# Patient Record
Sex: Male | Born: 1940 | Race: White | Hispanic: No | Marital: Married | Smoking: Former smoker
Health system: Southern US, Community
[De-identification: ages and names within clinical notes are randomized; demographics above are authoritative.]

## PROBLEM LIST (undated history)

## (undated) DIAGNOSIS — Z973 Presence of spectacles and contact lenses: Secondary | ICD-10-CM

## (undated) DIAGNOSIS — R911 Solitary pulmonary nodule: Secondary | ICD-10-CM

## (undated) DIAGNOSIS — C801 Malignant (primary) neoplasm, unspecified: Secondary | ICD-10-CM

## (undated) DIAGNOSIS — I255 Ischemic cardiomyopathy: Secondary | ICD-10-CM

## (undated) DIAGNOSIS — M719 Bursopathy, unspecified: Secondary | ICD-10-CM

## (undated) DIAGNOSIS — Z720 Tobacco use: Secondary | ICD-10-CM

## (undated) DIAGNOSIS — J449 Chronic obstructive pulmonary disease, unspecified: Secondary | ICD-10-CM

## (undated) DIAGNOSIS — R001 Bradycardia, unspecified: Secondary | ICD-10-CM

## (undated) DIAGNOSIS — I779 Disorder of arteries and arterioles, unspecified: Secondary | ICD-10-CM

## (undated) DIAGNOSIS — E7800 Pure hypercholesterolemia, unspecified: Secondary | ICD-10-CM

## (undated) DIAGNOSIS — C3490 Malignant neoplasm of unspecified part of unspecified bronchus or lung: Secondary | ICD-10-CM

## (undated) DIAGNOSIS — L409 Psoriasis, unspecified: Secondary | ICD-10-CM

## (undated) DIAGNOSIS — I2510 Atherosclerotic heart disease of native coronary artery without angina pectoris: Secondary | ICD-10-CM

## (undated) HISTORY — DX: Tobacco use: Z72.0

## (undated) HISTORY — DX: Bursopathy, unspecified: M71.9

## (undated) HISTORY — DX: Psoriasis, unspecified: L40.9

## (undated) HISTORY — PX: COLONOSCOPY: SHX174

## (undated) HISTORY — PX: HERNIA REPAIR: SHX51

## (undated) MED FILL — Dexamethasone Sodium Phosphate Inj 100 MG/10ML: INTRAMUSCULAR | Qty: 1 | Status: AC

---

## 2005-09-26 ENCOUNTER — Ambulatory Visit (HOSPITAL_BASED_OUTPATIENT_CLINIC_OR_DEPARTMENT_OTHER): Admission: RE | Admit: 2005-09-26 | Discharge: 2005-09-26 | Payer: Self-pay | Admitting: Orthopedic Surgery

## 2014-07-19 ENCOUNTER — Other Ambulatory Visit: Payer: Self-pay | Admitting: Dermatology

## 2016-07-21 DIAGNOSIS — R0689 Other abnormalities of breathing: Secondary | ICD-10-CM | POA: Insufficient documentation

## 2019-08-31 ENCOUNTER — Institutional Professional Consult (permissible substitution): Payer: Self-pay | Admitting: Critical Care Medicine

## 2019-08-31 ENCOUNTER — Telehealth: Payer: Self-pay | Admitting: Pulmonary Disease

## 2019-08-31 NOTE — Telephone Encounter (Signed)
Spoke with the pt's spouse  She is calling as FYI to let us know that her daughter, Gerald [name], already gave Dr Valeta Harms pt's disc with his cxr on it  Pt's appt is 09/07/19

## 2019-09-05 NOTE — Telephone Encounter (Signed)
PCCM:  Patients daughter is my neighbor. She left her dad's CD in my mailbox at home. I have the disk.   Thanks  Garner Nash, DO [facility] 09/05/2019 8:12 AM

## 2019-09-07 ENCOUNTER — Ambulatory Visit (INDEPENDENT_AMBULATORY_CARE_PROVIDER_SITE_OTHER): Payer: Medicare Other | Admitting: Pulmonary Disease

## 2019-09-07 ENCOUNTER — Encounter: Payer: Self-pay | Admitting: Pulmonary Disease

## 2019-09-07 ENCOUNTER — Ambulatory Visit
Admission: RE | Admit: 2019-09-07 | Discharge: 2019-09-07 | Disposition: A | Discharge status: Home / Self Care | Payer: Self-pay | Source: Ambulatory Visit | Attending: Pulmonary Disease | Admitting: Pulmonary Disease

## 2019-09-07 ENCOUNTER — Other Ambulatory Visit: Payer: Self-pay

## 2019-09-07 VITALS — BP 122/82 | HR 74 | Temp 97.3°F | Ht 72.0 in | Wt 186.4 lb

## 2019-09-07 DIAGNOSIS — R911 Solitary pulmonary nodule: Secondary | ICD-10-CM

## 2019-09-07 DIAGNOSIS — J432 Centrilobular emphysema: Secondary | ICD-10-CM | POA: Diagnosis not present

## 2019-09-07 DIAGNOSIS — F17200 Nicotine dependence, unspecified, uncomplicated: Secondary | ICD-10-CM

## 2019-09-07 DIAGNOSIS — T17500A Unspecified foreign body in bronchus causing asphyxiation, initial encounter: Secondary | ICD-10-CM

## 2019-09-07 DIAGNOSIS — J9809 Other diseases of bronchus, not elsewhere classified: Secondary | ICD-10-CM | POA: Diagnosis not present

## 2019-09-07 DIAGNOSIS — J449 Chronic obstructive pulmonary disease, unspecified: Secondary | ICD-10-CM

## 2019-09-07 DIAGNOSIS — J90 Pleural effusion, not elsewhere classified: Secondary | ICD-10-CM

## 2019-09-07 NOTE — Patient Instructions (Addendum)
Thank you for visiting Dr. Valeta Harms at [facility]. Today we recommend the following: Orders Placed This Encounter  Procedures  . NM PET Image Initial (PI) Skull Base To Thigh   Samples of Stiolto today. Once we have your PET scan complete we will make a decision on pursuing bronchoscopy. Likely to be scheduled later on this month for video bronchoscopy with navigation and fiducial placement.  Return in about 6 weeks (around 10/19/2019) for with APP or Dr. Valeta Harms.  You must quit smoking or vaping. This is the single most important thing that you can do to improve your lung health.   S = Set a quit date. T = Tell family, friends, and the people around you that you plan to quit. A = Anticipate or plan ahead for the tough times you'll face while quitting. R = Remove cigarettes and other tobacco products from your home, car, and work T = Talk to [place] about getting help to quit  If you need help feel free to reach out to our office, [facility]: [phone], call 1-800-QUIT-NOW, or visit [email or web address]    Please do your part to reduce the spread of COVID-19.

## 2019-09-07 NOTE — Addendum Note (Signed)
Addended byCoralie Keens on: 09/07/2019 05:30 PM   Modules accepted: Orders

## 2019-09-07 NOTE — Progress Notes (Signed)
Synopsis: Referred in June 2021 for upper lobe pulmonary nodule by Mosetta Anis, MD  Subjective:   PATIENT ID: Gerald [name] GENDER: male DOB: [birth year], MRN: [id number]  Chief Complaint  Patient presents with  . Consult    lung nodule    This is an 79 year old gentleman with a past medical history of hypertension, longstanding tobacco abuse history.  Patient underwent CT chest completed at [facility] in [place].  There was a 13 mm right upper lobe spiculated pulmonary nodule concerning for malignancy.  Also found to have a moderate right-sided pleural effusion with lower lobe atelectasis and possible right lower lobe endobronchial mucous plugging versus endobronchial lesion.  Decision was made for referral to pulmonary for further evaluation.  This CAT scan was originally completed on 07/28/2019.  OV 09/07/2019: Patient only complains today of cough.  He has this regularly.  Unfortunately he is still smoking.  2 packs/day for greater than the past 40+ years.  Brother recently diagnosed with lung cancer.  Patient denies hemoptysis and has at least a 20 pound weight loss over the past few months.  Past Medical History:  Diagnosis Date  . Bursitis   . Psoriasis   . Tobacco abuse      Family History  Problem Relation Age of Onset  . Lung cancer Brother      History reviewed. No pertinent surgical history.  Social History   Socioeconomic History  . Marital status: Married    Spouse name: Not on file  . Number of children: Not on file  . Years of education: Not on file  . Highest education level: Not on file  Occupational History  . Not on file  Tobacco Use  . Smoking status: Current Every Day Smoker    Packs/day: 2.00    Types: Cigarettes  . Smokeless tobacco: Current User  Substance and Sexual Activity  . Alcohol use: Not on file  . Drug use: Not on file  . Sexual activity: Not on file  Other Topics Concern  . Not on file  Social History Narrative  .  Not on file   Social Determinants of Health   Financial Resource Strain:   . Difficulty of Paying Living Expenses:   Food Insecurity:   . Worried About Charity fundraiser in the Last Year:   . Arboriculturist in the Last Year:   Transportation Needs:   . Film/video editor (Medical):   Marland Kitchen Lack of Transportation (Non-Medical):   Physical Activity:   . Days of Exercise per Week:   . Minutes of Exercise per Session:   Stress:   . Feeling of Stress :   Social Connections:   . Frequency of Communication with Friends and Family:   . Frequency of Social Gatherings with Friends and Family:   . Attends Religious Services:   . Active Member of Clubs or Organizations:   . Attends Archivist Meetings:   Marland Kitchen Marital Status:   Intimate Partner Violence:   . Fear of Current or Ex-Partner:   . Emotionally Abused:   Marland Kitchen Physically Abused:   . Sexually Abused:      Not on File   Outpatient Medications Prior to Visit  Medication Sig Dispense Refill  . omega-3 acid ethyl esters (LOVAZA) 1 g capsule     . Omega-3 Fatty Acids (FISH OIL) 1000 MG CAPS Take by mouth.    . pravastatin (PRAVACHOL) 20 MG tablet Take by mouth.    Marland Kitchen  albuterol (VENTOLIN HFA) 108 (90 Base) MCG/ACT inhaler Inhale into the lungs.    Marland Kitchen amoxicillin-clavulanate (AUGMENTIN) 875-125 MG tablet     . aspirin 81 MG chewable tablet Chew by mouth.    . nystatin-triamcinolone (MYCOLOG II) cream      No facility-administered medications prior to visit.    Review of Systems  Constitutional: Positive for weight loss. Negative for chills, fever and malaise/fatigue.  HENT: Negative for hearing loss, sore throat and tinnitus.   Eyes: Negative for blurred vision and double vision.  Respiratory: Positive for cough. Negative for hemoptysis, sputum production, shortness of breath, wheezing and stridor.   Cardiovascular: Negative for chest pain, palpitations, orthopnea, leg swelling and PND.  Gastrointestinal: Negative for  abdominal pain, constipation, diarrhea, heartburn, nausea and vomiting.  Genitourinary: Negative for dysuria, hematuria and urgency.  Musculoskeletal: Negative for joint pain and myalgias.  Skin: Negative for itching and rash.  Neurological: Negative for dizziness, tingling, weakness and headaches.  Endo/Heme/Allergies: Negative for environmental allergies. Does not bruise/bleed easily.  Psychiatric/Behavioral: Negative for depression. The patient is not nervous/anxious and does not have insomnia.   All other systems reviewed and are negative.    Objective:  Physical Exam Vitals reviewed.  Constitutional:      General: He is not in acute distress.    Appearance: He is well-developed.  HENT:     Head: Normocephalic and atraumatic.     Mouth/Throat:     Pharynx: No oropharyngeal exudate.  Eyes:     Conjunctiva/sclera: Conjunctivae normal.     Pupils: Pupils are equal, round, and reactive to light.  Neck:     Vascular: No JVD.     Trachea: No tracheal deviation.     Comments: Loss of supraclavicular fat Cardiovascular:     Rate and Rhythm: Normal rate and regular rhythm.     Heart sounds: S1 normal and S2 normal.     Comments: Distant heart tones Pulmonary:     Effort: No tachypnea or accessory muscle usage.     Breath sounds: No stridor. Decreased breath sounds (throughout all lung fields) present. No wheezing, rhonchi or rales.     Comments: diminshed right base  Abdominal:     General: Bowel sounds are normal. There is no distension.     Palpations: Abdomen is soft.     Tenderness: There is no abdominal tenderness.  Musculoskeletal:        General: No deformity (muscle wasting ).  Skin:    General: Skin is warm and dry.     Capillary Refill: Capillary refill takes less than 2 seconds.     Findings: No rash.  Neurological:     Mental Status: He is alert and oriented to person, place, and time.  Psychiatric:        Behavior: Behavior normal.      Vitals:    09/07/19 1437  BP: 122/82  Pulse: 74  Temp: (!) 97.3 F (36.3 C)  TempSrc: Oral  SpO2: 97%  Weight: 186 lb 6.4 oz (84.6 kg)  Height: 6' (1.829 m)   97% on RA BMI Readings from Last 3 Encounters:  09/07/19 25.28 kg/m   Wt Readings from Last 3 Encounters:  09/07/19 186 lb 6.4 oz (84.6 kg)     CBC No results found for: WBC, RBC, HGB, HCT, PLT, MCV, MCH, MCHC, RDW, LYMPHSABS, MONOABS, EOSABS, BASOSABS   Chest Imaging: CT chest 07/25/2019: Completed at [facility] [place] [facility]. Images reviewed on disc brought to the office today.  Patient has 30 mm right upper lobe pulmonary spiculated nodule concerning for primary bronchogenic carcinoma. There is also a moderate right-sided pleural effusion with right lower lobe atelectasis and possible right lower lobe endobronchial lesion versus mucous plugging. The patient's images have been independently reviewed by me.    Media Information   Document Information  Photos  Ct from [facility] Right upper lobe pulmonary nodule   09/07/2019 14:13  Attached To:  Office Visit on 09/07/19 with Stevie Ertle, Octavio Graves, DO  Source Information  Otha Monical, Octavio Graves, DO  [facility]    The patient's images have been independently reviewed by me.     Pulmonary Functions Testing Results: No flowsheet data found.  FeNO: None   Pathology: none  Echocardiogram: none   Heart Catheterization: none     Assessment & Plan:     ICD-10-CM   1. Nodule of upper lobe of right lung  R91.1 NM PET Image Initial (PI) Skull Base To Thigh  2. Centrilobular emphysema ([place])  J43.2   3. Current smoker  F17.200   4. Mucus plugging of bronchi  J98.09   5. Chronic obstructive pulmonary disease, unspecified COPD type ([place])  J44.9   6. Pleural effusion on right  J90     Discussion:  This is an 79 year old gentleman longstanding tobacco abuse history greater than 80-pack-year history.  Patient found to have an incidental right upper lobe spiculated 13  mm pulmonary nodule with associated centrilobular emphysema concerning for primary bronchogenic carcinoma.  Also incidentally found there was a right moderate-sized pleural effusion on CT scan with right lower lobe mucous plugging versus endobronchial lesion.  Office-based bedside procedure: Bedside chest ultrasound completed today in the office with a small amount of right-sided pleural fluid present.  This appeared free-flowing and not enough to consider thoracentesis at this time. Images were stored in office based butterfly imaging software cloud system.  Plan: Nuclear medicine pet imaging to be completed as soon as possible. Pending the pet image if there is a lesion within the right lower lobe will need to consider bronchoscopy. As for the right upper lobe lesion this is concerning for primary bronchogenic carcinoma.  I suspect this will be PET avid. Pending the PET scan if PET avid would proceed with video bronchoscopy with navigation and fiducial placement for consideration of SBRT. Patient is a current smoker and has no desire to quit smoking.  Therefore is not a surgical candidate. Today in the office we discussed risk benefits and alternatives of proceeding with procedure. They accepted this risk at this time. Therefore after PET scan images are complete we will make recommendations regarding video bronchoscopy. Tentative bronchoscopy dates could be for June 22 or June 29 at [facility] endoscopy.     Current Outpatient Medications:  .  omega-3 acid ethyl esters (LOVAZA) 1 g capsule, , Disp: , Rfl:  .  Omega-3 Fatty Acids (FISH OIL) 1000 MG CAPS, Take by mouth., Disp: , Rfl:  .  pravastatin (PRAVACHOL) 20 MG tablet, Take by mouth., Disp: , Rfl:  .  albuterol (VENTOLIN HFA) 108 (90 Base) MCG/ACT inhaler, Inhale into the lungs., Disp: , Rfl:    Garner Nash, DO [facility] 09/07/2019 3:49 PM

## 2019-09-07 NOTE — H&P (View-Only) (Signed)
Synopsis: Referred in June 2021 for upper lobe pulmonary nodule by Mosetta Anis, MD  Subjective:   PATIENT ID: Gerald [name] GENDER: male DOB: Jul 23, 1940, MRN: [id number]  Chief Complaint  Patient presents with  . Consult    lung nodule    This is an 79 year old gentleman with a past medical history of hypertension, longstanding tobacco abuse history.  Patient underwent CT chest completed at [facility] in [place].  There was a 13 mm right upper lobe spiculated pulmonary nodule concerning for malignancy.  Also found to have a moderate right-sided pleural effusion with lower lobe atelectasis and possible right lower lobe endobronchial mucous plugging versus endobronchial lesion.  Decision was made for referral to pulmonary for further evaluation.  This CAT scan was originally completed on 07/28/2019.  OV 09/07/2019: Patient only complains today of cough.  He has this regularly.  Unfortunately he is still smoking.  2 packs/day for greater than the past 40+ years.  Brother recently diagnosed with lung cancer.  Patient denies hemoptysis and has at least a 20 pound weight loss over the past few months.  Past Medical History:  Diagnosis Date  . Bursitis   . Psoriasis   . Tobacco abuse      Family History  Problem Relation Age of Onset  . Lung cancer Brother      History reviewed. No pertinent surgical history.  Social History   Socioeconomic History  . Marital status: Married    Spouse name: Not on file  . Number of children: Not on file  . Years of education: Not on file  . Highest education level: Not on file  Occupational History  . Not on file  Tobacco Use  . Smoking status: Current Every Day Smoker    Packs/day: 2.00    Types: Cigarettes  . Smokeless tobacco: Current User  Substance and Sexual Activity  . Alcohol use: Not on file  . Drug use: Not on file  . Sexual activity: Not on file  Other Topics Concern  . Not on file  Social History Narrative  .  Not on file   Social Determinants of Health   Financial Resource Strain:   . Difficulty of Paying Living Expenses:   Food Insecurity:   . Worried About Charity fundraiser in the Last Year:   . Arboriculturist in the Last Year:   Transportation Needs:   . Film/video editor (Medical):   Marland Kitchen Lack of Transportation (Non-Medical):   Physical Activity:   . Days of Exercise per Week:   . Minutes of Exercise per Session:   Stress:   . Feeling of Stress :   Social Connections:   . Frequency of Communication with Friends and Family:   . Frequency of Social Gatherings with Friends and Family:   . Attends Religious Services:   . Active Member of Clubs or Organizations:   . Attends Archivist Meetings:   Marland Kitchen Marital Status:   Intimate Partner Violence:   . Fear of Current or Ex-Partner:   . Emotionally Abused:   Marland Kitchen Physically Abused:   . Sexually Abused:      Not on File   Outpatient Medications Prior to Visit  Medication Sig Dispense Refill  . omega-3 acid ethyl esters (LOVAZA) 1 g capsule     . Omega-3 Fatty Acids (FISH OIL) 1000 MG CAPS Take by mouth.    . pravastatin (PRAVACHOL) 20 MG tablet Take by mouth.    Marland Kitchen  albuterol (VENTOLIN HFA) 108 (90 Base) MCG/ACT inhaler Inhale into the lungs.    Marland Kitchen amoxicillin-clavulanate (AUGMENTIN) 875-125 MG tablet     . aspirin 81 MG chewable tablet Chew by mouth.    . nystatin-triamcinolone (MYCOLOG II) cream      No facility-administered medications prior to visit.    Review of Systems  Constitutional: Positive for weight loss. Negative for chills, fever and malaise/fatigue.  HENT: Negative for hearing loss, sore throat and tinnitus.   Eyes: Negative for blurred vision and double vision.  Respiratory: Positive for cough. Negative for hemoptysis, sputum production, shortness of breath, wheezing and stridor.   Cardiovascular: Negative for chest pain, palpitations, orthopnea, leg swelling and PND.  Gastrointestinal: Negative for  abdominal pain, constipation, diarrhea, heartburn, nausea and vomiting.  Genitourinary: Negative for dysuria, hematuria and urgency.  Musculoskeletal: Negative for joint pain and myalgias.  Skin: Negative for itching and rash.  Neurological: Negative for dizziness, tingling, weakness and headaches.  Endo/Heme/Allergies: Negative for environmental allergies. Does not bruise/bleed easily.  Psychiatric/Behavioral: Negative for depression. The patient is not nervous/anxious and does not have insomnia.   All other systems reviewed and are negative.    Objective:  Physical Exam Vitals reviewed.  Constitutional:      General: He is not in acute distress.    Appearance: He is well-developed.  HENT:     Head: Normocephalic and atraumatic.     Mouth/Throat:     Pharynx: No oropharyngeal exudate.  Eyes:     Conjunctiva/sclera: Conjunctivae normal.     Pupils: Pupils are equal, round, and reactive to light.  Neck:     Vascular: No JVD.     Trachea: No tracheal deviation.     Comments: Loss of supraclavicular fat Cardiovascular:     Rate and Rhythm: Normal rate and regular rhythm.     Heart sounds: S1 normal and S2 normal.     Comments: Distant heart tones Pulmonary:     Effort: No tachypnea or accessory muscle usage.     Breath sounds: No stridor. Decreased breath sounds (throughout all lung fields) present. No wheezing, rhonchi or rales.     Comments: diminshed right base  Abdominal:     General: Bowel sounds are normal. There is no distension.     Palpations: Abdomen is soft.     Tenderness: There is no abdominal tenderness.  Musculoskeletal:        General: No deformity (muscle wasting ).  Skin:    General: Skin is warm and dry.     Capillary Refill: Capillary refill takes less than 2 seconds.     Findings: No rash.  Neurological:     Mental Status: He is alert and oriented to person, place, and time.  Psychiatric:        Behavior: Behavior normal.      Vitals:    09/07/19 1437  BP: 122/82  Pulse: 74  Temp: (!) 97.3 F (36.3 C)  TempSrc: Oral  SpO2: 97%  Weight: 186 lb 6.4 oz (84.6 kg)  Height: 6' (1.829 m)   97% on RA BMI Readings from Last 3 Encounters:  09/07/19 25.28 kg/m   Wt Readings from Last 3 Encounters:  09/07/19 186 lb 6.4 oz (84.6 kg)     CBC No results found for: WBC, RBC, HGB, HCT, PLT, MCV, MCH, MCHC, RDW, LYMPHSABS, MONOABS, EOSABS, BASOSABS   Chest Imaging: CT chest 07/25/2019: Completed at [facility] [place] [facility]. Images reviewed on disc brought to the office today.  Patient has 30 mm right upper lobe pulmonary spiculated nodule concerning for primary bronchogenic carcinoma. There is also a moderate right-sided pleural effusion with right lower lobe atelectasis and possible right lower lobe endobronchial lesion versus mucous plugging. The patient's images have been independently reviewed by me.    Media Information   Document Information  Photos  Ct from [facility] Right upper lobe pulmonary nodule   09/07/2019 14:13  Attached To:  Office Visit on 09/07/19 with Gaylon Melchor, Octavio Graves, DO  Source Information  Tyquavious Gamel, Octavio Graves, DO  [facility]    The patient's images have been independently reviewed by me.     Pulmonary Functions Testing Results: No flowsheet data found.  FeNO: None   Pathology: none  Echocardiogram: none   Heart Catheterization: none     Assessment & Plan:     ICD-10-CM   1. Nodule of upper lobe of right lung  R91.1 NM PET Image Initial (PI) Skull Base To Thigh  2. Centrilobular emphysema ([place])  J43.2   3. Current smoker  F17.200   4. Mucus plugging of bronchi  J98.09   5. Chronic obstructive pulmonary disease, unspecified COPD type ([place])  J44.9   6. Pleural effusion on right  J90     Discussion:  This is an 79 year old gentleman longstanding tobacco abuse history greater than 80-pack-year history.  Patient found to have an incidental right upper lobe spiculated 13  mm pulmonary nodule with associated centrilobular emphysema concerning for primary bronchogenic carcinoma.  Also incidentally found there was a right moderate-sized pleural effusion on CT scan with right lower lobe mucous plugging versus endobronchial lesion.  Office-based bedside procedure: Bedside chest ultrasound completed today in the office with a small amount of right-sided pleural fluid present.  This appeared free-flowing and not enough to consider thoracentesis at this time. Images were stored in office based butterfly imaging software cloud system.  Plan: Nuclear medicine pet imaging to be completed as soon as possible. Pending the pet image if there is a lesion within the right lower lobe will need to consider bronchoscopy. As for the right upper lobe lesion this is concerning for primary bronchogenic carcinoma.  I suspect this will be PET avid. Pending the PET scan if PET avid would proceed with video bronchoscopy with navigation and fiducial placement for consideration of SBRT. Patient is a current smoker and has no desire to quit smoking.  Therefore is not a surgical candidate. Today in the office we discussed risk benefits and alternatives of proceeding with procedure. They accepted this risk at this time. Therefore after PET scan images are complete we will make recommendations regarding video bronchoscopy. Tentative bronchoscopy dates could be for June 22 or June 29 at [facility] endoscopy.     Current Outpatient Medications:  .  omega-3 acid ethyl esters (LOVAZA) 1 g capsule, , Disp: , Rfl:  .  Omega-3 Fatty Acids (FISH OIL) 1000 MG CAPS, Take by mouth., Disp: , Rfl:  .  pravastatin (PRAVACHOL) 20 MG tablet, Take by mouth., Disp: , Rfl:  .  albuterol (VENTOLIN HFA) 108 (90 Base) MCG/ACT inhaler, Inhale into the lungs., Disp: , Rfl:    Garner Nash, DO [facility] 09/07/2019 3:49 PM

## 2019-09-12 ENCOUNTER — Other Ambulatory Visit: Payer: Self-pay

## 2019-09-12 ENCOUNTER — Telehealth: Payer: Self-pay | Admitting: Pulmonary Disease

## 2019-09-12 ENCOUNTER — Ambulatory Visit (HOSPITAL_COMMUNITY)
Admission: RE | Admit: 2019-09-12 | Discharge: 2019-09-12 | Disposition: A | Discharge status: Home / Self Care | Payer: Medicare Other | Source: Ambulatory Visit | Attending: Pulmonary Disease | Admitting: Pulmonary Disease

## 2019-09-12 DIAGNOSIS — J439 Emphysema, unspecified: Secondary | ICD-10-CM | POA: Diagnosis not present

## 2019-09-12 DIAGNOSIS — E041 Nontoxic single thyroid nodule: Secondary | ICD-10-CM | POA: Diagnosis not present

## 2019-09-12 DIAGNOSIS — R911 Solitary pulmonary nodule: Secondary | ICD-10-CM

## 2019-09-12 DIAGNOSIS — I700 Atherosclerosis of aorta: Secondary | ICD-10-CM | POA: Insufficient documentation

## 2019-09-12 DIAGNOSIS — I2510 Atherosclerotic heart disease of native coronary artery without angina pectoris: Secondary | ICD-10-CM | POA: Diagnosis not present

## 2019-09-12 DIAGNOSIS — I77810 Thoracic aortic ectasia: Secondary | ICD-10-CM | POA: Diagnosis not present

## 2019-09-12 LAB — GLUCOSE, CAPILLARY: Glucose-Capillary: 83 mg/dL (ref 70–99)

## 2019-09-12 MED ORDER — FLUDEOXYGLUCOSE F - 18 (FDG) INJECTION
9.4000 | Freq: Once | INTRAVENOUS | Status: AC | PRN
  Administered 2019-09-12: 9.4 via INTRAVENOUS

## 2019-09-12 NOTE — Telephone Encounter (Signed)
PCCM:  Called and discussed nuclear medicine pet imaging results with patient's daughter.  Attempted to get a hold of the patient however they are currently driving back to their home in [place].  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  We will follow through with the plan set forth in the office visit the other day.  Super D CT imaging will need to complete   Also plan for navigational bronchoscopy and fiducial placement.  Plan for SBRT.  Bronchoscopy possibly 09/27/2019  Garner Nash, DO [facility] 09/12/2019 6:08 PM

## 2019-09-13 ENCOUNTER — Telehealth: Payer: Self-pay | Admitting: Pulmonary Disease

## 2019-09-13 NOTE — Telephone Encounter (Signed)
Pt has been notified of the following:  Super D CT at [facility] on 6/24 @ 4:00 COVID Test 6/26 @ 11:45 - [place] Test Site BNB-6/29 @ 7:30 AM  Pt would like to know if it is possible to get COVID test done on date of procedure.  I explained that this needs to done 3 days prior.  BI - is there anything that you can do to help the pt get sched on the date of the procedure d/t the distance?

## 2019-09-13 NOTE — Telephone Encounter (Signed)
PCCM:  I called and spoke with the patient and wife.  Pre-op orders placed and labs   All questions answered.   Garner Nash, DO [facility] 09/13/2019 12:50 PM

## 2019-09-14 NOTE — Telephone Encounter (Signed)
Super D orders placed by Dr Valeta Harms.  Bronchoscopy scheduled for 09/27/19.  Nothing further at this time.

## 2019-09-22 ENCOUNTER — Other Ambulatory Visit: Payer: Self-pay

## 2019-09-22 ENCOUNTER — Ambulatory Visit (HOSPITAL_COMMUNITY)
Admission: RE | Admit: 2019-09-22 | Discharge: 2019-09-22 | Disposition: A | Discharge status: Home / Self Care | Payer: Medicare Other | Source: Ambulatory Visit | Attending: Pulmonary Disease | Admitting: Pulmonary Disease

## 2019-09-22 DIAGNOSIS — R911 Solitary pulmonary nodule: Secondary | ICD-10-CM | POA: Diagnosis not present

## 2019-09-23 ENCOUNTER — Encounter (HOSPITAL_COMMUNITY): Payer: Self-pay | Admitting: Pulmonary Disease

## 2019-09-24 ENCOUNTER — Other Ambulatory Visit (HOSPITAL_COMMUNITY)
Admission: RE | Admit: 2019-09-24 | Discharge: 2019-09-24 | Disposition: A | Discharge status: Home / Self Care | Payer: Medicare Other | Source: Ambulatory Visit | Attending: Pulmonary Disease | Admitting: Pulmonary Disease

## 2019-09-24 DIAGNOSIS — Z01812 Encounter for preprocedural laboratory examination: Secondary | ICD-10-CM | POA: Diagnosis present

## 2019-09-24 DIAGNOSIS — Z20822 Contact with and (suspected) exposure to covid-19: Secondary | ICD-10-CM | POA: Insufficient documentation

## 2019-09-24 LAB — SARS CORONAVIRUS 2 (TAT 6-24 HRS): SARS Coronavirus 2: NEGATIVE

## 2019-09-26 ENCOUNTER — Encounter (HOSPITAL_COMMUNITY): Payer: Self-pay | Admitting: Pulmonary Disease

## 2019-09-26 ENCOUNTER — Other Ambulatory Visit: Payer: Self-pay

## 2019-09-26 NOTE — Anesthesia Preprocedure Evaluation (Addendum)
Anesthesia Evaluation  Patient identified by MRN, date of birth, ID band Patient awake    Reviewed: Allergy & Precautions, NPO status , Patient's Chart, lab work & pertinent test results  Airway Mallampati: II  TM Distance: >3 FB Neck ROM: Full    Dental  (+) Teeth Intact, Dental Advisory Given   Pulmonary COPD, Current Smoker,  LUNG NODULE RIGHT UPPER LOBE   Pulmonary exam normal breath sounds clear to auscultation       Cardiovascular + Peripheral Vascular Disease   Rhythm:Regular Rate:Bradycardia     Neuro/Psych negative neurological ROS  negative psych ROS   GI/Hepatic negative GI ROS, Neg liver ROS,   Endo/Other  negative endocrine ROS  Renal/GU negative Renal ROS     Musculoskeletal negative musculoskeletal ROS (+)   Abdominal   Peds  Hematology negative hematology ROS (+)   Anesthesia Other Findings Day of surgery medications reviewed with the patient.  Reproductive/Obstetrics                             Anesthesia Physical Anesthesia Plan  ASA: III  Anesthesia Plan: General   Post-op Pain Management:    Induction: Intravenous  PONV Risk Score and Plan: 1 and Ondansetron and Treatment may vary due to age or medical condition  Airway Management Planned: Oral ETT and LMA  Additional Equipment:   Intra-op Plan:   Post-operative Plan: Extubation in OR  Informed Consent: I have reviewed the patients History and Physical, chart, labs and discussed the procedure including the risks, benefits and alternatives for the proposed anesthesia with the patient or authorized representative who has indicated his/her understanding and acceptance.     Dental advisory given  Plan Discussed with: CRNA  Anesthesia Plan Comments: (PAT note written 09/26/2019 by Myra Gianotti, PA-C. SAME DAY WORK-UP   )       Anesthesia Quick Evaluation

## 2019-09-26 NOTE — Progress Notes (Signed)
Anesthesia Chart Review: SAME DAY WORK-UP   Case: [id number] Date/Time: 09/27/19 0730   Procedure: VIDEO BRONCHOSCOPY WITH ENDOBRONCHIAL NAVIGATION (N/A )   Anesthesia type: General   Pre-op diagnosis: LUNG NODULE RIGHT UPPER LOBE   Location: [place] [facility]   Surgeons: Garner Nash, DO      DISCUSSION: Patient is an 79 year old male scheduled for the above procedure. He has a hypermetabolic RUL pulmonary nodule. 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 Stable loculated right pleural effusion noted on 09/22/19 chest CT. (At 09/07/19 visit with Dr. Valeta Harms, he performed bedside chest [place] and noted "free-flowing" pleural fluid and "not enough to consider thoracentesis at this time."  History includes smoking, lung nodule, COPD, hypercholesterolemia, psoriasis, carotid artery stenosis (stable 62-69% LICA stenosis, < 48% RICA 10/25/18).  09/24/19 presurgical COVID-19 test negative. He is a same day work-up, so he is for labs and anesthesia team evaluation on the day of surgery. If no EKG within past year then would also plan for EKG on arrival.    VS: On 09/07/19, BP 122/82, HR 74, WT 84.6 KG    PROVIDERS: Olam Idler, MD is PCP ([place]) Dorrene German, MD is vascular surgeon. ([place]). It appears that next follow-up visit is scheduled for 11/01/19.    LABS: For day of surgery. As of 07/21/19, H/H 16.7/51.1, PLT 207K, glucose 78, AST 18, ALT 9, Cr 0.96.    IMAGES: CT Super D Chest 09/22/19 (ordered by June Leap, DO): IMPRESSION: 1. Stable appearance of spiculated, FDG avid nodule within the right upper lobe suspicious for primary bronchogenic carcinoma. 2. Stable loculated right pleural effusion with overlying areas of rounded atelectasis. 3. Emphysema and aortic atherosclerosis. 4. Left main and 3 vessel coronary artery calcifications noted. 5. Gallstones. 6. Right  lobe of thyroid gland nodule measures 4 cm. Recommend thyroid US (ref: J Am Coll Radiol. 2015 Feb;12(2): 143-50).  PET Scan 09/12/19 (ordered by June Leap, DO): IMPRESSION: 1. Hypermetabolic slightly spiculated solid 1.5 cm right upper lobe pulmonary nodule, compatible with primary bronchogenic carcinoma. 2. No hypermetabolic thoracic adenopathy or distant metastatic disease. 3. Right thyroid 4.1 cm partially calcified non hypermetabolic nodule. Recommend thyroid US (ref: J Am Coll Radiol. 2015 Feb;12(2): 143-50). 4. 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 Recommend annual imaging followup by CTA or MRA. This recommendation follows 2010 ACCF/AHA/AATS/ACR/ASA/SCA/SCAI/SIR/STS/SVM Guidelines for the Diagnosis and Management of Patients with Thoracic Aortic Disease. Circulation. 2010; 121: N462-V035. Aortic aneurysm NOS (ICD10-I71.9). 5. Aortic Atherosclerosis (ICD10-I70.0) and Emphysema (ICD10-J43.9).   EKG: No EKG seen in [facility] or Care Everywhere.   CV: Carotid US 10/25/18 ([facility]): Previous: Previous exam performed on 10/15/2017 demonstrated a 60-79% left ICA stenosis with maximum velocities of193.80 cm/s PSV/58.93 cm/s EDV.  Right: No significant focal increase in ICA velocities.  Left: Moderate focal increase in ICA velocities with post stenotic turbulence.  Conclusions: RIGHT: No hemodynamically significant ICA stenosis, consistent with <60%.  LEFT: Moderate ICA stenosis, consistent with 60-79%.  Compared to the previous exam, these results remain essentially unchanged.   Stress Echo 03/23/12 ([place]): Conclusion: Normal wall motion response to exercise.            Negative for ischemia by ST criteria.  Disposition:  Limited by dyspnea likely c/w history of continued smoking.     Past Medical History:  Diagnosis Date  . Bursitis   . Carotid artery disease ([place])   .  COPD (chronic obstructive pulmonary disease) ([place])   . Hypercholesterolemia    . Lung nodule   . Psoriasis   . Tobacco abuse   . Wears glasses    reading    Past Surgical History:  Procedure Laterality Date  . COLONOSCOPY    . HERNIA REPAIR      MEDICATIONS: No current facility-administered medications for this encounter.   Marland Kitchen acitretin (SORIATANE) 25 MG capsule  . albuterol (VENTOLIN HFA) 108 (90 Base) MCG/ACT inhaler  . clobetasol cream (TEMOVATE) 0.05 %  . Multiple Vitamins-Minerals (ICAPS) TABS  . Omega-3 Fatty Acids (FISH OIL) 1000 MG CAPS  . pravastatin (PRAVACHOL) 20 MG tablet    Myra Gianotti, PA-C Surgical Short Stay/Anesthesiology [facility] Phone [phone] [facility] Phone [phone] 09/26/2019 1:22 PM

## 2019-09-26 NOTE — Progress Notes (Signed)
Pt denies SOB, chest pain, and being under the care of a cardiologist. Pt stated that PCP is Dr. Mosetta Anis.  Pt denies having a cardiac cath. Pt denies having an EKG. Pt denies recent labs. Pt requested that spouse, Darlene (DPR), complete the pre-op assessment. Spouse made aware to have pt stop taking Aspirin (unless otherwise advised by surgeon), vitamins, fish oil and herbal medications. Do not take any NSAIDs ie: Ibuprofen, Advil, Naproxen (Aleve), Motrin, BC and Goody Powder. Spouse reminded to have pt quarantine. Spouse verbalized understanding of all pre-op instructions. PA, Anesthesiology, asked to review pt history; see note.

## 2019-09-27 ENCOUNTER — Ambulatory Visit (HOSPITAL_COMMUNITY): Payer: Medicare Other

## 2019-09-27 ENCOUNTER — Ambulatory Visit (HOSPITAL_COMMUNITY): Payer: Medicare Other | Admitting: Vascular Surgery

## 2019-09-27 ENCOUNTER — Ambulatory Visit (HOSPITAL_COMMUNITY)
Admission: RE | Admit: 2019-09-27 | Discharge: 2019-09-27 | Disposition: A | Discharge status: Home / Self Care | Payer: Medicare Other | Attending: Pulmonary Disease | Admitting: Pulmonary Disease

## 2019-09-27 ENCOUNTER — Encounter (HOSPITAL_COMMUNITY)
Admission: RE | Disposition: A | Discharge status: Home / Self Care | Payer: Self-pay | Source: Home / Self Care | Attending: Pulmonary Disease

## 2019-09-27 ENCOUNTER — Encounter (HOSPITAL_COMMUNITY): Payer: Self-pay | Admitting: Pulmonary Disease

## 2019-09-27 DIAGNOSIS — F17210 Nicotine dependence, cigarettes, uncomplicated: Secondary | ICD-10-CM | POA: Insufficient documentation

## 2019-09-27 DIAGNOSIS — Z79899 Other long term (current) drug therapy: Secondary | ICD-10-CM | POA: Insufficient documentation

## 2019-09-27 DIAGNOSIS — I2584 Coronary atherosclerosis due to calcified coronary lesion: Secondary | ICD-10-CM | POA: Diagnosis not present

## 2019-09-27 DIAGNOSIS — I739 Peripheral vascular disease, unspecified: Secondary | ICD-10-CM | POA: Diagnosis not present

## 2019-09-27 DIAGNOSIS — C3431 Malignant neoplasm of lower lobe, right bronchus or lung: Secondary | ICD-10-CM | POA: Insufficient documentation

## 2019-09-27 DIAGNOSIS — Z792 Long term (current) use of antibiotics: Secondary | ICD-10-CM | POA: Insufficient documentation

## 2019-09-27 DIAGNOSIS — J9809 Other diseases of bronchus, not elsewhere classified: Secondary | ICD-10-CM | POA: Insufficient documentation

## 2019-09-27 DIAGNOSIS — J432 Centrilobular emphysema: Secondary | ICD-10-CM | POA: Diagnosis not present

## 2019-09-27 DIAGNOSIS — L409 Psoriasis, unspecified: Secondary | ICD-10-CM | POA: Insufficient documentation

## 2019-09-27 DIAGNOSIS — R911 Solitary pulmonary nodule: Secondary | ICD-10-CM

## 2019-09-27 DIAGNOSIS — Z9889 Other specified postprocedural states: Secondary | ICD-10-CM

## 2019-09-27 DIAGNOSIS — J90 Pleural effusion, not elsewhere classified: Secondary | ICD-10-CM | POA: Diagnosis not present

## 2019-09-27 DIAGNOSIS — I2510 Atherosclerotic heart disease of native coronary artery without angina pectoris: Secondary | ICD-10-CM | POA: Insufficient documentation

## 2019-09-27 DIAGNOSIS — Z801 Family history of malignant neoplasm of trachea, bronchus and lung: Secondary | ICD-10-CM | POA: Diagnosis not present

## 2019-09-27 DIAGNOSIS — C3411 Malignant neoplasm of upper lobe, right bronchus or lung: Secondary | ICD-10-CM | POA: Diagnosis not present

## 2019-09-27 DIAGNOSIS — E7800 Pure hypercholesterolemia, unspecified: Secondary | ICD-10-CM | POA: Diagnosis not present

## 2019-09-27 DIAGNOSIS — I10 Essential (primary) hypertension: Secondary | ICD-10-CM | POA: Insufficient documentation

## 2019-09-27 DIAGNOSIS — Z7982 Long term (current) use of aspirin: Secondary | ICD-10-CM | POA: Insufficient documentation

## 2019-09-27 HISTORY — PX: BRONCHIAL BRUSHINGS: SHX5108

## 2019-09-27 HISTORY — DX: Pure hypercholesterolemia, unspecified: E78.00

## 2019-09-27 HISTORY — DX: Chronic obstructive pulmonary disease, unspecified: J44.9

## 2019-09-27 HISTORY — DX: Presence of spectacles and contact lenses: Z97.3

## 2019-09-27 HISTORY — PX: HEMOSTASIS CONTROL: SHX6838

## 2019-09-27 HISTORY — PX: BRONCHIAL NEEDLE ASPIRATION BIOPSY: SHX5106

## 2019-09-27 HISTORY — PX: CRYOTHERAPY: SHX6894

## 2019-09-27 HISTORY — DX: Disorder of arteries and arterioles, unspecified: I77.9

## 2019-09-27 HISTORY — PX: FIDUCIAL MARKER PLACEMENT: SHX6858

## 2019-09-27 HISTORY — PX: VIDEO BRONCHOSCOPY WITH ENDOBRONCHIAL NAVIGATION: SHX6175

## 2019-09-27 HISTORY — DX: Solitary pulmonary nodule: R91.1

## 2019-09-27 HISTORY — PX: BRONCHIAL BIOPSY: SHX5109

## 2019-09-27 HISTORY — PX: BRONCHIAL WASHINGS: SHX5105

## 2019-09-27 LAB — COMPREHENSIVE METABOLIC PANEL
ALT: 11 U/L (ref 0–44)
AST: 14 U/L — ABNORMAL LOW (ref 15–41)
Albumin: 3.5 g/dL (ref 3.5–5.0)
Alkaline Phosphatase: 60 U/L (ref 38–126)
Anion gap: 9 (ref 5–15)
BUN: 14 mg/dL (ref 8–23)
CO2: 29 mmol/L (ref 22–32)
Calcium: 9.1 mg/dL (ref 8.9–10.3)
Chloride: 100 mmol/L (ref 98–111)
Creatinine, Ser: 1.11 mg/dL (ref 0.61–1.24)
GFR calc Af Amer: >60 mL/min (ref >60)
GFR calc non Af Amer: >60 mL/min (ref >60)
Glucose, Bld: 104 mg/dL — ABNORMAL HIGH (ref 70–99)
Potassium: 4.1 mmol/L (ref 3.5–5.1)
Sodium: 138 mmol/L (ref 135–145)
Total Bilirubin: 0.7 mg/dL (ref 0.3–1.2)
Total Protein: 6.5 g/dL (ref 6.5–8.1)

## 2019-09-27 LAB — CBC
HCT: 53.7 % — ABNORMAL HIGH (ref 39.0–52.0)
Hemoglobin: 17.3 g/dL — ABNORMAL HIGH (ref 13.0–17.0)
MCH: 31 pg (ref 26.0–34.0)
MCHC: 32.2 g/dL (ref 30.0–36.0)
MCV: 96.2 fL (ref 80.0–100.0)
Platelets: 180 10*3/uL (ref 150–400)
RBC: 5.58 MIL/uL (ref 4.22–5.81)
RDW: 13.6 % (ref 11.5–15.5)
WBC: 8 10*3/uL (ref 4.0–10.5)
nRBC: 0 % (ref 0.0–0.2)

## 2019-09-27 LAB — APTT: aPTT: 32 seconds (ref 24–36)

## 2019-09-27 LAB — PROTIME-INR
INR: 1 (ref 0.8–1.2)
Prothrombin Time: 12.7 seconds (ref 11.4–15.2)

## 2019-09-27 SURGERY — VIDEO BRONCHOSCOPY WITH ENDOBRONCHIAL NAVIGATION
Anesthesia: General

## 2019-09-27 MED ORDER — FENTANYL CITRATE (PF) 100 MCG/2ML IJ SOLN: 25.0000 ug | INTRAMUSCULAR | Status: DC | PRN

## 2019-09-27 MED ORDER — ONDANSETRON HCL 4 MG/2ML IJ SOLN
INTRAMUSCULAR | Status: DC | PRN
  Administered 2019-09-27: 4 mg via INTRAVENOUS

## 2019-09-27 MED ORDER — GLYCOPYRROLATE PF 0.2 MG/ML IJ SOSY
PREFILLED_SYRINGE | INTRAMUSCULAR | Status: DC | PRN
  Administered 2019-09-27: .2 mg via INTRAVENOUS

## 2019-09-27 MED ORDER — LIDOCAINE 2% (20 MG/ML) 5 ML SYRINGE
INTRAMUSCULAR | Status: DC | PRN
  Administered 2019-09-27: 60 mg via INTRAVENOUS
  Administered 2019-09-27: 80 mg via INTRAVENOUS

## 2019-09-27 MED ORDER — EPINEPHRINE PF 1 MG/ML IJ SOLN: INTRAMUSCULAR | Status: DC | PRN

## 2019-09-27 MED ORDER — PHENYLEPHRINE HCL (PRESSORS) 10 MG/ML IV SOLN
INTRAVENOUS | Status: DC | PRN
Start: 2019-09-27 — End: 2019-09-27
  Administered 2019-09-27: 120 ug via INTRAVENOUS

## 2019-09-27 MED ORDER — ONDANSETRON HCL 4 MG/2ML IJ SOLN: 4.0000 mg | Freq: Once | INTRAMUSCULAR | Status: DC | PRN

## 2019-09-27 MED ORDER — SUGAMMADEX SODIUM 200 MG/2ML IV SOLN
INTRAVENOUS | Status: DC | PRN
Start: 2019-09-27 — End: 2019-09-27
  Administered 2019-09-27: 200 mg via INTRAVENOUS

## 2019-09-27 MED ORDER — SODIUM CHLORIDE (PF) 0.9 % IJ SOLN
PREFILLED_SYRINGE | INTRAMUSCULAR | Status: DC | PRN
  Administered 2019-09-27: 2 mL

## 2019-09-27 MED ORDER — PHENYLEPHRINE HCL (PRESSORS) 10 MG/ML IV SOLN
INTRAVENOUS | Status: DC | PRN
Start: 2019-09-27 — End: 2019-09-27
  Administered 2019-09-27: 40 ug via INTRAVENOUS
  Administered 2019-09-27: 80 ug via INTRAVENOUS

## 2019-09-27 MED ORDER — DEXAMETHASONE SODIUM PHOSPHATE 10 MG/ML IJ SOLN
INTRAMUSCULAR | Status: DC | PRN
Start: 2019-09-27 — End: 2019-09-27
  Administered 2019-09-27: 5 mg via INTRAVENOUS

## 2019-09-27 MED ORDER — PHENYLEPHRINE HCL-NACL 10-0.9 MG/250ML-% IV SOLN
INTRAVENOUS | Status: DC | PRN
Start: 2019-09-27 — End: 2019-09-27
  Administered 2019-09-27: 25 ug/min via INTRAVENOUS

## 2019-09-27 MED ORDER — PROPOFOL 10 MG/ML IV BOLUS
INTRAVENOUS | Status: DC | PRN
  Administered 2019-09-27: 100 mg via INTRAVENOUS

## 2019-09-27 MED ORDER — FENTANYL CITRATE (PF) 100 MCG/2ML IJ SOLN
INTRAMUSCULAR | Status: DC | PRN
  Administered 2019-09-27 (×3): 50 ug via INTRAVENOUS

## 2019-09-27 MED ORDER — LACTATED RINGERS IV SOLN: INTRAVENOUS | Status: DC | PRN

## 2019-09-27 MED ORDER — ROCURONIUM BROMIDE 10 MG/ML (PF) SYRINGE
PREFILLED_SYRINGE | INTRAVENOUS | Status: DC | PRN
  Administered 2019-09-27: 10 mg via INTRAVENOUS
  Administered 2019-09-27: 60 mg via INTRAVENOUS

## 2019-09-27 SURGICAL SUPPLY — 48 items
ADAPTER BRONCH F/PENTAX (ADAPTER) ×4 IMPLANT
ADAPTER VALVE BIOPSY EBUS (MISCELLANEOUS) IMPLANT
ADPR BSCP EDG PNTX (ADAPTER) ×2
ADPTR VALVE BIOPSY EBUS (MISCELLANEOUS)
BRUSH CYTOL CELLEBRITY 1.5X140 (MISCELLANEOUS) ×4 IMPLANT
BRUSH SUPERTRAX BIOPSY (INSTRUMENTS) IMPLANT
BRUSH SUPERTRAX NDL-TIP CYTO (INSTRUMENTS) ×4 IMPLANT
CANISTER SUCT 3000ML PPV (MISCELLANEOUS) ×4 IMPLANT
CHANNEL WORK EXTEND EDGE 180 (KITS) IMPLANT
CHANNEL WORK EXTEND EDGE 45 (KITS) IMPLANT
CHANNEL WORK EXTEND EDGE 90 (KITS) IMPLANT
CONT SPEC 4OZ CLIKSEAL STRL BL (MISCELLANEOUS) ×4 IMPLANT
COVER BACK TABLE 60X90IN (DRAPES) ×4 IMPLANT
FILTER STRAW FLUID ASPIR (MISCELLANEOUS) IMPLANT
FORCEPS BIOP SUPERTRX PREMAR (INSTRUMENTS) ×4 IMPLANT
GAUZE SPONGE 4X4 12PLY STRL (GAUZE/BANDAGES/DRESSINGS) ×4 IMPLANT
GLOVE SURG SS PI 7.5 STRL IVOR (GLOVE) ×8 IMPLANT
GOWN STRL REUS W/ TWL LRG LVL3 (GOWN DISPOSABLE) ×4 IMPLANT
GOWN STRL REUS W/TWL LRG LVL3 (GOWN DISPOSABLE) ×8
KIT CLEAN ENDO COMPLIANCE (KITS) ×4 IMPLANT
KIT LOCATABLE GUIDE (CANNULA) IMPLANT
KIT MARKER FIDUCIAL DELIVERY (KITS) IMPLANT
KIT PROCEDURE EDGE 180 (KITS) IMPLANT
KIT PROCEDURE EDGE 45 (KITS) IMPLANT
KIT PROCEDURE EDGE 90 (KITS) IMPLANT
KIT TURNOVER KIT B (KITS) ×4 IMPLANT
MARKER SKIN DUAL TIP RULER LAB (MISCELLANEOUS) ×4 IMPLANT
NDL SUPERTRX PREMARK BIOPSY (NEEDLE) ×2 IMPLANT
NEEDLE SUPERTRX PREMARK BIOPSY (NEEDLE) ×4 IMPLANT
NS IRRIG 1000ML POUR BTL (IV SOLUTION) ×4 IMPLANT
OIL SILICONE PENTAX (PARTS (SERVICE/REPAIRS)) ×4 IMPLANT
PAD ARMBOARD 7.5X6 YLW CONV (MISCELLANEOUS) ×8 IMPLANT
PATCHES PATIENT (LABEL) ×12 IMPLANT
SOL ANTI FOG 6CC (MISCELLANEOUS) ×2 IMPLANT
SOLUTION ANTI FOG 6CC (MISCELLANEOUS) ×2
SYR 20CC LL (SYRINGE) ×4 IMPLANT
SYR 20ML ECCENTRIC (SYRINGE) ×4 IMPLANT
SYR 50ML SLIP (SYRINGE) ×4 IMPLANT
TOWEL OR 17X24 6PK STRL BLUE (TOWEL DISPOSABLE) ×4 IMPLANT
TRAP SPECIMEN MUCOUS 40CC (MISCELLANEOUS) IMPLANT
TUBE CONNECTING 20'X1/4 (TUBING) ×1
TUBE CONNECTING 20X1/4 (TUBING) ×3 IMPLANT
UNDERPAD 30X30 (UNDERPADS AND DIAPERS) ×4 IMPLANT
VALVE BIOPSY  SINGLE USE (MISCELLANEOUS) ×4
VALVE BIOPSY SINGLE USE (MISCELLANEOUS) ×2 IMPLANT
VALVE SUCTION BRONCHIO DISP (MISCELLANEOUS) ×4 IMPLANT
WATER STERILE IRR 1000ML POUR (IV SOLUTION) ×4 IMPLANT
superlock fiducial marker ×6 IMPLANT

## 2019-09-27 NOTE — Anesthesia Postprocedure Evaluation (Signed)
Anesthesia Post Note  Patient: Ezel Vallone  Procedure(s) Performed: VIDEO BRONCHOSCOPY WITH ENDOBRONCHIAL NAVIGATION (N/A ) BRONCHIAL BRUSHINGS BRONCHIAL NEEDLE ASPIRATION BIOPSIES BRONCHIAL BIOPSIES FIDUCIAL MARKER PLACEMENT BRONCHIAL WASHINGS CRYOTHERAPY     Patient location during evaluation: Endoscopy Anesthesia Type: General Level of consciousness: awake and alert Pain management: pain level controlled Vital Signs Assessment: post-procedure vital signs reviewed and stable Respiratory status: spontaneous breathing, nonlabored ventilation, respiratory function stable and patient connected to nasal cannula oxygen Cardiovascular status: blood pressure returned to baseline and stable Postop Assessment: no apparent nausea or vomiting Anesthetic complications: no   No complications documented.  Last Vitals:  Vitals:   09/27/19 1015 09/27/19 1030  BP: (!) 106/55 116/60  Pulse: 61 73  Resp: 16 20  Temp:  (!) 36.1 C  SpO2: 100% 100%    Last Pain:  Vitals:   09/27/19 1030  PainSc: 0-No pain                 Catalina Gravel

## 2019-09-27 NOTE — Transfer of Care (Signed)
Immediate Anesthesia Transfer of Care Note  Patient: Gerald [name]  Procedure(s) Performed: VIDEO BRONCHOSCOPY WITH ENDOBRONCHIAL NAVIGATION (N/A ) BRONCHIAL BRUSHINGS BRONCHIAL NEEDLE ASPIRATION BIOPSIES BRONCHIAL BIOPSIES FIDUCIAL MARKER PLACEMENT BRONCHIAL WASHINGS CRYOTHERAPY  Patient Location: PACU  Anesthesia Type:General  Level of Consciousness: awake, alert  and oriented  Airway & Oxygen Therapy: Patient Spontanous Breathing and Patient connected to face mask oxygen  Post-op Assessment: Report given to RN, Post -op Vital signs reviewed and stable and Patient moving all extremities X 4  Post vital signs: Reviewed and stable  Last Vitals:  Vitals Value Taken Time  BP 115/75 09/27/19 0935  Temp    Pulse 76 09/27/19 0935  Resp 48 09/27/19 0935  SpO2 97 % 09/27/19 0935  Vitals shown include unvalidated device data.  Last Pain:  Vitals:   09/27/19 0655  PainSc: 0-No pain      Patients Stated Pain Goal: 3 (16/10/96 0454)  Complications: No complications documented.

## 2019-09-27 NOTE — Progress Notes (Signed)
Dr Gifford Shave informed patient's pulse 36 per monitor, counted pulse 39.  EKG obtained.  Dr Gifford Shave reviewed.  No order given.  [place] for surgery.

## 2019-09-27 NOTE — Interval H&P Note (Signed)
History and Physical Interval Note:  09/27/2019 7:04 AM  Gerald [name]  has presented today for surgery, with the diagnosis of LUNG NODULE RIGHT UPPER LOBE.  The various methods of treatment have been discussed with the patient and family. After consideration of risks, benefits and other options for treatment, the patient has consented to  Procedure(s): [place] (N/A) as a surgical intervention.  The patient's history has been reviewed, patient examined, no change in status, stable for surgery.  I have reviewed the patient's chart and labs.  Questions were answered to the patient's satisfaction.    Patient seen and examined in pre-op. Discussed risks, benefits and alternatives to procedure. Discussed risks of bleeding, pneumothorax and death. No barriers to proceed.   [name]

## 2019-09-27 NOTE — Anesthesia Procedure Notes (Signed)
Procedure Name: Intubation Date/Time: 09/27/2019 7:39 AM Performed by: Neldon Newport, CRNA Pre-anesthesia Checklist: Patient identified, Emergency Drugs available, Suction available, Patient being monitored and Timeout performed Patient Re-evaluated:Patient Re-evaluated prior to induction Oxygen Delivery Method: Circle system utilized Preoxygenation: Pre-oxygenation with 100% oxygen Induction Type: IV induction Ventilation: Mask ventilation without difficulty Laryngoscope Size: Mac and 4 Grade View: Grade II Tube type: Oral Tube size: 8.5 mm Number of attempts: 1 Placement Confirmation: ETT inserted through vocal cords under direct vision,  positive ETCO2 and breath sounds checked- equal and bilateral Secured at: 23 cm Tube secured with: Tape Dental Injury: Teeth and Oropharynx as per pre-operative assessment

## 2019-09-27 NOTE — Op Note (Addendum)
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  Estimated Blood Loss: Minimal, <2BJ   Complications: None   Indications and History: Gerald [name] is a 79 y.o. 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  The risks, benefits, complications, treatment options and expected outcomes were discussed with the patient.  The possibilities of pneumothorax, pneumonia, reaction to medication, pulmonary aspiration, perforation of a viscus, bleeding, failure to diagnose a condition and creating a complication requiring transfusion or operation were discussed with the patient who freely signed the consent.    Description of Procedure: The patient was seen in the Preoperative Area, was examined and was deemed appropriate to proceed.  The patient was taken to [facility] endoscopy room 2, identified as Gerald [name] and the procedure verified as Flexible Video Fiberoptic Bronchoscopy.  A Time Out was held and the above information confirmed.   Prior to the date of the procedure a high-resolution CT scan of the chest was performed. Utilizing [place] a virtual tracheobronchial tree was generated to allow the creation of distinct navigation pathways to the patient's parenchymal abnormalities. After being taken to the operating room general anesthesia was initiated and the patient  was orally intubated. 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  The bronchoscope was used for therapeutic suctioning and clearance of the right mainstem mucous plugging. The extendable working channel and locator guide were introduced into the bronchoscope. The distinct navigation pathways prepared prior to this procedure were then utilized to navigate to within 0.8 cm of patient's lesion(s) identified on CT scan.  A full fluoroscopic sweep was obtained using fluoroscopy, imaging from 25 degrees RAO to 25 degrees LAO during a inspiratory breath-hold APL at 25 cm of water. The extendable working channel was secured into place and the locator guide was withdrawn. Under fluoroscopic guidance transbronchial needle brushings, transbronchial Wang needle biopsies, and transbronchial forceps biopsies were performed to be sent for cytology and pathology. A bronchioalveolar lavage was performed in the right upper lobe and sent for cytology.  We then used the fiducial placement guide to place 3 gold fiducials and 3 separate axial planes at approximately 3 cm from the lesion. At the end of the procedure a general airway inspection was performed and there was no evidence of active bleeding.   At the termination of the navigational procedure the therapeutic bronchoscope was reintroduced into the airway and 2 cc of 1-20,000 dilution of epinephrine was sprayed on the right lower lobe endobronchial tumor.  Using a 1.7 mm erbe cryoprobe endobronchial cryo biopsies were obtained from the opening of the superior segment right lower lobe.  We then used a 1.7 mm cryoprobe to complete 32nd freeze thaw cycles along the medial wall of the superior segment of the right lower lobe as well as the bifurcation into the lower  lobe subsegment.  During this process a few pieces  of tumor were loosened and extracted to clear the opening of the superior segment with [facility] [employer] 2.4 mm forceps.  A BAL was completed along with bronchial washings of the right lower lobe to be sent for cytology.  Bronchoscope was brought to just above the main carina and there was no evidence of active bleeding.   The bronchoscope was removed.  The patient tolerated the procedure well. There was no significant blood loss and there were no obvious complications. A post-procedural chest x-ray is pending.  Samples: 1. Transbronchial needle brushings from RUL 2. Transbronchial Wang needle biopsies from RUL 3. Transbronchial forceps biopsies from RUL 4. Bronchoalveolar lavage from RUL 5. Endobronchial biopsies from RLL 6. BAL RLL  Plans:  The patient will be discharged from the PACU to home when recovered from anesthesia and after chest x-ray is reviewed. We will review the cytology, pathology with the patient when they become available. Outpatient followup will be with Garner Nash, DO.   Garner Nash, DO [facility] 09/27/2019 9:43 AM

## 2019-09-27 NOTE — Discharge Instructions (Signed)
Flexible Bronchoscopy, Care After This sheet gives you information about how to care for yourself after your test. Your doctor may also give you more specific instructions. If you have problems or questions, contact your doctor. Follow these instructions at home: Eating and drinking  The day after the test, go back to your normal diet. Driving  Do not drive for 24 hours if you were given a medicine to help you relax (sedative).  Do not drive or use heavy machinery while taking prescription pain medicine. General instructions   Take over-the-counter and prescription medicines only as told by your doctor.  Return to your normal activities as told. Ask what activities are safe for you.  Do not use any products that have nicotine or tobacco in them. This includes cigarettes and e-cigarettes. If you need help quitting, ask your doctor.  Keep all follow-up visits as told by your doctor. This is important. It is very important if you had a tissue sample (biopsy) taken. Get help right away if:  You have shortness of breath that gets worse.  You get light-headed.  You feel like you are going to pass out (faint).  You have chest pain.  You cough up: ? More than a little blood. ? More blood than before. Summary  Do not eat or drink anything (not even water) for 2 hours after your test, or until your numbing medicine wears off.  Do not use cigarettes. Do not use e-cigarettes.  Get help right away if you have chest pain. This information is not intended to replace advice given to you by your health care provider. Make sure you discuss any questions you have with your health care provider. Document Revised: 02/27/2017 Document Reviewed: 04/04/2016 Elsevier Patient Education  2020 [employer].

## 2019-09-28 ENCOUNTER — Encounter (HOSPITAL_COMMUNITY): Payer: Self-pay | Admitting: Pulmonary Disease

## 2019-09-28 LAB — CYTOLOGY - NON PAP

## 2019-09-28 LAB — SURGICAL PATHOLOGY

## 2019-09-29 ENCOUNTER — Telehealth: Payer: Self-pay | Admitting: Pulmonary Disease

## 2019-09-29 DIAGNOSIS — C3491 Malignant neoplasm of unspecified part of right bronchus or lung: Secondary | ICD-10-CM

## 2019-09-29 NOTE — Telephone Encounter (Signed)
PCCM:  I called patient an informed them of the pathology results.   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 Vs second primary.   Thanks  Garner Nash, DO [place] 09/29/2019 5:48 PM

## 2019-09-30 ENCOUNTER — Telehealth: Payer: Self-pay | Admitting: *Deleted

## 2019-09-30 ENCOUNTER — Encounter: Payer: Self-pay | Admitting: *Deleted

## 2019-09-30 NOTE — Telephone Encounter (Signed)
I called patient to update him that we have received the referral and working on appt.

## 2019-09-30 NOTE — Progress Notes (Signed)
I received referral today on Gerald [name].  Referral to Rad Onc completed.  I notified Rad Onc scheduling of referral.

## 2019-10-04 ENCOUNTER — Encounter: Payer: Self-pay | Admitting: *Deleted

## 2019-10-04 DIAGNOSIS — R911 Solitary pulmonary nodule: Secondary | ICD-10-CM

## 2019-10-04 NOTE — Progress Notes (Signed)
I received referral on Gerald [name].  I updated Dr. Julien Nordmann and he would like to see patient on 10/14/19.  I updated new patient coordinator to call and schedule on this day.

## 2019-10-05 ENCOUNTER — Telehealth: Payer: Self-pay | Admitting: Internal Medicine

## 2019-10-05 NOTE — Telephone Encounter (Signed)
Received a new pt referral from Dr. Valeta Harms for new dx of lung cancer. Gerald [name] has been cld and scheduled to see Dr. Julien Nordmann on 7/16 at 9am w/labs at 52am. Appt date and time has been given to the pt's wife.

## 2019-10-06 ENCOUNTER — Other Ambulatory Visit: Payer: Self-pay | Admitting: *Deleted

## 2019-10-06 NOTE — Progress Notes (Signed)
The proposed treatment discussed in cancer conference 10/06/19 is for discussion purpose only and is not a binding recommendation.  The patient was not physically examined nor present for their treatment options.  Therefore, final treatment plans cannot be decided.

## 2019-10-10 NOTE — Progress Notes (Signed)
Radiation Oncology         [phone] ________________________________  Initial Outpatient Consultation  Name: Gerald [name] MRN: [id number]  Date: 10/12/2019  DOB: 1941/03/16  MW:UXLKG, Gerald Docker, MD  Gerald Nash, DO   REFERRING PHYSICIAN: Garner Nash, DO  DIAGNOSIS: Diagnoses of Primary cancer of right upper lobe of lung ([place]) and Primary cancer of right lower lobe of lung ([place]) were pertinent to this visit.  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 male who is seen as a courtesy of Dr. Valeta [name] for an opinion concerning radiation therapy as part of management for his recently diagnosed lung cancer. Today, he is accompanied by his daughter. The patient presented to Dr. Mosetta [name], PCP, on 07/21/2019 with complaint of cough. Chest x-ray on that day showed a new abnormal findings of the right lower lung with pleural effusion and likely atelectasis changes versus consolidation. CT scan of chest performed at [facility] in [place] on 07/28/2019 showed a 13 mm right upper lobe spiculated pulmonary nodule that was concerning for malignancy. There was also noted to be a moderate right-sided pleural effusion with lower lobe atelectasis and possible right lower lobe endobronchial mucous plugging versus endobronchial lesion.  The patient was referred to Dr. Valeta [name] and was seen in consultation on 09/07/2019. At that time, it was recommended that the patient proceed with PET scan followed by bronchoscopy. Given that the patient is a current smoker and has no desire to quite, he is therefore not a surgical candidate.  PET scan on 09/12/2019 showed a hypermetabolic slightly spiculated solid 1.5 cm right upper lobe pulmonary nodule that was compatible with primary bronchogenic carcinoma. There was also noted to be a 4.1 cm right thyroid partially calcified  non-hypermetabolic nodule. There was no hypermetabolic thoracic adenopathy or distant metabolic disease.  Super chest CT scan on 09/22/2019 showed a stable appearance of the spiculated, FDG avid nodule within the right upper lobe that was suspicious for primary bronchogenic carcinoma. The lobulated right pleural effusion with overlying areas of rounded atelectasis was also stable. It also showed emphysema, aortic atherosclerosis, left main and 3 vessel coronary artery calcifications, gallstones, and a right thyroid gland nodule that measured 4 cm.  The patient underwent a a flexible video fiberoptic bronchoscopy with electromagnetic navigation and biopsies on 06/029/2021 that was performed by Dr. Valeta [name]. Pathology from the procedure revealed squamous cell carcinoma of the right upper lobe and right lower lobe. Cytology from the procedure revealed malignant cells consistent with squamous cell carcinoma of the right upper lobe brushing and atypical cells of the right upper lobe fine needle aspiration.  The Patient was noted on this procedure to have an endobronchial tumor in the superior segment of the right lower lobe(right lower lobe superior segment endobronchial tumor arising from the bifurcation and extending into the medial wall)  This right lower Lesion was not visible on PET scan or  recent CT scan  PREVIOUS RADIATION THERAPY: No  PAST MEDICAL HISTORY:  Past Medical History:  Diagnosis Date   Bursitis    Carotid artery disease (HCC)    COPD (chronic obstructive pulmonary disease) ([place])    Hypercholesterolemia    Lung nodule    Psoriasis    Tobacco abuse    Wears glasses    reading    PAST SURGICAL HISTORY: Past Surgical History:  Procedure Laterality Date  BRONCHIAL BIOPSY  09/27/2019   Procedure: BRONCHIAL BIOPSIES;  Surgeon: Gerald Nash, DO;  Location: [facility];  Service: Pulmonary;;   BRONCHIAL BRUSHINGS  09/27/2019   Procedure: BRONCHIAL BRUSHINGS;  Surgeon:  Gerald Nash, DO;  Location: [facility];  Service: Pulmonary;;   BRONCHIAL NEEDLE ASPIRATION BIOPSY  09/27/2019   Procedure: BRONCHIAL NEEDLE ASPIRATION BIOPSIES;  Surgeon: Gerald Nash, DO;  Location: [facility];  Service: Pulmonary;;   BRONCHIAL WASHINGS  09/27/2019   Procedure: BRONCHIAL WASHINGS;  Surgeon: Gerald Nash, DO;  Location: [place] [facility];  Service: Pulmonary;;   COLONOSCOPY     CRYOTHERAPY  09/27/2019   Procedure: Gerald [name];  Surgeon: Gerald Nash, DO;  Location: [facility];  Service: Pulmonary;;   FIDUCIAL MARKER PLACEMENT  09/27/2019   Procedure: FIDUCIAL MARKER PLACEMENT;  Surgeon: Gerald Nash, DO;  Location: [facility];  Service: Pulmonary;;   HEMOSTASIS CONTROL  09/27/2019   Procedure: HEMOSTASIS CONTROL;  Surgeon: Gerald Nash, DO;  Location: [facility];  Service: Pulmonary;;   HERNIA REPAIR     VIDEO BRONCHOSCOPY WITH ENDOBRONCHIAL NAVIGATION N/A 09/27/2019   Procedure: VIDEO BRONCHOSCOPY WITH ENDOBRONCHIAL NAVIGATION;  Surgeon: Gerald Nash, DO;  Location: [facility];  Service: Pulmonary;  Laterality: N/A;    FAMILY HISTORY:  Family History  Problem Relation Age of Onset   Lung cancer Brother     SOCIAL HISTORY:  Social History   Tobacco Use   Smoking status: Current Every Day Smoker    Packs/day: 2.00    Types: Cigarettes   Smokeless tobacco: Never Used  Vaping Use   Vaping Use: Never used  Substance Use Topics   Alcohol use: Yes    Comment: social   Drug use: Never    ALLERGIES: No Known Allergies  MEDICATIONS:  Current Outpatient Medications  Medication Sig Dispense Refill   acitretin (SORIATANE) 25 MG capsule Take 25 mg by mouth daily.     clobetasol cream (TEMOVATE) 2.37 % Apply 1 application topically daily as needed (Psoriasis).     Multiple Vitamins-Minerals (ICAPS) TABS Take 1 tablet by mouth in the morning and at bedtime. IREDS      Omega-3 Fatty Acids (FISH OIL) 1000 MG CAPS Take  1,000 mg by mouth daily.      pravastatin (PRAVACHOL) 20 MG tablet Take 20 mg by mouth every evening.      albuterol (VENTOLIN HFA) 108 (90 Base) MCG/ACT inhaler Inhale 2 puffs into the lungs every 4 (four) hours as needed for wheezing.  (Patient not taking: Reported on 10/12/2019)     No current facility-administered medications for this encounter.    REVIEW OF SYSTEMS:  A 10+ POINT REVIEW OF SYSTEMS WAS OBTAINED including neurology, dermatology, psychiatry, cardiac, respiratory, lymph, extremities, GI, GU, musculoskeletal, constitutional, reproductive, HEENT.  He denies any pain within the chest area significant cough or hemoptysis.  Patient denies any new bony pain headaches or blurred vision   PHYSICAL EXAM:  height is 6' (1.829 m) and weight is 180 lb 9.6 oz (81.9 kg). His oral temperature is 98.1 F (36.7 C). His blood pressure is 133/74 and his pulse is 76. His respiration is 18 and oxygen saturation is 97%.   General: Alert and oriented, in no acute distress HEENT: Head is normocephalic. Extraocular movements are intact. Oropharynx is clear. Neck: Neck is supple, no palpable cervical or supraclavicular lymphadenopathy. Heart: Regular in rate and rhythm with no murmurs, rubs, or gallops. Chest: Clear to auscultation bilaterally, with no rhonchi, wheezes,  or rales.  Some mild wheezing noted in both lung fields on the left side Abdomen: Soft, nontender, nondistended, with no rigidity or guarding. Extremities: No cyanosis or edema. Lymphatics: see Neck Exam Skin: No concerning lesions. Musculoskeletal: symmetric strength and muscle tone throughout. Neurologic: Cranial nerves II through XII are grossly intact. No obvious focalities. Speech is fluent. Coordination is intact. Psychiatric: Judgment and insight are intact. Affect is appropriate.   ECOG = 1  0 - Asymptomatic (Fully active, able to carry on all predisease activities without restriction)  1 - Symptomatic but completely  ambulatory (Restricted in physically strenuous activity but ambulatory and able to carry out work of a light or sedentary nature. For example, light housework, office work)  2 - Symptomatic, <50% in bed during the day (Ambulatory and capable of all self care but unable to carry out any work activities. Up and about more than 50% of waking hours)  3 - Symptomatic, >50% in bed, but not bedbound (Capable of only limited self-care, confined to bed or chair 50% or more of waking hours)  4 - Bedbound (Completely disabled. Cannot carry on any self-care. Totally confined to bed or chair)  5 - Death   Eustace Pen MM, Creech RH, Tormey DC, et al. [phone]). "Toxicity and response criteria of the [facility]". [place] Oncol. 5 (6): 649-55  LABORATORY DATA:  Lab Results  Component Value Date   WBC 8.0 09/27/2019   HGB 17.3 (H) 09/27/2019   HCT 53.7 (H) 09/27/2019   MCV 96.2 09/27/2019   PLT 180 09/27/2019   Lab Results  Component Value Date   NA 138 09/27/2019   K 4.1 09/27/2019   CL 100 09/27/2019   CO2 29 09/27/2019   GLUCOSE 104 (H) 09/27/2019   CREATININE 1.11 09/27/2019   CALCIUM 9.1 09/27/2019      RADIOGRAPHY: DG CHEST PORT 1 VIEW  Result Date: 09/27/2019 CLINICAL DATA:  Post bronchoscopy. EXAM: PORTABLE CHEST 1 VIEW COMPARISON:  CT 09/22/2019. FINDINGS: Surgical clips noted over the right chest. Mediastinum hilar structures are stable. Right upper lung nodule best identified by prior CT. Right base atelectasis/infiltrate, progressed from prior exam. Right pleural effusion. No pneumothorax. IMPRESSION: 1.  Right upper lung nodular density best identified by prior CT. 2. Right base atelectasis/infiltrate. Atelectatic changes have progressed from prior CT. Right pleural effusion. Electronically Signed   By: Marcello Moores  Register   On: 09/27/2019 10:03   CT Super D Chest Wo Contrast  Result Date: 09/22/2019 CLINICAL DATA:  Right upper lobe pulmonary nodule. EXAM: CT CHEST  WITHOUT CONTRAST TECHNIQUE: Multidetector CT imaging of the chest was performed using thin slice collimation for electromagnetic bronchoscopy planning purposes, without intravenous contrast. COMPARISON:  PET-CT 09/12/2019 FINDINGS: Cardiovascular: Mild cardiac enlargement. Aortic atherosclerosis. Left main, lad, left circumflex and RCA coronary artery calcifications. No pericardial effusion. Mediastinum/Nodes: Right lobe of thyroid gland nodule measures 4 cm, image 31/3. Recommend thyroid US (ref: J Am Coll Radiol. 2015 Feb;12(2): 143-50). The trachea appears patent and is midline. Normal appearance of the esophagus. No supraclavicular, axillary, or mediastinal adenopathy. Hilar structures are suboptimally evaluated due to lack of IV contrast. Lungs/Pleura: Centrilobular emphysema. Loculated right pleural effusion with overlying areas of rounded atelectasis is again identified and appears unchanged. Spiculated, FDG avid nodule within the right upper lobe measures 1.5 x 1.2 cm, image 74/4. Unchanged from the previous exam. Or no additional pulmonary nodules identified. Upper Abdomen: Small calcifications are identified layering in the dependent portion of the gallbladder.  Or no acute abnormality within the imaged portions of the upper abdomen. Musculoskeletal: Mild spondylosis within the thoracic spine. No acute or suspicious osseous findings. IMPRESSION: 1. Stable appearance of spiculated, FDG avid nodule within the right upper lobe suspicious for primary bronchogenic carcinoma. 2. Stable loculated right pleural effusion with overlying areas of rounded atelectasis. 3. Emphysema and aortic atherosclerosis. 4. Left main and 3 vessel coronary artery calcifications noted. 5. Gallstones. 6. Right lobe of thyroid gland nodule measures 4 cm. Recommend thyroid US (ref: J Am Coll Radiol. 2015 Feb;12(2): 143-50). Aortic Atherosclerosis (ICD10-I70.0) and Emphysema (ICD10-J43.9). Electronically Signed   By: Kerby Moors M.D.    On: 09/22/2019 16:51   DG C-ARM BRONCHOSCOPY  Result Date: 09/27/2019 C-ARM BRONCHOSCOPY: Fluoroscopy was utilized by the requesting physician.  No radiographic interpretation.      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  The right upper lobe lesion  would be a candidate for stereotactic body radiation therapy given the location and size of this lesion.  Dr. Valeta [name] kindly placed fiducial markers to aid in treatment for this lesion.  Given the location of the endobronchial lesion, he would not be a candidate for stereotactic body radiation therapy for this lesion but would be a candidate for hypofractionated accelerated radiation therapy over approximately 10 treatments.  The difficulty in treating this lesion will be localizing the lesion given its small size.  During the patient's planning session we will do very fine CT cuts of 1 mm to see if this endobronchial lesion can be identified otherwise we will set up treatment for this area based on his lung anatomy on his treatment planning CT scan.  Today, I talked to the patient and family about the findings and work-up thus far.  We discussed the natural history of lung cancer and general treatment, highlighting the role of radiotherapy (SBRT and hypofractionated accelerated radiation therapy) in the management.  We discussed the available radiation techniques, and focused on the details of logistics and delivery.  We reviewed the anticipated acute and late sequelae associated with radiation in this setting.  The patient was encouraged to ask questions that I answered to the best of my ability.  A patient consent form was discussed and signed.  We retained a copy for our records.  The patient would like to proceed with radiation and will be scheduled for CT simulation.  PLAN: Patient will return for CT simulation on July 27  treatments to begin a few days later.  Anticipate 3 treatments to the right upper lobe lesion and 10 treatments to the endobronchial lesion in the right lower lobe.  Patient and daughter would like to delay initiation of this treatment given an upcoming vacation. He Will likely stay in [place] with his daughter for his radiation treatments as he lives approximately 2 hours from our radiation facility.   Total time spent in this encounter was 60 minutes which included reviewing the patient's most recent CT scans, consultations, PET scan, bronchoscopy, pathology/cytology, physical examination, and documentation.    ------------------------------------------------  Blair Promise, PhD, MD  This document serves as a record of services personally performed by Gery Pray, MD. It was created on his behalf by Clerance Lav, a trained medical scribe. The creation of this record is based on the scribe's personal observations and the provider's statements to them. This document has been checked and approved  by the attending provider.

## 2019-10-11 NOTE — Progress Notes (Addendum)
Thoracic Location of Tumor / Histology:  Squamous cell carcinoma of the RIGHT lower lobe and RIGHT upper lobe  Patient presented ~3 months ago with symptoms of: cough for which patient presented to his PCP Dr. Mosetta Anis on 07/21/2019. Chest x-ray on that day showed a new abnormal findings of the right lower lung with pleural effusion and likely atelectasis changes versus consolidation. CT scan of chest performed at [facility] in [place] on 07/28/2019 showed a 13 mm right upper lobe spiculated pulmonary nodule that was concerning for malignancy. There was also noted to be a moderate right-sided pleural effusion with lower lobe atelectasis and possible right lower lobe endobronchial mucous plugging versus endobronchial lesion.  Biopsies revealed: 09/27/2019 FINAL MICROSCOPIC DIAGNOSIS:  A. LUNG, RIGHT UPPER LOBE, BIOPSY:  - Squamous cell carcinoma.  B. LUNG, RIGHT LOWER LOBE, ENDOBRONCHIAL BIOPSY:  - Squamous cell carcinoma.  COMMENT:  A. and B. In part B, the fragments are superficial and thus definitive invasion is not seen. There is likely sufficient tissue for additional studies if requested  Tobacco/Marijuana/Snuff/ETOH use: Current smoker. He has smoked 2 packs a day for over 40 years.   Past/Anticipated interventions by cardiothoracic surgery, if any:  09/27/2019 Dr. Leory Plowman Icard Video Bronchoscopy with Electromagnetic Navigation, endobronchial cryo biopsy, endobronchial cryotherapy, fiducial placement procedure  Past/Anticipated interventions by medical oncology, if any:  Scheduled for consult with Dr. Curt Bears on 10/14/2019  Signs/Symptoms  Weight changes, if any: 20 pound weight loss over the past 6 months.  Respiratory complaints, if any: some shortness of breath and cough  Hemoptysis, if any: none  Pain issues, if any: none  SAFETY ISSUES:  Prior radiation? none  Pacemaker/ICD? none  Possible current pregnancy? N/A  Is the patient on  methotrexate? No  Current Complaints / other details:  Patient's wife has been ill and he is concerned about her. His brother was d'xd with lung Cancer ? March 2021.  BP 133/74 (BP Location: Left Arm)   Pulse 76   Temp 98.1 F (36.7 C) (Oral)   Resp 18   Ht 6' (1.829 m)   Wt 180 lb 9.6 oz (81.9 kg)   SpO2 97%   BMI 24.49 kg/m   Wt Readings from Last 3 Encounters:  10/12/19 180 lb 9.6 oz (81.9 kg)  09/27/19 186 lb 4.6 oz (84.5 kg)  09/07/19 186 lb 6.4 oz (84.6 kg)

## 2019-10-12 ENCOUNTER — Ambulatory Visit
Admission: RE | Admit: 2019-10-12 | Discharge: 2019-10-12 | Disposition: A | Discharge status: Home / Self Care | Payer: Medicare Other | Source: Ambulatory Visit | Attending: Radiation Oncology | Admitting: Radiation Oncology

## 2019-10-12 ENCOUNTER — Other Ambulatory Visit: Payer: Self-pay

## 2019-10-12 ENCOUNTER — Encounter: Payer: Self-pay | Admitting: Radiation Oncology

## 2019-10-12 DIAGNOSIS — J449 Chronic obstructive pulmonary disease, unspecified: Secondary | ICD-10-CM | POA: Diagnosis not present

## 2019-10-12 DIAGNOSIS — J90 Pleural effusion, not elsewhere classified: Secondary | ICD-10-CM | POA: Diagnosis not present

## 2019-10-12 DIAGNOSIS — R911 Solitary pulmonary nodule: Secondary | ICD-10-CM

## 2019-10-12 DIAGNOSIS — Z79899 Other long term (current) drug therapy: Secondary | ICD-10-CM | POA: Diagnosis not present

## 2019-10-12 DIAGNOSIS — E7800 Pure hypercholesterolemia, unspecified: Secondary | ICD-10-CM | POA: Diagnosis not present

## 2019-10-12 DIAGNOSIS — C3431 Malignant neoplasm of lower lobe, right bronchus or lung: Secondary | ICD-10-CM | POA: Diagnosis not present

## 2019-10-12 DIAGNOSIS — F17210 Nicotine dependence, cigarettes, uncomplicated: Secondary | ICD-10-CM | POA: Diagnosis not present

## 2019-10-12 DIAGNOSIS — E041 Nontoxic single thyroid nodule: Secondary | ICD-10-CM | POA: Insufficient documentation

## 2019-10-12 DIAGNOSIS — I700 Atherosclerosis of aorta: Secondary | ICD-10-CM | POA: Insufficient documentation

## 2019-10-12 DIAGNOSIS — C3411 Malignant neoplasm of upper lobe, right bronchus or lung: Secondary | ICD-10-CM | POA: Diagnosis not present

## 2019-10-14 ENCOUNTER — Other Ambulatory Visit: Payer: Self-pay

## 2019-10-14 ENCOUNTER — Inpatient Hospital Stay (HOSPITAL_BASED_OUTPATIENT_CLINIC_OR_DEPARTMENT_OTHER): Payer: Medicare Other | Admitting: Internal Medicine

## 2019-10-14 ENCOUNTER — Encounter: Payer: Self-pay | Admitting: Internal Medicine

## 2019-10-14 ENCOUNTER — Inpatient Hospital Stay: Payer: Medicare Other | Attending: Internal Medicine

## 2019-10-14 VITALS — BP 112/49 | HR 60 | Temp 97.3°F | Resp 18 | Ht 72.0 in | Wt 182.7 lb

## 2019-10-14 DIAGNOSIS — I119 Hypertensive heart disease without heart failure: Secondary | ICD-10-CM | POA: Diagnosis not present

## 2019-10-14 DIAGNOSIS — Z801 Family history of malignant neoplasm of trachea, bronchus and lung: Secondary | ICD-10-CM

## 2019-10-14 DIAGNOSIS — E041 Nontoxic single thyroid nodule: Secondary | ICD-10-CM | POA: Insufficient documentation

## 2019-10-14 DIAGNOSIS — C3490 Malignant neoplasm of unspecified part of unspecified bronchus or lung: Secondary | ICD-10-CM

## 2019-10-14 DIAGNOSIS — I779 Disorder of arteries and arterioles, unspecified: Secondary | ICD-10-CM

## 2019-10-14 DIAGNOSIS — F17210 Nicotine dependence, cigarettes, uncomplicated: Secondary | ICD-10-CM | POA: Insufficient documentation

## 2019-10-14 DIAGNOSIS — C3431 Malignant neoplasm of lower lobe, right bronchus or lung: Secondary | ICD-10-CM | POA: Diagnosis not present

## 2019-10-14 DIAGNOSIS — R911 Solitary pulmonary nodule: Secondary | ICD-10-CM

## 2019-10-14 DIAGNOSIS — C3411 Malignant neoplasm of upper lobe, right bronchus or lung: Secondary | ICD-10-CM | POA: Diagnosis present

## 2019-10-14 DIAGNOSIS — J449 Chronic obstructive pulmonary disease, unspecified: Secondary | ICD-10-CM | POA: Insufficient documentation

## 2019-10-14 DIAGNOSIS — J90 Pleural effusion, not elsewhere classified: Secondary | ICD-10-CM

## 2019-10-14 DIAGNOSIS — Z79899 Other long term (current) drug therapy: Secondary | ICD-10-CM | POA: Diagnosis not present

## 2019-10-14 DIAGNOSIS — L409 Psoriasis, unspecified: Secondary | ICD-10-CM

## 2019-10-14 DIAGNOSIS — I10 Essential (primary) hypertension: Secondary | ICD-10-CM

## 2019-10-14 DIAGNOSIS — E7800 Pure hypercholesterolemia, unspecified: Secondary | ICD-10-CM | POA: Diagnosis not present

## 2019-10-14 DIAGNOSIS — Z7189 Other specified counseling: Secondary | ICD-10-CM

## 2019-10-14 LAB — CMP (CANCER CENTER ONLY)
ALT: 11 U/L (ref 0–44)
AST: 13 U/L — ABNORMAL LOW (ref 15–41)
Albumin: 3.4 g/dL — ABNORMAL LOW (ref 3.5–5.0)
Alkaline Phosphatase: 71 U/L (ref 38–126)
Anion gap: 10 (ref 5–15)
BUN: 19 mg/dL (ref 8–23)
CO2: 29 mmol/L (ref 22–32)
Calcium: 9.3 mg/dL (ref 8.9–10.3)
Chloride: 102 mmol/L (ref 98–111)
Creatinine: 1.03 mg/dL (ref 0.61–1.24)
GFR, Est AFR Am: >60 mL/min (ref >60)
GFR, Estimated: >60 mL/min (ref >60)
Glucose, Bld: 76 mg/dL (ref 70–99)
Potassium: 4.3 mmol/L (ref 3.5–5.1)
Sodium: 141 mmol/L (ref 135–145)
Total Bilirubin: 0.6 mg/dL (ref 0.3–1.2)
Total Protein: 6.6 g/dL (ref 6.5–8.1)

## 2019-10-14 LAB — CBC WITH DIFFERENTIAL (CANCER CENTER ONLY)
Abs Immature Granulocytes: 0.02 10*3/uL (ref 0.00–0.07)
Basophils Absolute: 0.1 10*3/uL (ref 0.0–0.1)
Basophils Relative: 1 %
Eosinophils Absolute: 0.1 10*3/uL (ref 0.0–0.5)
Eosinophils Relative: 1 %
HCT: 49.8 % (ref 39.0–52.0)
Hemoglobin: 16.3 g/dL (ref 13.0–17.0)
Immature Granulocytes: 0 %
Lymphocytes Relative: 16 %
Lymphs Abs: 1.4 10*3/uL (ref 0.7–4.0)
MCH: 31 pg (ref 26.0–34.0)
MCHC: 32.7 g/dL (ref 30.0–36.0)
MCV: 94.9 fL (ref 80.0–100.0)
Monocytes Absolute: 0.7 10*3/uL (ref 0.1–1.0)
Monocytes Relative: 9 %
Neutro Abs: 6.2 10*3/uL (ref 1.7–7.7)
Neutrophils Relative %: 73 %
Platelet Count: 166 10*3/uL (ref 150–400)
RBC: 5.25 MIL/uL (ref 4.22–5.81)
RDW: 13.6 % (ref 11.5–15.5)
WBC Count: 8.5 10*3/uL (ref 4.0–10.5)
nRBC: 0 % (ref 0.0–0.2)

## 2019-10-14 NOTE — Progress Notes (Signed)
[place] [phone]) [phone]   [phone]  CONSULT NOTE  REFERRING PHYSICIAN: Dr. Leory Plowman Icard  REASON FOR CONSULTATION:  79 years old white male recently diagnosed with lung cancer.  HPI Gerald [name] is a 79 y.o. male with past medical history significant for COPD, carotid artery disease, psoriasis, hypertension and long history of smoking.  The patient was complaining of shortness of breath and cough.  He presented to his local physician in [place] where CT scan of the chest performed at that time and it showed 1.3 cm right upper lobe spiculated pulmonary nodule concerning for malignancy.  He was also found to have moderate right-sided pleural effusion with right lower lobe atelectasis and suspicious right lower lobe endobronchial mucous plugging versus endobronchial lesion.  The patient was referred to Dr. Valeta Harms and a PET scan was performed on 09/12/2019 and it showed hypermetabolic slightly spiculated solid 1.5 x 1.3 cm right upper lobe pulmonary nodule with maximum SUV of 5.2.  No additional hypermetabolic pulmonary nodules and no enlarged or hypermetabolic axillary, mediastinal or hilar lymph nodes.  He also had nonhypermetabolic 4.1 cm right thyroid nodule with scattered internal calcification and heterogeneous hypodensity.  On 09/27/2019 the patient underwent video bronchoscopy with electromagnetic navigation bronchoscopy as well as endobronchial with endobronchial cryotherapy, fiducial placement under the care of Dr. Valeta Harms.  The patient was referred to me today for evaluation and recommendation regarding treatment of his condition.  He was also seen by Dr. Sondra Come for discussion of curative radiotherapy. When seen today the patient is feeling fine with no concerning complaints except for occasional dizzy spells.  He denied having any chest pain, shortness of breath, cough or hemoptysis.  He denied having any nausea, vomiting, diarrhea or constipation.  He  has no headache or visual changes.  He lost around 20 pounds in the last 12 months. Family history significant for father died from heart disease, mother died from old age.  The patient has a brother and sister with lung cancer. He is married and has 2 children.  He was accompanied today by his daughter Almyra Free.  He used to work in [employer].  He has a history of smoking 2 pack/day for around 40 years and unfortunately he continues to smoke.  He has no history of alcohol or drug abuse.  HPI  Past Medical History:  Diagnosis Date  . Bursitis   . Carotid artery disease ([place])   . COPD (chronic obstructive pulmonary disease) ([place])   . Hypercholesterolemia   . Lung nodule   . Psoriasis   . Tobacco abuse   . Wears glasses    reading    Past Surgical History:  Procedure Laterality Date  . BRONCHIAL BIOPSY  09/27/2019   Procedure: BRONCHIAL BIOPSIES;  Surgeon: Garner Nash, DO;  Location: [facility];  Service: Pulmonary;;  . BRONCHIAL BRUSHINGS  09/27/2019   Procedure: BRONCHIAL BRUSHINGS;  Surgeon: Garner Nash, DO;  Location: [facility];  Service: Pulmonary;;  . BRONCHIAL NEEDLE ASPIRATION BIOPSY  09/27/2019   Procedure: BRONCHIAL NEEDLE ASPIRATION BIOPSIES;  Surgeon: Garner Nash, DO;  Location: [facility];  Service: Pulmonary;;  . BRONCHIAL WASHINGS  09/27/2019   Procedure: BRONCHIAL WASHINGS;  Surgeon: Garner Nash, DO;  Location: [facility];  Service: Pulmonary;;  . COLONOSCOPY    . CRYOTHERAPY  09/27/2019   Procedure: CRYOTHERAPY;  Surgeon: Garner Nash, DO;  Location: [facility];  Service: Pulmonary;;  . FIDUCIAL MARKER PLACEMENT  09/27/2019  Procedure: FIDUCIAL MARKER PLACEMENT;  Surgeon: Garner Nash, DO;  Location: [facility];  Service: Pulmonary;;  . HEMOSTASIS CONTROL  09/27/2019   Procedure: HEMOSTASIS CONTROL;  Surgeon: Garner Nash, DO;  Location: [facility];  Service: Pulmonary;;  . HERNIA REPAIR    . VIDEO BRONCHOSCOPY WITH ENDOBRONCHIAL  NAVIGATION N/A 09/27/2019   Procedure: VIDEO BRONCHOSCOPY WITH ENDOBRONCHIAL NAVIGATION;  Surgeon: Garner Nash, DO;  Location: [facility];  Service: Pulmonary;  Laterality: N/A;    Family History  Problem Relation Age of Onset  . Lung cancer Brother     Social History Social History   Tobacco Use  . Smoking status: Current Every Day Smoker    Packs/day: 2.00    Types: Cigarettes  . Smokeless tobacco: Never Used  Vaping Use  . Vaping Use: Never used  Substance Use Topics  . Alcohol use: Yes    Comment: social  . Drug use: Never    No Known Allergies  Current Outpatient Medications  Medication Sig Dispense Refill  . acitretin (SORIATANE) 25 MG capsule Take 25 mg by mouth daily.    Marland Kitchen albuterol (VENTOLIN HFA) 108 (90 Base) MCG/ACT inhaler Inhale 2 puffs into the lungs every 4 (four) hours as needed for wheezing.  (Patient not taking: Reported on 10/12/2019)    . clobetasol cream (TEMOVATE) 9.56 % Apply 1 application topically daily as needed (Psoriasis).    . Multiple Vitamins-Minerals (ICAPS) TABS Take 1 tablet by mouth in the morning and at bedtime. IREDS     . Omega-3 Fatty Acids (FISH OIL) 1000 MG CAPS Take 1,000 mg by mouth daily.     . pravastatin (PRAVACHOL) 20 MG tablet Take 20 mg by mouth every evening.      No current facility-administered medications for this visit.    Review of Systems  Constitutional: positive for fatigue and weight loss Eyes: negative Ears, nose, mouth, throat, and face: negative Respiratory: negative Cardiovascular: negative Gastrointestinal: negative Genitourinary:negative Integument/breast: negative Hematologic/lymphatic: negative Musculoskeletal:negative Neurological: positive for dizziness Behavioral/Psych: negative Endocrine: negative Allergic/Immunologic: negative  Physical Exam  LOV:FIEPP, healthy, no distress, well nourished and well developed SKIN: skin color, texture, turgor are normal, no rashes or significant  lesions HEAD: Normocephalic, No masses, lesions, tenderness or abnormalities EYES: normal, PERRLA, Conjunctiva are pink and non-injected EARS: External ears normal, Canals clear OROPHARYNX:no exudate, no erythema and lips, buccal mucosa, and tongue normal  NECK: supple, no adenopathy, no JVD LYMPH:  no palpable lymphadenopathy, no hepatosplenomegaly LUNGS: clear to auscultation , and palpation HEART: regular rate & rhythm, no murmurs and no gallops ABDOMEN:abdomen soft, non-tender, normal bowel sounds and no masses or organomegaly BACK: Back symmetric, no curvature., No CVA tenderness EXTREMITIES:no joint deformities, effusion, or inflammation, no edema  NEURO: alert & oriented x 3 with fluent speech, no focal motor/sensory deficits  PERFORMANCE STATUS: ECOG 1  LABORATORY DATA: Lab Results  Component Value Date   WBC 8.0 09/27/2019   HGB 17.3 (H) 09/27/2019   HCT 53.7 (H) 09/27/2019   MCV 96.2 09/27/2019   PLT 180 09/27/2019      Chemistry      Component Value Date/Time   NA 138 09/27/2019 0559   K 4.1 09/27/2019 0559   CL 100 09/27/2019 0559   CO2 29 09/27/2019 0559   BUN 14 09/27/2019 0559   CREATININE 1.11 09/27/2019 0559      Component Value Date/Time   CALCIUM 9.1 09/27/2019 0559   ALKPHOS 60 09/27/2019 0559   AST 14 (L) 09/27/2019  0559   ALT 11 09/27/2019 0559   BILITOT 0.7 09/27/2019 0559       RADIOGRAPHIC STUDIES: DG CHEST PORT 1 VIEW  Result Date: 09/27/2019 CLINICAL DATA:  Post bronchoscopy. EXAM: PORTABLE CHEST 1 VIEW COMPARISON:  CT 09/22/2019. FINDINGS: Surgical clips noted over the right chest. Mediastinum hilar structures are stable. Right upper lung nodule best identified by prior CT. Right base atelectasis/infiltrate, progressed from prior exam. Right pleural effusion. No pneumothorax. IMPRESSION: 1.  Right upper lung nodular density best identified by prior CT. 2. Right base atelectasis/infiltrate. Atelectatic changes have progressed from prior CT.  Right pleural effusion. Electronically Signed   By: Marcello Moores  Register   On: 09/27/2019 10:03   CT Super D Chest Wo Contrast  Result Date: 09/22/2019 CLINICAL DATA:  Right upper lobe pulmonary nodule. EXAM: CT CHEST WITHOUT CONTRAST TECHNIQUE: Multidetector CT imaging of the chest was performed using thin slice collimation for electromagnetic bronchoscopy planning purposes, without intravenous contrast. COMPARISON:  PET-CT 09/12/2019 FINDINGS: Cardiovascular: Mild cardiac enlargement. Aortic atherosclerosis. Left main, lad, left circumflex and RCA coronary artery calcifications. No pericardial effusion. Mediastinum/Nodes: Right lobe of thyroid gland nodule measures 4 cm, image 31/3. Recommend thyroid US (ref: J Am Coll Radiol. 2015 Feb;12(2): 143-50). The trachea appears patent and is midline. Normal appearance of the esophagus. No supraclavicular, axillary, or mediastinal adenopathy. Hilar structures are suboptimally evaluated due to lack of IV contrast. Lungs/Pleura: Centrilobular emphysema. Loculated right pleural effusion with overlying areas of rounded atelectasis is again identified and appears unchanged. Spiculated, FDG avid nodule within the right upper lobe measures 1.5 x 1.2 cm, image 74/4. Unchanged from the previous exam. Or no additional pulmonary nodules identified. Upper Abdomen: Small calcifications are identified layering in the dependent portion of the gallbladder. Or no acute abnormality within the imaged portions of the upper abdomen. Musculoskeletal: Mild spondylosis within the thoracic spine. No acute or suspicious osseous findings. IMPRESSION: 1. Stable appearance of spiculated, FDG avid nodule within the right upper lobe suspicious for primary bronchogenic carcinoma. 2. Stable loculated right pleural effusion with overlying areas of rounded atelectasis. 3. Emphysema and aortic atherosclerosis. 4. Left main and 3 vessel coronary artery calcifications noted. 5. Gallstones. 6. Right lobe of  thyroid gland nodule measures 4 cm. Recommend thyroid US (ref: J Am Coll Radiol. 2015 Feb;12(2): 143-50). Aortic Atherosclerosis (ICD10-I70.0) and Emphysema (ICD10-J43.9). Electronically Signed   By: Kerby Moors M.D.   On: 09/22/2019 16:51   DG C-ARM BRONCHOSCOPY  Result Date: 09/27/2019 C-ARM BRONCHOSCOPY: Fluoroscopy was utilized by the requesting physician.  No radiographic interpretation.    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   PLAN: I had a lengthy discussion with the patient and his daughter today about his current disease stage, prognosis and treatment options. I personally and independently reviewed the scan images and discussed the result and showed the images to the patient and his daughter. I recommended for the patient to proceed with the curative radiotherapy as planned by Dr. Sondra Come.  The patient is not a good candidate for surgical resection. With the early stage of his disease, there is no benefit for adjuvant systemic chemotherapy. I recommended for the patient to continue on observation with repeat imaging studies at regular basis. I will see him back for follow-up visit in around 4 months with  repeat CT scan of the chest.  If no evidence for disease recurrence on the upcoming scan, I will see him on an every 44-month basis at least for the first 2 years followed by annual monitoring. I strongly encouraged the patient to quit smoking. He was advised to call immediately if he has any other concerning symptoms in the interval. The patient voices understanding of current disease status and treatment options and is in agreement with the current care plan.  All questions were answered. The patient knows to call the clinic with any problems,  questions or concerns. We can certainly see the patient much sooner if necessary.  Thank you so much for allowing me to participate in the care of Gerald [name]. I will continue to follow up the patient with you and assist in his care.  The total time spent in the appointment was 70 minutes.  Disclaimer: This note was dictated with voice recognition software. Similar sounding words can inadvertently be transcribed and may not be corrected upon review.   Eilleen Kempf October 14, 2019, 9:03 AM

## 2019-10-15 DIAGNOSIS — Z7189 Other specified counseling: Secondary | ICD-10-CM | POA: Insufficient documentation

## 2019-10-17 ENCOUNTER — Telehealth: Payer: Self-pay | Admitting: Internal Medicine

## 2019-10-17 NOTE — Telephone Encounter (Signed)
Scheduled per los. Called and left msg. Mailed printout  °

## 2019-10-24 ENCOUNTER — Ambulatory Visit: Payer: Medicare Other | Admitting: Radiation Oncology

## 2019-10-25 ENCOUNTER — Ambulatory Visit
Admission: RE | Admit: 2019-10-25 | Discharge: 2019-10-25 | Disposition: A | Discharge status: Home / Self Care | Payer: Medicare Other | Source: Ambulatory Visit | Attending: Radiation Oncology | Admitting: Radiation Oncology

## 2019-10-25 ENCOUNTER — Other Ambulatory Visit: Payer: Self-pay

## 2019-10-25 DIAGNOSIS — C3411 Malignant neoplasm of upper lobe, right bronchus or lung: Secondary | ICD-10-CM | POA: Diagnosis present

## 2019-10-25 DIAGNOSIS — C3431 Malignant neoplasm of lower lobe, right bronchus or lung: Secondary | ICD-10-CM | POA: Diagnosis not present

## 2019-10-26 ENCOUNTER — Ambulatory Visit (INDEPENDENT_AMBULATORY_CARE_PROVIDER_SITE_OTHER): Payer: Medicare Other | Admitting: Primary Care

## 2019-10-26 ENCOUNTER — Encounter: Payer: Self-pay | Admitting: Primary Care

## 2019-10-26 DIAGNOSIS — J432 Centrilobular emphysema: Secondary | ICD-10-CM

## 2019-10-26 DIAGNOSIS — Z720 Tobacco use: Secondary | ICD-10-CM

## 2019-10-26 DIAGNOSIS — C3411 Malignant neoplasm of upper lobe, right bronchus or lung: Secondary | ICD-10-CM | POA: Diagnosis not present

## 2019-10-26 MED ORDER — STIOLTO RESPIMAT 2.5-2.5 MCG/ACT IN AERS
2.0000 | INHALATION_SPRAY | Freq: Every day | RESPIRATORY_TRACT | 0 refills | Status: DC
Start: 2019-10-26 — End: 2020-09-18

## 2019-10-26 NOTE — Assessment & Plan Note (Addendum)
-   Patient will proceed with curative radiotherapy as planned by Dr. Sondra Come

## 2019-10-26 NOTE — Addendum Note (Signed)
Addended byCoralie Keens on: 10/26/2019 03:11 PM   Modules accepted: Orders

## 2019-10-26 NOTE — Assessment & Plan Note (Signed)
-   Patient continues to smoke 2 ppd, not ready to quit smoking. Discussed taper amount and picking target quit date - Given patient education for smoking cessation and provided quit now smoking card

## 2019-10-26 NOTE — Progress Notes (Signed)
@Patient  ID: Gerald [name], male    DOB: [birth year], 79 y.o.   MRN: [id number]  Chief Complaint  Patient presents with   Follow-up    lung cancer, doing well    Referring provider: Olam Idler, MD  HPI: 79 year old male, current everyday smoker.  Past medical history significant for right upper lobe lung nodule, emphysema, COPD, pleural effusion.  Patient of Dr. Valeta Harms , seen for initial consult on 09/07/2019.  Plan super D CT imaging, navigational bronchoscopy and fiducial placement for SBRT.   Previous [facility] pulmonary encounter 09/07/2019-Dr. Icard, consult This is an 79 year old gentleman with a past medical history of hypertension, longstanding tobacco abuse history.  Patient underwent CT chest [facility] at [facility] in [place].  There was a 13 mm right upper lobe spiculated pulmonary nodule concerning for malignancy.  Also found to have a moderate right-sided pleural effusion with lower lobe atelectasis and possible right lower lobe endobronchial mucous plugging versus endobronchial lesion.  Decision was made for referral to pulmonary for further evaluation.  This CAT scan was originally completed on 07/28/2019.  OV 09/07/2019: Patient only complains today of cough.  He has this regularly.  Unfortunately he is still smoking.  2 packs/day for greater than the past 40+ years.  Brother recently diagnosed with lung cancer.  Patient denies hemoptysis and has at least a 20 pound weight loss over the past few months.  10/26/2019-interim history Patient presents today for follow-up office visit.  Since last visit patient underwent navigational bronchoscopy with Dr. Valeta Harms, right upper lobe nodule was positive for SCC as well as small right lower lobe endobronchial lesion.  Awaiting oncology/radiology opinion whether or not endobronchial lesion is primary source or metastatic.    Patient's case was discussed in cancer conference on 10/06/2019.  He saw Dr. 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  It was recommended that patient proceed with curative radiotherapy as planned by Dr. Sondra Come.  Patient is not a good candidate for surgical resection.  With early stage of disease there is no benefit for adjuvant systemic chemotherapy.  Recommended patient continue on observation with repeat imaging series at regular basis.  Plan to follow-up with Dr. Earlie Server in 4 months with repeat CT scan chest.  If no evidence of disease recurrence then he will follow up with oncology every 6 months for the first 2 years.  Smoking cessation strongly encouraged.  He is doing well. No respiratory complaints. He has not started radiation therapy yet, plans to begin next week. Weight loss has stabilized. Decreased appetite. Eating ok though. He did not try Stiolto respimat that was given to him during last office visit. He is still smoking 2 packs a day, he is not ready to quit. He did not tolerate chantix in the past. Emphysema seen on imaging.    No Known Allergies  Immunization History  Administered Date(s) Administered   Influenza Whole 03/01/2009, 01/09/2010, 12/10/2010, 01/14/2012, 01/14/2013, 02/01/2014   Influenza, High Dose Seasonal PF 01/11/2019   Influenza,inj,Quad PF,6+ Mos 01/24/2016, 01/21/2017   Influenza,inj,quad, With Preservative 01/17/2015   Moderna SARS-COVID-2 Vaccination 04/27/2019, 05/25/2019   Pneumococcal Conjugate-13 07/06/2012, 03/07/2015    Past Medical History:  Diagnosis Date   Bursitis    Carotid artery disease ([place])    COPD (chronic obstructive pulmonary disease) ([place])    Hypercholesterolemia  Lung nodule    Psoriasis    Tobacco abuse    Wears glasses    reading    Tobacco History: Social History   Tobacco Use  Smoking Status Current Every Day  Smoker   Packs/day: 2.00   Types: Cigarettes  Smokeless Tobacco Never Used   Ready to quit: No Counseling given: Yes   Outpatient Medications Prior to Visit  Medication Sig Dispense Refill   acitretin (SORIATANE) 25 MG capsule Take 25 mg by mouth daily.     albuterol (VENTOLIN HFA) 108 (90 Base) MCG/ACT inhaler Inhale 2 puffs into the lungs every 4 (four) hours as needed for wheezing.      clobetasol cream (TEMOVATE) 2.68 % Apply 1 application topically daily as needed (Psoriasis).     Multiple Vitamins-Minerals (ICAPS) TABS Take 1 tablet by mouth in the morning and at bedtime. IREDS      Omega-3 Fatty Acids (FISH OIL) 1000 MG CAPS Take 1,000 mg by mouth daily.      pravastatin (PRAVACHOL) 20 MG tablet Take 20 mg by mouth every evening.      No facility-administered medications prior to visit.   Review of Systems  Review of Systems  Constitutional: Positive for appetite change. Negative for unexpected weight change.  HENT: Negative.   Respiratory: Negative for cough, chest tightness, shortness of breath and wheezing.   Cardiovascular: Negative.    Physical Exam  BP (!) 114/60 (BP Location: Left Arm, Cuff Size: Normal)    Pulse (!) 44    Temp (!) 97.2 F (36.2 C) (Oral)    Ht 6' (1.829 m)    Wt 180 lb 3.2 oz (81.7 kg)    SpO2 95%    BMI 24.44 kg/m  Physical Exam Constitutional:      Appearance: Normal appearance.  Cardiovascular:     Rate and Rhythm: Normal rate and regular rhythm.  Pulmonary:     Effort: Pulmonary effort is normal.     Breath sounds: Normal breath sounds.  Skin:    General: Skin is warm and dry.  Neurological:     General: No focal deficit present.     Mental Status: He is alert and oriented to person, place, and time. Mental status is at baseline.  Psychiatric:        Mood and Affect: Mood normal.        Behavior: Behavior normal.        Thought Content: Thought content normal.        Judgment: Judgment normal.     Lab Results:  CBC     Component Value Date/Time   WBC 8.5 10/14/2019 0855   WBC 8.0 09/27/2019 0559   RBC 5.25 10/14/2019 0855   HGB 16.3 10/14/2019 0855   HCT 49.8 10/14/2019 0855   PLT 166 10/14/2019 0855   MCV 94.9 10/14/2019 0855   MCH 31.0 10/14/2019 0855   MCHC 32.7 10/14/2019 0855   RDW 13.6 10/14/2019 0855   LYMPHSABS 1.4 10/14/2019 0855   MONOABS 0.7 10/14/2019 0855   EOSABS 0.1 10/14/2019 0855   BASOSABS 0.1 10/14/2019 0855    BMET    Component Value Date/Time   NA 141 10/14/2019 0855   K 4.3 10/14/2019 0855   CL 102 10/14/2019 0855   CO2 29 10/14/2019 0855   GLUCOSE 76 10/14/2019 0855   BUN 19 10/14/2019 0855   CREATININE 1.03 10/14/2019 0855   CALCIUM 9.3 10/14/2019 0855   GFRNONAA >60 10/14/2019 0855   GFRAA >60 10/14/2019  0855    BNP No results found for: BNP  ProBNP No results found for: PROBNP  Imaging: DG CHEST PORT 1 VIEW  Result Date: 09/27/2019 CLINICAL DATA:  Post bronchoscopy. EXAM: PORTABLE CHEST 1 VIEW COMPARISON:  CT 09/22/2019. FINDINGS: Surgical clips noted over the right chest. Mediastinum hilar structures are stable. Right upper lung nodule best identified by prior CT. Right base atelectasis/infiltrate, progressed from prior exam. Right pleural effusion. No pneumothorax. IMPRESSION: 1.  Right upper lung nodular density best identified by prior CT. 2. Right base atelectasis/infiltrate. Atelectatic changes have progressed from prior CT. Right pleural effusion. Electronically Signed   By: Marcello Moores  Register   On: 09/27/2019 10:03   DG C-ARM BRONCHOSCOPY  Result Date: 09/27/2019 C-ARM BRONCHOSCOPY: Fluoroscopy was utilized by the requesting physician.  No radiographic interpretation.     Assessment & Plan:   Centrilobular emphysema ([place]) - Asymptomatic. No specific respiratory symptoms - No PFTs on file - Recommend patient try Stiolto Respimat prescribed by Dr. Valeta Harms at last visit, if no benefit he can stop   Primary cancer of right upper lobe of lung  [facility]) - Patient will proceed with curative radiotherapy as planned by Dr. Randa Ngo  Tobacco abuse - Patient continues to smoke 2 ppd, not ready to quit smoking. Discussed taper amount and picking target quit date - Given patient education for smoking cessation and provided quit now smoking card    Martyn Ehrich, NP 10/26/2019

## 2019-10-26 NOTE — Assessment & Plan Note (Addendum)
-   Asymptomatic. No specific respiratory symptoms - No PFTs on file - Recommend patient try Stiolto Respimat prescribed by Dr. Valeta Harms at last visit, if no benefit he can stop

## 2019-10-26 NOTE — Patient Instructions (Addendum)
Emphysema: Trial Stiolto Respimat- take two puffs once daily in the morning (if beneficial let us know and we will send in prescription)  Tobacco use: Encourage you taper down amount you are smoking and pick a target quit date  Try using  nicotine replacement  If you would like to make an apt with our pulmonary pharmacist for further smoking cessation education let us know   Follow-up: 6 months with Dr. Valeta Harms or sooner if breathing worsens    Chronic Obstructive Pulmonary Disease Chronic obstructive pulmonary disease (COPD) is a long-term (chronic) lung problem. When you have COPD, it is hard for air to get in and out of your lungs. Usually the condition gets worse over time, and your lungs will never return to normal. There are things you can do to keep yourself as healthy as possible.  Your doctor may treat your condition with: ? Medicines. ? Oxygen. ? Lung surgery.  Your doctor may also recommend: ? Rehabilitation. This includes steps to make your body work better. It may involve a team of specialists. ? Quitting smoking, if you smoke. ? Exercise and changes to your diet. ? Comfort measures (palliative care). Follow these instructions at home: Medicines  Take over-the-counter and prescription medicines only as told by your doctor.  Talk to your doctor before taking any cough or allergy medicines. You may need to avoid medicines that cause your lungs to be dry. Lifestyle  If you smoke, stop. Smoking makes the problem worse. If you need help quitting, ask your doctor.  Avoid being around things that make your breathing worse. This may include smoke, chemicals, and fumes.  Stay active, but remember to rest as well.  Learn and use tips on how to relax.  Make sure you get enough sleep. Most adults need at least 7 hours of sleep every night.  Eat healthy foods. Eat smaller meals more often. Rest before meals. Controlled breathing Learn and use tips on how to control your  breathing as told by your doctor. Try:  Breathing in (inhaling) through your nose for 1 second. Then, pucker your lips and breath out (exhale) through your lips for 2 seconds.  Putting one hand on your belly (abdomen). Breathe in slowly through your nose for 1 second. Your hand on your belly should move out. Pucker your lips and breathe out slowly through your lips. Your hand on your belly should move in as you breathe out.  Controlled coughing Learn and use controlled coughing to clear mucus from your lungs. Follow these steps: 1. Lean your head a little forward. 2. Breathe in deeply. 3. Try to hold your breath for 3 seconds. 4. Keep your mouth slightly open while coughing 2 times. 5. Spit any mucus out into a tissue. 6. Rest and do the steps again 1 or 2 times as needed. General instructions  Make sure you get all the shots (vaccines) that your doctor recommends. Ask your doctor about a flu shot and a pneumonia shot.  Use oxygen therapy and pulmonary rehabilitation if told by your doctor. If you need home oxygen therapy, ask your doctor if you should buy a tool to measure your oxygen level (oximeter).  Make a COPD action plan with your doctor. This helps you to know what to do if you feel worse than usual.  Manage any other conditions you have as told by your doctor.  Avoid going outside when it is very hot, cold, or humid.  Avoid people who have a sickness you can  catch (contagious).  Keep all follow-up visits as told by your doctor. This is important. Contact a doctor if:  You cough up more mucus than usual.  There is a change in the color or thickness of the mucus.  It is harder to breathe than usual.  Your breathing is faster than usual.  You have trouble sleeping.  You need to use your medicines more often than usual.  You have trouble doing your normal activities such as getting dressed or walking around the house. Get help right away if:  You have shortness of  breath while resting.  You have shortness of breath that stops you from: ? Being able to talk. ? Doing normal activities.  Your chest hurts for longer than 5 minutes.  Your skin color is more blue than usual.  Your pulse oximeter shows that you have low oxygen for longer than 5 minutes.  You have a fever.  You feel too tired to breathe normally. Summary  Chronic obstructive pulmonary disease (COPD) is a long-term lung problem.  The way your lungs work will never return to normal. Usually the condition gets worse over time. There are things you can do to keep yourself as healthy as possible.  Take over-the-counter and prescription medicines only as told by your doctor.  If you smoke, stop. Smoking makes the problem worse. This information is not intended to replace advice given to you by your health care provider. Make sure you discuss any questions you have with your health care provider. Document Revised: 02/27/2017 Document Reviewed: 04/21/2016 Elsevier Patient Education  2020 [employer].    Steps to Quit Smoking Smoking tobacco is the leading cause of preventable death. It can affect almost every organ in the body. Smoking puts you and people around you at risk for many serious, long-lasting (chronic) diseases. Quitting smoking can be hard, but it is one of the best things that you can do for your health. It is never too late to quit. How do I get ready to quit? When you decide to quit smoking, make a plan to help you succeed. Before you quit:  Pick a date to quit. Set a date within the next 2 weeks to give you time to prepare.  Write down the reasons why you are quitting. Keep this list in places where you will see it often.  Tell your family, friends, and co-workers that you are quitting. Their support is important.  Talk with your doctor about the choices that may help you quit.  Find out if your health insurance will pay for these treatments.  Know the people,  places, things, and activities that make you want to smoke (triggers). Avoid them. What first steps can I take to quit smoking?  Throw away all cigarettes at home, at work, and in your car.  Throw away the things that you use when you smoke, such as ashtrays and lighters.  Clean your car. Make sure to empty the ashtray.  Clean your home, including curtains and carpets. What can I do to help me quit smoking? Talk with your doctor about taking medicines and seeing a counselor at the same time. You are more likely to succeed when you do both.  If you are pregnant or breastfeeding, talk with your doctor about counseling or other ways to quit smoking. Do not take medicine to help you quit smoking unless your doctor tells you to do so. To quit smoking: Quit right away  Quit smoking totally, instead of  slowly cutting back on how much you smoke over a period of time.  Go to counseling. You are more likely to quit if you go to counseling sessions regularly. Take medicine You may take medicines to help you quit. Some medicines need a prescription, and some you can buy over-the-counter. Some medicines may contain a drug called nicotine to replace the nicotine in cigarettes. Medicines may:  Help you to stop having the desire to smoke (cravings).  Help to stop the problems that come when you stop smoking (withdrawal symptoms). Your doctor may ask you to use:  Nicotine patches, gum, or lozenges.  Nicotine inhalers or sprays.  Non-nicotine medicine that is taken by mouth. Find resources Find resources and other ways to help you quit smoking and remain smoke-free after you quit. These resources are most helpful when you use them often. They include:  Online chats with a Social worker.  Phone quitlines.  Printed Furniture conservator/restorer.  Support groups or group counseling.  Text messaging programs.  Mobile phone apps. Use apps on your mobile phone or tablet that can help you stick to your quit  plan. There are many free apps for mobile phones and tablets as well as websites. Examples include Quit Guide from the [employer] and smokefree.gov  What things can I do to make it easier to quit?   Talk to your family and friends. Ask them to support and encourage you.  Call a phone quitline (1-800-QUIT-NOW), reach out to support groups, or work with a Social worker.  Ask people who smoke to not smoke around you.  Avoid places that make you want to smoke, such as: ? Bars. ? Parties. ? Smoke-break areas at work.  Spend time with people who do not smoke.  Lower the stress in your life. Stress can make you want to smoke. Try these things to help your stress: ? Getting regular exercise. ? Doing deep-breathing exercises. ? Doing yoga. ? Meditating. ? Doing a body scan. To do this, close your eyes, focus on one area of your body at a time from head to toe. Notice which parts of your body are tense. Try to relax the muscles in those areas. How will I feel when I quit smoking? Day 1 to 3 weeks Within the first 24 hours, you may start to have some problems that come from quitting tobacco. These problems are very bad 2-3 days after you quit, but they do not often last for more than 2-3 weeks. You may get these symptoms:  Mood swings.  Feeling restless, nervous, angry, or annoyed.  Trouble concentrating.  Dizziness.  Strong desire for high-sugar foods and nicotine.  Weight gain.  Trouble pooping (constipation).  Feeling like you may vomit (nausea).  Coughing or a sore throat.  Changes in how the medicines that you take for other issues work in your body.  Depression.  Trouble sleeping (insomnia). Week 3 and afterward After the first 2-3 weeks of quitting, you may start to notice more positive results, such as:  Better sense of smell and taste.  Less coughing and sore throat.  Slower heart rate.  Lower blood pressure.  Clearer skin.  Better breathing.  Fewer sick  days. Quitting smoking can be hard. Do not give up if you fail the first time. Some people need to try a few times before they succeed. Do your best to stick to your quit plan, and talk with your doctor if you have any questions or concerns. Summary  Smoking tobacco is the  leading cause of preventable death. Quitting smoking can be hard, but it is one of the best things that you can do for your health.  When you decide to quit smoking, make a plan to help you succeed.  Quit smoking right away, not slowly over a period of time.  When you start quitting, seek help from your doctor, family, or friends. This information is not intended to replace advice given to you by your health care provider. Make sure you discuss any questions you have with your health care provider. Document Revised: 12/10/2018 Document Reviewed: 06/05/2018 Elsevier Patient Education  [place].

## 2019-10-27 NOTE — Progress Notes (Signed)
Thanks for seeing him  Garner Nash, DO [facility] 10/27/2019 8:23 AM

## 2019-10-31 ENCOUNTER — Ambulatory Visit: Payer: Medicare Other | Admitting: Radiation Oncology

## 2019-10-31 DIAGNOSIS — C3411 Malignant neoplasm of upper lobe, right bronchus or lung: Secondary | ICD-10-CM | POA: Diagnosis present

## 2019-10-31 DIAGNOSIS — C3431 Malignant neoplasm of lower lobe, right bronchus or lung: Secondary | ICD-10-CM | POA: Diagnosis not present

## 2019-10-31 NOTE — Progress Notes (Signed)
  Radiation Oncology         [phone] ________________________________  Name: Gerald [name] MRN: [id number]  Date: 11/07/2019  DOB: 1941/01/25  Stereotactic Body Radiotherapy Treatment Procedure Note  NARRATIVE:  [name] was brought to the stereotactic radiation treatment machine and placed supine on the CT couch. The patient was set up for stereotactic body radiotherapy on the body fix pillow.  3D TREATMENT PLANNING AND DOSIMETRY:  The patient's radiation plan was reviewed and approved prior to starting treatment.  It showed 3-dimensional radiation distributions overlaid onto the planning CT.  [facility] for the target structures as well as the organs at risk were reviewed. The documentation of this is filed in the radiation oncology EMR.  SIMULATION VERIFICATION:  The patient underwent CT imaging on the treatment unit.  These were carefully aligned to document that the ablative radiation dose would cover the target volume and maximally spare the nearby organs at risk according to the planned distribution.  SPECIAL TREATMENT PROCEDURE: [name] received high dose ablative stereotactic body radiotherapy to the planned target volume without unforeseen complications. Treatment was delivered uneventfully. The high doses associated with stereotactic body radiotherapy and the significant potential risks require careful treatment set up and patient monitoring constituting a special treatment procedure   STEREOTACTIC TREATMENT MANAGEMENT:  Following delivery, the patient was evaluated clinically. The patient tolerated treatment without significant acute effects, and was discharged to home in stable condition.    PLAN: Continue treatment as planned.  ________________________________  Blair Promise, PhD, MD  This document serves as a record of services personally performed by Gery Pray, MD. It was created on his behalf by Clerance Lav, a trained medical scribe. The creation of this  record is based on the scribe's personal observations and the provider's statements to them. This document has been checked and approved by the attending provider.

## 2019-11-01 ENCOUNTER — Ambulatory Visit
Admission: RE | Admit: 2019-11-01 | Discharge: 2019-11-01 | Disposition: A | Discharge status: Home / Self Care | Payer: Medicare Other | Source: Ambulatory Visit | Attending: Radiation Oncology | Admitting: Radiation Oncology

## 2019-11-01 ENCOUNTER — Other Ambulatory Visit: Payer: Self-pay

## 2019-11-01 DIAGNOSIS — C3411 Malignant neoplasm of upper lobe, right bronchus or lung: Secondary | ICD-10-CM | POA: Diagnosis not present

## 2019-11-02 ENCOUNTER — Ambulatory Visit
Admission: RE | Admit: 2019-11-02 | Discharge: 2019-11-02 | Disposition: A | Discharge status: Home / Self Care | Payer: Medicare Other | Source: Ambulatory Visit | Attending: Radiation Oncology | Admitting: Radiation Oncology

## 2019-11-02 ENCOUNTER — Other Ambulatory Visit: Payer: Self-pay

## 2019-11-02 DIAGNOSIS — C3411 Malignant neoplasm of upper lobe, right bronchus or lung: Secondary | ICD-10-CM | POA: Diagnosis not present

## 2019-11-03 ENCOUNTER — Other Ambulatory Visit: Payer: Self-pay

## 2019-11-03 ENCOUNTER — Ambulatory Visit
Admission: RE | Admit: 2019-11-03 | Discharge: 2019-11-03 | Disposition: A | Discharge status: Home / Self Care | Payer: Medicare Other | Source: Ambulatory Visit | Attending: Radiation Oncology | Admitting: Radiation Oncology

## 2019-11-03 DIAGNOSIS — C3411 Malignant neoplasm of upper lobe, right bronchus or lung: Secondary | ICD-10-CM | POA: Diagnosis not present

## 2019-11-04 ENCOUNTER — Ambulatory Visit
Admission: RE | Admit: 2019-11-04 | Discharge: 2019-11-04 | Disposition: A | Discharge status: Home / Self Care | Payer: Medicare Other | Source: Ambulatory Visit | Attending: Radiation Oncology | Admitting: Radiation Oncology

## 2019-11-04 DIAGNOSIS — C3411 Malignant neoplasm of upper lobe, right bronchus or lung: Secondary | ICD-10-CM | POA: Diagnosis not present

## 2019-11-07 ENCOUNTER — Other Ambulatory Visit: Payer: Self-pay

## 2019-11-07 ENCOUNTER — Ambulatory Visit
Admission: RE | Admit: 2019-11-07 | Discharge: 2019-11-07 | Disposition: A | Discharge status: Home / Self Care | Payer: Medicare Other | Source: Ambulatory Visit | Attending: Radiation Oncology | Admitting: Radiation Oncology

## 2019-11-07 DIAGNOSIS — C3411 Malignant neoplasm of upper lobe, right bronchus or lung: Secondary | ICD-10-CM | POA: Diagnosis not present

## 2019-11-07 NOTE — Progress Notes (Signed)
  Radiation Oncology         [phone] ________________________________  Name: Cisco Kindt MRN: [id number]  Date: 11/08/2019  DOB: Dec 13, 1940  Stereotactic Body Radiotherapy Treatment Procedure Note  NARRATIVE:  Thelbert Gartin was brought to the stereotactic radiation treatment machine and placed supine on the CT couch. The patient was set up for stereotactic body radiotherapy on the body fix pillow.  3D TREATMENT PLANNING AND DOSIMETRY:  The patient's radiation plan was reviewed and approved prior to starting treatment.  It showed 3-dimensional radiation distributions overlaid onto the planning CT.  [facility] for the target structures as well as the organs at risk were reviewed. The documentation of this is filed in the radiation oncology EMR.  SIMULATION VERIFICATION:  The patient underwent CT imaging on the treatment unit.  These were carefully aligned to document that the ablative radiation dose would cover the target volume and maximally spare the nearby organs at risk according to the planned distribution.  SPECIAL TREATMENT PROCEDURE: [name] received high dose ablative stereotactic body radiotherapy to the planned target volume without unforeseen complications. Treatment was delivered uneventfully. The high doses associated with stereotactic body radiotherapy and the significant potential risks require careful treatment set up and patient monitoring constituting a special treatment procedure   STEREOTACTIC TREATMENT MANAGEMENT:  Following delivery, the patient was evaluated clinically. The patient tolerated treatment without significant acute effects, and was discharged to home in stable condition.    PLAN: Continue treatment as planned.  ________________________________  Blair Promise, PhD, MD  This document serves as a record of services personally performed by Gery Pray, MD. It was created on his behalf by Clerance Lav, a trained medical scribe. The creation of this  record is based on the scribe's personal observations and the provider's statements to them. This document has been checked and approved by the attending provider.

## 2019-11-08 ENCOUNTER — Ambulatory Visit
Admission: RE | Admit: 2019-11-08 | Discharge: 2019-11-08 | Disposition: A | Discharge status: Home / Self Care | Payer: Medicare Other | Source: Ambulatory Visit | Attending: Radiation Oncology | Admitting: Radiation Oncology

## 2019-11-08 ENCOUNTER — Other Ambulatory Visit: Payer: Self-pay

## 2019-11-08 DIAGNOSIS — C3411 Malignant neoplasm of upper lobe, right bronchus or lung: Secondary | ICD-10-CM

## 2019-11-09 ENCOUNTER — Other Ambulatory Visit: Payer: Self-pay

## 2019-11-09 ENCOUNTER — Ambulatory Visit
Admission: RE | Admit: 2019-11-09 | Discharge: 2019-11-09 | Disposition: A | Discharge status: Home / Self Care | Payer: Medicare Other | Source: Ambulatory Visit | Attending: Radiation Oncology | Admitting: Radiation Oncology

## 2019-11-09 DIAGNOSIS — C3431 Malignant neoplasm of lower lobe, right bronchus or lung: Secondary | ICD-10-CM

## 2019-11-09 DIAGNOSIS — C3411 Malignant neoplasm of upper lobe, right bronchus or lung: Secondary | ICD-10-CM | POA: Diagnosis not present

## 2019-11-10 ENCOUNTER — Ambulatory Visit
Admission: RE | Admit: 2019-11-10 | Discharge: 2019-11-10 | Disposition: A | Discharge status: Home / Self Care | Payer: Medicare Other | Source: Ambulatory Visit | Attending: Radiation Oncology | Admitting: Radiation Oncology

## 2019-11-10 ENCOUNTER — Other Ambulatory Visit: Payer: Self-pay

## 2019-11-10 DIAGNOSIS — C3411 Malignant neoplasm of upper lobe, right bronchus or lung: Secondary | ICD-10-CM | POA: Diagnosis not present

## 2019-11-11 ENCOUNTER — Ambulatory Visit
Admission: RE | Admit: 2019-11-11 | Discharge: 2019-11-11 | Disposition: A | Discharge status: Home / Self Care | Payer: Medicare Other | Source: Ambulatory Visit | Attending: Radiation Oncology | Admitting: Radiation Oncology

## 2019-11-11 ENCOUNTER — Other Ambulatory Visit: Payer: Self-pay

## 2019-11-11 DIAGNOSIS — C3411 Malignant neoplasm of upper lobe, right bronchus or lung: Secondary | ICD-10-CM | POA: Diagnosis not present

## 2019-11-14 ENCOUNTER — Ambulatory Visit
Admission: RE | Admit: 2019-11-14 | Discharge: 2019-11-14 | Disposition: A | Discharge status: Home / Self Care | Payer: Medicare Other | Source: Ambulatory Visit | Attending: Radiation Oncology | Admitting: Radiation Oncology

## 2019-11-14 ENCOUNTER — Other Ambulatory Visit: Payer: Self-pay

## 2019-11-14 ENCOUNTER — Encounter: Payer: Self-pay | Admitting: Radiation Oncology

## 2019-11-14 DIAGNOSIS — C3411 Malignant neoplasm of upper lobe, right bronchus or lung: Secondary | ICD-10-CM

## 2019-11-21 NOTE — Progress Notes (Incomplete)
  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 He reported some very mild fatigue but denied poor appetite, difficulty swallowing, weakness, shortness of breath, and skin changes.  Plan: The patient will follow-up with radiation oncology in one month.  ________________________________________________   Blair Promise, PhD, MD  This document serves as a record of services personally performed by Gery Pray, MD. It was created on his behalf by Clerance Lav, a trained medical scribe. The creation of this record is based on the scribe's personal observations and the provider's statements to them. This document has been checked and approved by the attending provider.

## 2019-12-06 ENCOUNTER — Telehealth: Payer: Self-pay | Admitting: *Deleted

## 2019-12-06 NOTE — Telephone Encounter (Signed)
XXXX 

## 2019-12-06 NOTE — Telephone Encounter (Signed)
CALLED PATIENT TO ALTER FU ON 12-15-19 DUE TO DR. KINARD BEING IN THE OR, RESCHEDULED FOR 01-02-20 @ 4 PM, SPOKE [facility] PATIENT AND HE IS AWARE OF THIS APPT.

## 2019-12-15 ENCOUNTER — Ambulatory Visit: Payer: Self-pay | Admitting: Radiation Oncology

## 2019-12-19 ENCOUNTER — Encounter: Payer: Self-pay | Admitting: Radiation Oncology

## 2019-12-19 ENCOUNTER — Ambulatory Visit: Payer: Medicare Other | Admitting: Radiation Oncology

## 2019-12-19 ENCOUNTER — Other Ambulatory Visit: Payer: Self-pay

## 2019-12-19 ENCOUNTER — Ambulatory Visit
Admission: RE | Admit: 2019-12-19 | Discharge: 2019-12-19 | Disposition: A | Discharge status: Home / Self Care | Payer: Medicare Other | Source: Ambulatory Visit | Attending: Radiation Oncology | Admitting: Radiation Oncology

## 2019-12-19 VITALS — BP 108/89 | HR 72 | Temp 97.8°F | Resp 18 | Ht 72.0 in | Wt 177.1 lb

## 2019-12-19 DIAGNOSIS — F17210 Nicotine dependence, cigarettes, uncomplicated: Secondary | ICD-10-CM | POA: Diagnosis not present

## 2019-12-19 DIAGNOSIS — C3431 Malignant neoplasm of lower lobe, right bronchus or lung: Secondary | ICD-10-CM | POA: Diagnosis not present

## 2019-12-19 DIAGNOSIS — Z923 Personal history of irradiation: Secondary | ICD-10-CM | POA: Insufficient documentation

## 2019-12-19 DIAGNOSIS — Z79899 Other long term (current) drug therapy: Secondary | ICD-10-CM | POA: Diagnosis not present

## 2019-12-19 DIAGNOSIS — C3411 Malignant neoplasm of upper lobe, right bronchus or lung: Secondary | ICD-10-CM | POA: Diagnosis not present

## 2019-12-19 NOTE — Progress Notes (Signed)
Radiation Oncology         [phone] ________________________________  Name: Gerald [name] MRN: [id number]  Date: 12/19/2019  DOB: 12-Dec-1940  Follow-Up Visit Note  CC: Olam Idler, MD  Garner Nash, DO    ICD-10-CM   1. Primary cancer of right upper lobe of lung (HCC)  C34.11   2. 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  Radiation Treatment Dates: 11/01/2019 through 11/14/2019 Site Technique Total Dose (Gy) Dose per Fx (Gy) Completed Fx Beam Energies  Lung, Right: Lung_Rt IMRT 54/54 18 3/3 6XFFF  Lung, Right: Lung_Rt 3D 30/30 3 10/10 6X, 10X    Narrative:  The patient returns today for routine follow-up.  No significant interval history since the end of treatment.  Patient is undergoing evaluation for heart issues with his primary care physician  On review of systems, he reports breathing well. he denies cough or hemoptysis.  Denies any significant fatigue.                             ALLERGIES:  has No Known Allergies.  Meds: Current Outpatient Medications  Medication Sig Dispense Refill  . acitretin (SORIATANE) 25 MG capsule Take 25 mg by mouth daily.    Marland Kitchen albuterol (VENTOLIN HFA) 108 (90 Base) MCG/ACT inhaler Inhale 2 puffs into the lungs every 4 (four) hours as needed for wheezing.     . clobetasol cream (TEMOVATE) 5.36 % Apply 1 application topically daily as needed (Psoriasis).    . Multiple Vitamins-Minerals (ICAPS) TABS Take 1 tablet by mouth in the morning and at bedtime. IREDS     . omega-3 acid ethyl esters (LOVAZA) 1 g capsule     . Omega-3 Fatty Acids (FISH OIL) 1000 MG CAPS Take 1,000 mg by mouth daily.     . pravastatin (PRAVACHOL) 20 MG tablet Take 20 mg by mouth every evening.     . Tiotropium Bromide-Olodaterol (STIOLTO RESPIMAT) 2.5-2.5 MCG/ACT AERS Inhale  2 puffs into the lungs daily. 4 g 0   No current facility-administered medications for this encounter.    Physical Findings: The patient is in no acute distress. Patient is alert and oriented.  height is 6' (1.829 m) and weight is 177 lb 2 oz (80.3 kg). His oral temperature is 97.8 F (36.6 C). His blood pressure is 108/89 and his pulse is 72. His respiration is 18 and oxygen saturation is 96%.  No significant changes. Lungs are clear to auscultation bilaterally. Heart has regular rate and rhythm. No palpable cervical, supraclavicular, or axillary adenopathy. Abdomen soft, non-tender, normal bowel sounds.   Lab Findings: Lab Results  Component Value Date   WBC 8.5 10/14/2019   HGB 16.3 10/14/2019   HCT 49.8 10/14/2019   MCV 94.9 10/14/2019   PLT 166 10/14/2019    Radiographic Findings: No results found.  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  Overall tolerated his radiation therapy quite well.  Plan: The patient is scheduled to follow-up with Dr. Julien Nordmann on 02/14/2020. he will follow-up with radiation oncology in 3 months.  Patient will have a chest CT scan and blood work  prior to his visit with Dr. Julien Nordmann.    ____________________________________   Blair Promise, PhD, MD  This document serves as a record of services personally performed by Gery Pray, MD. It was created on his behalf by Clerance Lav, a trained medical scribe. The creation of this record is based on the scribe's personal observations and the provider's statements to them. This document has been checked and approved by the attending provider.

## 2019-12-19 NOTE — Progress Notes (Signed)
Weight stable. Heart rate and blood pressure erratic. Patient reports his cardiologist, Dr. Carlis Abbott, is aware. Explains he wore a heart monitor for one week the week before last. Explains he wasn't prescribed any new medications by Dr. Carlis Abbott. Reports occasional lightheadedness and dizziness with standing. Reports very little energy but confirms this isn't a change from normal. Reports an occasional productive cough with clear phlegm. Denies hemoptysis. Denies pain or difficulty associated with swallowing. Denies any skin changes.   BP 108/89 (BP Location: Left Arm)   Pulse 72   Temp 97.8 F (36.6 C) (Oral)   Resp 18   Ht 6' (1.829 m)   Wt 177 lb 2 oz (80.3 kg)   SpO2 96%   BMI 24.02 kg/m  Wt Readings from Last 3 Encounters:  12/19/19 177 lb 2 oz (80.3 kg)  10/26/19 180 lb 3.2 oz (81.7 kg)  10/14/19 182 lb 11.2 oz (82.9 kg)

## 2020-01-02 ENCOUNTER — Ambulatory Visit: Payer: Medicare Other | Admitting: Radiation Oncology

## 2020-01-18 ENCOUNTER — Ambulatory Visit: Payer: Medicare Other | Admitting: Cardiology

## 2020-01-26 ENCOUNTER — Ambulatory Visit (INDEPENDENT_AMBULATORY_CARE_PROVIDER_SITE_OTHER): Payer: Medicare Other | Admitting: Cardiology

## 2020-01-26 ENCOUNTER — Other Ambulatory Visit: Payer: Self-pay

## 2020-01-26 VITALS — BP 104/56 | HR 85 | Ht 72.0 in | Wt 180.0 lb

## 2020-01-26 DIAGNOSIS — Z716 Tobacco abuse counseling: Secondary | ICD-10-CM

## 2020-01-26 DIAGNOSIS — R079 Chest pain, unspecified: Secondary | ICD-10-CM

## 2020-01-26 DIAGNOSIS — I429 Cardiomyopathy, unspecified: Secondary | ICD-10-CM | POA: Diagnosis not present

## 2020-01-26 DIAGNOSIS — R001 Bradycardia, unspecified: Secondary | ICD-10-CM | POA: Diagnosis not present

## 2020-01-26 DIAGNOSIS — Z7189 Other specified counseling: Secondary | ICD-10-CM | POA: Diagnosis not present

## 2020-01-26 MED ORDER — ASPIRIN EC 81 MG PO TBEC: 81.0000 mg | DELAYED_RELEASE_TABLET | Freq: Every day | ORAL | 3 refills | Status: AC

## 2020-01-26 NOTE — Progress Notes (Signed)
Cardiology Office Note:    Date:  01/26/2020   ID:  Gerald [name], DOB 09-Feb-1941, MRN [id number]  PCP:  Gerald Idler, MD  Cardiologist:  Gerald Dresser, MD  Referring MD: Gerald Idler, MD   Chief Complaint  Patient presents with  . New Patient (Initial Visit)    History of Present Illness:    Gerald [name] is a 79 y.o. male with a hx of right upper lobe lung carcinoma who is seen as a new consult at the request of Gerald Idler, MD for the evaluation and management of bradycardia.  Records received and reviewed from [facility]. Echo dated 01/11/20 has an incomplete report in the scanned records. Per Care Everywhere, summary was a technically difficult study, no effusion, globally decreased EF (reported at 40%), no significant valve disease.  Note from 11/29/19 from Dr. Carlis [name] notes ECG with second degree AV block, type 1. Pulse charted as 50 bpm. Planned for ambulatory monitor and echo.   [employer] monitor result report scanned. Notes 6 days of use. Per report, most severe heart block was first degree AV block, slowest rate during this was 63 bom. Did have PAC burden of 37%, PVC burden <1%, one episode of NSVT at 126 bpm (4 beats).   Today: Here with daughter today. I see his wife Gerald [name], who is present via speakerphone. He has no specific concerns today above those noted. His other main concern is fatigue. Has had radiation treatment for his lungs over the summer.   We reviewed his reduced EF on his echo, as well as his monitor. He does not recall ever being told before that his heart was abnormal. Has not had any recent symptoms.  He is still smoking, not interested in quitting.   Denies chest pain. No PND, orthopnea, LE edema or unexpected weight gain. No syncope or palpitations. Has been doing activity, lifting boxes in preparation for their move. Climb stairs regularly without issues. Breathing has been stable, baseline has dyspnea with minimal exertion. Wife  says he climbs stairs 20 times/day.   We discussed cath at length today, see below.  Blood pressure runs low over the last few months. Has not been symptomatic other than fatigue. Has had weight loss, down from peak of 220 lbs (40 lb weight loss).   No blood in stool or urine.  Just started pravachol, hadn't been on prior. Discussed intensifying based on results of cath.   Past Medical History:  Diagnosis Date  . Bursitis   . Carotid artery disease ([place])   . COPD (chronic obstructive pulmonary disease) ([place])   . Hypercholesterolemia   . Lung nodule   . Psoriasis   . Tobacco abuse   . Wears glasses    reading    Past Surgical History:  Procedure Laterality Date  . BRONCHIAL BIOPSY  09/27/2019   Procedure: BRONCHIAL BIOPSIES;  Surgeon: Gerald Nash, DO;  Location: [place] [facility];  Service: Pulmonary;;  . BRONCHIAL BRUSHINGS  09/27/2019   Procedure: BRONCHIAL BRUSHINGS;  Surgeon: Gerald Nash, DO;  Location: [place] [facility];  Service: Pulmonary;;  . BRONCHIAL NEEDLE ASPIRATION BIOPSY  09/27/2019   Procedure: BRONCHIAL NEEDLE ASPIRATION BIOPSIES;  Surgeon: Gerald Nash, DO;  Location: [place] [facility];  Service: Pulmonary;;  . BRONCHIAL WASHINGS  09/27/2019   Procedure: BRONCHIAL WASHINGS;  Surgeon: Gerald Nash, DO;  Location: [facility];  Service: Pulmonary;;  . COLONOSCOPY    . CRYOTHERAPY  09/27/2019   Procedure: CRYOTHERAPY;  Surgeon: Gerald Nash,  DO;  Location: [place] [facility];  Service: Pulmonary;;  . FIDUCIAL MARKER PLACEMENT  09/27/2019   Procedure: FIDUCIAL MARKER PLACEMENT;  Surgeon: Gerald Nash, DO;  Location: [facility];  Service: Pulmonary;;  . HEMOSTASIS CONTROL  09/27/2019   Procedure: HEMOSTASIS CONTROL;  Surgeon: Gerald Nash, DO;  Location: [place] [facility];  Service: Pulmonary;;  . HERNIA REPAIR    . VIDEO BRONCHOSCOPY WITH ENDOBRONCHIAL NAVIGATION N/A 09/27/2019   Procedure: VIDEO BRONCHOSCOPY WITH ENDOBRONCHIAL NAVIGATION;  Surgeon: Gerald Nash, DO;  Location: [facility];  Service: Pulmonary;  Laterality: N/A;    Current Medications: Current Outpatient Medications on File Prior to Visit  Medication Sig  . acitretin (SORIATANE) 25 MG capsule Take 25 mg by mouth daily.  Marland Kitchen albuterol (VENTOLIN HFA) 108 (90 Base) MCG/ACT inhaler Inhale 2 puffs into the lungs every 4 (four) hours as needed for wheezing.   . clobetasol cream (TEMOVATE) 5.63 % Apply 1 application topically daily as needed (Psoriasis).  . Multiple Vitamins-Minerals (ICAPS) TABS Take 1 tablet by mouth in the morning and at bedtime. IREDS   . omega-3 acid ethyl esters (LOVAZA) 1 g capsule   . Omega-3 Fatty Acids (FISH OIL) 1000 MG CAPS Take 1,000 mg by mouth daily.   . pravastatin (PRAVACHOL) 20 MG tablet Take 20 mg by mouth every evening.   . Tiotropium Bromide-Olodaterol (STIOLTO RESPIMAT) 2.5-2.5 MCG/ACT AERS Inhale 2 puffs into the lungs daily.   No current facility-administered medications on file prior to visit.     Allergies:   Patient has no known allergies.   Social History   Tobacco Use  . Smoking status: Current Every Day Smoker    Packs/day: 2.00    Types: Cigarettes  . Smokeless tobacco: Never Used  Vaping Use  . Vaping Use: Never used  Substance Use Topics  . Alcohol use: Yes    Comment: social  . Drug use: Never    Family History: family history includes Lung cancer in his brother; Mesothelioma (age of onset: 81) in his sister.  ROS:   Please see the history of present illness.  Additional pertinent ROS: Constitutional: Negative for chills, fever, night sweats. Positive for unintentional weight loss  HENT: Negative for ear pain and hearing loss.   Eyes: Negative for loss of vision and eye pain.  Respiratory: Negative for cough, sputum, wheezing.   Cardiovascular: See HPI. Gastrointestinal: Negative for abdominal pain, melena, and hematochezia.  Genitourinary: Negative for dysuria and hematuria.  Musculoskeletal: Negative for  falls and myalgias.  Skin: Negative for itching and rash.  Neurological: Negative for focal weakness, focal sensory changes and loss of consciousness.  Endo/Heme/Allergies: Does not bruise/bleed easily.     EKGs/Labs/Other Studies Reviewed:    The following studies were reviewed today: See HPI  EKG:  EKG is personally reviewed.  The ekg ordered today demonstrates SR with SA at 85 bpm  Recent Labs: 10/14/2019: ALT 11; BUN 19; Creatinine 1.03; Hemoglobin 16.3; Platelet Count 166; Potassium 4.3; Sodium 141  Recent Lipid Panel No results found for: CHOL, TRIG, HDL, CHOLHDL, VLDL, LDLCALC, LDLDIRECT  Physical Exam:    VS:  BP (!) 104/56 (BP Location: Left Arm, Patient Position: Sitting, Cuff Size: Normal)   Pulse 85   Ht 6' (1.829 m)   Wt 180 lb (81.6 kg)   BMI 24.41 kg/m     Wt Readings from Last 3 Encounters:  01/26/20 180 lb (81.6 kg)  12/19/19 177 lb 2 oz (80.3 kg)  10/26/19 180 lb 3.2  oz (81.7 kg)    GEN: Well nourished, well developed in no acute distress HEENT: Normal, moist mucous membranes NECK: No JVD CARDIAC: regular rhythm, normal S1 and S2, no rubs or gallops. No murmurs. VASCULAR: Radial and DP pulses 2+ bilaterally. No carotid bruits RESPIRATORY:  Clear to auscultation without rales, wheezing or rhonchi  ABDOMEN: Soft, non-tender, non-distended MUSCULOSKELETAL:  Ambulates independently SKIN: Warm and dry, no edema NEUROLOGIC:  Alert and oriented x 3. No focal neuro deficits noted. PSYCHIATRIC:  Normal affect    ASSESSMENT:    1. Cardiomyopathy, unspecified type ([place])   2. Bradycardia   3. Cardiac risk counseling   4. Counseling on health promotion and disease prevention   5. Tobacco abuse counseling    PLAN:    New cardiomyopathy -has never been told he has heart issues before -discussed possible etiologies at length -discussed workup, specifically right and left heart cath -Risks and benefits of cardiac catheterization have been discussed with the  patient.  These include bleeding, infection, kidney damage, stroke, heart attack, death.  The patient understands these risks and is willing to proceed. -labs ordered prior to cath -start aspirin today -currently on statin. If CAD is etiology for his cardiomyopathy, will need to intensify statin -no beta blocker given bradycardia -no blood pressure room for ACEi/ARB/ARNI/MRA  Bradycardia: based on monitor results, no high degree block. Avoid AV nodal agents  Tobacco use counseling: The patient was counseled on tobacco cessation today for 3 minutes.  Counseling included reviewing the risks of smoking tobacco products, how it impacts the patient's current medical diagnoses and different strategies for quitting.  Pharmacotherapy to aid in tobacco cessation was not prescribed today.  Cardiac risk counseling and prevention recommendations: -recommend heart healthy/Mediterranean diet, with whole grains, fruits, vegetable, fish, lean meats, nuts, and olive oil. Limit salt. -recommend moderate walking, 3-5 times/week for 30-50 minutes each session. Aim for at least 150 minutes.week. Goal should be pace of 3 miles/hours, or walking 1.5 miles in 30 minutes -recommend avoidance of tobacco products. Avoid excess alcohol. -ASCVD risk score: The ASCVD Risk score Mikey Bussing DC Jr., et al., 2013) failed to calculate for the following reasons:   The 2013 ASCVD risk score is only valid for ages 50 to 35    Plan for follow up: 3 weeks  Total time of encounter: 67 minutes total time of encounter, including 57 minutes spent in face-to-face patient care. This time includes coordination of care and counseling regarding test results and workup. Remainder of non-face-to-face time involved reviewing chart documents/testing relevant to the patient encounter and documentation in the medical record.  Gerald Dresser, MD, PhD [place]  [facility]   Medication Adjustments/Labs and Tests Ordered: Current  medicines are reviewed at length with the patient today.  Concerns regarding medicines are outlined above.  Orders Placed This Encounter  Procedures  . EKG 12-Lead   Meds ordered this encounter  Medications  . aspirin EC 81 MG tablet    Sig: Take 1 tablet (81 mg total) by mouth daily. Swallow whole.    Dispense:  90 tablet    Refill:  3    Patient Instructions  Medication Instructions:  Start Aspirin 81 mg  *If you need a refill on your cardiac medications before your next appointment, please call your pharmacy*   Lab Work: None ordered    Testing/Procedures: Your physician has requested that you have a cardiac catheterization. Cardiac catheterization is used to diagnose and/or treat various heart conditions. Doctors may recommend  this procedure for a number of different reasons. The most common reason is to evaluate chest pain. Chest pain can be a symptom of coronary artery disease (CAD), and cardiac catheterization can show whether plaque is narrowing or blocking your heart's arteries. This procedure is also used to evaluate the valves, as well as measure the blood flow and oxygen levels in different parts of your heart. For further information please visit HugeFiesta.tn. Please follow instruction sheet, as given. [place] will need to have the coronavirus test completed prior to your procedure. . This is a Drive Up Visit at [address], [place], [place] [address]. Please tell them that you are there for procedure testing. Stay in your car and someone will be with you shortly. Please make sure to have all other labs completed before this test because you will need to stay quarantined until your procedure. Will schedule once we have procedure date  Follow-Up: At [facility], you and your health needs are our priority.  As part of our continuing mission to provide you with exceptional heart care, we have created designated Provider Care Teams.  These  Care Teams include your primary Cardiologist (physician) and Advanced Practice Providers (APPs -  Physician Assistants and Nurse Practitioners) who all work together to provide you with the care you need, when you need it.  We recommend signing up for the patient portal called "MyChart".  Sign up information is provided on this After Visit Summary.  MyChart is used to connect with patients for Virtual Visits (Telemedicine).  Patients are able to view lab/test results, encounter notes, upcoming appointments, etc.  Non-urgent messages can be sent to your provider as well.   To learn more about what you can do with MyChart, go to [email or web address].    Your next appointment:   3 week(s)  The format for your next appointment:   In Person  Provider:   Buford Dresser, MD     [place] [address] Dept: [phone] Loc: [place]  01/26/2020  You are scheduled for a Cardiac Catheterization (Please call our office with best date to schedule).  1. Please arrive at the [facility] (Main Entrance A) at [facility]: [place], [place] [address] Free valet parking service is available.   Special note: Every effort is made to have your procedure done on time. Please understand that emergencies sometimes delay scheduled procedures.  2. Diet: Do not eat solid foods after midnight.  The patient may have clear liquids until 5am upon the day of the procedure.  3. Labs: You will need to have blood drawn on at Cancer center ( CBC, BMP).  4. Medication instructions in preparation for your procedure:   Contrast Allergy: No    On the morning of your procedure, take your Aspirin and any morning medicines NOT listed above.  You may use sips of water.  5. Plan for one night stay--bring personal belongings. 6. Bring a current list of your  medications and current insurance cards. 7. You MUST have a responsible person to drive you home. 8. Someone MUST be with you the first 24 hours after you arrive home or your discharge will be delayed. 9. Please wear clothes that are easy to get on and off and wear slip-on shoes.  Thank you for allowing [place] to care for you!   -- [place] [facility] services     Signed, Gerald Dresser, MD PhD 01/26/2020  [facility]

## 2020-01-26 NOTE — Patient Instructions (Addendum)
Medication Instructions:  Start Aspirin 81 mg  *If you need a refill on your cardiac medications before your next appointment, please call your pharmacy*   Lab Work: None ordered    Testing/Procedures: Your physician has requested that you have a cardiac catheterization. Cardiac catheterization is used to diagnose and/or treat various heart conditions. Doctors may recommend this procedure for a number of different reasons. The most common reason is to evaluate chest pain. Chest pain can be a symptom of coronary artery disease (CAD), and cardiac catheterization can show whether plaque is narrowing or blocking your heart's arteries. This procedure is also used to evaluate the valves, as well as measure the blood flow and oxygen levels in different parts of your heart. For further information please visit HugeFiesta.[email or web address]. Please follow instruction sheet, as given. [place] will need to have the coronavirus test completed prior to your procedure. . This is a Drive Up Visit at [address], [place], [place] [address]. Please tell them that you are there for procedure testing. Stay in your car and someone will be with you shortly. Please make sure to have all other labs completed before this test because you will need to stay quarantined until your procedure. Will schedule once we have procedure date  Follow-Up: At [facility], you and your health needs are our priority.  As part of our continuing mission to provide you with exceptional heart care, we have created designated Provider Care Teams.  These Care Teams include your primary Cardiologist (physician) and Advanced Practice Providers (APPs -  Physician Assistants and Nurse Practitioners) who all work together to provide you with the care you need, when you need it.  We recommend signing up for the patient portal called "MyChart".  Sign up information is provided on this After Visit Summary.  MyChart is used to  connect with patients for Virtual Visits (Telemedicine).  Patients are able to view lab/test results, encounter notes, upcoming appointments, etc.  Non-urgent messages can be sent to your provider as well.   To learn more about what you can do with MyChart, go to [email or web address].    Your next appointment:   3 week(s)  The format for your next appointment:   In Person  Provider:   Buford Dresser, MD     [place] [address] Dept: [phone] Loc: [place]  01/26/2020  You are scheduled for a Cardiac Catheterization (Please call our office with best date to schedule).  1. Please arrive at the [facility] (Main Entrance A) at [facility]: [place], [place] [address] Free valet parking service is available.   Special note: Every effort is made to have your procedure done on time. Please understand that emergencies sometimes delay scheduled procedures.  2. Diet: Do not eat solid foods after midnight.  The patient may have clear liquids until 5am upon the day of the procedure.  3. Labs: You will need to have blood drawn on at Cancer center ( CBC, BMP).  4. Medication instructions in preparation for your procedure:   Contrast Allergy: No    On the morning of your procedure, take your Aspirin and any morning medicines NOT listed above.  You may use sips of water.  5. Plan for one night stay--bring personal belongings. 6. Bring a current list of your medications and current insurance cards. 7. You MUST have a  responsible person to drive you home. 8. Someone MUST be with you the first 24 hours after you arrive home or your discharge will be delayed. 9. Please wear clothes that are easy to get on and off and wear slip-on shoes.  Thank you for allowing [place] to care for you!   -- [place] [facility] Cardi[facility]  services

## 2020-01-26 NOTE — H&P (View-Only) (Signed)
Cardiology Office Note:    Date:  01/26/2020   ID:  Gerald [name], DOB 07-16-1940, MRN [id number]  PCP:  Gerald Idler, MD  Cardiologist:  Gerald Dresser, MD  Referring MD: Gerald Idler, MD   Chief Complaint  Patient presents with  . New Patient (Initial Visit)    History of Present Illness:    Gerald [name] is a 79 y.o. male with a hx of right upper lobe lung carcinoma who is seen as a new consult at the request of Gerald Idler, MD for the evaluation and management of bradycardia.  Records received and reviewed from [facility]. Echo dated 01/11/20 has an incomplete report in the scanned records. Per Care Everywhere, summary was a technically difficult study, no effusion, globally decreased EF (reported at 40%), no significant valve disease.  Note from 11/29/19 from Dr. Carlis [name] notes ECG with second degree AV block, type 1. Pulse charted as 50 bpm. Planned for ambulatory monitor and echo.   [employer] monitor result report scanned. Notes 6 days of use. Per report, most severe heart block was first degree AV block, slowest rate during this was 63 bom. Did have PAC burden of 37%, PVC burden <1%, one episode of NSVT at 126 bpm (4 beats).   Today: Here with daughter today. I see his wife [name], who is present via speakerphone. He has no specific concerns today above those noted. His other main concern is fatigue. Has had radiation treatment for his lungs over the summer.   We reviewed his reduced EF on his echo, as well as his monitor. He does not recall ever being told before that his heart was abnormal. Has not had any recent symptoms.  He is still smoking, not interested in quitting.   Denies chest pain. No PND, orthopnea, LE edema or unexpected weight gain. No syncope or palpitations. Has been doing activity, lifting boxes in preparation for their move. Climb stairs regularly without issues. Breathing has been stable, baseline has dyspnea with minimal exertion. Wife  says he climbs stairs 20 times/day.   We discussed cath at length today, see below.  Blood pressure runs low over the last few months. Has not been symptomatic other than fatigue. Has had weight loss, down from peak of 220 lbs (40 lb weight loss).   No blood in stool or urine.  Just started pravachol, hadn't been on prior. Discussed intensifying based on results of cath.   Past Medical History:  Diagnosis Date  . Bursitis   . Carotid artery disease ([place])   . COPD (chronic obstructive pulmonary disease) ([place])   . Hypercholesterolemia   . Lung nodule   . Psoriasis   . Tobacco abuse   . Wears glasses    reading    Past Surgical History:  Procedure Laterality Date  . BRONCHIAL BIOPSY  09/27/2019   Procedure: BRONCHIAL BIOPSIES;  Surgeon: Garner Nash, DO;  Location: [place] [facility];  Service: Pulmonary;;  . BRONCHIAL BRUSHINGS  09/27/2019   Procedure: BRONCHIAL BRUSHINGS;  Surgeon: Garner Nash, DO;  Location: [place] [facility];  Service: Pulmonary;;  . BRONCHIAL NEEDLE ASPIRATION BIOPSY  09/27/2019   Procedure: BRONCHIAL NEEDLE ASPIRATION BIOPSIES;  Surgeon: Garner Nash, DO;  Location: [place] [facility];  Service: Pulmonary;;  . BRONCHIAL WASHINGS  09/27/2019   Procedure: BRONCHIAL WASHINGS;  Surgeon: Garner Nash, DO;  Location: [place] [facility];  Service: Pulmonary;;  . COLONOSCOPY    . CRYOTHERAPY  09/27/2019   Procedure: CRYOTHERAPY;  Surgeon: Garner Nash,  DO;  Location: [place] [facility];  Service: Pulmonary;;  . FIDUCIAL MARKER PLACEMENT  09/27/2019   Procedure: FIDUCIAL MARKER PLACEMENT;  Surgeon: Garner Nash, DO;  Location: [place] [facility];  Service: Pulmonary;;  . HEMOSTASIS CONTROL  09/27/2019   Procedure: HEMOSTASIS CONTROL;  Surgeon: Garner Nash, DO;  Location: [place] [facility];  Service: Pulmonary;;  . HERNIA REPAIR    . VIDEO BRONCHOSCOPY WITH ENDOBRONCHIAL NAVIGATION N/A 09/27/2019   Procedure: VIDEO BRONCHOSCOPY WITH ENDOBRONCHIAL NAVIGATION;  Surgeon: Garner Nash, DO;  Location: [place];  Service: Pulmonary;  Laterality: N/A;    Current Medications: Current Outpatient Medications on File Prior to Visit  Medication Sig  . acitretin (SORIATANE) 25 MG capsule Take 25 mg by mouth daily.  Marland Kitchen albuterol (VENTOLIN HFA) 108 (90 Base) MCG/ACT inhaler Inhale 2 puffs into the lungs every 4 (four) hours as needed for wheezing.   . clobetasol cream (TEMOVATE) 4.00 % Apply 1 application topically daily as needed (Psoriasis).  . Multiple Vitamins-Minerals (ICAPS) TABS Take 1 tablet by mouth in the morning and at bedtime. IREDS   . omega-3 acid ethyl esters (LOVAZA) 1 g capsule   . Omega-3 Fatty Acids (FISH OIL) 1000 MG CAPS Take 1,000 mg by mouth daily.   . pravastatin (PRAVACHOL) 20 MG tablet Take 20 mg by mouth every evening.   . Tiotropium Bromide-Olodaterol (STIOLTO RESPIMAT) 2.5-2.5 MCG/ACT AERS Inhale 2 puffs into the lungs daily.   No current facility-administered medications on file prior to visit.     Allergies:   Patient has no known allergies.   Social History   Tobacco Use  . Smoking status: Current Every Day Smoker    Packs/day: 2.00    Types: Cigarettes  . Smokeless tobacco: Never Used  Vaping Use  . Vaping Use: Never used  Substance Use Topics  . Alcohol use: Yes    Comment: social  . Drug use: Never    Family History: family history includes Lung cancer in his brother; Mesothelioma (age of onset: 108) in his sister.  ROS:   Please see the history of present illness.  Additional pertinent ROS: Constitutional: Negative for chills, fever, night sweats. Positive for unintentional weight loss  HENT: Negative for ear pain and hearing loss.   Eyes: Negative for loss of vision and eye pain.  Respiratory: Negative for cough, sputum, wheezing.   Cardiovascular: See HPI. Gastrointestinal: Negative for abdominal pain, melena, and hematochezia.  Genitourinary: Negative for dysuria and hematuria.  Musculoskeletal: Negative for  falls and myalgias.  Skin: Negative for itching and rash.  Neurological: Negative for focal weakness, focal sensory changes and loss of consciousness.  Endo/Heme/Allergies: Does not bruise/bleed easily.     EKGs/Labs/Other Studies Reviewed:    The following studies were reviewed today: See HPI  EKG:  EKG is personally reviewed.  The ekg ordered today demonstrates SR with SA at 85 bpm  Recent Labs: 10/14/2019: ALT 11; BUN 19; Creatinine 1.03; Hemoglobin 16.3; Platelet Count 166; Potassium 4.3; Sodium 141  Recent Lipid Panel No results found for: CHOL, TRIG, HDL, CHOLHDL, VLDL, LDLCALC, LDLDIRECT  Physical Exam:    VS:  BP (!) 104/56 (BP Location: Left Arm, Patient Position: Sitting, Cuff Size: Normal)   Pulse 85   Ht 6' (1.829 m)   Wt 180 lb (81.6 kg)   BMI 24.41 kg/m     Wt Readings from Last 3 Encounters:  01/26/20 180 lb (81.6 kg)  12/19/19 177 lb 2 oz (80.3 kg)  10/26/19 180 lb 3.2  oz (81.7 kg)    GEN: Well nourished, well developed in no acute distress HEENT: Normal, moist mucous membranes NECK: No JVD CARDIAC: regular rhythm, normal S1 and S2, no rubs or gallops. No murmurs. VASCULAR: Radial and DP pulses 2+ bilaterally. No carotid bruits RESPIRATORY:  Clear to auscultation without rales, wheezing or rhonchi  ABDOMEN: Soft, non-tender, non-distended MUSCULOSKELETAL:  Ambulates independently SKIN: Warm and dry, no edema NEUROLOGIC:  Alert and oriented x 3. No focal neuro deficits noted. PSYCHIATRIC:  Normal affect    ASSESSMENT:    1. Cardiomyopathy, unspecified type ([place])   2. Bradycardia   3. Cardiac risk counseling   4. Counseling on health promotion and disease prevention   5. Tobacco abuse counseling    PLAN:    New cardiomyopathy -has never been told he has heart issues before -discussed possible etiologies at length -discussed workup, specifically right and left heart cath -Risks and benefits of cardiac catheterization have been discussed with the  patient.  These include bleeding, infection, kidney damage, stroke, heart attack, death.  The patient understands these risks and is willing to proceed. -labs ordered prior to cath -start aspirin today -currently on statin. If CAD is etiology for his cardiomyopathy, will need to intensify statin -no beta blocker given bradycardia -no blood pressure room for ACEi/ARB/ARNI/MRA  Bradycardia: based on monitor results, no high degree block. Avoid AV nodal agents  Tobacco use counseling: The patient was counseled on tobacco cessation today for 3 minutes.  Counseling included reviewing the risks of smoking tobacco products, how it impacts the patient's current medical diagnoses and different strategies for quitting.  Pharmacotherapy to aid in tobacco cessation was not prescribed today.  Cardiac risk counseling and prevention recommendations: -recommend heart healthy/Mediterranean diet, with whole grains, fruits, vegetable, fish, lean meats, nuts, and olive oil. Limit salt. -recommend moderate walking, 3-5 times/week for 30-50 minutes each session. Aim for at least 150 minutes.week. Goal should be pace of 3 miles/hours, or walking 1.5 miles in 30 minutes -recommend avoidance of tobacco products. Avoid excess alcohol. -ASCVD risk score: The ASCVD Risk score Mikey Bussing DC Jr., et al., 2013) failed to calculate for the following reasons:   The 2013 ASCVD risk score is only valid for ages 37 to 64    Plan for follow up: 3 weeks  Total time of encounter: 67 minutes total time of encounter, including 57 minutes spent in face-to-face patient care. This time includes coordination of care and counseling regarding test results and workup. Remainder of non-face-to-face time involved reviewing chart documents/testing relevant to the patient encounter and documentation in the medical record.  Gerald Dresser, MD, PhD [place]  [facility]   Medication Adjustments/Labs and Tests Ordered: Current  medicines are reviewed at length with the patient today.  Concerns regarding medicines are outlined above.  Orders Placed This Encounter  Procedures  . EKG 12-Lead   Meds ordered this encounter  Medications  . aspirin EC 81 MG tablet    Sig: Take 1 tablet (81 mg total) by mouth daily. Swallow whole.    Dispense:  90 tablet    Refill:  3    Patient Instructions  Medication Instructions:  Start Aspirin 81 mg  *If you need a refill on your cardiac medications before your next appointment, please call your pharmacy*   Lab Work: None ordered    Testing/Procedures: Your physician has requested that you have a cardiac catheterization. Cardiac catheterization is used to diagnose and/or treat various heart conditions. Doctors may recommend  this procedure for a number of different reasons. The most common reason is to evaluate chest pain. Chest pain can be a symptom of coronary artery disease (CAD), and cardiac catheterization can show whether plaque is narrowing or blocking your heart's arteries. This procedure is also used to evaluate the valves, as well as measure the blood flow and oxygen levels in different parts of your heart. For further information please visit [email or web address].[email or web address]. Please follow instruction sheet, as given. [place] will need to have the coronavirus test completed prior to your procedure. . This is a Drive Up Visit at [address], [place], [place] [address]. Please tell them that you are there for procedure testing. Stay in your car and someone will be with you shortly. Please make sure to have all other labs completed before this test because you will need to stay quarantined until your procedure. Will schedule once we have procedure date  Follow-Up: At [facility], you and your health needs are our priority.  As part of our continuing mission to provide you with exceptional heart care, we have created designated Provider Care Teams.  These  Care Teams include your primary Cardiologist (physician) and Advanced Practice Providers (APPs -  Physician Assistants and Nurse Practitioners) who all work together to provide you with the care you need, when you need it.  We recommend signing up for the patient portal called "MyChart".  Sign up information is provided on this After Visit Summary.  MyChart is used to connect with patients for Virtual Visits (Telemedicine).  Patients are able to view lab/test results, encounter notes, upcoming appointments, etc.  Non-urgent messages can be sent to your provider as well.   To learn more about what you can do with MyChart, go to [email or web address].    Your next appointment:   3 week(s)  The format for your next appointment:   In Person  Provider:   Buford Dresser, MD     [place] [address] Dept: [phone] Loc: [place]  01/26/2020  You are scheduled for a Cardiac Catheterization (Please call our office with best date to schedule).  1. Please arrive at the [facility] (Main Entrance A) at [facility]: [place],  [address] Free valet parking service is available.   Special note: Every effort is made to have your procedure done on time. Please understand that emergencies sometimes delay scheduled procedures.  2. Diet: Do not eat solid foods after midnight.  The patient may have clear liquids until 5am upon the day of the procedure.  3. Labs: You will need to have blood drawn on at Cancer center ( CBC, BMP).  4. Medication instructions in preparation for your procedure:   Contrast Allergy: No    On the morning of your procedure, take your Aspirin and any morning medicines NOT listed above.  You may use sips of water.  5. Plan for one night stay--bring personal belongings. 6. Bring a current list of your  medications and current insurance cards. 7. You MUST have a responsible person to drive you home. 8. Someone MUST be with you the first 24 hours after you arrive home or your discharge will be delayed. 9. Please wear clothes that are easy to get on and off and wear slip-on shoes.  Thank you for allowing [place] to care for you!   -- [place]     Signed, Gerald Dresser, MD PhD 01/26/2020  [facility]

## 2020-01-27 ENCOUNTER — Telehealth: Payer: Self-pay | Admitting: Cardiology

## 2020-01-27 DIAGNOSIS — R079 Chest pain, unspecified: Secondary | ICD-10-CM

## 2020-01-27 DIAGNOSIS — Z01810 Encounter for preprocedural cardiovascular examination: Secondary | ICD-10-CM

## 2020-01-27 NOTE — Telephone Encounter (Signed)
Left message to call back  

## 2020-01-27 NOTE — Telephone Encounter (Signed)
Pt called and said that they need to talk to [employer]. Please call

## 2020-01-30 NOTE — Telephone Encounter (Addendum)
Spoke with the pts wife and she reports that they are back in the area [place]) next weekend. They are moving out of the [place] home.   They would like his R and L heart cath to be scheduled the week of 02/06/20.   Pt to have Covid test with labs 02/07/20 and cath 02/09/20 with Dr. Angelena Form at 9 am, 7:30 am arrival.   The cancer center has labs planned for 02/10/20 cmet and cbc.. pt to have at the [place] office 02/07/20.   Pt is on ASA 81mg .   I called Dr. Worthy Flank office [phone] re: the pt labs and the CT he has planned for him on 02/13/20... left message. To let them know that we are doing the labs 02/07/20 and to see if they are okay with them that far in advance of the CT they have planned and if they think too much dye too soon after cath 02/09/20.

## 2020-01-30 NOTE — Telephone Encounter (Signed)
Yes. CBC and CMET. Thank you

## 2020-01-30 NOTE — Telephone Encounter (Signed)
° °  Pt's wife calling back, she said they can't schedule the heart cath this week, they need to schedule it next week. She said to call her on her mobile #

## 2020-01-31 ENCOUNTER — Telehealth: Payer: Self-pay | Admitting: Medical Oncology

## 2020-01-31 ENCOUNTER — Telehealth: Payer: Self-pay | Admitting: Cardiology

## 2020-01-31 NOTE — Telephone Encounter (Signed)
Called the patient and spoke with wife. Advise to get lab work (BMET and CBC) and Covid test on 11/9 for cath on 11/11.   Advised to reach out Dr. Julien Nordmann office if she needs another lab prior to CT scan on 11/15.

## 2020-01-31 NOTE — Telephone Encounter (Signed)
Schedule message sent to r/s appts -lab , ct and f/u for nov 29th at wife's request.

## 2020-01-31 NOTE — Telephone Encounter (Signed)
Patient's wife states she is returning a call. May be in regards to procedure scheduled for 02/09/20. Please advise.

## 2020-02-01 NOTE — Telephone Encounter (Signed)
Spoke with the patient went over cardiac cath. Updated that CT scan rescheduled to 11/29 by oncology.

## 2020-02-01 NOTE — Telephone Encounter (Signed)
Patient's wife states she is returning another call regarding procedure instructions.

## 2020-02-03 NOTE — Telephone Encounter (Signed)
If his labs are within 30 days of scan ,he will not need another set of labs at our office   Routing comment    Per Gerald [name] nurse at the [facility]... pt wife advised that they should talk to the cancer center about when they would like the pt to have his CT since he is having his cath 12/10/19 and she has concerns that they are closing on their new house 11/15-11/16.

## 2020-02-07 ENCOUNTER — Other Ambulatory Visit (HOSPITAL_COMMUNITY)
Admission: RE | Admit: 2020-02-07 | Discharge: 2020-02-07 | Disposition: A | Discharge status: Home / Self Care | Payer: Medicare Other | Source: Ambulatory Visit | Attending: Cardiovascular Disease | Admitting: Cardiovascular Disease

## 2020-02-07 DIAGNOSIS — Z01812 Encounter for preprocedural laboratory examination: Secondary | ICD-10-CM | POA: Insufficient documentation

## 2020-02-07 DIAGNOSIS — Z20822 Contact with and (suspected) exposure to covid-19: Secondary | ICD-10-CM | POA: Diagnosis not present

## 2020-02-07 LAB — SARS CORONAVIRUS 2 (TAT 6-24 HRS): SARS Coronavirus 2: NEGATIVE

## 2020-02-08 ENCOUNTER — Telehealth: Payer: Self-pay | Admitting: *Deleted

## 2020-02-08 ENCOUNTER — Encounter: Payer: Self-pay | Admitting: Cardiology

## 2020-02-08 LAB — COMPREHENSIVE METABOLIC PANEL
ALT: 8 IU/L (ref 0–44)
AST: 10 IU/L (ref 0–40)
Albumin/Globulin Ratio: 1.8 (ref 1.2–2.2)
Albumin: 4.4 g/dL (ref 3.6–4.6)
Alkaline Phosphatase: 86 IU/L (ref 44–121)
BUN/Creatinine Ratio: 19 (ref 10–24)
BUN: 17 mg/dL (ref 8–27)
Bilirubin Total: 0.4 mg/dL (ref 0.0–1.2)
CO2: 27 mmol/L (ref 20–29)
Calcium: 9.4 mg/dL (ref 8.6–10.2)
Chloride: 98 mmol/L (ref 96–106)
Creatinine, Ser: 0.89 mg/dL (ref 0.76–1.27)
GFR calc Af Amer: 93 mL/min/{1.73_m2} (ref >59)
GFR calc non Af Amer: 80 mL/min/{1.73_m2} (ref >59)
Globulin, Total: 2.5 g/dL (ref 1.5–4.5)
Glucose: 71 mg/dL (ref 65–99)
Potassium: 4.9 mmol/L (ref 3.5–5.2)
Sodium: 139 mmol/L (ref 134–144)
Total Protein: 6.9 g/dL (ref 6.0–8.5)

## 2020-02-08 LAB — CBC
Hematocrit: 49.1 % (ref 37.5–51.0)
Hemoglobin: 16.9 g/dL (ref 13.0–17.7)
MCH: 32.5 pg (ref 26.6–33.0)
MCHC: 34.4 g/dL (ref 31.5–35.7)
MCV: 94 fL (ref 79–97)
Platelets: 185 10*3/uL (ref 150–450)
RBC: 5.2 x10E6/uL (ref 4.14–5.80)
RDW: 12.6 % (ref 11.6–15.4)
WBC: 7 10*3/uL (ref 3.4–10.8)

## 2020-02-08 NOTE — Telephone Encounter (Signed)
Error

## 2020-02-08 NOTE — Telephone Encounter (Addendum)
Pt contacted pre-catheterization scheduled at [facility] for: Thursday February 09, 2020 9 AM Verified arrival time and place: [facility]) at: 7 AM   No solid food after midnight prior to cath, clear liquids until 5 AM day of procedure.   AM meds can be  taken pre-cath with sips of water including: ASA 81 mg   Confirmed patient has responsible adult to drive home post procedure and be with patient first 24 hours after arriving home: yes  You are allowed ONE visitor in the waiting room during the time you are at the hospital for your procedure. Both you and your visitor must wear a mask once you enter the hospital.       COVID-19 Pre-Screening Questions:  . In the past 14 days have you had a new cough, new headache, new nasal congestion, fever (100.4 or greater) unexplained body aches, new sore throat, or sudden loss of taste or sense of smell? no . In the past 14 days have you been around anyone with known Covid 19? no   Reviewed procedure/mask/visitor instructions, COVID-19 questions reviewed with patient.

## 2020-02-09 ENCOUNTER — Other Ambulatory Visit: Payer: Self-pay

## 2020-02-09 ENCOUNTER — Encounter (HOSPITAL_COMMUNITY): Payer: Self-pay | Admitting: Cardiovascular Disease

## 2020-02-09 ENCOUNTER — Ambulatory Visit (HOSPITAL_COMMUNITY)
Admission: RE | Admit: 2020-02-09 | Discharge: 2020-02-09 | Disposition: A | Discharge status: Home / Self Care | Payer: Medicare Other | Attending: Cardiovascular Disease | Admitting: Cardiovascular Disease

## 2020-02-09 ENCOUNTER — Ambulatory Visit (HOSPITAL_COMMUNITY)
Admission: RE | Disposition: A | Discharge status: Home / Self Care | Payer: Self-pay | Source: Home / Self Care | Attending: Cardiovascular Disease

## 2020-02-09 DIAGNOSIS — I2582 Chronic total occlusion of coronary artery: Secondary | ICD-10-CM | POA: Insufficient documentation

## 2020-02-09 DIAGNOSIS — I429 Cardiomyopathy, unspecified: Secondary | ICD-10-CM | POA: Diagnosis not present

## 2020-02-09 DIAGNOSIS — I2510 Atherosclerotic heart disease of native coronary artery without angina pectoris: Secondary | ICD-10-CM

## 2020-02-09 DIAGNOSIS — R001 Bradycardia, unspecified: Secondary | ICD-10-CM | POA: Insufficient documentation

## 2020-02-09 DIAGNOSIS — I255 Ischemic cardiomyopathy: Secondary | ICD-10-CM

## 2020-02-09 DIAGNOSIS — Z79899 Other long term (current) drug therapy: Secondary | ICD-10-CM | POA: Insufficient documentation

## 2020-02-09 DIAGNOSIS — I25110 Atherosclerotic heart disease of native coronary artery with unstable angina pectoris: Secondary | ICD-10-CM | POA: Diagnosis not present

## 2020-02-09 DIAGNOSIS — F17210 Nicotine dependence, cigarettes, uncomplicated: Secondary | ICD-10-CM | POA: Diagnosis not present

## 2020-02-09 HISTORY — PX: RIGHT/LEFT HEART CATH AND CORONARY ANGIOGRAPHY: CATH118266

## 2020-02-09 LAB — POCT I-STAT 7, (LYTES, BLD GAS, ICA,H+H)
Acid-Base Excess: 2 mmol/L (ref 0.0–2.0)
Bicarbonate: 30.8 mmol/L — ABNORMAL HIGH (ref 20.0–28.0)
Calcium, Ion: 1.21 mmol/L (ref 1.15–1.40)
HCT: 46 % (ref 39.0–52.0)
Hemoglobin: 15.6 g/dL (ref 13.0–17.0)
O2 Saturation: 97 %
Potassium: 4.3 mmol/L (ref 3.5–5.1)
Sodium: 142 mmol/L (ref 135–145)
TCO2: 33 mmol/L — ABNORMAL HIGH (ref 22–32)
pCO2 arterial: 65.4 mmHg (ref 32.0–48.0)
pH, Arterial: 7.281 — ABNORMAL LOW (ref 7.350–7.450)
pO2, Arterial: 102 mmHg (ref 83.0–108.0)

## 2020-02-09 LAB — POCT I-STAT EG7
Acid-Base Excess: 2 mmol/L (ref 0.0–2.0)
Bicarbonate: 30.8 mmol/L — ABNORMAL HIGH (ref 20.0–28.0)
Calcium, Ion: 1.27 mmol/L (ref 1.15–1.40)
HCT: 47 % (ref 39.0–52.0)
Hemoglobin: 16 g/dL (ref 13.0–17.0)
O2 Saturation: 72 %
Potassium: 4.5 mmol/L (ref 3.5–5.1)
Sodium: 142 mmol/L (ref 135–145)
TCO2: 33 mmol/L — ABNORMAL HIGH (ref 22–32)
pCO2, Ven: 65.9 mmHg — ABNORMAL HIGH (ref 44.0–60.0)
pH, Ven: 7.277 (ref 7.250–7.430)
pO2, Ven: 44 mmHg (ref 32.0–45.0)

## 2020-02-09 SURGERY — RIGHT/LEFT HEART CATH AND CORONARY ANGIOGRAPHY
Anesthesia: LOCAL

## 2020-02-09 MED ORDER — SODIUM CHLORIDE 0.9 % IV SOLN: 250.0000 mL | INTRAVENOUS | Status: DC | PRN

## 2020-02-09 MED ORDER — LIDOCAINE HCL (PF) 1 % IJ SOLN
INTRAMUSCULAR | Status: DC | PRN
  Administered 2020-02-09: 2 mL
  Administered 2020-02-09: 15 mL
  Administered 2020-02-09: 2 mL

## 2020-02-09 MED ORDER — FENTANYL CITRATE (PF) 100 MCG/2ML IJ SOLN
INTRAMUSCULAR | Status: DC | PRN
  Administered 2020-02-09 (×2): 25 ug via INTRAVENOUS

## 2020-02-09 MED ORDER — SODIUM CHLORIDE 0.9% FLUSH: 3.0000 mL | INTRAVENOUS | Status: DC | PRN

## 2020-02-09 MED ORDER — IOHEXOL 350 MG/ML SOLN
INTRAVENOUS | Status: DC | PRN
  Administered 2020-02-09: 65 mL

## 2020-02-09 MED ORDER — ONDANSETRON HCL 4 MG/2ML IJ SOLN: 4.0000 mg | Freq: Four times a day (QID) | INTRAMUSCULAR | Status: DC | PRN

## 2020-02-09 MED ORDER — SODIUM CHLORIDE 0.9% FLUSH: 3.0000 mL | Freq: Two times a day (BID) | INTRAVENOUS | Status: DC

## 2020-02-09 MED ORDER — ACETAMINOPHEN 325 MG PO TABS: 650.0000 mg | ORAL_TABLET | ORAL | Status: DC | PRN

## 2020-02-09 MED ORDER — HEPARIN (PORCINE) IN NACL 1000-0.9 UT/500ML-% IV SOLN
INTRAVENOUS | Status: DC | PRN
  Administered 2020-02-09 (×2): 500 mL

## 2020-02-09 MED ORDER — HYDRALAZINE HCL 20 MG/ML IJ SOLN: 10.0000 mg | INTRAMUSCULAR | Status: DC | PRN

## 2020-02-09 MED ORDER — MIDAZOLAM HCL 2 MG/2ML IJ SOLN
INTRAMUSCULAR | Status: DC | PRN
  Administered 2020-02-09 (×2): 1 mg via INTRAVENOUS

## 2020-02-09 MED ORDER — HEPARIN SODIUM (PORCINE) 1000 UNIT/ML IJ SOLN
INTRAMUSCULAR | Status: AC
  Filled 2020-02-09: qty 1

## 2020-02-09 MED ORDER — MIDAZOLAM HCL 2 MG/2ML IJ SOLN
INTRAMUSCULAR | Status: AC
  Filled 2020-02-09: qty 2

## 2020-02-09 MED ORDER — SODIUM CHLORIDE 0.9 % IV SOLN: INTRAVENOUS | Status: AC

## 2020-02-09 MED ORDER — ASPIRIN 81 MG PO CHEW: 81.0000 mg | CHEWABLE_TABLET | ORAL | Status: DC

## 2020-02-09 MED ORDER — FENTANYL CITRATE (PF) 100 MCG/2ML IJ SOLN
INTRAMUSCULAR | Status: AC
  Filled 2020-02-09: qty 2

## 2020-02-09 MED ORDER — LIDOCAINE HCL (PF) 1 % IJ SOLN
INTRAMUSCULAR | Status: AC
  Filled 2020-02-09: qty 30

## 2020-02-09 MED ORDER — VERAPAMIL HCL 2.5 MG/ML IV SOLN
INTRAVENOUS | Status: AC
  Filled 2020-02-09: qty 2

## 2020-02-09 MED ORDER — LABETALOL HCL 5 MG/ML IV SOLN: 10.0000 mg | INTRAVENOUS | Status: DC | PRN

## 2020-02-09 MED ORDER — SODIUM CHLORIDE 0.9 % IV SOLN: INTRAVENOUS | Status: DC

## 2020-02-09 SURGICAL SUPPLY — 17 items
CATH INFINITI 5 FR AL2 (CATHETERS) ×1 IMPLANT
CATH INFINITI 5FR MULTPACK ANG (CATHETERS) ×1 IMPLANT
CATH SWAN GANZ 7F STRAIGHT (CATHETERS) ×1 IMPLANT
CLOSURE MYNX CONTROL 5F (Vascular Products) ×1 IMPLANT
GLIDESHEATH SLEND SS 6F .021 (SHEATH) ×1 IMPLANT
GUIDEWIRE INQWIRE 1.5J.035X260 (WIRE) IMPLANT
INQWIRE 1.5J .035X260CM (WIRE)
KIT HEART LEFT (KITS) ×2 IMPLANT
PACK CARDIAC CATHETERIZATION (CUSTOM PROCEDURE TRAY) ×2 IMPLANT
SHEATH GLIDE SLENDER 4/5FR (SHEATH) ×1 IMPLANT
SHEATH PINNACLE 5F 10CM (SHEATH) ×1 IMPLANT
SHEATH PINNACLE 7F 10CM (SHEATH) ×1 IMPLANT
SHEATH PROBE COVER 6X72 (BAG) ×1 IMPLANT
TRANSDUCER W/STOPCOCK (MISCELLANEOUS) ×2 IMPLANT
TUBING CIL FLEX 10 FLL-RA (TUBING) ×2 IMPLANT
WIRE EMERALD 3MM-J .035X150CM (WIRE) ×1 IMPLANT
WIRE EMERALD ST .035X150CM (WIRE) ×1 IMPLANT

## 2020-02-09 NOTE — Interval H&P Note (Signed)
History and Physical Interval Note:  02/09/2020 8:45 AM  Gerald [name]  has presented today for surgery, with the diagnosis of unstable angina.  The various methods of treatment have been discussed with the patient and family. After consideration of risks, benefits and other options for treatment, the patient has consented to  Procedure(s): RIGHT/LEFT HEART CATH AND CORONARY ANGIOGRAPHY (N/A) as a surgical intervention.  The patient's history has been reviewed, patient examined, no change in status, stable for surgery.  I have reviewed the patient's chart and labs.  Questions were answered to the patient's satisfaction.    Cath Lab Visit (complete for each Cath Lab visit)  Clinical Evaluation Leading to the Procedure:   ACS: No.  Non-ACS:    Anginal Classification: CCS II  Anti-ischemic medical therapy: No Therapy  Non-Invasive Test Results: No non-invasive testing performed  Prior CABG: No previous CABG        Lauree Chandler

## 2020-02-09 NOTE — Discharge Instructions (Signed)

## 2020-02-10 MED FILL — Verapamil HCl IV Soln 2.5 MG/ML: INTRAVENOUS | Qty: 2 | Status: AC

## 2020-02-10 MED FILL — Heparin Sodium (Porcine) Inj 1000 Unit/ML: INTRAMUSCULAR | Qty: 10 | Status: AC

## 2020-02-13 ENCOUNTER — Ambulatory Visit (HOSPITAL_COMMUNITY): Payer: Medicare Other

## 2020-02-13 ENCOUNTER — Inpatient Hospital Stay: Payer: Medicare Other

## 2020-02-14 ENCOUNTER — Ambulatory Visit: Payer: Medicare Other | Admitting: Internal Medicine

## 2020-02-27 ENCOUNTER — Encounter: Payer: Self-pay | Admitting: Cardiology

## 2020-02-27 ENCOUNTER — Inpatient Hospital Stay: Payer: Medicare Other

## 2020-02-27 ENCOUNTER — Telehealth: Payer: Self-pay

## 2020-02-27 ENCOUNTER — Ambulatory Visit (INDEPENDENT_AMBULATORY_CARE_PROVIDER_SITE_OTHER): Payer: Medicare Other | Admitting: Cardiology

## 2020-02-27 ENCOUNTER — Other Ambulatory Visit: Payer: Self-pay

## 2020-02-27 VITALS — BP 100/46 | HR 43 | Ht 72.0 in | Wt 183.8 lb

## 2020-02-27 DIAGNOSIS — E7800 Pure hypercholesterolemia, unspecified: Secondary | ICD-10-CM | POA: Diagnosis not present

## 2020-02-27 DIAGNOSIS — R001 Bradycardia, unspecified: Secondary | ICD-10-CM | POA: Diagnosis not present

## 2020-02-27 DIAGNOSIS — I255 Ischemic cardiomyopathy: Secondary | ICD-10-CM | POA: Diagnosis not present

## 2020-02-27 DIAGNOSIS — Z720 Tobacco use: Secondary | ICD-10-CM

## 2020-02-27 DIAGNOSIS — I2510 Atherosclerotic heart disease of native coronary artery without angina pectoris: Secondary | ICD-10-CM | POA: Diagnosis not present

## 2020-02-27 DIAGNOSIS — I2584 Coronary atherosclerosis due to calcified coronary lesion: Secondary | ICD-10-CM

## 2020-02-27 DIAGNOSIS — Z7189 Other specified counseling: Secondary | ICD-10-CM

## 2020-02-27 MED ORDER — ROSUVASTATIN CALCIUM 20 MG PO TABS: 20.0000 mg | ORAL_TABLET | Freq: Every day | ORAL | 11 refills | Status: DC

## 2020-02-27 NOTE — Telephone Encounter (Signed)
Pts wife called me cell phone asking about the pts upcoming appts today.. I advised her that the pt has an appt for labs for Oncology at 10:45am and he has an appt with Dr. Harrell Gave for his post cath at 1:40 pm.

## 2020-02-27 NOTE — Progress Notes (Signed)
Cardiology Office Note:    Date:  02/27/2020   ID:  Gerald [name], DOB Aug 29, 1940, MRN [id number]  PCP:  Olam Idler, MD  Cardiologist:  Buford Dresser, MD  Referring MD: Olam Idler, MD   CC: follow up post cath  History of Present Illness:    Gerald [name] is a 79 y.o. male with a hx of right upper lobe lung carcinoma who is seen for follow up today. I initially met him 01/26/20 as a new consult at the request of Olam Idler, MD for the evaluation and management of bradycardia.  Records received and reviewed from [facility]. Echo dated 01/11/20 has an incomplete report in the scanned records. Per Care Everywhere, summary was a technically difficult study, no effusion, globally decreased EF (reported at 40%), no significant valve disease.  Note from 11/29/19 from Dr. Carlis Abbott notes ECG with second degree AV block, type 1. Pulse charted as 50 bpm. Planned for ambulatory monitor and echo.   [employer] monitor result report scanned. Notes 6 days of use. Per report, most severe heart block was first degree AV block, slowest rate during this was 63 bom. Did have PAC burden of 37%, PVC burden <1%, one episode of NSVT at 126 bpm (4 beats).   Today: We reviewed the results of the cath today. Daughter here in exam room today, wife present via speakerphone. Discussed the extent of his CAD. He remains asymptomatic.  We discussed tobacco cessation, he is precontemplative.   Discussed medical management. From a cardiomyopathy perspective, bradycardia limits use of beta blocker and hypotension/lightheadedness limits ACEi/ARB/ARNI/MRA. He is taking aspirin 81 mg daily without issue.   He remotely had an issue with a medication in the past, wife not sure which one it was but crestor sounds familiar to her.   No shortness of breath or chest pain. His wife and daughter have been watching him closely, trying to not overexert himself. Lightheaded nearly every morning, has to change  position slowly. Doesn't drink water as it sends him right to the bathroom. No pain after eating (except when he eats too much). No blood in the stool or blood in the urine.   Denies chest pain, shortness of breath at rest or with normal exertion. No PND, orthopnea, LE edema or unexpected weight gain. No syncope or palpitations.   Past Medical History:  Diagnosis Date  . Bursitis   . Carotid artery disease ([place])   . COPD (chronic obstructive pulmonary disease) ([place])   . Hypercholesterolemia   . Lung nodule   . Psoriasis   . Tobacco abuse   . Wears glasses    reading    Past Surgical History:  Procedure Laterality Date  . BRONCHIAL BIOPSY  09/27/2019   Procedure: BRONCHIAL BIOPSIES;  Surgeon: Garner Nash, DO;  Location: [place] [facility];  Service: Pulmonary;;  . BRONCHIAL BRUSHINGS  09/27/2019   Procedure: BRONCHIAL BRUSHINGS;  Surgeon: Garner Nash, DO;  Location: [place] [facility];  Service: Pulmonary;;  . BRONCHIAL NEEDLE ASPIRATION BIOPSY  09/27/2019   Procedure: BRONCHIAL NEEDLE ASPIRATION BIOPSIES;  Surgeon: Garner Nash, DO;  Location: [place] [facility];  Service: Pulmonary;;  . BRONCHIAL WASHINGS  09/27/2019   Procedure: BRONCHIAL WASHINGS;  Surgeon: Garner Nash, DO;  Location: [place] [facility];  Service: Pulmonary;;  . COLONOSCOPY    . CRYOTHERAPY  09/27/2019   Procedure: CRYOTHERAPY;  Surgeon: Garner Nash, DO;  Location: [place] [facility];  Service: Pulmonary;;  . FIDUCIAL MARKER PLACEMENT  09/27/2019   Procedure:  FIDUCIAL MARKER PLACEMENT;  Surgeon: Garner Nash, DO;  Location: [place] [facility];  Service: Pulmonary;;  . HEMOSTASIS CONTROL  09/27/2019   Procedure: HEMOSTASIS CONTROL;  Surgeon: Garner Nash, DO;  Location: [place] [facility];  Service: Pulmonary;;  . HERNIA REPAIR    . RIGHT/LEFT HEART CATH AND CORONARY ANGIOGRAPHY N/A 02/09/2020   Procedure: RIGHT/LEFT HEART CATH AND CORONARY ANGIOGRAPHY;  Surgeon: Burnell Blanks, MD;  Location: [place] [facility];   Service: Cardiovascular;  Laterality: N/A;  . VIDEO BRONCHOSCOPY WITH ENDOBRONCHIAL NAVIGATION N/A 09/27/2019   Procedure: VIDEO BRONCHOSCOPY WITH ENDOBRONCHIAL NAVIGATION;  Surgeon: Garner Nash, DO;  Location: [place];  Service: Pulmonary;  Laterality: N/A;    Current Medications: Current Outpatient Medications on File Prior to Visit  Medication Sig  . acitretin (SORIATANE) 25 MG capsule Take 25 mg by mouth daily.  Marland Kitchen aspirin EC 81 MG tablet Take 1 tablet (81 mg total) by mouth daily. Swallow whole.  . clobetasol cream (TEMOVATE) 4.09 % Apply 1 application topically daily as needed (Psoriasis).  . Multiple Vitamins-Minerals (ICAPS) TABS Take 1 tablet by mouth in the morning and at bedtime. IREDS   . omega-3 acid ethyl esters (LOVAZA) 1 g capsule   . Omega-3 Fatty Acids (FISH OIL) 1000 MG CAPS Take 1,000 mg by mouth daily.   Marland Kitchen albuterol (VENTOLIN HFA) 108 (90 Base) MCG/ACT inhaler Inhale 2 puffs into the lungs every 4 (four) hours as needed for wheezing.  (Patient not taking: Reported on 02/27/2020)  . Tiotropium Bromide-Olodaterol (STIOLTO RESPIMAT) 2.5-2.5 MCG/ACT AERS Inhale 2 puffs into the lungs daily. (Patient not taking: Reported on 02/27/2020)   No current facility-administered medications on file prior to visit.     Allergies:   Patient has no known allergies.   Social History   Tobacco Use  . Smoking status: Current Every Day Smoker    Packs/day: 2.00    Types: Cigarettes  . Smokeless tobacco: Never Used  Vaping Use  . Vaping Use: Never used  Substance Use Topics  . Alcohol use: Yes    Comment: social  . Drug use: Never    Family History: family history includes Lung cancer in his brother; Mesothelioma (age of onset: 79) in his sister.  ROS:   Please see the history of present illness.  Additional pertinent ROS otherwise unremarkable.     EKGs/Labs/Other Studies Reviewed:    The following studies were reviewed today: Outside testing summarized in  HPI Cardiac cath 02/09/20  Mid Cx to Dist Cx lesion is 100% stenosed.  2nd Mrg lesion is 99% stenosed.  Ost LM to Dist LM lesion is 30% stenosed.  Prox LAD to Mid LAD lesion is 30% stenosed.  Ost Cx to Mid Cx lesion is 30% stenosed.  Prox RCA lesion is 99% stenosed.   1. The LAD is a large caliber vessel that courses to the apex. Diffuse mild calcified disease without any focally obstructive lesions.  2. The Circumflex is a large dominant vessel. There is heavy calcification in the proximal and mid vessel. The mid Circumflex is chronically occluded just beyond a small caliber obtuse marginal branch. The left sided PDA fills from left to left collaterals.  3. The small non-dominant RCA has severe diffuse proximal and mid vessel disease. This non-dominant vessel is too small for PCI.  4. Normal right and left heart pressures  Recommendations: Medical management of CAD. His mid Circumflex is chronically occluded and fills briskly from left to left collaterals. The RCA is a  small non-dominant vessel with severe diffuse proximal and mid disease. Could consider viability study to assess the lateral wall and if viable myocardium, review his films with the CTO team to consider PCI of the Circumflex. He seems to be asymptomatic at this time so the best approach may be medical management of his CAD without an attempt at PCI.   EKG:  EKG is personally reviewed.  The ekg ordered 01/26/20 demonstrates SR with SA at 85 bpm  Recent Labs: 02/07/2020: ALT 8; BUN 17; Creatinine, Ser 0.89; Platelets 185 02/09/2020: Hemoglobin 16.0; Potassium 4.5; Sodium 142  Recent Lipid Panel No results found for: CHOL, TRIG, HDL, CHOLHDL, VLDL, LDLCALC, LDLDIRECT  Physical Exam:    VS:  BP (!) 100/46 (BP Location: Right Arm, Patient Position: Sitting)   Pulse (!) 43   Ht 6' (1.829 m)   Wt 183 lb 12.8 oz (83.4 kg)   SpO2 97%   BMI 24.93 kg/m     Wt Readings from Last 3 Encounters:  02/27/20 183 lb 12.8 oz  (83.4 kg)  02/09/20 180 lb (81.6 kg)  01/26/20 180 lb (81.6 kg)    GEN: Well nourished, well developed in no acute distress HEENT: Normal, moist mucous membranes NECK: No JVD CARDIAC: regular rhythm, normal S1 and S2, no rubs or gallops. No murmur. VASCULAR: Radial and DP pulses 2+ bilaterally. No carotid bruits RESPIRATORY:  Clear to auscultation without rales, wheezing or rhonchi  ABDOMEN: Soft, non-tender, non-distended MUSCULOSKELETAL:  Ambulates independently SKIN: Warm and dry, no edema NEUROLOGIC:  Alert and oriented x 3. No focal neuro deficits noted. PSYCHIATRIC:  Normal affect   ASSESSMENT:    1. Coronary artery disease due to calcified coronary lesion   2. Ischemic cardiomyopathy   3. Pure hypercholesterolemia   4. Bradycardia   5. Counseling on health promotion and disease prevention   6. Cardiac risk counseling   7. Tobacco abuse    PLAN:    Coronary artery disease, with CTO and collaterals as above Ischemic cardiomyopathy Hypercholesterolemia, goal LDL <70 -tolerating aspirin, continue -given CAD, changing from pravastatin to rosuvastatin today. Check lipids in 3 mos -no beta blocker given bradycardia -no blood pressure room for ACEi/ARB/ARNI/MRA -asymptomatic -discussed sublingual nitroglycerin today. He states he would not use, and given his resting hypotension would be better for him to be monitored. Recommended he call 911 with severe chest discomfort or shortness of breath -counseled on signs of heart failure, what to watch for, daily weights, etc  Bradycardia: based on monitor results, no high degree block. Avoid AV nodal agents  Tobacco use counseling: counseled on importance of this. He is precontemplative  Cardiac risk counseling and prevention recommendations: -recommend heart healthy/Mediterranean diet, with whole grains, fruits, vegetable, fish, lean meats, nuts, and olive oil. Limit salt. -recommend moderate walking, 3-5 times/week for 30-50  minutes each session. Aim for at least 150 minutes.week. Goal should be pace of 3 miles/hours, or walking 1.5 miles in 30 minutes -recommend avoidance of tobacco products. Avoid excess alcohol. -ASCVD risk score: The ASCVD Risk score Mikey Bussing DC Jr., et al., 2013) failed to calculate for the following reasons:   The 2013 ASCVD risk score is only valid for ages 58 to 49    Plan for follow up: 3 mos or sooner as needed  Total time of encounter: 45 minutes total time of encounter, including 37 minutes spent in face-to-face patient care. This time includes coordination of care and counseling regarding coronary disease, cardiomyopathy. Remainder of non-face-to-face time involved reviewing  chart documents/testing relevant to the patient encounter and documentation in the medical record.  Buford Dresser, MD, PhD [place]  [facility]   Buford Dresser, MD, PhD [place]  [facility]   Medication Adjustments/Labs and Tests Ordered: Current medicines are reviewed at length with the patient today.  Concerns regarding medicines are outlined above.  No orders of the defined types were placed in this encounter.  Meds ordered this encounter  Medications  . rosuvastatin (CRESTOR) 20 MG tablet    Sig: Take 1 tablet (20 mg total) by mouth daily.    Dispense:  30 tablet    Refill:  11    Replaces pravastatin    Patient Instructions  Medication Instructions:  Stop Pravastatin Start Rosuvastatin  *If you need a refill on your cardiac medications before your next appointment, please call your pharmacy*  Lab Work: None ordered this visit  Testing/Procedures: None ordered this visit  Follow-Up: At [facility], you and your health needs are our priority.  As part of our continuing mission to provide you with exceptional heart care, we have created designated Provider Care Teams.  These Care Teams include your primary Cardiologist (physician) and Advanced Practice  Providers (APPs -  Physician Assistants and Nurse Practitioners) who all work together to provide you with the care you need, when you need it.  We recommend signing up for the patient portal called "MyChart".  Sign up information is provided on this After Visit Summary.  MyChart is used to connect with patients for Virtual Visits (Telemedicine).  Patients are able to view lab/test results, encounter notes, upcoming appointments, etc.  Non-urgent messages can be sent to your provider as well.   To learn more about what you can do with MyChart, go to [email or web address].    Your next appointment:   3 month(s)  The format for your next appointment:   In Person  Provider:   Buford Dresser, MD    Signed, Buford Dresser, MD PhD 02/27/2020  [place]

## 2020-02-27 NOTE — Patient Instructions (Signed)
Medication Instructions:  Stop Pravastatin Start Rosuvastatin  *If you need a refill on your cardiac medications before your next appointment, please call your pharmacy*  Lab Work: None ordered this visit  Testing/Procedures: None ordered this visit  Follow-Up: At [facility], you and your health needs are our priority.  As part of our continuing mission to provide you with exceptional heart care, we have created designated Provider Care Teams.  These Care Teams include your primary Cardiologist (physician) and Advanced Practice Providers (APPs -  Physician Assistants and Nurse Practitioners) who all work together to provide you with the care you need, when you need it.  We recommend signing up for the patient portal called "MyChart".  Sign up information is provided on this After Visit Summary.  MyChart is used to connect with patients for Virtual Visits (Telemedicine).  Patients are able to view lab/test results, encounter notes, upcoming appointments, etc.  Non-urgent messages can be sent to your provider as well.   To learn more about what you can do with MyChart, go to [email or web address].    Your next appointment:   3 month(s)  The format for your next appointment:   In Person  Provider:   Buford Dresser, MD

## 2020-02-28 ENCOUNTER — Ambulatory Visit (HOSPITAL_COMMUNITY)
Admission: RE | Admit: 2020-02-28 | Discharge: 2020-02-28 | Disposition: A | Discharge status: Home / Self Care | Payer: Medicare Other | Source: Ambulatory Visit | Attending: Internal Medicine | Admitting: Internal Medicine

## 2020-02-28 ENCOUNTER — Encounter (HOSPITAL_COMMUNITY): Payer: Self-pay

## 2020-02-28 DIAGNOSIS — C3490 Malignant neoplasm of unspecified part of unspecified bronchus or lung: Secondary | ICD-10-CM | POA: Diagnosis not present

## 2020-02-28 HISTORY — DX: Malignant (primary) neoplasm, unspecified: C80.1

## 2020-02-28 MED ORDER — IOHEXOL 300 MG/ML  SOLN
75.0000 mL | Freq: Once | INTRAMUSCULAR | Status: AC | PRN
  Administered 2020-02-28: 75 mL via INTRAVENOUS

## 2020-02-29 ENCOUNTER — Encounter: Payer: Self-pay | Admitting: Internal Medicine

## 2020-02-29 ENCOUNTER — Inpatient Hospital Stay: Payer: Medicare Other | Attending: Internal Medicine | Admitting: Internal Medicine

## 2020-02-29 ENCOUNTER — Telehealth: Payer: Self-pay | Admitting: Internal Medicine

## 2020-02-29 ENCOUNTER — Other Ambulatory Visit: Payer: Self-pay

## 2020-02-29 VITALS — BP 140/57 | HR 55 | Temp 97.8°F | Resp 20 | Ht 72.0 in | Wt 184.1 lb

## 2020-02-29 DIAGNOSIS — C3490 Malignant neoplasm of unspecified part of unspecified bronchus or lung: Secondary | ICD-10-CM | POA: Diagnosis not present

## 2020-02-29 DIAGNOSIS — I255 Ischemic cardiomyopathy: Secondary | ICD-10-CM

## 2020-02-29 DIAGNOSIS — C3431 Malignant neoplasm of lower lobe, right bronchus or lung: Secondary | ICD-10-CM | POA: Diagnosis not present

## 2020-02-29 DIAGNOSIS — C3411 Malignant neoplasm of upper lobe, right bronchus or lung: Secondary | ICD-10-CM | POA: Diagnosis present

## 2020-02-29 NOTE — Telephone Encounter (Signed)
Scheduled per los. Gave avs and calendar  

## 2020-02-29 NOTE — Progress Notes (Signed)
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  PRIOR THERAPY: SBRT to the right upper lobe pulmonary nodule as well as the right lower lobe pulmonary nodule under the care of Dr. Sondra Come completed on 11/14/2019.  CURRENT THERAPY: Observation.  INTERVAL HISTORY: Gerald [name] 79 y.o. male returns to the clinic today for follow-up visit accompanied by his wife and daughter.  The patient is feeling fine today with no concerning complaints.  He tolerated his previous SBRT to the right lung nodule fairly well.  He denied having any current chest pain, shortness of breath but has cough with no hemoptysis.  He denied having any recent weight loss or night sweats.  He has no nausea, vomiting, diarrhea or constipation.  He has no headache or visual changes.  The patient had repeat CT scan of the chest performed recently and he is here today for evaluation and discussion of his scan results.  MEDICAL HISTORY: Past Medical History:  Diagnosis Date  . Bursitis   . Carotid artery disease ([place])   . COPD (chronic obstructive pulmonary disease) ([place])   . Hypercholesterolemia   . Lung nodule   . nscl ca dx'd 08/2019  . Psoriasis   . Tobacco abuse   . Wears glasses    reading    ALLERGIES:  has No Known Allergies.  MEDICATIONS:  Current Outpatient Medications  Medication Sig Dispense Refill  . acitretin (SORIATANE) 25 MG capsule Take 25 mg by mouth daily.    Marland Kitchen aspirin EC 81 MG tablet Take 1 tablet (81 mg total) by mouth daily. Swallow whole. 90 tablet 3  . clobetasol cream (TEMOVATE) 0.94 % Apply 1 application topically daily as needed (Psoriasis).    . Multiple Vitamins-Minerals  (ICAPS) TABS Take 1 tablet by mouth in the morning and at bedtime. IREDS     . omega-3 acid ethyl esters (LOVAZA) 1 g capsule     . Omega-3 Fatty Acids (FISH OIL) 1000 MG CAPS Take 1,000 mg by mouth daily.     . rosuvastatin (CRESTOR) 20 MG tablet Take 1 tablet (20 mg total) by mouth daily. 30 tablet 11  . albuterol (VENTOLIN HFA) 108 (90 Base) MCG/ACT inhaler Inhale 2 puffs into the lungs every 4 (four) hours as needed for wheezing.  (Patient not taking: Reported on 02/27/2020)    . Tiotropium Bromide-Olodaterol (STIOLTO RESPIMAT) 2.5-2.5 MCG/ACT AERS Inhale 2 puffs into the lungs daily. (Patient not taking: Reported on 02/27/2020) 4 g 0   No current facility-administered medications for this visit.    SURGICAL HISTORY:  Past Surgical History:  Procedure Laterality Date  . BRONCHIAL BIOPSY  09/27/2019   Procedure: BRONCHIAL BIOPSIES;  Surgeon: Garner Nash, DO;  Location: [facility];  Service: Pulmonary;;  . BRONCHIAL BRUSHINGS  09/27/2019   Procedure: BRONCHIAL BRUSHINGS;  Surgeon: Garner Nash, DO;  Location: [facility];  Service: Pulmonary;;  . BRONCHIAL NEEDLE ASPIRATION BIOPSY  09/27/2019   Procedure: BRONCHIAL NEEDLE ASPIRATION BIOPSIES;  Surgeon: Garner Nash, DO;  Location: [facility];  Service: Pulmonary;;  . BRONCHIAL WASHINGS  09/27/2019   Procedure: BRONCHIAL WASHINGS;  Surgeon: Garner Nash, DO;  Location: [facility];  Service: Pulmonary;;  . COLONOSCOPY    .  CRYOTHERAPY  09/27/2019   Procedure: CRYOTHERAPY;  Surgeon: Garner Nash, DO;  Location: [facility];  Service: Pulmonary;;  . FIDUCIAL MARKER PLACEMENT  09/27/2019   Procedure: FIDUCIAL MARKER PLACEMENT;  Surgeon: Garner Nash, DO;  Location: [facility];  Service: Pulmonary;;  . HEMOSTASIS CONTROL  09/27/2019   Procedure: HEMOSTASIS CONTROL;  Surgeon: Garner Nash, DO;  Location: [facility];  Service: Pulmonary;;  . HERNIA REPAIR    . RIGHT/LEFT HEART CATH AND CORONARY ANGIOGRAPHY N/A  02/09/2020   Procedure: RIGHT/LEFT HEART CATH AND CORONARY ANGIOGRAPHY;  Surgeon: Burnell Blanks, MD;  Location: [facility];  Service: Cardiovascular;  Laterality: N/A;  . VIDEO BRONCHOSCOPY WITH ENDOBRONCHIAL NAVIGATION N/A 09/27/2019   Procedure: VIDEO BRONCHOSCOPY WITH ENDOBRONCHIAL NAVIGATION;  Surgeon: Garner Nash, DO;  Location: [place];  Service: Pulmonary;  Laterality: N/A;    REVIEW OF SYSTEMS:  A comprehensive review of systems was negative except for: Respiratory: positive for cough   PHYSICAL EXAMINATION: General appearance: alert, cooperative and no distress Head: Normocephalic, without obvious abnormality, atraumatic Neck: no adenopathy, no JVD, supple, symmetrical, trachea midline and thyroid not enlarged, symmetric, no tenderness/mass/nodules Lymph nodes: Cervical, supraclavicular, and axillary nodes normal. Resp: clear to auscultation bilaterally Back: symmetric, no curvature. ROM normal. No CVA tenderness. Cardio: regular rate and rhythm, S1, S2 normal, no murmur, click, rub or gallop GI: soft, non-tender; bowel sounds normal; no masses,  no organomegaly Extremities: extremities normal, atraumatic, no cyanosis or edema  ECOG PERFORMANCE STATUS: 1 - Symptomatic but completely ambulatory  Blood pressure (!) 140/57, pulse (!) 55, temperature 97.8 F (36.6 C), temperature source Tympanic, resp. rate 20, height 6' (1.829 m), weight 184 lb 1.6 oz (83.5 kg), SpO2 93 %.  LABORATORY DATA: Lab Results  Component Value Date   WBC 7.0 02/07/2020   HGB 16.0 02/09/2020   HCT 47.0 02/09/2020   MCV 94 02/07/2020   PLT 185 02/07/2020      Chemistry      Component Value Date/Time   NA 142 02/09/2020 0942   NA 139 02/07/2020 1208   K 4.5 02/09/2020 0942   CL 98 02/07/2020 1208   CO2 27 02/07/2020 1208   BUN 17 02/07/2020 1208   CREATININE 0.89 02/07/2020 1208   CREATININE 1.03 10/14/2019 0855      Component Value Date/Time   CALCIUM 9.4  02/07/2020 1208   ALKPHOS 86 02/07/2020 1208   AST 10 02/07/2020 1208   AST 13 (L) 10/14/2019 0855   ALT 8 02/07/2020 1208   ALT 11 10/14/2019 0855   BILITOT 0.4 02/07/2020 1208   BILITOT 0.6 10/14/2019 0855       RADIOGRAPHIC STUDIES: CT Chest W Contrast  Result Date: 02/29/2020 CLINICAL DATA:  Non-small-cell lung cancer.  Restaging. EXAM: CT CHEST WITH CONTRAST TECHNIQUE: Multidetector CT imaging of the chest was performed during intravenous contrast administration. CONTRAST:  92mL OMNIPAQUE IOHEXOL 300 MG/ML  SOLN COMPARISON:  09/22/2019 FINDINGS: Cardiovascular: Cardiopericardial silhouette is at upper limits of normal for size. Coronary artery calcification is evident. Atherosclerotic calcification is noted in the wall of the thoracic aorta. Prominence of the pulmonary outflow tract and main pulmonary arteries raises concern for pulmonary arterial hypertension. Mediastinum/Nodes: Low right paratracheal node measured previously at 12 mm short axis is 11 mm short axis today. No new mediastinal lymphadenopathy. No evidence for gross hilar lymphadenopathy although assessment is limited by the lack of intravenous contrast on today's study. The esophagus has normal imaging features. Stable heterogeneous 4  cm right thyroid nodule. There is no axillary lymphadenopathy. Lungs/Pleura: Centrilobular emphsyema noted. Right upper lobe spiculated pulmonary nodule measured previously at 1.5 x 1.2 cm now measures 1.1 x 0.8 cm. Volume loss right hemithorax is similar to prior. Focal areas of collapse/consolidative opacity in the anterior and posterior right lung base are stable, likely scarring. Stable chronic small right pleural fluid collection. No new suspicious pulmonary nodule or mass. Upper Abdomen: Stable thickening right adrenal gland. Gallbladder not visualized on today's study although stones were seen previously. Musculoskeletal: No worrisome lytic or sclerotic osseous abnormality. IMPRESSION: 1.  Interval decrease in the spiculated right upper lobe pulmonary nodule. 2. Stable volume loss right hemithorax with areas of rounded atelectasis in the anterior and posterior right lung base. 3. Stable chronic small right pleural fluid collection. 4. Prominence of the pulmonary outflow tract and main pulmonary arteries raises concern for pulmonary arterial hypertension. 5. Stable 4 cm heterogeneous right thyroid nodule. Recommend thyroid US (ref: J Am Coll Radiol. 2015 Feb;12(2): 143-50). 6. Cholelithiasis. 7. Aortic Atherosclerosis (ICD10-I70.0) and Emphysema (ICD10-J43.9). Electronically Signed   By: Misty Stanley M.D.   On: 02/29/2020 09:21   CARDIAC CATHETERIZATION  Result Date: 02/09/2020  Mid Cx to Dist Cx lesion is 100% stenosed.  2nd Mrg lesion is 99% stenosed.  Ost LM to Dist LM lesion is 30% stenosed.  Prox LAD to Mid LAD lesion is 30% stenosed.  Ost Cx to Mid Cx lesion is 30% stenosed.  Prox RCA lesion is 99% stenosed.  1. The LAD is a large caliber vessel that courses to the apex. Diffuse mild calcified disease without any focally obstructive lesions. 2. The Circumflex is a large dominant vessel. There is heavy calcification in the proximal and mid vessel. The mid Circumflex is chronically occluded just beyond a small caliber obtuse marginal branch. The left sided PDA fills from left to left collaterals. 3. The small non-dominant RCA has severe diffuse proximal and mid vessel disease. This non-dominant vessel is too small for PCI. 4. Normal right and left heart pressures Recommendations: Medical management of CAD. His mid Circumflex is chronically occluded and fills briskly from left to left collaterals. The RCA is a small non-dominant vessel with severe diffuse proximal and mid disease. Could consider viability study to assess the lateral wall and if viable myocardium, review his films with the CTO team to consider PCI of the Circumflex. He seems to be asymptomatic at this time so the best  approach may be medical management of his CAD without an attempt at PCI.    ASSESSMENT AND PLAN: This is a very pleasant 79 years old white male recently diagnosed with synchronous stage Ia non-small cell lung cancer, squamous cell carcinoma involving the right upper lobe pulmonary nodule in addition to right lower lobe endobronchial lesion diagnosed in June 2021 status post SBRT to these lesions under the care of Dr. Dorathy Daft. The patient is feeling fine today with no concerning complaints. He had repeat CT scan of the chest performed recently.  I personally and independently reviewed the scans and discussed the results with the patient and his family. His scan showed improvement of the pulmonary nodules. I recommended for him to continue on observation with repeat CT scan of the chest in 6 months. The patient was advised to call immediately if he has any concerning symptoms in the interval. The patient voices understanding of current disease status and treatment options and is in agreement with the current care plan.  All questions were answered.  The patient knows to call the clinic with any problems, questions or concerns. We can certainly see the patient much sooner if necessary.   Disclaimer: This note was dictated with voice recognition software. Similar sounding words can inadvertently be transcribed and may not be corrected upon review.

## 2020-06-13 ENCOUNTER — Ambulatory Visit: Payer: Medicare Other | Admitting: Cardiology

## 2020-07-27 ENCOUNTER — Ambulatory Visit: Payer: Medicare Other | Admitting: Cardiology

## 2020-08-13 ENCOUNTER — Ambulatory Visit: Payer: Medicare Other | Admitting: Cardiology

## 2020-08-28 ENCOUNTER — Other Ambulatory Visit: Payer: Self-pay

## 2020-08-28 ENCOUNTER — Inpatient Hospital Stay: Payer: Medicare Other | Attending: Internal Medicine

## 2020-08-28 ENCOUNTER — Ambulatory Visit (HOSPITAL_COMMUNITY)
Admission: RE | Admit: 2020-08-28 | Discharge: 2020-08-28 | Disposition: A | Discharge status: Home / Self Care | Payer: Medicare Other | Source: Ambulatory Visit | Attending: Internal Medicine | Admitting: Internal Medicine

## 2020-08-28 DIAGNOSIS — C3490 Malignant neoplasm of unspecified part of unspecified bronchus or lung: Secondary | ICD-10-CM

## 2020-08-28 DIAGNOSIS — C3411 Malignant neoplasm of upper lobe, right bronchus or lung: Secondary | ICD-10-CM | POA: Insufficient documentation

## 2020-08-28 LAB — CBC WITH DIFFERENTIAL (CANCER CENTER ONLY)
Abs Immature Granulocytes: 0.02 10*3/uL (ref 0.00–0.07)
Basophils Absolute: 0.1 10*3/uL (ref 0.0–0.1)
Basophils Relative: 1 %
Eosinophils Absolute: 0.2 10*3/uL (ref 0.0–0.5)
Eosinophils Relative: 3 %
HCT: 47.2 % (ref 39.0–52.0)
Hemoglobin: 15.4 g/dL (ref 13.0–17.0)
Immature Granulocytes: 0 %
Lymphocytes Relative: 18 %
Lymphs Abs: 1.3 10*3/uL (ref 0.7–4.0)
MCH: 31.3 pg (ref 26.0–34.0)
MCHC: 32.6 g/dL (ref 30.0–36.0)
MCV: 95.9 fL (ref 80.0–100.0)
Monocytes Absolute: 0.7 10*3/uL (ref 0.1–1.0)
Monocytes Relative: 9 %
Neutro Abs: 5.1 10*3/uL (ref 1.7–7.7)
Neutrophils Relative %: 69 %
Platelet Count: 174 10*3/uL (ref 150–400)
RBC: 4.92 MIL/uL (ref 4.22–5.81)
RDW: 13.5 % (ref 11.5–15.5)
WBC Count: 7.4 10*3/uL (ref 4.0–10.5)
nRBC: 0 % (ref 0.0–0.2)

## 2020-08-28 LAB — CMP (CANCER CENTER ONLY)
ALT: 13 U/L (ref 0–44)
AST: 13 U/L — ABNORMAL LOW (ref 15–41)
Albumin: 3.7 g/dL (ref 3.5–5.0)
Alkaline Phosphatase: 67 U/L (ref 38–126)
Anion gap: 11 (ref 5–15)
BUN: 18 mg/dL (ref 8–23)
CO2: 28 mmol/L (ref 22–32)
Calcium: 9.4 mg/dL (ref 8.9–10.3)
Chloride: 102 mmol/L (ref 98–111)
Creatinine: 1.01 mg/dL (ref 0.61–1.24)
GFR, Estimated: >60 mL/min (ref >60)
Glucose, Bld: 88 mg/dL (ref 70–99)
Potassium: 4.6 mmol/L (ref 3.5–5.1)
Sodium: 141 mmol/L (ref 135–145)
Total Bilirubin: 0.5 mg/dL (ref 0.3–1.2)
Total Protein: 7 g/dL (ref 6.5–8.1)

## 2020-08-29 ENCOUNTER — Inpatient Hospital Stay: Payer: Medicare Other | Attending: Internal Medicine | Admitting: Internal Medicine

## 2020-08-29 DIAGNOSIS — Z923 Personal history of irradiation: Secondary | ICD-10-CM | POA: Diagnosis not present

## 2020-08-29 DIAGNOSIS — C3490 Malignant neoplasm of unspecified part of unspecified bronchus or lung: Secondary | ICD-10-CM | POA: Diagnosis not present

## 2020-08-29 DIAGNOSIS — Z85118 Personal history of other malignant neoplasm of bronchus and lung: Secondary | ICD-10-CM | POA: Insufficient documentation

## 2020-08-29 DIAGNOSIS — R001 Bradycardia, unspecified: Secondary | ICD-10-CM | POA: Diagnosis not present

## 2020-08-29 NOTE — Progress Notes (Signed)
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  PRIOR THERAPY: SBRT to the right upper lobe pulmonary nodule as well as the right lower lobe pulmonary nodule under the care of Dr. Sondra Come completed on 11/14/2019.  CURRENT THERAPY: Observation.  INTERVAL HISTORY: Gerald [name] 80 y.o. male returns to the clinic today for follow-up visit accompanied by his wife and daughter.  The patient is feeling fine today with no concerning complaints except for mild shortness of breath with exertion.  He also was found to have bradycardia earlier today but much better on repeat monitoring.  He had a previous ablation under the care of Dr. Angelena Form.  He denied having any chest pain, shortness of breath except with exertion with no cough or hemoptysis.  He has no nausea, vomiting, diarrhea or constipation.  He has no headache or visual changes.  The patient is here today for evaluation with repeat CT scan of the chest for restaging of his disease.  MEDICAL HISTORY: Past Medical History:  Diagnosis Date  . Bursitis   . Carotid artery disease ([place])   . COPD (chronic obstructive pulmonary disease) ([place])   . Hypercholesterolemia   . Lung nodule   . nscl ca dx'd 08/2019  . Psoriasis   . Tobacco abuse   . Wears glasses    reading    ALLERGIES:  has No Known Allergies.  MEDICATIONS:  Current Outpatient Medications  Medication Sig Dispense Refill  . acitretin (SORIATANE) 25 MG capsule Take 25 mg by mouth daily.    Marland Kitchen albuterol (VENTOLIN HFA) 108 (90 Base) MCG/ACT inhaler Inhale 2 puffs into the lungs every 4 (four) hours as needed for wheezing.  (Patient not taking: Reported on 02/27/2020)     . aspirin EC 81 MG tablet Take 1 tablet (81 mg total) by mouth daily. Swallow whole. 90 tablet 3  . clobetasol cream (TEMOVATE) 1.93 % Apply 1 application topically daily as needed (Psoriasis).    . Multiple Vitamins-Minerals (ICAPS) TABS Take 1 tablet by mouth in the morning and at bedtime. IREDS     . omega-3 acid ethyl esters (LOVAZA) 1 g capsule     . Omega-3 Fatty Acids (FISH OIL) 1000 MG CAPS Take 1,000 mg by mouth daily.     . rosuvastatin (CRESTOR) 20 MG tablet Take 1 tablet (20 mg total) by mouth daily. 30 tablet 11  . Tiotropium Bromide-Olodaterol (STIOLTO RESPIMAT) 2.5-2.5 MCG/ACT AERS Inhale 2 puffs into the lungs daily. (Patient not taking: Reported on 02/27/2020) 4 g 0   No current facility-administered medications for this visit.    SURGICAL HISTORY:  Past Surgical History:  Procedure Laterality Date  . BRONCHIAL BIOPSY  09/27/2019   Procedure: BRONCHIAL BIOPSIES;  Surgeon: Garner Nash, DO;  Location: [facility];  Service: Pulmonary;;  . BRONCHIAL BRUSHINGS  09/27/2019   Procedure: BRONCHIAL BRUSHINGS;  Surgeon: Garner Nash, DO;  Location: [facility];  Service: Pulmonary;;  . BRONCHIAL NEEDLE ASPIRATION BIOPSY  09/27/2019   Procedure: BRONCHIAL NEEDLE ASPIRATION BIOPSIES;  Surgeon: Garner Nash, DO;  Location: [facility];  Service: Pulmonary;;  . BRONCHIAL WASHINGS  09/27/2019   Procedure: BRONCHIAL WASHINGS;  Surgeon: Garner Nash, DO;  Location:  [facility];  Service: Pulmonary;;  . COLONOSCOPY    . CRYOTHERAPY  09/27/2019   Procedure: CRYOTHERAPY;  Surgeon: Garner Nash, DO;  Location: [facility];  Service: Pulmonary;;  . FIDUCIAL MARKER PLACEMENT  09/27/2019   Procedure: FIDUCIAL MARKER PLACEMENT;  Surgeon: Garner Nash, DO;  Location: [facility];  Service: Pulmonary;;  . HEMOSTASIS CONTROL  09/27/2019   Procedure: HEMOSTASIS CONTROL;  Surgeon: Garner Nash, DO;  Location: [facility];  Service: Pulmonary;;  . HERNIA REPAIR    .  RIGHT/LEFT HEART CATH AND CORONARY ANGIOGRAPHY N/A 02/09/2020   Procedure: RIGHT/LEFT HEART CATH AND CORONARY ANGIOGRAPHY;  Surgeon: Burnell Blanks, MD;  Location: [place];  Service: Cardiovascular;  Laterality: N/A;  . VIDEO BRONCHOSCOPY WITH ENDOBRONCHIAL NAVIGATION N/A 09/27/2019   Procedure: VIDEO BRONCHOSCOPY WITH ENDOBRONCHIAL NAVIGATION;  Surgeon: Garner Nash, DO;  Location: [facility];  Service: Pulmonary;  Laterality: N/A;    REVIEW OF SYSTEMS:  A comprehensive review of systems was negative except for: Respiratory: positive for dyspnea on exertion Cardiovascular: positive for Bradycardia   PHYSICAL EXAMINATION: General appearance: alert, cooperative and no distress Head: Normocephalic, without obvious abnormality, atraumatic Neck: no adenopathy, no JVD, supple, symmetrical, trachea midline and thyroid not enlarged, symmetric, no tenderness/mass/nodules Lymph nodes: Cervical, supraclavicular, and axillary nodes normal. Resp: clear to auscultation bilaterally Back: symmetric, no curvature. ROM normal. No CVA tenderness. Cardio: regular rate and rhythm, S1, S2 normal, no murmur, click, rub or gallop GI: soft, non-tender; bowel sounds normal; no masses,  no organomegaly Extremities: extremities normal, atraumatic, no cyanosis or edema  ECOG PERFORMANCE STATUS: 1 - Symptomatic but completely ambulatory  Blood pressure 114/61, pulse (!) 56, temperature (!) 97.2 F (36.2 C), resp. rate 18, height 6' (1.829 m), weight 189 lb 1.6 oz (85.8 kg), SpO2 98 %.  LABORATORY DATA: Lab Results  Component Value Date   WBC 7.4 08/28/2020   HGB 15.4 08/28/2020   HCT 47.2 08/28/2020   MCV 95.9 08/28/2020   PLT 174 08/28/2020      Chemistry      Component Value Date/Time   NA 141 08/28/2020 1251   NA 139 02/07/2020 1208   K 4.6 08/28/2020 1251   CL 102 08/28/2020 1251   CO2 28 08/28/2020 1251   BUN 18 08/28/2020 1251   BUN 17 02/07/2020 1208   CREATININE 1.01  08/28/2020 1251      Component Value Date/Time   CALCIUM 9.4 08/28/2020 1251   ALKPHOS 67 08/28/2020 1251   AST 13 (L) 08/28/2020 1251   ALT 13 08/28/2020 1251   BILITOT 0.5 08/28/2020 1251       RADIOGRAPHIC STUDIES: CT CHEST WO CONTRAST  Result Date: 08/28/2020 CLINICAL DATA:  Restaging non-small cell lung cancer. Radiation therapy completed August 2021. EXAM: CT CHEST WITHOUT CONTRAST TECHNIQUE: Multidetector CT imaging of the chest was performed following the standard protocol without IV contrast. COMPARISON:  02/28/2020 FINDINGS: Cardiovascular: Coronary, aortic arch, and branch vessel atherosclerotic vascular disease. Calcification of the posterior leaflet mitral valve. Mediastinum/Nodes: Stable heterogeneous 3.8 cm right thyroid nodule. Right lower paratracheal node 1.2 cm in short axis on image 70 series 2, previously the same by my measurements. Lungs/Pleura: Chronic volume loss in the right hemithorax associated with pleural thickening and a small right pleural effusion. Emphysema. Scattered reticular subpleural scarring in the right lung especially anteriorly with some associated volume loss along the right middle lobe and right lower lobe similar to previous, with some associated airway plugging. Fiducials  in the right upper lobe. The site of previous nodularity measures about 1.1 by 0.6 by 0.5 cm on image 76 series 5, roughly similar to prior. No new or enlarging nodule. Upper Abdomen: Unremarkable Musculoskeletal: Thoracic spondylosis. IMPRESSION: 1. Essentially stable with a small right pleural effusion with pleural thickening, volume loss in the right middle lobe and right lower lobe, and therapy related findings in the right upper lobe with unchanged small right upper lobe pulmonary nodule. 2. Stable heterogeneous 3.8 cm right thyroid nodule. Recommend thyroid US (ref: J Am Coll Radiol. 2015 Feb;12(2): 143-50). 3. Stable mildly prominent right lower paratracheal lymph node at 1.2 cm  in diameter. 4. Aortic Atherosclerosis (ICD10-I70.0) and Emphysema (ICD10-J43.9). Coronary atherosclerosis. Mitral valve calcification. Electronically Signed   By: Van Clines M.D.   On: 08/28/2020 15:52    ASSESSMENT AND PLAN: This is a very pleasant 80 years old white male recently diagnosed with synchronous stage IA non-small cell lung cancer, squamous cell carcinoma involving the right upper lobe pulmonary nodule in addition to right lower lobe endobronchial lesion diagnosed in June 2021 status post SBRT to these lesions under the care of Dr. Sondra Come. He had repeat CT scan of the chest performed recently.  I personally and independently reviewed the scans and discussed the results with the patient and his family. His scan showed no concerning findings for disease recurrence or progression. I recommended for him to continue on observation with repeat CT scan of the chest in 6 months. Will continue to monitor the thyroid nodule closely on the upcoming imaging studies. The patient was advised to reach out to his cardiologist regarding the bradycardia.  He has an appointment with Dr. Angelena Form in 2 days. He was advised to call immediately if he has any concerning symptoms in the interval. The patient is currently on observation and he is feeling fine today with no concerning complaints except for shortness of breath with exertion. The patient voices understanding of current disease status and treatment options and is in agreement with the current care plan.  All questions were answered. The patient knows to call the clinic with any problems, questions or concerns. We can certainly see the patient much sooner if necessary.   Disclaimer: This note was dictated with voice recognition software. Similar sounding words can inadvertently be transcribed and may not be corrected upon review.

## 2020-08-31 ENCOUNTER — Ambulatory Visit (INDEPENDENT_AMBULATORY_CARE_PROVIDER_SITE_OTHER): Payer: Medicare Other | Admitting: Cardiology

## 2020-08-31 ENCOUNTER — Other Ambulatory Visit: Payer: Self-pay

## 2020-08-31 VITALS — BP 120/58 | HR 50 | Ht 72.0 in | Wt 187.2 lb

## 2020-08-31 DIAGNOSIS — R001 Bradycardia, unspecified: Secondary | ICD-10-CM

## 2020-08-31 DIAGNOSIS — Z720 Tobacco use: Secondary | ICD-10-CM

## 2020-08-31 DIAGNOSIS — E7800 Pure hypercholesterolemia, unspecified: Secondary | ICD-10-CM

## 2020-08-31 DIAGNOSIS — I255 Ischemic cardiomyopathy: Secondary | ICD-10-CM | POA: Diagnosis not present

## 2020-08-31 DIAGNOSIS — I2584 Coronary atherosclerosis due to calcified coronary lesion: Secondary | ICD-10-CM

## 2020-08-31 DIAGNOSIS — I2510 Atherosclerotic heart disease of native coronary artery without angina pectoris: Secondary | ICD-10-CM | POA: Diagnosis not present

## 2020-08-31 NOTE — Patient Instructions (Signed)
Medication Instructions:  Your Physician recommend you continue on your current medication as directed.    *If you need a refill on your cardiac medications before your next appointment, please call your pharmacy*   Lab Work: None ordered today   Testing/Procedures: None ordered today   Follow-Up: At [facility], you and your health needs are our priority.  As part of our continuing mission to provide you with exceptional heart care, we have created designated Provider Care Teams.  These Care Teams include your primary Cardiologist (physician) and Advanced Practice Providers (APPs -  Physician Assistants and Nurse Practitioners) who all work together to provide you with the care you need, when you need it.  We recommend signing up for the patient portal called "MyChart".  Sign up information is provided on this After Visit Summary.  MyChart is used to connect with patients for Virtual Visits (Telemedicine).  Patients are able to view lab/test results, encounter notes, upcoming appointments, etc.  Non-urgent messages can be sent to your provider as well.   To learn more about what you can do with MyChart, go to [email or web address].    Your next appointment:   3 month(s) @ [address] [place], [place] [address]   The format for your next appointment:   In Person  Provider:   Buford Dresser, MD

## 2020-08-31 NOTE — Progress Notes (Signed)
Cardiology Office Note:    Date:  08/31/2020   ID:  Gerald [name], DOB 1940-07-03, MRN [id number]  PCP:  Olam Idler, MD  Cardiologist:  Buford Dresser, MD  Referring MD: Olam Idler, MD   CC: follow up  History of Present Illness:    Gerald [name] is a 80 y.o. male with a hx of right upper lobe lung carcinoma who is seen for follow up today. I initially met him 01/26/20 as a new consult at the request of Olam Idler, MD for the evaluation and management of bradycardia.  From [facility]:  Echo dated 01/11/20 has an incomplete report in the scanned records. Per Care Everywhere, summary was a technically difficult study, no effusion, globally decreased EF (reported at 40%), no significant valve disease.  Note from 11/29/19 from Dr. Carlis Abbott notes ECG with second degree AV block, type 1. Pulse charted as 50 bpm. Planned for ambulatory monitor and echo.   [employer] monitor result report scanned. Notes 6 days of use. Per report, most severe heart block was first degree AV block, slowest rate during this was 63 bpm. Did have PAC burden of 37%, PVC burden <1%, one episode of NSVT at 126 bpm (4 beats).   Cardiac management: bradycardia limits use of beta blocker and hypotension/lightheadedness limits ACEi/ARB/ARNI/MRA. He is taking aspirin 81 mg daily without issue. Tolerating rosuvastatin.  Today: Doing well overall. Remains asymptomatic from his bradycardia. Sleeps about 4 hours at night, then sits in the chair due to his back and sleeps more. Stays active during the day.  CT scan stable, saw Dr. Julien Nordmann earlier this week.  Had a gout flare yesterday, back on allopurinol. Pending PCP appt in two weeks.   Denies chest pain, shortness of breath at rest or with normal exertion. No PND, orthopnea, LE edema or unexpected weight gain. No syncope or palpitations.  Still smoking, precontemplative.   Past Medical History:  Diagnosis Date  . Bursitis   . Carotid artery disease  ([place])   . COPD (chronic obstructive pulmonary disease) ([place])   . Hypercholesterolemia   . Lung nodule   . nscl ca dx'd 08/2019  . Psoriasis   . Tobacco abuse   . Wears glasses    reading    Past Surgical History:  Procedure Laterality Date  . BRONCHIAL BIOPSY  09/27/2019   Procedure: BRONCHIAL BIOPSIES;  Surgeon: Garner Nash, DO;  Location: [place] [facility];  Service: Pulmonary;;  . BRONCHIAL BRUSHINGS  09/27/2019   Procedure: BRONCHIAL BRUSHINGS;  Surgeon: Garner Nash, DO;  Location: [place] [facility];  Service: Pulmonary;;  . BRONCHIAL NEEDLE ASPIRATION BIOPSY  09/27/2019   Procedure: BRONCHIAL NEEDLE ASPIRATION BIOPSIES;  Surgeon: Garner Nash, DO;  Location: [place] [facility];  Service: Pulmonary;;  . BRONCHIAL WASHINGS  09/27/2019   Procedure: BRONCHIAL WASHINGS;  Surgeon: Garner Nash, DO;  Location: [place] [facility];  Service: Pulmonary;;  . COLONOSCOPY    . CRYOTHERAPY  09/27/2019   Procedure: CRYOTHERAPY;  Surgeon: Garner Nash, DO;  Location: [place] [facility];  Service: Pulmonary;;  . FIDUCIAL MARKER PLACEMENT  09/27/2019   Procedure: FIDUCIAL MARKER PLACEMENT;  Surgeon: Garner Nash, DO;  Location: [place] [facility];  Service: Pulmonary;;  . HEMOSTASIS CONTROL  09/27/2019   Procedure: HEMOSTASIS CONTROL;  Surgeon: Garner Nash, DO;  Location: [place] [facility];  Service: Pulmonary;;  . HERNIA REPAIR    . RIGHT/LEFT HEART CATH AND CORONARY ANGIOGRAPHY N/A 02/09/2020   Procedure: RIGHT/LEFT HEART CATH AND CORONARY ANGIOGRAPHY;  Surgeon:  Burnell Blanks, MD;  Location: [place] [facility];  Service: Cardiovascular;  Laterality: N/A;  . VIDEO BRONCHOSCOPY WITH ENDOBRONCHIAL NAVIGATION N/A 09/27/2019   Procedure: VIDEO BRONCHOSCOPY WITH ENDOBRONCHIAL NAVIGATION;  Surgeon: Garner Nash, DO;  Location: [place];  Service: Pulmonary;  Laterality: N/A;    Current Medications: Current Outpatient Medications on File Prior to Visit  Medication Sig  . acitretin (SORIATANE) 25  MG capsule Take 25 mg by mouth daily.  Marland Kitchen albuterol (VENTOLIN HFA) 108 (90 Base) MCG/ACT inhaler Inhale 2 puffs into the lungs every 4 (four) hours as needed for wheezing.  (Patient not taking: Reported on 02/27/2020)  . aspirin EC 81 MG tablet Take 1 tablet (81 mg total) by mouth daily. Swallow whole.  . clobetasol cream (TEMOVATE) 0.96 % Apply 1 application topically daily as needed (Psoriasis).  . Multiple Vitamins-Minerals (ICAPS) TABS Take 1 tablet by mouth in the morning and at bedtime. IREDS   . omega-3 acid ethyl esters (LOVAZA) 1 g capsule   . Omega-3 Fatty Acids (FISH OIL) 1000 MG CAPS Take 1,000 mg by mouth daily.   . rosuvastatin (CRESTOR) 20 MG tablet Take 1 tablet (20 mg total) by mouth daily.  . Tiotropium Bromide-Olodaterol (STIOLTO RESPIMAT) 2.5-2.5 MCG/ACT AERS Inhale 2 puffs into the lungs daily. (Patient not taking: Reported on 02/27/2020)   No current facility-administered medications on file prior to visit.     Allergies:   Patient has no known allergies.   Social History   Tobacco Use  . Smoking status: Current Every Day Smoker    Packs/day: 2.00    Types: Cigarettes  . Smokeless tobacco: Never Used  Vaping Use  . Vaping Use: Never used  Substance Use Topics  . Alcohol use: Yes    Comment: social  . Drug use: Never    Family History: family history includes Lung cancer in his brother; Mesothelioma (age of onset: 56) in his sister.  ROS:   Please see the history of present illness.  Additional pertinent ROS otherwise unremarkable.     EKGs/Labs/Other Studies Reviewed:    The following studies were reviewed today: Outside testing summarized in HPI Cardiac cath 02/09/20  Mid Cx to Dist Cx lesion is 100% stenosed.  2nd Mrg lesion is 99% stenosed.  Ost LM to Dist LM lesion is 30% stenosed.  Prox LAD to Mid LAD lesion is 30% stenosed.  Ost Cx to Mid Cx lesion is 30% stenosed.  Prox RCA lesion is 99% stenosed.   1. The LAD is a large caliber  vessel that courses to the apex. Diffuse mild calcified disease without any focally obstructive lesions.  2. The Circumflex is a large dominant vessel. There is heavy calcification in the proximal and mid vessel. The mid Circumflex is chronically occluded just beyond a small caliber obtuse marginal branch. The left sided PDA fills from left to left collaterals.  3. The small non-dominant RCA has severe diffuse proximal and mid vessel disease. This non-dominant vessel is too small for PCI.  4. Normal right and left heart pressures  Recommendations: Medical management of CAD. His mid Circumflex is chronically occluded and fills briskly from left to left collaterals. The RCA is a small non-dominant vessel with severe diffuse proximal and mid disease. Could consider viability study to assess the lateral wall and if viable myocardium, review his films with the CTO team to consider PCI of the Circumflex. He seems to be asymptomatic at this time so the best approach may be medical  management of his CAD without an attempt at PCI.   EKG:  EKG is personally reviewed.  The ekg ordered 01/26/20 demonstrates SR with SA at 85 bpm  Recent Labs: 08/28/2020: ALT 13; BUN 18; Creatinine 1.01; Hemoglobin 15.4; Platelet Count 174; Potassium 4.6; Sodium 141  Recent Lipid Panel No results found for: CHOL, TRIG, HDL, CHOLHDL, VLDL, LDLCALC, LDLDIRECT  Physical Exam:    VS:  BP (!) 120/58   Pulse (!) 50   Ht 6' (1.829 m)   Wt 187 lb 3.2 oz (84.9 kg)   SpO2 96%   BMI 25.39 kg/m     Wt Readings from Last 3 Encounters:  08/29/20 189 lb 1.6 oz (85.8 kg)  02/29/20 184 lb 1.6 oz (83.5 kg)  02/27/20 183 lb 12.8 oz (83.4 kg)    GEN: Well nourished, well developed in no acute distress HEENT: Normal, moist mucous membranes NECK: No JVD CARDIAC: regular rhythm, normal S1 and S2, no rubs or gallops. No murmur. VASCULAR: Radial and DP pulses 2+ bilaterally. No carotid bruits RESPIRATORY:  Clear to auscultation without  rales, wheezing or rhonchi  ABDOMEN: Soft, non-tender, non-distended MUSCULOSKELETAL:  Ambulates independently SKIN: Warm and dry, no edema NEUROLOGIC:  Alert and oriented x 3. No focal neuro deficits noted. PSYCHIATRIC:  Normal affect   ASSESSMENT:    1. Bradycardia   2. Coronary artery disease due to calcified coronary lesion   3. Ischemic cardiomyopathy   4. Pure hypercholesterolemia   5. Tobacco abuse    PLAN:    Coronary artery disease, with CTO and collaterals as above Ischemic cardiomyopathy Hypercholesterolemia, goal LDL <70 -tolerating aspirin, continue -tolerating rosuvastatin 20 mg daily.  -no beta blocker given bradycardia -no blood pressure room for ACEi/ARB/ARNI/MRA -asymptomatic -discussed sublingual nitroglycerin today. He states he would not use, and given his resting hypotension would be better for him to be monitored. Recommended he call 911 with severe chest discomfort or shortness of breath -counseled on signs of heart failure, what to watch for, daily weights, etc  Bradycardia: based on monitor results, no high degree block. Avoid AV nodal agents. Asymptomatic  Tobacco use counseling: counseled on importance of this. He is precontemplative  Cardiac risk counseling and prevention recommendations: -recommend heart healthy/Mediterranean diet, with whole grains, fruits, vegetable, fish, lean meats, nuts, and olive oil. Limit salt. -recommend moderate walking, 3-5 times/week for 30-50 minutes each session. Aim for at least 150 minutes.week. Goal should be pace of 3 miles/hours, or walking 1.5 miles in 30 minutes -recommend avoidance of tobacco products. Avoid excess alcohol. -ASCVD risk score: The ASCVD Risk score Mikey Bussing DC Jr., et al., 2013) failed to calculate for the following reasons:   The 2013 ASCVD risk score is only valid for ages 68 to 62    Plan for follow up: 3 mos or sooner as needed  Buford Dresser, MD, PhD [place]  [facility]    Medication Adjustments/Labs and Tests Ordered: Current medicines are reviewed at length with the patient today.  Concerns regarding medicines are outlined above.  No orders of the defined types were placed in this encounter.  No orders of the defined types were placed in this encounter.   Patient Instructions  Medication Instructions:  Your Physician recommend you continue on your current medication as directed.    *If you need a refill on your cardiac medications before your next appointment, please call your pharmacy*   Lab Work: None ordered today   Testing/Procedures: None ordered today   Follow-Up: At  [facility], you and your health needs are our priority.  As part of our continuing mission to provide you with exceptional heart care, we have created designated Provider Care Teams.  These Care Teams include your primary Cardiologist (physician) and Advanced Practice Providers (APPs -  Physician Assistants and Nurse Practitioners) who all work together to provide you with the care you need, when you need it.  We recommend signing up for the patient portal called "MyChart".  Sign up information is provided on this After Visit Summary.  MyChart is used to connect with patients for Virtual Visits (Telemedicine).  Patients are able to view lab/test results, encounter notes, upcoming appointments, etc.  Non-urgent messages can be sent to your provider as well.   To learn more about what you can do with MyChart, go to [email or web address].    Your next appointment:   3 month(s) @ [address] [place], [place] [address]   The format for your next appointment:   In Person  Provider:   Buford Dresser, MD       Signed, Buford Dresser, MD PhD 08/31/2020  [place]

## 2020-09-03 ENCOUNTER — Encounter: Payer: Self-pay | Admitting: Cardiology

## 2020-09-18 ENCOUNTER — Ambulatory Visit (INDEPENDENT_AMBULATORY_CARE_PROVIDER_SITE_OTHER): Payer: Medicare Other | Admitting: Family Medicine

## 2020-09-18 ENCOUNTER — Other Ambulatory Visit: Payer: Self-pay

## 2020-09-18 ENCOUNTER — Encounter: Payer: Self-pay | Admitting: Family Medicine

## 2020-09-18 VITALS — BP 107/55 | HR 45 | Temp 98.0°F | Ht 70.87 in | Wt 191.0 lb

## 2020-09-18 DIAGNOSIS — L409 Psoriasis, unspecified: Secondary | ICD-10-CM | POA: Diagnosis not present

## 2020-09-18 DIAGNOSIS — Z720 Tobacco use: Secondary | ICD-10-CM

## 2020-09-18 DIAGNOSIS — I255 Ischemic cardiomyopathy: Secondary | ICD-10-CM

## 2020-09-18 DIAGNOSIS — C3411 Malignant neoplasm of upper lobe, right bronchus or lung: Secondary | ICD-10-CM

## 2020-09-18 NOTE — Progress Notes (Signed)
Gerald [name] - 80 y.o. male MRN [id number]  Date of birth: 1941/02/27  Subjective Chief Complaint  Patient presents with   Establish Care    HPI Gerald [name] is a 80 y.o.  male here today for initial visit to establish care.   Recently moved to the area from [place].  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    Lung cancer treated with radiation therapy and currently under observation.  He continues to see Dr. Julien Nordmann and Dr. Valeta Harms.  He denies increased shortness of breath.   He does continue to smoke about 2ppd.  He has been able to quit but only for a brief period.  He has not had success with chantix or bupropion previously.    Psoriasis is managed by dermatology.  Current treatment with aciretin and clobetasol.  Followed by cardiology for ischemic cardiomyopathy and bradycardia.  No symptoms related to bradycardia.  BP is well controlled at this time without medication.    ROS:  A comprehensive ROS was completed and negative except as noted per HPI  No Known Allergies  Past Medical History:  Diagnosis Date   Bursitis    Carotid artery disease (H[facility])    COPD (chronic obstructive pulmonary disease) ([place])    Hypercholesterolemia    Lung nodule    nscl ca dx'd 08/2019   Psoriasis    Tobacco abuse    Wears glasses    reading    Past Surgical History:  Procedure Laterality Date   BRONCHIAL BIOPSY  09/27/2019   Procedure: BRONCHIAL BIOPSIES;  Surgeon: Garner Nash, DO;  Location: [place] [facility];  Service: Pulmonary;;   BRONCHIAL BRUSHINGS  09/27/2019   Procedure: BRONCHIAL BRUSHINGS;  Surgeon: Garner Nash, DO;  Location: [place];  Service: Pulmonary;;   BRONCHIAL NEEDLE ASPIRATION BIOPSY  09/27/2019   Procedure: BRONCHIAL NEEDLE ASPIRATION BIOPSIES;  Surgeon: Garner Nash, DO;  Location: [place];  Service: Pulmonary;;   BRONCHIAL WASHINGS  09/27/2019   Procedure: BRONCHIAL WASHINGS;  Surgeon: Garner Nash, DO;  Location: [place];  Service: Pulmonary;;   COLONOSCOPY     CRYOTHERAPY  09/27/2019   Procedure: [place] Ok;  Surgeon: Garner Nash, DO;  Location: [place] [facility];  Service: Pulmonary;;   FIDUCIAL MARKER PLACEMENT  09/27/2019   Procedure: FIDUCIAL MARKER PLACEMENT;  Surgeon: Garner Nash, DO;  Location: [place] [facility];  Service: Pulmonary;;   HEMOSTASIS CONTROL  09/27/2019   Procedure: HEMOSTASIS CONTROL;  Surgeon: Garner Nash, DO;  Location: [place] [facility];  Service: Pulmonary;;   HERNIA REPAIR     RIGHT/LEFT HEART CATH AND CORONARY ANGIOGRAPHY N/A 02/09/2020   Procedure: RIGHT/LEFT HEART CATH AND CORONARY ANGIOGRAPHY;  Surgeon: Burnell Blanks, MD;  Location: [place] [facility];  Service: Cardiovascular;  Laterality: N/A;   VIDEO BRONCHOSCOPY WITH ENDOBRONCHIAL NAVIGATION N/A 09/27/2019   Procedure: VIDEO BRONCHOSCOPY WITH ENDOBRONCHIAL NAVIGATION;  Surgeon: Garner Nash, DO;  Location: [place];  Service: Pulmonary;  Laterality: N/A;    Social History   Socioeconomic History   Marital status: Married    Spouse name: Not on file   Number of children: Not on file   Years of education: Not on file   Highest education level: Not on file  Occupational History   Not on file  Tobacco Use   Smoking status: Every Day    Packs/day: 2.00    Pack years: 0.00    Types: Cigarettes   Smokeless  tobacco: Never  Vaping Use   Vaping Use: Never used  Substance and Sexual Activity   Alcohol use: Yes    Comment: social   Drug use: Never   Sexual activity: Not Currently  Other Topics Concern   Not on file  Social History Narrative   Not on file   Social Determinants of Health   Financial Resource Strain: Not on file  Food Insecurity: Not on file  Transportation Needs: Not on file  Physical Activity: Not on file  Stress: Not on file  Social Connections: Not on file    Family History  Problem Relation Age of Onset   Heart attack Father     Mesothelioma Sister 14   Lung cancer Brother     Health Maintenance  Topic Date Due   TETANUS/TDAP  Never done   Zoster Vaccines- Shingrix (1 of 2) Never done   PNA vac Low Risk Adult (2 of 2 - PPSV23) 03/06/2016   COVID-19 Vaccine (3 - Moderna risk series) 06/22/2019   INFLUENZA VACCINE  10/29/2020   HPV VACCINES  Aged Out     ----------------------------------------------------------------------------------------------------------------------------------------------------------------------------------------------------------------- Physical Exam BP (!) 107/55 (BP Location: Left Arm, Patient Position: Sitting, Cuff Size: Large)   Pulse (!) 45   Temp 98 F (36.7 C) (Oral)   Ht 5' 10.87" (1.8 m)   Wt 191 lb (86.6 kg)   SpO2 92%   BMI 26.74 kg/m   Physical Exam Constitutional:      Appearance: Normal appearance.  HENT:     Head: Normocephalic and atraumatic.  Eyes:     General: No scleral icterus. Cardiovascular:     Rate and Rhythm: Normal rate and regular rhythm.  Pulmonary:     Effort: Pulmonary effort is normal.     Breath sounds: Normal breath sounds.  Musculoskeletal:     Cervical back: Neck supple.  Skin:    General: Skin is warm and dry.  Neurological:     General: No focal deficit present.     Mental Status: He is alert.  Psychiatric:        Mood and Affect: Mood normal.        Behavior: Behavior normal.    ------------------------------------------------------------------------------------------------------------------------------------------------------------------------------------------------------------------- Assessment and Plan  Ischemic cardiomyopathy Managed by cardiology.  Has asymptomatic bradycardia, continued monitoring at this time.    Primary cancer of right upper lobe of lung [facility]) S/p radiation therapy.  Continues to see oncology and pulmonology.  He is still smoking.  Counseled however he is not interested in quitting at this time.     Psoriasis Management per dermatology.  Stable symptoms at this time.     No orders of the defined types were placed in this encounter.   Return in about 6 months (around 03/20/2021) for med follow up.    This visit occurred during the SARS-CoV-2 public health emergency.  Safety protocols were in place, including screening questions prior to the visit, additional usage of staff PPE, and extensive cleaning of exam room while observing appropriate contact time as indicated for disinfecting solutions.

## 2020-09-18 NOTE — Assessment & Plan Note (Signed)
S/p radiation therapy.  Continues to see oncology and pulmonology.  He is still smoking.  Counseled however he is not interested in quitting at this time.

## 2020-09-18 NOTE — Patient Instructions (Signed)
Great to meet you today! We'll plan to follow up in 6 months

## 2020-09-18 NOTE — Assessment & Plan Note (Signed)
Management per dermatology.  Stable symptoms at this time.

## 2020-09-18 NOTE — Assessment & Plan Note (Signed)
Managed by cardiology.  Has asymptomatic bradycardia, continued monitoring at this time.

## 2020-10-08 ENCOUNTER — Encounter: Payer: Self-pay | Admitting: Family Medicine

## 2020-10-08 ENCOUNTER — Ambulatory Visit (INDEPENDENT_AMBULATORY_CARE_PROVIDER_SITE_OTHER): Payer: Medicare Other | Admitting: Family Medicine

## 2020-10-08 ENCOUNTER — Other Ambulatory Visit: Payer: Self-pay

## 2020-10-08 DIAGNOSIS — M7660 Achilles tendinitis, unspecified leg: Secondary | ICD-10-CM | POA: Insufficient documentation

## 2020-10-08 NOTE — Assessment & Plan Note (Signed)
This is more of an enthesitis rather than a true tendinitis of the Achilles tendon.  I did give him some stretches to try at home.  He can try massage and Voltaren gel to this area a few times per day as well.  Using gel heel cups may be helpful as well.

## 2020-10-08 NOTE — Patient Instructions (Signed)
Try icing and voltaren gel to back of the heel area.  You can use gel heel cups if needed. I don't think you need arch supports.    Achilles Tendinitis Rehab Ask your health care provider which exercises are safe for you. Do exercises exactly as told by your health care provider and adjust them as directed. It is normal to feel mild stretching, pulling, tightness, or discomfort as you do these exercises. Stop right away if you feel sudden pain or your pain gets worse. Do not begin these exercises until told by your health care provider. Stretching and range-of-motion exercises These exercises warm up your muscles and joints and improve the movement andflexibility of your ankle. These exercises also help to relieve pain. Standing wall calf stretch with straight knee  Stand with your hands against a wall. Extend your left / right leg behind you, and bend your front knee slightly. Keep both of your heels on the floor. Point the toes of your back foot slightly inward. Keeping your heels on the floor and your back knee straight, shift your weight toward the wall. Do not allow your back to arch. You should feel a gentle stretch in your upper calf. Hold this position for __________ seconds. Repeat __________ times. Complete this exercise __________ times a day. Standing wall calf stretch with bent knee Stand with your hands against a wall. Extend your left / right leg behind you, and bend your front knee slightly. Keep both of your heels on the floor. Point the toes of your back foot slightly inward. Keeping your heels on the floor, bend your back knee slightly. You should feel a gentle stretch deep in your lower calf near your heel. Hold this position for __________ seconds. Repeat __________ times. Complete this exercise __________ times a day. Strengthening exercises These exercises build strength and control of your ankle. Endurance is theability to use your muscles for a long time, even after  they get tired. Plantar flexion with band In this exercise, you push your toes downward, away from you, with an exercise band providing resistance. Sit on the floor with your left / right leg extended. You may put a pillow under your calf to give your foot more room to move. Loop a rubber exercise band or tube around the ball of your left / right foot. The ball of your foot is on the walking surface, right under your toes. The band or tube should be slightly tense when your foot is relaxed. If the band or tube slips, you can put on your shoe or put a washcloth between the band and your foot to help it stay in place. Slowly point your toes downward, pushing them away from you (plantar flexion). Hold this position for __________ seconds. Slowly release the tension in the band or tube, controlling smoothly until your foot is back to the starting position. Repeat steps 1-5 with your left / right leg. Repeat __________ times. Complete this exercise __________ times a day. Eccentric heel drop  In this exercise, you stand and slowly raise your heel and then slowly lower it. This exercise lengthens the calf muscles (eccentric) while the heel bears weight. If this exercise is too easy, try doing it while wearing a backpack with weights in it. Stand on a step with the balls of your feet. The ball of your foot is on the walking surface, right under your toes. Do not put your heels on the step. For balance, rest your hands on the  wall or on a railing. Rise up onto the balls of your feet. Keeping your heels up, shift all of your weight to your left / right leg and pick up your other leg. Slowly lower your left / right leg so your heel drops below the level of the step. Put down your other foot before returning to the start position. If told by your health care provider, build up to: 3 sets of 15 repetitions while keeping your knees straight. 3 sets of 15 repetitions while keeping your knees slightly bent as  far as told by your health care provider. Repeat __________ times. Complete this exercise __________ times a day. Balance exercises These exercises improve or maintain your balance. Balance is important inpreventing falls. Single leg stand If this exercise is too easy, you can try it with your eyes closed or while standing on a pillow. Without shoes, stand near a railing or in a door frame. Hold on to the railing or door frame as needed. Stand on your left / right foot. Keep your big toe down on the floor and try to keep your arch lifted. Hold this position for __________ seconds. Repeat __________ times. Complete this exercise __________ times a day. This information is not intended to replace advice given to you by your health care provider. Make sure you discuss any questions you have with your healthcare provider. Document Revised: 07/05/2018 Document Reviewed: 12/28/2017 Elsevier Patient Education  2022 [employer].

## 2020-10-08 NOTE — Progress Notes (Signed)
Gerald [name] - 80 y.o. male MRN [id number]  Date of birth: [birth year]  Subjective Chief Complaint  Patient presents with   Foot Pain    HPI Gerald [name] is a 80 year old male here today with complaint of foot pain.  Pain is located on the posterior heel around attachment of Achilles.  He denies any known injury or overuse.  He did recently get new shoes which seem to be helping quite a bit.  He denies any associated numbness or tingling.  There is no swelling around this area.  He does not really have any pain on the bottom of his feet.  ROS:  A comprehensive ROS was completed and negative except as noted per HPI  No Known Allergies  Past Medical History:  Diagnosis Date   Bursitis    Carotid artery disease (HCC)    COPD (chronic obstructive pulmonary disease) ([place])    Hypercholesterolemia    Lung nodule    nscl ca dx'd 08/2019   Psoriasis    Tobacco abuse    Wears glasses    reading    Past Surgical History:  Procedure Laterality Date   BRONCHIAL BIOPSY  09/27/2019   Procedure: BRONCHIAL BIOPSIES;  Surgeon: Garner Nash, DO;  Location: [facility];  Service: Pulmonary;;   BRONCHIAL BRUSHINGS  09/27/2019   Procedure: BRONCHIAL BRUSHINGS;  Surgeon: Garner Nash, DO;  Location: [place];  Service: Pulmonary;;   BRONCHIAL NEEDLE ASPIRATION BIOPSY  09/27/2019   Procedure: BRONCHIAL NEEDLE ASPIRATION BIOPSIES;  Surgeon: Garner Nash, DO;  Location: [place];  Service: Pulmonary;;   BRONCHIAL WASHINGS  09/27/2019   Procedure: BRONCHIAL WASHINGS;  Surgeon: Garner Nash, DO;  Location: [place];  Service: Pulmonary;;   COLONOSCOPY     CRYOTHERAPY  09/27/2019   Procedure: Cydney Ok;  Surgeon: Garner Nash, DO;  Location: [facility];  Service: Pulmonary;;   FIDUCIAL MARKER PLACEMENT  09/27/2019   Procedure: FIDUCIAL MARKER PLACEMENT;  Surgeon: Garner Nash, DO;  Location: [facility];  Service: Pulmonary;;   HEMOSTASIS CONTROL  09/27/2019   Procedure:  HEMOSTASIS CONTROL;  Surgeon: Garner Nash, DO;  Location: [facility];  Service: Pulmonary;;   HERNIA REPAIR     RIGHT/LEFT HEART CATH AND CORONARY ANGIOGRAPHY N/A 02/09/2020   Procedure: RIGHT/LEFT HEART CATH AND CORONARY ANGIOGRAPHY;  Surgeon: Burnell Blanks, MD;  Location: [place] [facility];  Service: Cardiovascular;  Laterality: N/A;   VIDEO BRONCHOSCOPY WITH ENDOBRONCHIAL NAVIGATION N/A 09/27/2019   Procedure: VIDEO BRONCHOSCOPY WITH ENDOBRONCHIAL NAVIGATION;  Surgeon: Garner Nash, DO;  Location: [place];  Service: Pulmonary;  Laterality: N/A;    Social History   Socioeconomic History   Marital status: Married    Spouse name: Not on file   Number of children: Not on file   Years of education: Not on file   Highest education level: Not on file  Occupational History   Not on file  Tobacco Use   Smoking status: Every Day    Packs/day: 2.00    Pack years: 0.00    Types: Cigarettes   Smokeless tobacco: Never  Vaping Use   Vaping Use: Never used  Substance and Sexual Activity   Alcohol use: Yes    Comment: social   Drug use: Never   Sexual activity: Not Currently  Other Topics Concern   Not on file  Social History Narrative   Not on file   Social Determinants of Health   Financial Resource Strain: Not on file  Food Insecurity: Not on file  Transportation Needs: Not on file  Physical Activity: Not on file  Stress: Not on file  Social Connections: Not on file    Family History  Problem Relation Age of Onset   Heart attack Father    Mesothelioma Sister 6   Lung cancer Brother     Health Maintenance  Topic Date Due   TETANUS/TDAP  Never done   Zoster Vaccines- Shingrix (1 of 2) Never done   PNA vac Low Risk Adult (2 of 2 - PPSV23) 03/06/2016   COVID-19 Vaccine (3 - Moderna risk series) 06/22/2019   INFLUENZA VACCINE  10/29/2020   HPV VACCINES  Aged Out      ----------------------------------------------------------------------------------------------------------------------------------------------------------------------------------------------------------------- Physical Exam BP 120/71 (BP Location: Left Arm, Patient Position: Sitting, Cuff Size: Normal)   Pulse 74   Ht 6' (1.829 m)   Wt 191 lb (86.6 kg)   SpO2 94%   BMI 25.90 kg/m   Physical Exam Constitutional:      Appearance: Normal appearance.  HENT:     Head: Normocephalic and atraumatic.  Musculoskeletal:     Comments: Normal posterior tibial and dorsalis pedis pulses bilaterally.  He does have tenderness to palpation at the distal Achilles attachment to the calcaneus on the left.  There is no swelling or Haglund deformity at this area.  Neurological:     Mental Status: He is alert.    ------------------------------------------------------------------------------------------------------------------------------------------------------------------------------------------------------------------- Assessment and Plan  Achilles tendon pain This is more of an enthesitis rather than a true tendinitis of the Achilles tendon.  I did give him some stretches to try at home.  He can try massage and Voltaren gel to this area a few times per day as well.  Using gel heel cups may be helpful as well.     No orders of the defined types were placed in this encounter.   No follow-ups on file.    This visit occurred during the SARS-CoV-2 public health emergency.  Safety protocols were in place, including screening questions prior to the visit, additional usage of staff PPE, and extensive cleaning of exam room while observing appropriate contact time as indicated for disinfecting solutions.

## 2020-10-24 ENCOUNTER — Other Ambulatory Visit: Payer: Self-pay | Admitting: Family Medicine

## 2020-10-24 DIAGNOSIS — M109 Gout, unspecified: Secondary | ICD-10-CM

## 2020-10-24 MED ORDER — COLCHICINE 0.6 MG PO TABS: ORAL_TABLET | ORAL | 1 refills | Status: DC

## 2020-11-20 ENCOUNTER — Other Ambulatory Visit: Payer: Self-pay

## 2020-11-20 ENCOUNTER — Encounter: Payer: Self-pay | Admitting: Family Medicine

## 2020-11-20 ENCOUNTER — Ambulatory Visit (INDEPENDENT_AMBULATORY_CARE_PROVIDER_SITE_OTHER): Payer: Medicare Other | Admitting: Family Medicine

## 2020-11-20 VITALS — BP 116/61 | HR 71 | Temp 98.5°F | Resp 17

## 2020-11-20 DIAGNOSIS — L89302 Pressure ulcer of unspecified buttock, stage 2: Secondary | ICD-10-CM | POA: Diagnosis not present

## 2020-11-20 NOTE — Progress Notes (Signed)
Acute Office Visit  Subjective:    Patient ID: Gerald [name], male    DOB: 03/05/1941, 80 y.o.   MRN: [id number]  Chief Complaint  Patient presents with   Rash    HPI Patient is in today for area on buttocks.   States a little over a month ago, patient noticed two sore areas on his bottom, one on each side. Reports it just feels sore and raw, no itching, burning, drainage, bleeding. Gerald [name] cannot put it on a pain scale, just states it is uncomfortable and causes him to have to reposition frequently to relieve pressure on the area. The pain is most noticeable when lying in the recliner which is how Gerald [name] has been resting/sleeping for the past several months. States Gerald [name] does not get much physical activity. Wife tried applying some OTC cream/moisturizer - patient states Gerald [name] couldn't tell a difference, but wife reports it looks a little bit better.   Gerald [name] denies any other rashes, fevers, body aches, unusual fatigue or weakness.      Past Medical History:  Diagnosis Date   Bursitis    Carotid artery disease ([place])    COPD (chronic obstructive pulmonary disease) ([place])    Hypercholesterolemia    Lung nodule    nscl ca dx'd 08/2019   Psoriasis    Tobacco abuse    Wears glasses    reading    Past Surgical History:  Procedure Laterality Date   BRONCHIAL BIOPSY  09/27/2019   Procedure: BRONCHIAL BIOPSIES;  Surgeon: Garner Nash, DO;  Location: [place] [facility];  Service: Pulmonary;;   BRONCHIAL BRUSHINGS  09/27/2019   Procedure: BRONCHIAL BRUSHINGS;  Surgeon: Garner Nash, DO;  Location: [place] [facility];  Service: Pulmonary;;   BRONCHIAL NEEDLE ASPIRATION BIOPSY  09/27/2019   Procedure: BRONCHIAL NEEDLE ASPIRATION BIOPSIES;  Surgeon: Garner Nash, DO;  Location: [facility];  Service: Pulmonary;;   BRONCHIAL WASHINGS  09/27/2019   Procedure: BRONCHIAL WASHINGS;  Surgeon: Garner Nash, DO;  Location: [facility];  Service: Pulmonary;;   COLONOSCOPY     CRYOTHERAPY  09/27/2019    Procedure: Cydney Ok;  Surgeon: Garner Nash, DO;  Location: [facility];  Service: Pulmonary;;   FIDUCIAL MARKER PLACEMENT  09/27/2019   Procedure: FIDUCIAL MARKER PLACEMENT;  Surgeon: Garner Nash, DO;  Location: [facility];  Service: Pulmonary;;   HEMOSTASIS CONTROL  09/27/2019   Procedure: HEMOSTASIS CONTROL;  Surgeon: Garner Nash, DO;  Location: [facility];  Service: Pulmonary;;   HERNIA REPAIR     RIGHT/LEFT HEART CATH AND CORONARY ANGIOGRAPHY N/A 02/09/2020   Procedure: RIGHT/LEFT HEART CATH AND CORONARY ANGIOGRAPHY;  Surgeon: Burnell Blanks, MD;  Location: [place] [facility];  Service: Cardiovascular;  Laterality: N/A;   VIDEO BRONCHOSCOPY WITH ENDOBRONCHIAL NAVIGATION N/A 09/27/2019   Procedure: VIDEO BRONCHOSCOPY WITH ENDOBRONCHIAL NAVIGATION;  Surgeon: Garner Nash, DO;  Location: [place];  Service: Pulmonary;  Laterality: N/A;    Family History  Problem Relation Age of Onset   Heart attack Father    Mesothelioma Sister 34   Lung cancer Brother     Social History   Socioeconomic History   Marital status: Married    Spouse name: Not on file   Number of children: Not on file   Years of education: Not on file   Highest education level: Not on file  Occupational History   Not on file  Tobacco Use   Smoking status: Every Day    Packs/day: 2.00  Types: Cigarettes   Smokeless tobacco: Never  Vaping Use   Vaping Use: Never used  Substance and Sexual Activity   Alcohol use: Yes    Comment: social   Drug use: Never   Sexual activity: Not Currently  Other Topics Concern   Not on file  Social History Narrative   Not on file   Social Determinants of Health   Financial Resource Strain: Not on file  Food Insecurity: Not on file  Transportation Needs: Not on file  Physical Activity: Not on file  Stress: Not on file  Social Connections: Not on file  Intimate Partner Violence: Not on file    Outpatient Medications Prior to Visit   Medication Sig Dispense Refill   acitretin (SORIATANE) 25 MG capsule Take 25 mg by mouth daily.     albuterol (VENTOLIN HFA) 108 (90 Base) MCG/ACT inhaler Inhale 2 puffs into the lungs every 4 (four) hours as needed for wheezing.     aspirin EC 81 MG tablet Take 1 tablet (81 mg total) by mouth daily. Swallow whole. 90 tablet 3   clobetasol cream (TEMOVATE) 5.99 % Apply 1 application topically daily as needed (Psoriasis).     colchicine 0.6 MG tablet Take 1.2mg  initially followed by 0.6mg  in 1 hour.  Continue 0.6mg  daily until improvement. 20 tablet 1   moxifloxacin (VIGAMOX) 0.5 % ophthalmic solution Place 1 drop into the right eye 4 (four) times daily.     Multiple Vitamins-Minerals (ICAPS) TABS Take 1 tablet by mouth in the morning and at bedtime. IREDS     Omega-3 Fatty Acids (FISH OIL) 1000 MG CAPS Take 1,000 mg by mouth daily.      rosuvastatin (CRESTOR) 20 MG tablet Take 1 tablet (20 mg total) by mouth daily. 30 tablet 11   vitamin B-12 (CYANOCOBALAMIN) 500 MCG tablet Take 500 mcg by mouth daily.     No facility-administered medications prior to visit.    No Known Allergies  Review of Systems All review of systems negative except what is listed in the HPI     Objective:    Physical Exam Constitutional:      Appearance: Normal appearance. Gerald [name] is normal weight.  Skin:    Capillary Refill: Capillary refill takes less than 2 seconds.     Findings: No rash.     Comments: Stg 2 pressure ulcer to bilateral buttocks (see picture), surrounding skin is blanchable, no drainage or odor, no induration, edema or erythema/streaking  Neurological:     General: No focal deficit present.     Mental Status: Gerald [name] is alert and oriented to person, place, and time. Mental status is at baseline.  Psychiatric:        Mood and Affect: Mood normal.        Behavior: Behavior normal.        Thought Content: Thought content normal.        Judgment: Judgment normal.         BP 116/61   Pulse 71    Temp 98.5 F (36.9 C)   Resp 17   SpO2 93%  Wt Readings from Last 3 Encounters:  10/08/20 191 lb (86.6 kg)  09/18/20 191 lb (86.6 kg)  08/31/20 187 lb 3.2 oz (84.9 kg)    Health Maintenance Due  Topic Date Due   TETANUS/TDAP  Never done   Zoster Vaccines- Shingrix (1 of 2) Never done   PNA vac Low Risk Adult (2 of 2 - PPSV23) 03/06/2016   COVID-19  Vaccine (3 - Moderna risk series) 06/22/2019   INFLUENZA VACCINE  10/29/2020    There are no preventive care reminders to display for this patient.   No results found for: TSH Lab Results  Component Value Date   WBC 7.4 08/28/2020   HGB 15.4 08/28/2020   HCT 47.2 08/28/2020   MCV 95.9 08/28/2020   PLT 174 08/28/2020   Lab Results  Component Value Date   NA 141 08/28/2020   K 4.6 08/28/2020   CO2 28 08/28/2020   GLUCOSE 88 08/28/2020   BUN 18 08/28/2020   CREATININE 1.01 08/28/2020   BILITOT 0.5 08/28/2020   ALKPHOS 67 08/28/2020   AST 13 (L) 08/28/2020   ALT 13 08/28/2020   PROT 7.0 08/28/2020   ALBUMIN 3.7 08/28/2020   CALCIUM 9.4 08/28/2020   ANIONGAP 11 08/28/2020   No results found for: CHOL No results found for: HDL No results found for: LDLCALC No results found for: TRIG No results found for: CHOLHDL No results found for: HGBA1C     Assessment & Plan:   1. Pressure injury of buttock, stage 2, unspecified laterality (HCC) Bilateral buttocks. See picture above. No slough or necrotic tissue. Recommending keeping clean with soap and water, regular sacral foam dressing use - given pictures of examples so they can look at pharmacy or order online. Can do neosporin if desirable, but no signs of infection today. Encouraged off-loading with repositioning and pillow support. Can use barrier cream on surrounding skin if Gerald [name] is having any incontinence or excess sweating/moisture to prevent further breakdown. Injury most likely related to sleeping in the recliner regularly. Wife states it is already looking a little bit  better since she has been applying OTC cream and making him be more conscious of it. They will keep monitoring areas and follow-up closely if not healing or if begins to look worse or new symptoms develop. Patient aware of signs/symptoms requiring further/urgent evaluation.  Follow-up in 1-2 months or as needed.   Purcell Nails Olevia Bowens, DNP, FNP-C

## 2020-11-20 NOTE — Patient Instructions (Signed)
   Can use neosporin as needed. Keep clean with gentle soap and water.  Keep covered with foam dressing like above. Consider barrier cream such as A&D ointment to surrounding skin if sweating, incontinence, etc. Off-load with repositioning and pillow support

## 2020-12-06 ENCOUNTER — Other Ambulatory Visit: Payer: Self-pay

## 2020-12-06 ENCOUNTER — Encounter (HOSPITAL_BASED_OUTPATIENT_CLINIC_OR_DEPARTMENT_OTHER): Payer: Self-pay | Admitting: Cardiology

## 2020-12-06 ENCOUNTER — Ambulatory Visit (INDEPENDENT_AMBULATORY_CARE_PROVIDER_SITE_OTHER): Payer: Medicare Other | Admitting: Cardiology

## 2020-12-06 VITALS — BP 110/62 | HR 90 | Ht 72.0 in | Wt 191.2 lb

## 2020-12-06 DIAGNOSIS — I2584 Coronary atherosclerosis due to calcified coronary lesion: Secondary | ICD-10-CM

## 2020-12-06 DIAGNOSIS — Z720 Tobacco use: Secondary | ICD-10-CM

## 2020-12-06 DIAGNOSIS — Z7189 Other specified counseling: Secondary | ICD-10-CM | POA: Diagnosis not present

## 2020-12-06 DIAGNOSIS — I255 Ischemic cardiomyopathy: Secondary | ICD-10-CM

## 2020-12-06 DIAGNOSIS — E7800 Pure hypercholesterolemia, unspecified: Secondary | ICD-10-CM | POA: Diagnosis not present

## 2020-12-06 DIAGNOSIS — I2510 Atherosclerotic heart disease of native coronary artery without angina pectoris: Secondary | ICD-10-CM | POA: Diagnosis not present

## 2020-12-06 DIAGNOSIS — Z87898 Personal history of other specified conditions: Secondary | ICD-10-CM

## 2020-12-06 NOTE — Progress Notes (Signed)
Cardiology Office Note:    Date:  12/06/2020   ID:  Gerald [name], DOB 09/04/1940, MRN [id number]  PCP:  [name], DO  Cardiologist:  Buford Dresser, MD  Referring MD: Olam Idler, MD   CC: follow up  History of Present Illness:    Gerald [name] is a 80 y.o. male with a hx of right upper lobe lung carcinoma who is seen for follow up today. I initially met him 01/26/20 as a new consult at the request of Olam Idler, MD for the evaluation and management of bradycardia.  From [facility]:  Echo dated 01/11/20 has an incomplete report in the scanned records. Per Care Everywhere, summary was a technically difficult study, no effusion, globally decreased EF (reported at 40%), no significant valve disease.  Note from 11/29/19 from Dr. Carlis Abbott notes ECG with second degree AV block, type 1. Pulse charted as 50 bpm. Planned for ambulatory monitor and echo.   [employer] monitor result report scanned. Notes 6 days of use. Per report, most severe heart block was first degree AV block, slowest rate during this was 63 bpm. Did have PAC burden of 37%, PVC burden <1%, one episode of NSVT at 126 bpm (4 beats).   Cardiac management: bradycardia limits use of beta blocker and hypotension/lightheadedness limits ACEi/ARB/ARNI/MRA. He is taking aspirin 81 mg daily without issue. Tolerating rosuvastatin.  Today: Just had cataract surgery, doing well. Naps, minimally active during the day but no limitations.   Overall doing well. Appetite is good. No swelling. No limitations in activity, though there isn't much he wants to do. Tolerating medications. Discussed increasing walking on flat ground as much as he can tolerate.   Denies chest pain, shortness of breath at rest or with normal exertion. No PND, orthopnea, LE edema or unexpected weight gain. No syncope or palpitations.   Still smoking, precontemplative. Reviewed recommendations for diet and exercise today.  Past Medical History:   Diagnosis Date   Bursitis    Carotid artery disease ([place])    COPD (chronic obstructive pulmonary disease) ([place])    Hypercholesterolemia    Lung nodule    nscl ca dx'd 08/2019   Psoriasis    Tobacco abuse    Wears glasses    reading    Past Surgical History:  Procedure Laterality Date   BRONCHIAL BIOPSY  09/27/2019   Procedure: BRONCHIAL BIOPSIES;  Surgeon: Garner Nash, DO;  Location: [place] [facility];  Service: Pulmonary;;   BRONCHIAL BRUSHINGS  09/27/2019   Procedure: BRONCHIAL BRUSHINGS;  Surgeon: Garner Nash, DO;  Location: [place] [facility];  Service: Pulmonary;;   BRONCHIAL NEEDLE ASPIRATION BIOPSY  09/27/2019   Procedure: BRONCHIAL NEEDLE ASPIRATION BIOPSIES;  Surgeon: Garner Nash, DO;  Location: [place] [facility];  Service: Pulmonary;;   BRONCHIAL WASHINGS  09/27/2019   Procedure: BRONCHIAL WASHINGS;  Surgeon: Garner Nash, DO;  Location: [place] [facility];  Service: Pulmonary;;   COLONOSCOPY     CRYOTHERAPY  09/27/2019   Procedure: Cydney Ok;  Surgeon: Garner Nash, DO;  Location: [place] [facility];  Service: Pulmonary;;   FIDUCIAL MARKER PLACEMENT  09/27/2019   Procedure: FIDUCIAL MARKER PLACEMENT;  Surgeon: Garner Nash, DO;  Location: [place] [facility];  Service: Pulmonary;;   HEMOSTASIS CONTROL  09/27/2019   Procedure: HEMOSTASIS CONTROL;  Surgeon: Garner Nash, DO;  Location: [place] [facility];  Service: Pulmonary;;   HERNIA REPAIR     RIGHT/LEFT HEART CATH AND CORONARY ANGIOGRAPHY N/A 02/09/2020   Procedure: RIGHT/LEFT HEART CATH AND CORONARY ANGIOGRAPHY;  Surgeon: Burnell Blanks, MD;  Location: [place];  Service: Cardiovascular;  Laterality: N/A;   VIDEO BRONCHOSCOPY WITH ENDOBRONCHIAL NAVIGATION N/A 09/27/2019   Procedure: VIDEO BRONCHOSCOPY WITH ENDOBRONCHIAL NAVIGATION;  Surgeon: Garner Nash, DO;  Location: [facility];  Service: Pulmonary;  Laterality: N/A;    Current Medications: Current Outpatient Medications on File Prior to Visit   Medication Sig   acitretin (SORIATANE) 25 MG capsule Take 25 mg by mouth daily.   albuterol (VENTOLIN HFA) 108 (90 Base) MCG/ACT inhaler Inhale 2 puffs into the lungs every 4 (four) hours as needed for wheezing.   aspirin EC 81 MG tablet Take 1 tablet (81 mg total) by mouth daily. Swallow whole.   clobetasol cream (TEMOVATE) 8.54 % Apply 1 application topically daily as needed (Psoriasis).   colchicine 0.6 MG tablet Take 1.42m initially followed by 0.650min 1 hour.  Continue 0.52m72maily until improvement.   moxifloxacin (VIGAMOX) 0.5 % ophthalmic solution Place 1 drop into the right eye 4 (four) times daily.   Multiple Vitamins-Minerals (ICAPS) TABS Take 1 tablet by mouth in the morning and at bedtime. IREDS   Omega-3 Fatty Acids (FISH OIL) 1000 MG CAPS Take 1,000 mg by mouth daily.    vitamin B-12 (CYANOCOBALAMIN) 500 MCG tablet Take 500 mcg by mouth daily.   rosuvastatin (CRESTOR) 20 MG tablet Take 1 tablet (20 mg total) by mouth daily.   No current facility-administered medications on file prior to visit.     Allergies:   Patient has no known allergies.   Social History   Tobacco Use   Smoking status: Every Day    Packs/day: 2.00    Types: Cigarettes   Smokeless tobacco: Never  Vaping Use   Vaping Use: Never used  Substance Use Topics   Alcohol use: Yes    Comment: social   Drug use: Never    Family History: family history includes Heart attack in his father; Lung cancer in his brother; Mesothelioma (age of onset: 50)51n his sister.  ROS:   Please see the history of present illness.  Additional pertinent ROS otherwise unremarkable.     EKGs/Labs/Other Studies Reviewed:    The following studies were reviewed today: Outside testing summarized in HPI Cardiac cath 02/09/20 Mid Cx to Dist Cx lesion is 100% stenosed. 2nd Mrg lesion is 99% stenosed. Ost LM to Dist LM lesion is 30% stenosed. Prox LAD to Mid LAD lesion is 30% stenosed. Ost Cx to Mid Cx lesion is 30%  stenosed. Prox RCA lesion is 99% stenosed.   1. The LAD is a large caliber vessel that courses to the apex. Diffuse mild calcified disease without any focally obstructive lesions.  2. The Circumflex is a large dominant vessel. There is heavy calcification in the proximal and mid vessel. The mid Circumflex is chronically occluded just beyond a small caliber obtuse marginal branch. The left sided PDA fills from left to left collaterals.  3. The small non-dominant RCA has severe diffuse proximal and mid vessel disease. This non-dominant vessel is too small for PCI.  4. Normal right and left heart pressures   Recommendations: Medical management of CAD. His mid Circumflex is chronically occluded and fills briskly from left to left collaterals. The RCA is a small non-dominant vessel with severe diffuse proximal and mid disease. Could consider viability study to assess the lateral wall and if viable myocardium, review his films with the CTO team to consider PCI of the Circumflex. He seems to be  asymptomatic at this time so the best approach may be medical management of his CAD without an attempt at PCI.   EKG:  EKG is personally reviewed.  12/06/20 SR/SA at 90 bpm with 1st degree AV block 01/26/20 SR with SA at 85 bpm  Recent Labs: 08/28/2020: ALT 13; BUN 18; Creatinine 1.01; Hemoglobin 15.4; Platelet Count 174; Potassium 4.6; Sodium 141  Recent Lipid Panel No results found for: CHOL, TRIG, HDL, CHOLHDL, VLDL, LDLCALC, LDLDIRECT  Physical Exam:    VS:  BP 110/62   Pulse 90   Ht 6' (1.829 m)   Wt 191 lb 3.2 oz (86.7 kg)   SpO2 97%   BMI 25.93 kg/m     Wt Readings from Last 3 Encounters:  12/06/20 191 lb 3.2 oz (86.7 kg)  10/08/20 191 lb (86.6 kg)  09/18/20 191 lb (86.6 kg)    GEN: Well nourished, well developed in no acute distress HEENT: Normal, moist mucous membranes NECK: No JVD CARDIAC: regular rhythm, normal S1 and S2, no rubs or gallops. No murmur. VASCULAR: Radial and DP pulses 2+  bilaterally. No carotid bruits RESPIRATORY:  Clear to auscultation without rales, wheezing or rhonchi  ABDOMEN: Soft, non-tender, non-distended MUSCULOSKELETAL:  Ambulates independently SKIN: Warm and dry, no edema NEUROLOGIC:  Alert and oriented x 3. No focal neuro deficits noted. PSYCHIATRIC:  Normal affect    ASSESSMENT:    1. Coronary artery disease due to calcified coronary lesion   2. Ischemic cardiomyopathy   3. Pure hypercholesterolemia   4. Tobacco abuse   5. Counseling on health promotion and disease prevention   6. History of bradycardia     PLAN:    Coronary artery disease, with CTO and collaterals as above Ischemic cardiomyopathy Hypercholesterolemia, goal LDL <70 -tolerating aspirin, continue -tolerating rosuvastatin 20 mg daily.  -no beta blocker given bradycardia history -no blood pressure room for ACEi/ARB/ARNI/MRA -asymptomatic -counseled on signs of heart failure, what to watch for, daily weights, etc -he has general fatigue, of unclear etiology. He appears euvolemic. Discussed heart failure education, signs/symptoms that need immediate medical attention.  Bradycardia: based on monitor results, no high degree block. Avoid AV nodal agents. Asymptomatic  Tobacco use counseling: counseled on importance of this. He is precontemplative  Cardiac risk counseling and prevention recommendations: -recommend heart healthy/Mediterranean diet, with whole grains, fruits, vegetable, fish, lean meats, nuts, and olive oil. Limit salt. -recommend moderate walking, 3-5 times/week for 30-50 minutes each session. Aim for at least 150 minutes.week. Goal should be pace of 3 miles/hours, or walking 1.5 miles in 30 minutes -recommend avoidance of tobacco products. Avoid excess alcohol.  Plan for follow up: 6 mos or sooner as needed  Buford Dresser, MD, PhD [place]  [facility]   Medication Adjustments/Labs and Tests Ordered: Current medicines are reviewed at  length with the patient today.  Concerns regarding medicines are outlined above.  Orders Placed This Encounter  Procedures   EKG 12-Lead    No orders of the defined types were placed in this encounter.   Patient Instructions  Medication Instructions:  Your Physician recommend you continue on your current medication as directed.    *If you need a refill on your cardiac medications before your next appointment, please call your pharmacy*   Lab Work: None ordered today   Testing/Procedures: None ordered today   Follow-Up: At [facility], you and your health needs are our priority.  As part of our continuing mission to provide you with exceptional heart care, we  have created designated Provider Care Teams.  These Care Teams include your primary Cardiologist (physician) and Advanced Practice Providers (APPs -  Physician Assistants and Nurse Practitioners) who all work together to provide you with the care you need, when you need it.  We recommend signing up for the patient portal called "MyChart".  Sign up information is provided on this After Visit Summary.  MyChart is used to connect with patients for Virtual Visits (Telemedicine).  Patients are able to view lab/test results, encounter notes, upcoming appointments, etc.  Non-urgent messages can be sent to your provider as well.   To learn more about what you can do with MyChart, go to [email or web address].    Your next appointment:   6 month(s)  The format for your next appointment:   In Person  Provider:   Buford Dresser, MD    Signed, Buford Dresser, MD PhD 12/06/2020  [place]

## 2020-12-06 NOTE — Patient Instructions (Signed)
Medication Instructions:  Your Physician recommend you continue on your current medication as directed.    *If you need a refill on your cardiac medications before your next appointment, please call your pharmacy*   Lab Work: None ordered today   Testing/Procedures: None ordered today   Follow-Up: At [facility], you and your health needs are our priority.  As part of our continuing mission to provide you with exceptional heart care, we have created designated Provider Care Teams.  These Care Teams include your primary Cardiologist (physician) and Advanced Practice Providers (APPs -  Physician Assistants and Nurse Practitioners) who all work together to provide you with the care you need, when you need it.  We recommend signing up for the patient portal called "MyChart".  Sign up information is provided on this After Visit Summary.  MyChart is used to connect with patients for Virtual Visits (Telemedicine).  Patients are able to view lab/test results, encounter notes, upcoming appointments, etc.  Non-urgent messages can be sent to your provider as well.   To learn more about what you can do with MyChart, go to [email or web address].    Your next appointment:   6 month(s)  The format for your next appointment:   In Person  Provider:   Buford Dresser, MD

## 2020-12-10 ENCOUNTER — Telehealth: Payer: Self-pay | Admitting: Pulmonary Disease

## 2020-12-10 ENCOUNTER — Telehealth: Payer: Medicare Other | Admitting: Family Medicine

## 2020-12-10 NOTE — Telephone Encounter (Signed)
Called and spoke with Patient.  Patient is not having any symptoms of covid at this time.  Patient stated Wife tested positive for covid this morning. Advised Patient to self quarantine and wear mask if possible.  Patient's Wife is wearing mask at home and staying away from him. Advised patient to call if needed, and get covid tested if he has any symptoms. Nothing further at this time.

## 2021-01-07 ENCOUNTER — Ambulatory Visit (INDEPENDENT_AMBULATORY_CARE_PROVIDER_SITE_OTHER): Payer: Medicare Other

## 2021-01-07 ENCOUNTER — Other Ambulatory Visit: Payer: Self-pay

## 2021-01-07 DIAGNOSIS — Z23 Encounter for immunization: Secondary | ICD-10-CM

## 2021-02-28 ENCOUNTER — Ambulatory Visit (HOSPITAL_COMMUNITY)
Admission: RE | Admit: 2021-02-28 | Discharge: 2021-02-28 | Disposition: A | Discharge status: Home / Self Care | Payer: Medicare Other | Source: Ambulatory Visit | Attending: Internal Medicine | Admitting: Internal Medicine

## 2021-02-28 ENCOUNTER — Inpatient Hospital Stay: Payer: Medicare Other | Attending: Internal Medicine

## 2021-02-28 ENCOUNTER — Other Ambulatory Visit: Payer: Self-pay

## 2021-02-28 DIAGNOSIS — C3490 Malignant neoplasm of unspecified part of unspecified bronchus or lung: Secondary | ICD-10-CM | POA: Insufficient documentation

## 2021-02-28 DIAGNOSIS — Z85118 Personal history of other malignant neoplasm of bronchus and lung: Secondary | ICD-10-CM | POA: Insufficient documentation

## 2021-02-28 LAB — CBC WITH DIFFERENTIAL (CANCER CENTER ONLY)
Abs Immature Granulocytes: 0.02 10*3/uL (ref 0.00–0.07)
Basophils Absolute: 0.1 10*3/uL (ref 0.0–0.1)
Basophils Relative: 1 %
Eosinophils Absolute: 0.2 10*3/uL (ref 0.0–0.5)
Eosinophils Relative: 2 %
HCT: 44.9 % (ref 39.0–52.0)
Hemoglobin: 14.9 g/dL (ref 13.0–17.0)
Immature Granulocytes: 0 %
Lymphocytes Relative: 21 %
Lymphs Abs: 1.5 10*3/uL (ref 0.7–4.0)
MCH: 31.4 pg (ref 26.0–34.0)
MCHC: 33.2 g/dL (ref 30.0–36.0)
MCV: 94.5 fL (ref 80.0–100.0)
Monocytes Absolute: 0.8 10*3/uL (ref 0.1–1.0)
Monocytes Relative: 11 %
Neutro Abs: 4.6 10*3/uL (ref 1.7–7.7)
Neutrophils Relative %: 65 %
Platelet Count: 173 10*3/uL (ref 150–400)
RBC: 4.75 MIL/uL (ref 4.22–5.81)
RDW: 14.3 % (ref 11.5–15.5)
WBC Count: 7.1 10*3/uL (ref 4.0–10.5)
nRBC: 0 % (ref 0.0–0.2)

## 2021-02-28 LAB — CMP (CANCER CENTER ONLY)
ALT: 16 U/L (ref 0–44)
AST: 18 U/L (ref 15–41)
Albumin: 3.7 g/dL (ref 3.5–5.0)
Alkaline Phosphatase: 71 U/L (ref 38–126)
Anion gap: 8 (ref 5–15)
BUN: 16 mg/dL (ref 8–23)
CO2: 28 mmol/L (ref 22–32)
Calcium: 9 mg/dL (ref 8.9–10.3)
Chloride: 105 mmol/L (ref 98–111)
Creatinine: 0.94 mg/dL (ref 0.61–1.24)
GFR, Estimated: >60 mL/min (ref >60)
Glucose, Bld: 88 mg/dL (ref 70–99)
Potassium: 4.7 mmol/L (ref 3.5–5.1)
Sodium: 141 mmol/L (ref 135–145)
Total Bilirubin: 0.4 mg/dL (ref 0.3–1.2)
Total Protein: 7 g/dL (ref 6.5–8.1)

## 2021-02-28 MED ORDER — IOHEXOL 350 MG/ML SOLN
60.0000 mL | Freq: Once | INTRAVENOUS | Status: AC | PRN
  Administered 2021-02-28: 60 mL via INTRAVENOUS

## 2021-03-01 ENCOUNTER — Telehealth: Payer: Self-pay

## 2021-03-01 NOTE — Telephone Encounter (Signed)
Call received from [facility] w/[facility] highlighting impression:  1. New mass-like area in the medial aspect of the right upper lobe anteriorly measuring 2.4 x 3.9 x 2.3 cm, with apparent invasion of the mediastinal fat, and bulky right paratracheal lymphadenopathy, highly concerning for neoplasm. This is geographically separate from the previously treated right upper lobe nodule which was located more laterally, such that this may represent a new neoplasm. Further evaluation with PET-CT is suggested to better evaluate these findings and assess for the possibility of distal metastatic disease.

## 2021-03-04 ENCOUNTER — Other Ambulatory Visit: Payer: Self-pay

## 2021-03-04 ENCOUNTER — Inpatient Hospital Stay (HOSPITAL_BASED_OUTPATIENT_CLINIC_OR_DEPARTMENT_OTHER): Payer: Medicare Other | Admitting: Internal Medicine

## 2021-03-04 VITALS — BP 105/58 | HR 83 | Temp 97.5°F | Resp 20 | Ht 72.0 in | Wt 193.9 lb

## 2021-03-04 DIAGNOSIS — C3431 Malignant neoplasm of lower lobe, right bronchus or lung: Secondary | ICD-10-CM

## 2021-03-04 DIAGNOSIS — Z85118 Personal history of other malignant neoplasm of bronchus and lung: Secondary | ICD-10-CM | POA: Diagnosis present

## 2021-03-04 DIAGNOSIS — C3490 Malignant neoplasm of unspecified part of unspecified bronchus or lung: Secondary | ICD-10-CM | POA: Diagnosis not present

## 2021-03-04 NOTE — Progress Notes (Signed)
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  PRIOR THERAPY: SBRT to the right upper lobe pulmonary nodule as well as the right lower lobe pulmonary nodule under the care of Dr. Sondra Come completed on 11/14/2019.  CURRENT THERAPY: Observation.  INTERVAL HISTORY: Gerald [name] 80 y.o. male returns to the clinic today for follow-up visit accompanied by his wife.  The patient is feeling fine today with no concerning complaints except for shortness of breath with exertion.  He denied having any cough, chest pain or hemoptysis.  He denied having any nausea, vomiting, diarrhea or constipation.  He has no headache or visual changes.  The patient had repeat CT scan of the chest performed recently and he is here for evaluation and discussion of his scan results.  MEDICAL HISTORY: Past Medical History:  Diagnosis Date   Bursitis    Carotid artery disease (HCC)    COPD (chronic obstructive pulmonary disease) (HCC)    Hypercholesterolemia    Lung nodule    nscl ca dx'd 08/2019   Psoriasis    Tobacco abuse    Wears glasses    reading    ALLERGIES:  has No Known Allergies.  MEDICATIONS:  Current Outpatient Medications  Medication Sig Dispense Refill   acitretin (SORIATANE) 25 MG capsule Take 25 mg by mouth daily.     albuterol (VENTOLIN HFA) 108 (90 Base) MCG/ACT inhaler Inhale 2 puffs into the lungs every 4 (four) hours as needed for wheezing.     aspirin EC 81 MG tablet Take 1 tablet (81 mg total) by mouth daily. Swallow whole. 90 tablet 3   clobetasol cream (TEMOVATE) 6.31 % Apply 1 application topically daily as needed (Psoriasis).     colchicine 0.6 MG  tablet Take 1.2mg  initially followed by 0.6mg  in 1 hour.  Continue 0.6mg  daily until improvement. 20 tablet 1   moxifloxacin (VIGAMOX) 0.5 % ophthalmic solution Place 1 drop into the right eye 4 (four) times daily.     Multiple Vitamins-Minerals (ICAPS) TABS Take 1 tablet by mouth in the morning and at bedtime. IREDS     Omega-3 Fatty Acids (FISH OIL) 1000 MG CAPS Take 1,000 mg by mouth daily.      rosuvastatin (CRESTOR) 20 MG tablet Take 1 tablet (20 mg total) by mouth daily. 30 tablet 11   vitamin B-12 (CYANOCOBALAMIN) 500 MCG tablet Take 500 mcg by mouth daily.     No current facility-administered medications for this visit.    SURGICAL HISTORY:  Past Surgical History:  Procedure Laterality Date   BRONCHIAL BIOPSY  09/27/2019   Procedure: BRONCHIAL BIOPSIES;  Surgeon: Garner Nash, DO;  Location: [facility];  Service: Pulmonary;;   BRONCHIAL BRUSHINGS  09/27/2019   Procedure: BRONCHIAL BRUSHINGS;  Surgeon: Garner Nash, DO;  Location: [facility];  Service: Pulmonary;;   BRONCHIAL NEEDLE ASPIRATION BIOPSY  09/27/2019   Procedure: BRONCHIAL NEEDLE ASPIRATION BIOPSIES;  Surgeon: Garner Nash, DO;  Location: [facility];  Service: Pulmonary;;   BRONCHIAL WASHINGS  09/27/2019   Procedure: BRONCHIAL WASHINGS;  Surgeon: Garner Nash, DO;  Location: [facility];  Service: Pulmonary;;   COLONOSCOPY     CRYOTHERAPY  09/27/2019  Procedure: CRYOTHERAPY;  Surgeon: Garner Nash, DO;  Location: [facility];  Service: Pulmonary;;   FIDUCIAL MARKER PLACEMENT  09/27/2019   Procedure: FIDUCIAL MARKER PLACEMENT;  Surgeon: Garner Nash, DO;  Location: [facility];  Service: Pulmonary;;   HEMOSTASIS CONTROL  09/27/2019   Procedure: HEMOSTASIS CONTROL;  Surgeon: Garner Nash, DO;  Location: [facility];  Service: Pulmonary;;   HERNIA REPAIR     RIGHT/LEFT HEART CATH AND CORONARY ANGIOGRAPHY N/A 02/09/2020   Procedure: RIGHT/LEFT HEART CATH AND CORONARY ANGIOGRAPHY;  Surgeon:  Burnell Blanks, MD;  Location: [facility];  Service: Cardiovascular;  Laterality: N/A;   VIDEO BRONCHOSCOPY WITH ENDOBRONCHIAL NAVIGATION N/A 09/27/2019   Procedure: VIDEO BRONCHOSCOPY WITH ENDOBRONCHIAL NAVIGATION;  Surgeon: Garner Nash, DO;  Location: [facility];  Service: Pulmonary;  Laterality: N/A;    REVIEW OF SYSTEMS:  A comprehensive review of systems was negative except for: Respiratory: positive for dyspnea on exertion   PHYSICAL EXAMINATION: General appearance: alert, cooperative, and no distress Head: Normocephalic, without obvious abnormality, atraumatic Neck: no adenopathy, no JVD, supple, symmetrical, trachea midline, and thyroid not enlarged, symmetric, no tenderness/mass/nodules Lymph nodes: Cervical, supraclavicular, and axillary nodes normal. Resp: clear to auscultation bilaterally Back: symmetric, no curvature. ROM normal. No CVA tenderness. Cardio: regular rate and rhythm, S1, S2 normal, no murmur, click, rub or gallop GI: soft, non-tender; bowel sounds normal; no masses,  no organomegaly Extremities: extremities normal, atraumatic, no cyanosis or edema  ECOG PERFORMANCE STATUS: 1 - Symptomatic but completely ambulatory  Blood pressure (!) 105/58, pulse 83, temperature (!) 97.5 F (36.4 C), temperature source Tympanic, resp. rate 20, height 6' (1.829 m), weight 193 lb 14.4 oz (88 kg), SpO2 96 %.  LABORATORY DATA: Lab Results  Component Value Date   WBC 7.1 02/28/2021   HGB 14.9 02/28/2021   HCT 44.9 02/28/2021   MCV 94.5 02/28/2021   PLT 173 02/28/2021      Chemistry      Component Value Date/Time   NA 141 02/28/2021 1055   NA 139 02/07/2020 1208   K 4.7 02/28/2021 1055   CL 105 02/28/2021 1055   CO2 28 02/28/2021 1055   BUN 16 02/28/2021 1055   BUN 17 02/07/2020 1208   CREATININE 0.94 02/28/2021 1055      Component Value Date/Time   CALCIUM 9.0 02/28/2021 1055   ALKPHOS 71 02/28/2021 1055   AST 18 02/28/2021 1055   ALT 16  02/28/2021 1055   BILITOT 0.4 02/28/2021 1055       RADIOGRAPHIC STUDIES: CT Chest W Contrast  Result Date: 03/01/2021 CLINICAL DATA:  Primary Cancer Type: Lung Imaging Indication: Routine surveillance Interval therapy since last imaging? No Initial Cancer Diagnosis Date: 09/27/2019; Established by: Biopsy-proven Detailed Pathology: Stage Ia non-small cell lung cancer, squamous cell carcinoma. Primary Tumor location: Right upper lobe and right lower lobe. Surgeries: Endobronchial cryotherapy 09/27/2019. Chemotherapy: No Immunotherapy? No Radiation therapy? Yes; Date Range: 11/01/2019 - 11/14/2019; Target: Right lung EXAM: CT CHEST WITH CONTRAST TECHNIQUE: Multidetector CT imaging of the chest was performed during intravenous contrast administration. CONTRAST:  4mL OMNIPAQUE IOHEXOL 350 MG/ML SOLN COMPARISON:  Most recent CT chest 08/28/2020.  09/12/2019 PET-CT. FINDINGS: Cardiovascular: Heart size is normal. There is no significant pericardial fluid, thickening or pericardial calcification. There is aortic atherosclerosis, as well as atherosclerosis of the great vessels of the mediastinum and the coronary arteries, including calcified atherosclerotic plaque in the left main, left anterior descending, left circumflex and right coronary arteries. Mediastinum/Nodes:  Enlarged right paratracheal lymph node measuring 3.5 cm in short axis. No definite hilar lymphadenopathy. Esophagus is unremarkable in appearance. No axillary lymphadenopathy. Lungs/Pleura: Small chronic right-sided pleural effusion with chronic right-sided pleural thickening and enhancement, and associated areas of chronic rounded atelectasis in both the right middle and lower lobes. In the lateral aspect of the right upper lobe there is a fiducial marker surrounded by some nodular architectural distortion, likely to represent chronic postradiation fibrosis at the site of the prior treated neoplasm. In the anterior aspect of the right upper lobe  (axial image 72 of series 2 and coronal image 46 of series 6) there is a new mass-like area which measures approximately 2.4 x 3.9 x 2.3 cm which appears to directly invade the mediastinum, highly concerning for neoplasm. No left-sided suspicious appearing pulmonary nodules or masses are noted. No consolidative airspace disease. No left pleural effusion. Upper Abdomen: Aortic atherosclerosis. Colonic diverticulosis in the region of the transverse colon. Musculoskeletal: There are no aggressive appearing lytic or blastic lesions noted in the visualized portions of the skeleton. IMPRESSION: 1. New mass-like area in the medial aspect of the right upper lobe anteriorly measuring 2.4 x 3.9 x 2.3 cm, with apparent invasion of the mediastinal fat, and bulky right paratracheal lymphadenopathy, highly concerning for neoplasm. This is geographically separate from the previously treated right upper lobe nodule which was located more laterally, such that this may represent a new neoplasm. Further evaluation with PET-CT is suggested to better evaluate these findings and assess for the possibility of distal metastatic disease. 2. Small chronic right-sided pleural effusion with areas of rounded atelectasis in the right middle and lower lobes, similar to the prior examination. 3. Aortic atherosclerosis, in addition to left main and 3 vessel coronary artery disease. 4. Colonic diverticulosis. These results will be called to the ordering clinician or representative by the Radiologist Assistant, and communication documented in the PACS or [employer]. Electronically Signed   By: Vinnie Langton M.D.   On: 03/01/2021 10:59     ASSESSMENT AND PLAN: This is a very pleasant 80 years old white male recently diagnosed with synchronous stage IA non-small cell lung cancer, squamous cell carcinoma involving the right upper lobe pulmonary nodule in addition to right lower lobe endobronchial lesion diagnosed in June 2021 status post  SBRT to these lesions under the care of Dr. Sondra Come. The patient had repeat CT scan of the chest performed recently.  I personally and independently reviewed the scan images and discussed the result and showed the images to the patient today. His scan showed some concerning findings for disease recurrence with new masslike area in the medial aspect of the right upper lobe with apparent invasion of the mediastinal fat and bulky right paratracheal lymphadenopathy highly concerning for neoplasm. I recommended for the patient to have a PET scan for further evaluation of this lesion and to rule out disease recurrence. I will see him back for follow-up visit in 2 weeks for evaluation and discussion of the PET scan results and recommendation regarding his condition. The patient was advised to call immediately if he has any other concerning symptoms in the interval. The patient is currently on observation and he is feeling fine today with no concerning complaints except for shortness of breath with exertion. The patient voices understanding of current disease status and treatment options and is in agreement with the current care plan.  All questions were answered. The patient knows to call the clinic with any problems, questions or  concerns. We can certainly see the patient much sooner if necessary.   Disclaimer: This note was dictated with voice recognition software. Similar sounding words can inadvertently be transcribed and may not be corrected upon review.

## 2021-03-05 IMAGING — CT CT CHEST W/ CM
2 of 4 series · 15 of 36 positions shown, 18 images · IV contrast (omnipaque)
Comparison: 09/22/2019

CLINICAL DATA: Non-small-cell lung cancer.  Restaging.

EXAM:
CT CHEST WITH CONTRAST
TECHNIQUE: Multidetector CT imaging of the chest was performed during
intravenous contrast administration.
CONTRAST:  75mL OMNIPAQUE IOHEXOL 300 MG/ML  SOLN

[Series 2: axial st · axial · 0.84mm/px · z∈[+1299,+1571]mm · 12 of 162 slices shown, 15 images]
[im 13/162  mediastinal]
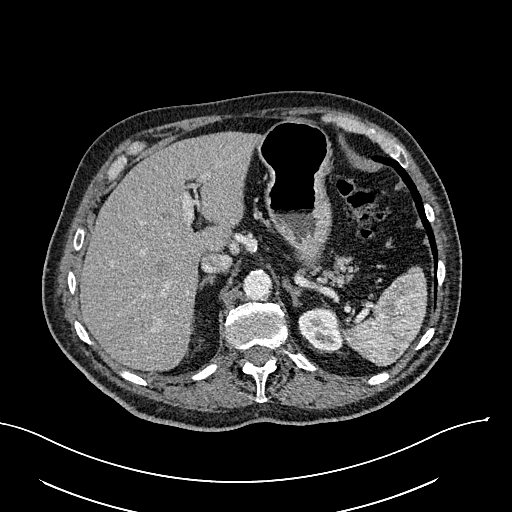
[im 13/162  lung]
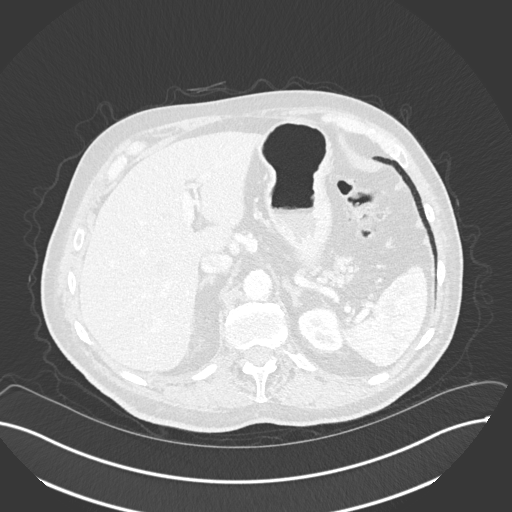
[im 25/162  lung]
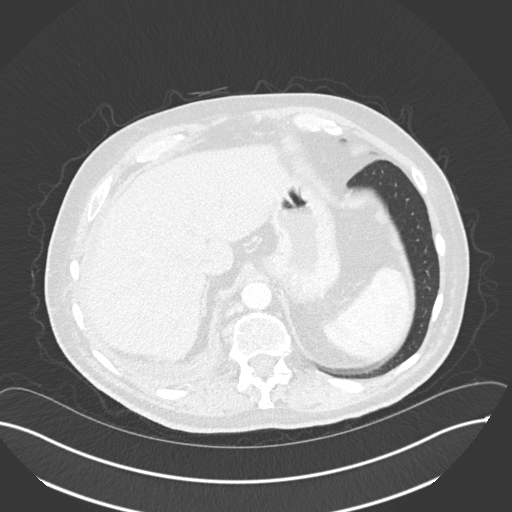
[im 38/162  lung]
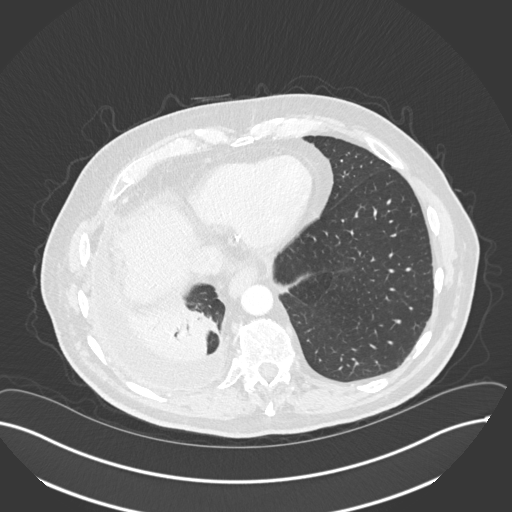
[im 50/162  lung]
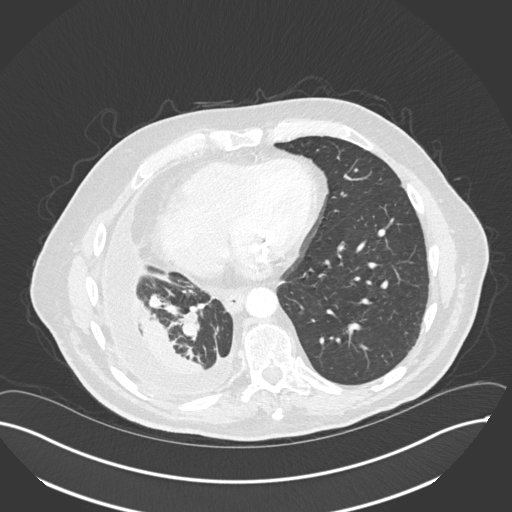
[im 62/162  mediastinal]
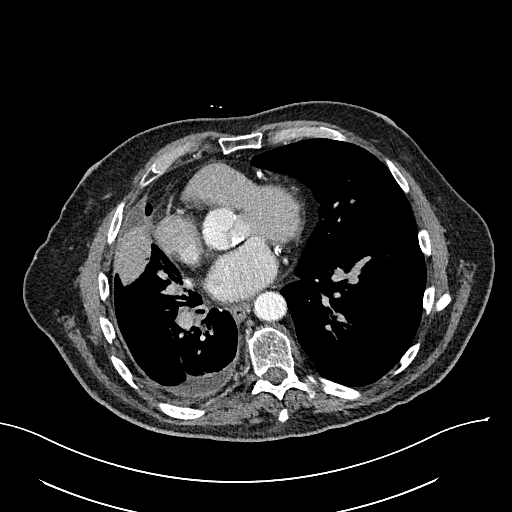
[im 62/162  lung]
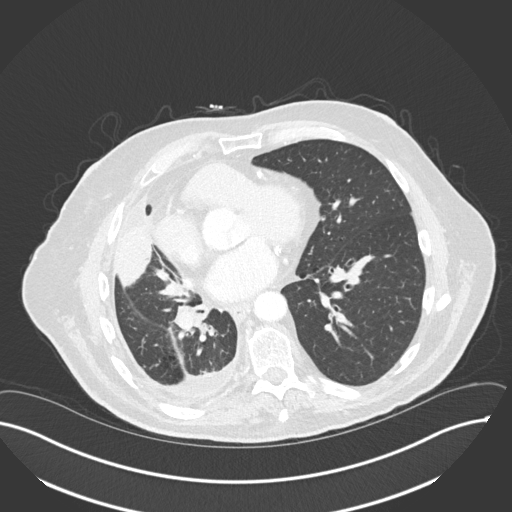
[im 75/162  lung]
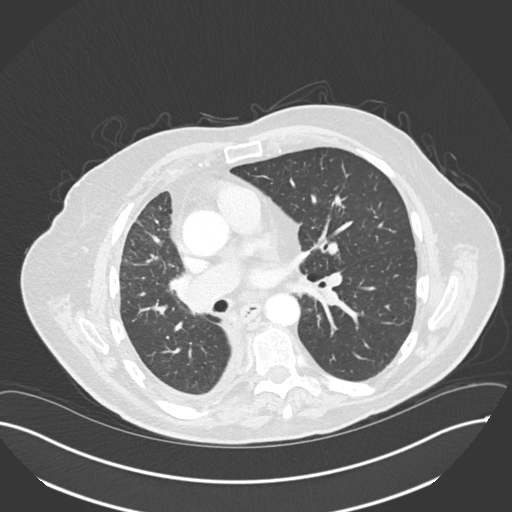
[im 87/162  lung]
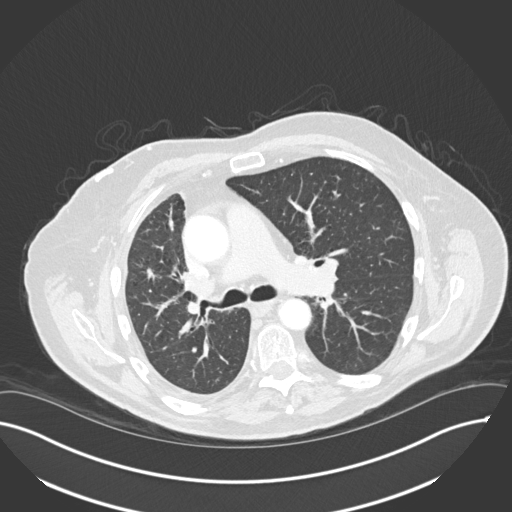
[im 100/162  lung]
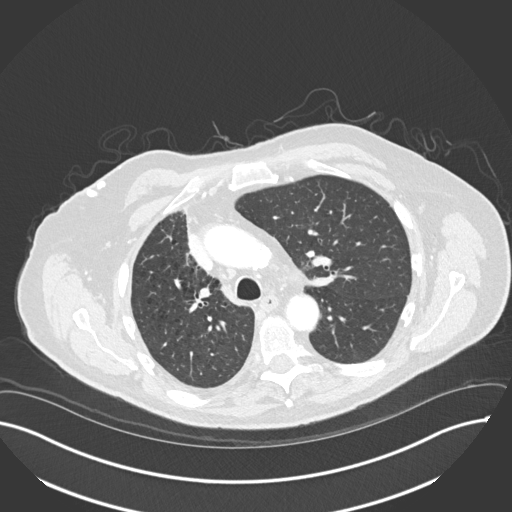
[im 112/162  mediastinal]
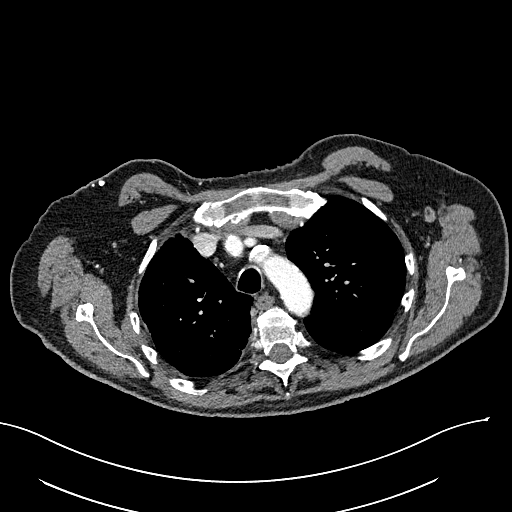
[im 112/162  lung]
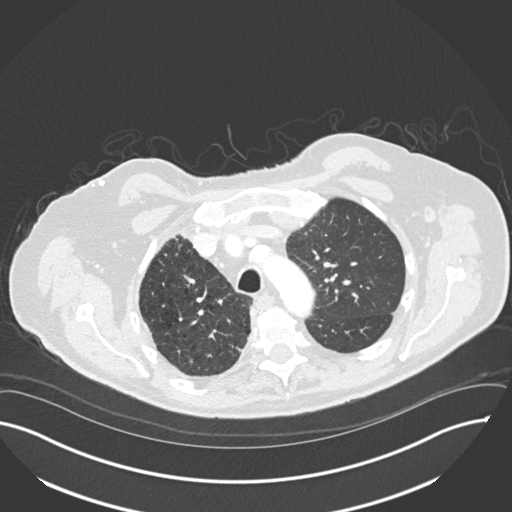
[im 124/162  lung]
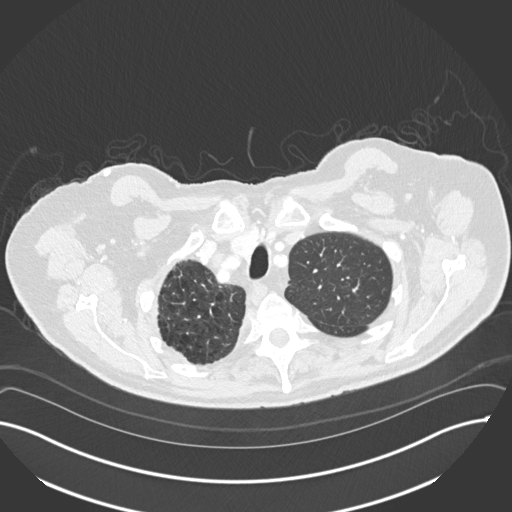
[im 137/162  lung]
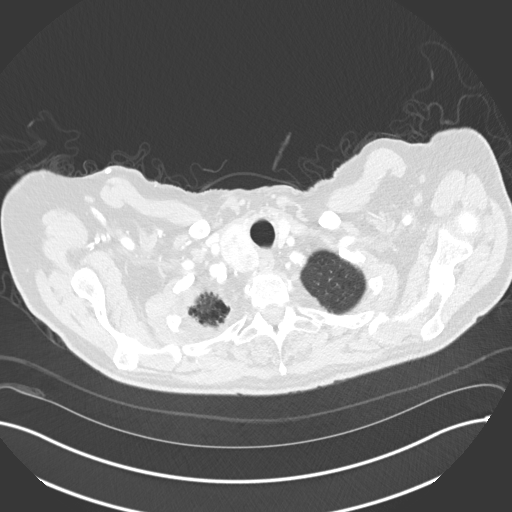
[im 149/162  lung]
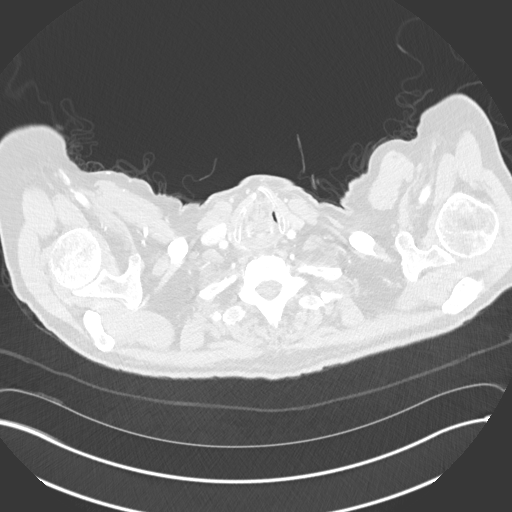

[Series 5: coronal · coronal · 0.63mm/px · 3 of 141 slices shown]
[im 29/141  lung]
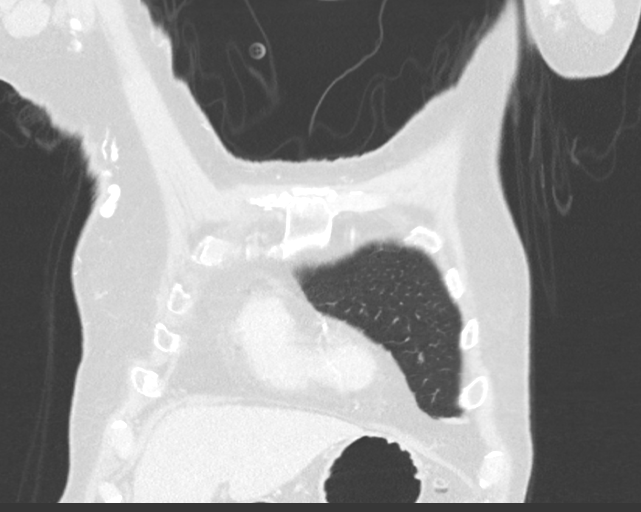
[im 57/141  lung]
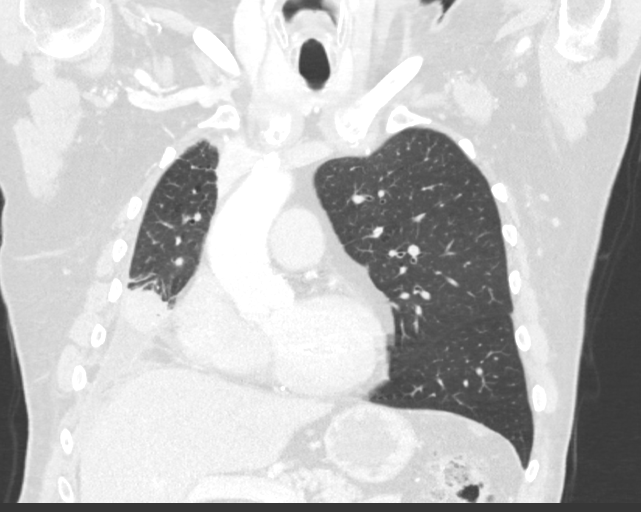
[im 85/141  lung]
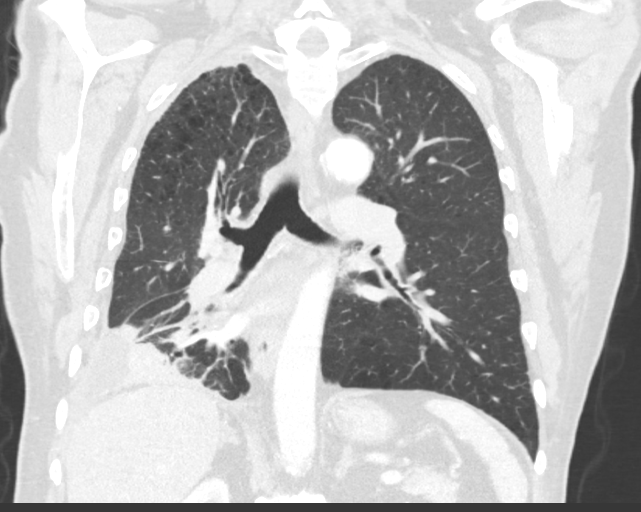

[15 of 36 positions shown; findings below may reference images not displayed]

FINDINGS: Cardiovascular: Cardiopericardial silhouette is at upper limits of
normal for size. Coronary artery calcification is evident.
Atherosclerotic calcification is noted in the wall of the thoracic
aorta. Prominence of the pulmonary outflow tract and main pulmonary
arteries raises concern for pulmonary arterial hypertension.

Mediastinum/Nodes: Low right paratracheal node measured previously
at 12 mm short axis is 11 mm short axis today. No new mediastinal
lymphadenopathy. No evidence for gross hilar lymphadenopathy
although assessment is limited by the lack of intravenous contrast
on today's study. The esophagus has normal imaging features. Stable
heterogeneous 4 cm right thyroid nodule. There is no axillary
lymphadenopathy.

Lungs/Pleura: Centrilobular emphsyema noted. Right upper lobe
spiculated pulmonary nodule measured previously at 1.5 x 1.2 cm now
measures 1.1 x 0.8 cm. Volume loss right hemithorax is similar to
prior. Focal areas of collapse/consolidative opacity in the anterior
and posterior right lung base are stable, likely scarring. Stable
chronic small right pleural fluid collection. No new suspicious
pulmonary nodule or mass.

Upper Abdomen: Stable thickening right adrenal gland. Gallbladder
not visualized on today's study although stones were seen
previously.

Musculoskeletal: No worrisome lytic or sclerotic osseous
abnormality.
IMPRESSION: 1. Interval decrease in the spiculated right upper lobe pulmonary
nodule.
2. Stable volume loss right hemithorax with areas of rounded
atelectasis in the anterior and posterior right lung base.
3. Stable chronic small right pleural fluid collection.
4. Prominence of the pulmonary outflow tract and main pulmonary
arteries raises concern for pulmonary arterial hypertension.
5. Stable 4 cm heterogeneous right thyroid nodule. Recommend thyroid
US (ref: [HOSPITAL]. [DATE]): 143-50).
6. Cholelithiasis.
7. Aortic Atherosclerosis (NM558-[id number]) and Emphysema ([id number]).

## 2021-03-08 ENCOUNTER — Telehealth: Payer: Self-pay | Admitting: Internal Medicine

## 2021-03-08 NOTE — Telephone Encounter (Signed)
Sch per 12/5 los, pt wife req r/s from originally sch appt on 12/21, moved to 12/27, pt wife aware

## 2021-03-10 ENCOUNTER — Other Ambulatory Visit: Payer: Self-pay | Admitting: Cardiology

## 2021-03-10 DIAGNOSIS — I2510 Atherosclerotic heart disease of native coronary artery without angina pectoris: Secondary | ICD-10-CM

## 2021-03-13 ENCOUNTER — Other Ambulatory Visit: Payer: Self-pay

## 2021-03-13 ENCOUNTER — Encounter (HOSPITAL_COMMUNITY)
Admission: RE | Admit: 2021-03-13 | Discharge: 2021-03-13 | Disposition: A | Discharge status: Home / Self Care | Payer: Medicare Other | Source: Ambulatory Visit | Attending: Internal Medicine | Admitting: Internal Medicine

## 2021-03-13 DIAGNOSIS — C3490 Malignant neoplasm of unspecified part of unspecified bronchus or lung: Secondary | ICD-10-CM | POA: Diagnosis not present

## 2021-03-13 DIAGNOSIS — I700 Atherosclerosis of aorta: Secondary | ICD-10-CM | POA: Diagnosis not present

## 2021-03-13 DIAGNOSIS — R599 Enlarged lymph nodes, unspecified: Secondary | ICD-10-CM | POA: Insufficient documentation

## 2021-03-13 DIAGNOSIS — J439 Emphysema, unspecified: Secondary | ICD-10-CM | POA: Diagnosis not present

## 2021-03-13 LAB — GLUCOSE, CAPILLARY: Glucose-Capillary: 103 mg/dL — ABNORMAL HIGH (ref 70–99)

## 2021-03-13 MED ORDER — FLUDEOXYGLUCOSE F - 18 (FDG) INJECTION
9.5000 | Freq: Once | INTRAVENOUS | Status: AC
  Administered 2021-03-13: 10:00:00 9.6 via INTRAVENOUS

## 2021-03-18 ENCOUNTER — Other Ambulatory Visit: Payer: Self-pay

## 2021-03-18 ENCOUNTER — Ambulatory Visit (INDEPENDENT_AMBULATORY_CARE_PROVIDER_SITE_OTHER): Payer: Medicare Other | Admitting: Family Medicine

## 2021-03-18 ENCOUNTER — Encounter: Payer: Self-pay | Admitting: Family Medicine

## 2021-03-18 VITALS — BP 106/61 | HR 40 | Ht 72.0 in | Wt 192.0 lb

## 2021-03-18 DIAGNOSIS — Z8739 Personal history of other diseases of the musculoskeletal system and connective tissue: Secondary | ICD-10-CM

## 2021-03-18 DIAGNOSIS — I255 Ischemic cardiomyopathy: Secondary | ICD-10-CM | POA: Diagnosis not present

## 2021-03-18 DIAGNOSIS — C3411 Malignant neoplasm of upper lobe, right bronchus or lung: Secondary | ICD-10-CM

## 2021-03-18 DIAGNOSIS — C44629 Squamous cell carcinoma of skin of left upper limb, including shoulder: Secondary | ICD-10-CM | POA: Insufficient documentation

## 2021-03-18 DIAGNOSIS — J432 Centrilobular emphysema: Secondary | ICD-10-CM

## 2021-03-18 DIAGNOSIS — E7800 Pure hypercholesterolemia, unspecified: Secondary | ICD-10-CM

## 2021-03-18 DIAGNOSIS — Z87898 Personal history of other specified conditions: Secondary | ICD-10-CM

## 2021-03-18 NOTE — Progress Notes (Signed)
Gerald [name] - 80 y.o. male MRN [id number]  Date of birth: 09-17-1940  Subjective Chief Complaint  Patient presents with   Follow-up    HPI Gerald [name] is an 80 year old male here today for follow-up visit.  Reports overall he is doing well.  He has a pressure ulceration on his buttocks that has been present for the past few months.  Has improved but has not fully resolved.  He does have some pain related to this intermittently.  Not been using over-the-counter lotion.  He does see cardiology for history of cardiomyopathy and bradycardia.  He denies any symptoms related to his bradycardia.  Tolerating Crestor well for management of hyperlipidemia.  No side effects noted with this.  He does continue to see pulmonology for management of COPD and lung cancer.  He is also seeing oncology.  Recently had PET scan.  ROS:  A comprehensive ROS was completed and negative except as noted per HPI  No Known Allergies  Past Medical History:  Diagnosis Date   Bursitis    Carotid artery disease (HCC)    COPD (chronic obstructive pulmonary disease) ([facility])    Hypercholesterolemia    Lung nodule    nscl ca dx'd 08/2019   Psoriasis    Tobacco abuse    Wears glasses    reading    Past Surgical History:  Procedure Laterality Date   BRONCHIAL BIOPSY  09/27/2019   Procedure: BRONCHIAL BIOPSIES;  Surgeon: Garner Nash, DO;  Location: [facility];  Service: Pulmonary;;   BRONCHIAL BRUSHINGS  09/27/2019   Procedure: BRONCHIAL BRUSHINGS;  Surgeon: Garner Nash, DO;  Location: [facility];  Service: Pulmonary;;   BRONCHIAL NEEDLE ASPIRATION BIOPSY  09/27/2019   Procedure: BRONCHIAL NEEDLE ASPIRATION BIOPSIES;  Surgeon: Garner Nash, DO;  Location: [facility];  Service: Pulmonary;;   BRONCHIAL WASHINGS  09/27/2019   Procedure: BRONCHIAL WASHINGS;  Surgeon: Garner Nash, DO;  Location: [facility];  Service: Pulmonary;;   COLONOSCOPY     CRYOTHERAPY  09/27/2019   Procedure: [place];   Surgeon: Garner Nash, DO;  Location: [facility];  Service: Pulmonary;;   FIDUCIAL MARKER PLACEMENT  09/27/2019   Procedure: FIDUCIAL MARKER PLACEMENT;  Surgeon: Garner Nash, DO;  Location: [facility];  Service: Pulmonary;;   HEMOSTASIS CONTROL  09/27/2019   Procedure: HEMOSTASIS CONTROL;  Surgeon: Garner Nash, DO;  Location: [facility];  Service: Pulmonary;;   HERNIA REPAIR     RIGHT/LEFT HEART CATH AND CORONARY ANGIOGRAPHY N/A 02/09/2020   Procedure: RIGHT/LEFT HEART CATH AND CORONARY ANGIOGRAPHY;  Surgeon: Burnell Blanks, MD;  Location: [facility];  Service: Cardiovascular;  Laterality: N/A;   VIDEO BRONCHOSCOPY WITH ENDOBRONCHIAL NAVIGATION N/A 09/27/2019   Procedure: VIDEO BRONCHOSCOPY WITH ENDOBRONCHIAL NAVIGATION;  Surgeon: Garner Nash, DO;  Location: [facility];  Service: Pulmonary;  Laterality: N/A;    Social History   Socioeconomic History   Marital status: Married    Spouse name: Not on file   Number of children: Not on file   Years of education: Not on file   Highest education level: Not on file  Occupational History   Not on file  Tobacco Use   Smoking status: Every Day    Packs/day: 2.00    Types: Cigarettes   Smokeless tobacco: Never  Vaping Use   Vaping Use: Never used  Substance and Sexual Activity   Alcohol use: Yes    Comment: social   Drug use: Never   Sexual activity:  Not Currently  Other Topics Concern   Not on file  Social History Narrative   Not on file   Social Determinants of Health   Financial Resource Strain: Not on file  Food Insecurity: Not on file  Transportation Needs: Not on file  Physical Activity: Not on file  Stress: Not on file  Social Connections: Not on file    Family History  Problem Relation Age of Onset   Heart attack Father    Mesothelioma Sister 8   Lung cancer Brother     Health Maintenance  Topic Date Due   TETANUS/TDAP  Never done   Zoster Vaccines- Shingrix (1 of 2)  Never done   Pneumonia Vaccine 4+ Years old (2 - PPSV23 if available, else PCV20) 03/06/2016   COVID-19 Vaccine (3 - Moderna risk series) 06/22/2019   INFLUENZA VACCINE  Completed   HPV VACCINES  Aged Out     ----------------------------------------------------------------------------------------------------------------------------------------------------------------------------------------------------------------- Physical Exam BP 106/61 (BP Location: Left Arm, Patient Position: Sitting, Cuff Size: Normal)    Pulse (!) 40    Ht 6' (1.829 m)    Wt 192 lb (87.1 kg)    SpO2 96%    BMI 26.04 kg/m   Physical Exam Constitutional:      Appearance: Normal appearance.  Eyes:     General: No scleral icterus. Cardiovascular:     Rate and Rhythm: Normal rate and regular rhythm.  Pulmonary:     Effort: Pulmonary effort is normal.     Breath sounds: Normal breath sounds.  Musculoskeletal:     Cervical back: Neck supple.  Skin:    Comments: Ulceration of buttocks along gluteal cleft.  This is limited to breakdown of the dermal layer.  Neurological:     Mental Status: He is alert.  Psychiatric:        Mood and Affect: Mood normal.        Behavior: Behavior normal.    ------------------------------------------------------------------------------------------------------------------------------------------------------------------------------------------------------------------- Assessment and Plan  Centrilobular emphysema ([facility]) Followed by pulmonology.  Stable at this time.  Pure hypercholesterolemia Tolerating Crestor well.  Update lipid panel.  Continue at current strength.  History of bradycardia Denies symptoms related to this.  He will continue to see cardiology.  Primary cancer of right upper lobe of lung ([facility]) Recent PET scan shows new recurrent lesions with likely metastases.   No orders of the defined types were placed in this encounter.   Return in about 6 weeks (around  04/29/2021) for f/u ulcer.    This visit occurred during the SARS-CoV-2 public health emergency.  Safety protocols were in place, including screening questions prior to the visit, additional usage of staff PPE, and extensive cleaning of exam room while observing appropriate contact time as indicated for disinfecting solutions.

## 2021-03-18 NOTE — Assessment & Plan Note (Signed)
Followed by pulmonology.  Stable at this time.

## 2021-03-18 NOTE — Patient Instructions (Addendum)
Try Aquaphor or zinc oxide cream to area.  Try sacral foam to help pad area.   Return in 6 weeks to recheck or sooner if worsening.

## 2021-03-18 NOTE — Assessment & Plan Note (Signed)
Recent PET scan shows new recurrent lesions with likely metastases.

## 2021-03-18 NOTE — Assessment & Plan Note (Signed)
Tolerating Crestor well.  Update lipid panel.  Continue at current strength.

## 2021-03-18 NOTE — Assessment & Plan Note (Signed)
Denies symptoms related to this.  He will continue to see cardiology.

## 2021-03-19 LAB — LIPID PANEL W/REFLEX DIRECT LDL
Cholesterol: 128 mg/dL (ref <200)
HDL: 41 mg/dL (ref >=40)
LDL Cholesterol (Calc): 61 mg/dL (calc)
Non-HDL Cholesterol (Calc): 87 mg/dL (calc) (ref <130)
Total CHOL/HDL Ratio: 3.1 (calc) (ref <5.0)
Triglycerides: 179 mg/dL — ABNORMAL HIGH (ref <150)

## 2021-03-19 LAB — URIC ACID: Uric Acid, Serum: 5.9 mg/dL (ref 4.0–8.0)

## 2021-03-20 ENCOUNTER — Ambulatory Visit: Payer: Medicare Other | Admitting: Internal Medicine

## 2021-03-26 ENCOUNTER — Other Ambulatory Visit: Payer: Self-pay

## 2021-03-26 ENCOUNTER — Encounter: Payer: Self-pay | Admitting: Internal Medicine

## 2021-03-26 ENCOUNTER — Inpatient Hospital Stay (HOSPITAL_BASED_OUTPATIENT_CLINIC_OR_DEPARTMENT_OTHER): Payer: Medicare Other | Admitting: Internal Medicine

## 2021-03-26 ENCOUNTER — Telehealth: Payer: Self-pay | Admitting: Pulmonary Disease

## 2021-03-26 ENCOUNTER — Encounter: Payer: Self-pay | Admitting: *Deleted

## 2021-03-26 VITALS — BP 119/67 | HR 48 | Temp 97.3°F | Resp 16 | Wt 193.5 lb

## 2021-03-26 DIAGNOSIS — C3490 Malignant neoplasm of unspecified part of unspecified bronchus or lung: Secondary | ICD-10-CM

## 2021-03-26 DIAGNOSIS — C3411 Malignant neoplasm of upper lobe, right bronchus or lung: Secondary | ICD-10-CM

## 2021-03-26 DIAGNOSIS — C3431 Malignant neoplasm of lower lobe, right bronchus or lung: Secondary | ICD-10-CM | POA: Diagnosis not present

## 2021-03-26 DIAGNOSIS — R599 Enlarged lymph nodes, unspecified: Secondary | ICD-10-CM

## 2021-03-26 DIAGNOSIS — Z85118 Personal history of other malignant neoplasm of bronchus and lung: Secondary | ICD-10-CM | POA: Diagnosis not present

## 2021-03-26 DIAGNOSIS — R918 Other nonspecific abnormal finding of lung field: Secondary | ICD-10-CM

## 2021-03-26 NOTE — Telephone Encounter (Signed)
Pt has been schedule for [place] 03/29/21 @ 1pm  @ [facility] he will go for covid test tomorrow  Tried to call pt to give appt info patient was on the  [place] will call pt back in few mins

## 2021-03-26 NOTE — Progress Notes (Signed)
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  The patient has evidence for disease recurrence and metastasis in December 2022.  PRIOR THERAPY: SBRT to the right upper lobe pulmonary nodule as well as the right lower lobe pulmonary nodule under the care of Dr. Sondra Come completed on 11/14/2019.  CURRENT THERAPY: Observation.  INTERVAL HISTORY: Gerald [name] 80 y.o. male returns to the clinic today for follow-up visit accompanied by his wife for follow-up visit.  He is feeling fine today with no concerning complaints except for mild shortness of breath with exertion.  He denied having any current chest pain, cough or hemoptysis.  He denied having any fever or chills.  He has no nausea, vomiting, diarrhea or constipation.  He has no headache or visual changes.  He denied having any significant weight loss or night sweats.  He was found on previous CT scan of the chest to have evidence for disease recurrence in the right side of the chest.  I ordered a PET scan which was performed recently and the patient is here today for evaluation and discussion of his scan results.    MEDICAL HISTORY: Past Medical History:  Diagnosis Date   Bursitis    Carotid artery disease (HCC)    COPD (chronic obstructive pulmonary disease) (HCC)    Hypercholesterolemia    Lung nodule    nscl ca dx'd 08/2019   Psoriasis    Tobacco abuse    Wears glasses    reading    ALLERGIES:  has No Known Allergies.  MEDICATIONS:  Current Outpatient Medications  Medication Sig Dispense Refill    acitretin (SORIATANE) 25 MG capsule Take 25 mg by mouth daily.     albuterol (VENTOLIN HFA) 108 (90 Base) MCG/ACT inhaler Inhale 2 puffs into the lungs every 4 (four) hours as needed for wheezing.     aspirin EC 81 MG tablet Take 1 tablet (81 mg total) by mouth daily. Swallow whole. 90 tablet 3   clobetasol cream (TEMOVATE) 6.76 % Apply 1 application topically daily as needed (Psoriasis).     colchicine 0.6 MG tablet Take 1.2mg  initially followed by 0.6mg  in 1 hour.  Continue 0.6mg  daily until improvement. 20 tablet 1   Multiple Vitamins-Minerals (ICAPS) TABS Take 1 tablet by mouth in the morning and at bedtime. IREDS     niacinamide 500 MG tablet Take 500 mg by mouth daily with breakfast.     Omega-3 Fatty Acids (FISH OIL) 1000 MG CAPS Take 1,000 mg by mouth daily.      rosuvastatin (CRESTOR) 20 MG tablet Take 1 tablet by mouth once daily 90 tablet 3   triamcinolone cream (KENALOG) 0.1 % 1 application     vitamin B-12 (CYANOCOBALAMIN) 500 MCG tablet Take 500 mcg by mouth daily.     No current facility-administered medications for this visit.    SURGICAL HISTORY:  Past Surgical History:  Procedure Laterality Date   BRONCHIAL BIOPSY  09/27/2019   Procedure: BRONCHIAL BIOPSIES;  Surgeon: Garner Nash, DO;  Location: [facility];  Service: Pulmonary;;   BRONCHIAL BRUSHINGS  09/27/2019  Procedure: BRONCHIAL BRUSHINGS;  Surgeon: Garner Nash, DO;  Location: [facility];  Service: Pulmonary;;   BRONCHIAL NEEDLE ASPIRATION BIOPSY  09/27/2019   Procedure: BRONCHIAL NEEDLE ASPIRATION BIOPSIES;  Surgeon: Garner Nash, DO;  Location: [facility];  Service: Pulmonary;;   BRONCHIAL WASHINGS  09/27/2019   Procedure: BRONCHIAL WASHINGS;  Surgeon: Garner Nash, DO;  Location: [facility];  Service: Pulmonary;;   COLONOSCOPY     CRYOTHERAPY  09/27/2019   Procedure: Cydney Ok;  Surgeon: Garner Nash, DO;  Location: [facility];  Service: Pulmonary;;   FIDUCIAL MARKER PLACEMENT   09/27/2019   Procedure: FIDUCIAL MARKER PLACEMENT;  Surgeon: Garner Nash, DO;  Location: [facility];  Service: Pulmonary;;   HEMOSTASIS CONTROL  09/27/2019   Procedure: HEMOSTASIS CONTROL;  Surgeon: Garner Nash, DO;  Location: [facility];  Service: Pulmonary;;   HERNIA REPAIR     RIGHT/LEFT HEART CATH AND CORONARY ANGIOGRAPHY N/A 02/09/2020   Procedure: RIGHT/LEFT HEART CATH AND CORONARY ANGIOGRAPHY;  Surgeon: Burnell Blanks, MD;  Location: [facility];  Service: Cardiovascular;  Laterality: N/A;   VIDEO BRONCHOSCOPY WITH ENDOBRONCHIAL NAVIGATION N/A 09/27/2019   Procedure: VIDEO BRONCHOSCOPY WITH ENDOBRONCHIAL NAVIGATION;  Surgeon: Garner Nash, DO;  Location: [facility];  Service: Pulmonary;  Laterality: N/A;    REVIEW OF SYSTEMS:  Constitutional: positive for fatigue Eyes: negative Ears, nose, mouth, throat, and face: negative Respiratory: positive for dyspnea on exertion Cardiovascular: negative Gastrointestinal: negative Genitourinary:negative Integument/breast: negative Hematologic/lymphatic: negative Musculoskeletal:negative Neurological: negative Behavioral/Psych: negative Endocrine: negative Allergic/Immunologic: negative   PHYSICAL EXAMINATION: General appearance: alert, cooperative, fatigued, and no distress Head: Normocephalic, without obvious abnormality, atraumatic Neck: no adenopathy, no JVD, supple, symmetrical, trachea midline, and thyroid not enlarged, symmetric, no tenderness/mass/nodules Lymph nodes: Cervical, supraclavicular, and axillary nodes normal. Resp: clear to auscultation bilaterally Back: symmetric, no curvature. ROM normal. No CVA tenderness. Cardio: regular rate and rhythm, S1, S2 normal, no murmur, click, rub or gallop GI: soft, non-tender; bowel sounds normal; no masses,  no organomegaly Extremities: extremities normal, atraumatic, no cyanosis or edema Neurologic: Alert and oriented X 3, normal strength and tone.  Normal symmetric reflexes. Normal coordination and gait  ECOG PERFORMANCE STATUS: 1 - Symptomatic but completely ambulatory  Blood pressure 119/67, pulse (!) 48, temperature (!) 97.3 F (36.3 C), temperature source Tympanic, resp. rate 16, weight 193 lb 8 oz (87.8 kg), SpO2 95 %.  LABORATORY DATA: Lab Results  Component Value Date   WBC 7.1 02/28/2021   HGB 14.9 02/28/2021   HCT 44.9 02/28/2021   MCV 94.5 02/28/2021   PLT 173 02/28/2021      Chemistry      Component Value Date/Time   NA 141 02/28/2021 1055   NA 139 02/07/2020 1208   K 4.7 02/28/2021 1055   CL 105 02/28/2021 1055   CO2 28 02/28/2021 1055   BUN 16 02/28/2021 1055   BUN 17 02/07/2020 1208   CREATININE 0.94 02/28/2021 1055      Component Value Date/Time   CALCIUM 9.0 02/28/2021 1055   ALKPHOS 71 02/28/2021 1055   AST 18 02/28/2021 1055   ALT 16 02/28/2021 1055   BILITOT 0.4 02/28/2021 1055       RADIOGRAPHIC STUDIES: CT Chest W Contrast  Result Date: 03/01/2021 CLINICAL DATA:  Primary Cancer Type: Lung Imaging Indication: Routine surveillance Interval therapy since last imaging? No Initial Cancer Diagnosis Date: 09/27/2019; Established by: Biopsy-proven Detailed Pathology: Stage Ia non-small cell lung cancer, squamous cell carcinoma. Primary Tumor  location: Right upper lobe and right lower lobe. Surgeries: Endobronchial cryotherapy 09/27/2019. Chemotherapy: No Immunotherapy? No Radiation therapy? Yes; Date Range: 11/01/2019 - 11/14/2019; Target: Right lung EXAM: CT CHEST WITH CONTRAST TECHNIQUE: Multidetector CT imaging of the chest was performed during intravenous contrast administration. CONTRAST:  46mL OMNIPAQUE IOHEXOL 350 MG/ML SOLN COMPARISON:  Most recent CT chest 08/28/2020.  09/12/2019 PET-CT. FINDINGS: Cardiovascular: Heart size is normal. There is no significant pericardial fluid, thickening or pericardial calcification. There is aortic atherosclerosis, as well as atherosclerosis of the great vessels  of the mediastinum and the coronary arteries, including calcified atherosclerotic plaque in the left main, left anterior descending, left circumflex and right coronary arteries. Mediastinum/Nodes: Enlarged right paratracheal lymph node measuring 3.5 cm in short axis. No definite hilar lymphadenopathy. Esophagus is unremarkable in appearance. No axillary lymphadenopathy. Lungs/Pleura: Small chronic right-sided pleural effusion with chronic right-sided pleural thickening and enhancement, and associated areas of chronic rounded atelectasis in both the right middle and lower lobes. In the lateral aspect of the right upper lobe there is a fiducial marker surrounded by some nodular architectural distortion, likely to represent chronic postradiation fibrosis at the site of the prior treated neoplasm. In the anterior aspect of the right upper lobe (axial image 72 of series 2 and coronal image 46 of series 6) there is a new mass-like area which measures approximately 2.4 x 3.9 x 2.3 cm which appears to directly invade the mediastinum, highly concerning for neoplasm. No left-sided suspicious appearing pulmonary nodules or masses are noted. No consolidative airspace disease. No left pleural effusion. Upper Abdomen: Aortic atherosclerosis. Colonic diverticulosis in the region of the transverse colon. Musculoskeletal: There are no aggressive appearing lytic or blastic lesions noted in the visualized portions of the skeleton. IMPRESSION: 1. New mass-like area in the medial aspect of the right upper lobe anteriorly measuring 2.4 x 3.9 x 2.3 cm, with apparent invasion of the mediastinal fat, and bulky right paratracheal lymphadenopathy, highly concerning for neoplasm. This is geographically separate from the previously treated right upper lobe nodule which was located more laterally, such that this may represent a new neoplasm. Further evaluation with PET-CT is suggested to better evaluate these findings and assess for the  possibility of distal metastatic disease. 2. Small chronic right-sided pleural effusion with areas of rounded atelectasis in the right middle and lower lobes, similar to the prior examination. 3. Aortic atherosclerosis, in addition to left main and 3 vessel coronary artery disease. 4. Colonic diverticulosis. These results will be called to the ordering clinician or representative by the Radiologist Assistant, and communication documented in the PACS or [employer]. Electronically Signed   By: Vinnie Langton M.D.   On: 03/01/2021 10:59   NM PET Image Restage (PS) Skull Base to Thigh (F-18 FDG)  Result Date: 03/14/2021 CLINICAL DATA:  Follow-up treatment strategy for non-small cell lung cancer. EXAM: NUCLEAR MEDICINE PET SKULL BASE TO THIGH TECHNIQUE: 9.6 mCi F-18 FDG was injected intravenously. Full-ring PET imaging was performed from the skull base to thigh after the radiotracer. CT data was obtained and used for attenuation correction and anatomic localization. Fasting blood glucose: 103 mg/dl COMPARISON:  PET-CT dated September 12, 2019; chest CT dated February 29, 2020 FINDINGS: Mediastinal blood pool activity: SUV max 2.0 Liver activity: SUV max 3.5 NECK: No hypermetabolic lymph nodes in the neck. Incidental CT findings: Unchanged enlarged and heterogeneous right thyroid lobe. CHEST: Hypermetabolic irregular mass of the anterior right upper lobe which measures approximately 3.8 by 1.6 cm and invades into  the mediastinal fat with SUV max of 16.8. Small satellite nodules are seen. For example, hypermetabolic nodule measuring 6 mm is seen on series 4, image 88 located more inferiorly in the right cardiophrenic fat with SUV max of 3.6. Hypermetabolic right lower paratracheal lymph node with an SUV max of 18. Stable linear opacities of the right upper lobe which likely represent post treatment changes small right pleural effusion with adjacent atelectasis, unchanged when compared with prior exam. Incidental CT  findings: Centrilobular emphysema. Three-vessel coronary artery calcifications. Atherosclerotic disease of the thoracic aorta. ABDOMEN/PELVIS: Focal hypermetabolic uptake in the inferior right lobe of the liver with SUV max of 9.8 which correlates with a subtle low-attenuation lesion measuring 9 mm on CT series 4, image 147. No abnormal hypermetabolic activity within the pancreas, adrenal glands, or spleen. No hypermetabolic lymph nodes in the abdomen or pelvis. Incidental CT findings: Cholelithiasis. Extensive diverticulosis. Small fat containing right inguinal hernia atherosclerotic disease of the thoracic aorta. SKELETON: No focal hypermetabolic activity to suggest skeletal metastasis. Incidental CT findings: none IMPRESSION: 1. Hypermetabolic masslike soft tissue of the right upper lobe with small satellite nodules which invades into the anterior mediastinal fat. Findings are concerning for new or recurrent disease. 2. Enlarged hypermetabolic right lower paratracheal lymph node, compatible with metastatic disease. 3. New hypermetabolic lesion of the inferior right lobe of the liver, concerning for metastatic disease. 4. Aortic Atherosclerosis (ICD10-I70.0) and Emphysema (ICD10-J43.9). Electronically Signed   By: Yetta Glassman M.D.   On: 03/14/2021 16:00     ASSESSMENT AND PLAN: This is a very pleasant 80 years old white male recently diagnosed with synchronous stage IA non-small cell lung cancer, squamous cell carcinoma involving the right upper lobe pulmonary nodule in addition to right lower lobe endobronchial lesion diagnosed in June 2021 status post SBRT to these lesions under the care of Dr. Sondra Come. The patient had repeat CT scan of the chest performed recently.  I personally and independently reviewed the scan images and discussed the result and showed the images to the patient today. His scan showed some concerning findings for disease recurrence with new masslike area in the medial aspect of the  right upper lobe with apparent invasion of the mediastinal fat and bulky right paratracheal lymphadenopathy highly concerning for neoplasm. I ordered a PET scan which was performed on March 13, 2021 and that showed hypermetabolic soft tissue of the right upper lobe with a small satellite nodules which invades into the anterior mediastinal fat concerning for new or recurrent disease.  There was also enlarged hypermetabolic right lower paratracheal lymph node and new hypermetabolic lesion of the inferior right lobe of the liver concerning for metastatic disease. I personally and independently reviewed the scan images and discussed the result and showed the images to the patient and his wife. I recommended for him to complete the staging work-up by ordering MRI of the brain to rule out brain metastasis. I also referred the patient to Dr. Valeta Harms for consideration of bronchoscopy with EBUS for tissue diagnosis. I will see the patient back for follow-up visit in around 3 weeks for discussion of his treatment options based on the biopsy and the staging work-up. The patient was advised to call immediately if he has any other concerning symptoms in the interval. The patient is currently on observation and he is feeling fine today with no concerning complaints except for shortness of breath with exertion. The patient voices understanding of current disease status and treatment options and is in agreement with  the current care plan. The total time spent in the appointment was 35 minutes.  All questions were answered. The patient knows to call the clinic with any problems, questions or concerns. We can certainly see the patient much sooner if necessary.   Disclaimer: This note was dictated with voice recognition software. Similar sounding words can inadvertently be transcribed and may not be corrected upon review.

## 2021-03-26 NOTE — Telephone Encounter (Signed)
PCCM:   I called and spoke with Almyra Free patients daughter.   We will get him scheduled for bronchosocpy EBUS 03/29/2021  I have spoke with endo and we will be ok for 1 PM start time   Garner Nash, DO [facility] 03/26/2021 3:52 PM

## 2021-03-26 NOTE — Progress Notes (Signed)
Oncology Nurse Navigator Documentation  Oncology Nurse Navigator Flowsheets 03/26/2021  Abnormal Finding Date 03/01/2021  Navigator Follow Up Date: 03/28/2021  Navigator Follow Up Reason: Appointment Review  Navigator Location [facility]  Navigator Encounter Type Clinic/MDC  Patient Visit Type MedOnc/I met Mr. Ruppert and his wife today.  Patient recently had abnormal imaging and needs referral to pulmonary for tissue dx. He is an established patient of Dr. Valeta Harms. I contacted Dr. Valeta Harms with an update of referral.   Barriers/Navigation Needs Coordination of Care  Interventions Coordination of Care  Acuity Level 2-Minimal Needs (1-2 Barriers Identified)  Coordination of Care Other  Time Spent with Patient 30

## 2021-03-26 NOTE — Telephone Encounter (Signed)
Spoke to patient's wife  And give her appt info

## 2021-03-27 ENCOUNTER — Encounter: Payer: Self-pay | Admitting: *Deleted

## 2021-03-27 ENCOUNTER — Other Ambulatory Visit: Payer: Self-pay | Admitting: Pulmonary Disease

## 2021-03-27 ENCOUNTER — Telehealth: Payer: Self-pay | Admitting: Pulmonary Disease

## 2021-03-27 LAB — SARS CORONAVIRUS 2 (TAT 6-24 HRS): SARS Coronavirus 2: NEGATIVE

## 2021-03-27 NOTE — Telephone Encounter (Signed)
Called and spoke with Gerald [name] and provided the procedure code for [place] ([address]).  Nothing further needed.

## 2021-03-27 NOTE — Progress Notes (Signed)
Patient wife called me back. I updated on her husbands appt for his mri brain. She verbalized understanding.

## 2021-03-27 NOTE — Telephone Encounter (Signed)
Dr. Valeta Harms,  Pre admitting would like the order for the bronchoscopy for this patient updated with the procedure code.  Could you take care of this please?  Thank you.

## 2021-03-27 NOTE — Progress Notes (Signed)
Oncology Nurse Navigator Documentation  Oncology Nurse Navigator Flowsheets 03/27/2021 03/26/2021  Abnormal Finding Date - 03/01/2021  Navigator Follow Up Date: 04/04/2021 03/28/2021  Navigator Follow Up Reason: Pathology Appointment Review  Navigator [facility]  Navigator Encounter Type Appt/Treatment Plan Review Clinic/MDC  Patient Visit Type Other MedOnc  Treatment Phase Abnormal Scans -  Barriers/Navigation Needs Coordination of Care;Education/I followed up on Gerald [name]'s insurance auth for his mri brain. This has been completed. I called radiology to schedule. I then called patient and left vm message with mri appt information. I also asked that they call cancer center if they had any questions.  Coordination of Care  Education Other -  Interventions Coordination of Care;Education Coordination of Care  Acuity Level 3-Moderate Needs (3-4 Barriers Identified) Level 2-Minimal Needs (1-2 Barriers Identified)  Coordination of Care Appts Other  Education Method Verbal -  Time Spent with Patient 45 30

## 2021-03-27 NOTE — Telephone Encounter (Signed)
I left a brief message for the pre service center to call back.

## 2021-03-28 ENCOUNTER — Encounter (HOSPITAL_COMMUNITY): Payer: Self-pay | Admitting: Pulmonary Disease

## 2021-03-28 ENCOUNTER — Other Ambulatory Visit: Payer: Self-pay

## 2021-03-28 NOTE — Telephone Encounter (Signed)
Lm for pre service center.

## 2021-03-28 NOTE — Anesthesia Preprocedure Evaluation (Addendum)
Anesthesia Evaluation  Patient identified by MRN, date of birth, ID band Patient awake    Reviewed: Allergy & Precautions, NPO status , Patient's Chart, lab work & pertinent test results  Airway Mallampati: III  TM Distance: >3 FB Neck ROM: Full    Dental  (+) Caps, Dental Advisory Given   Pulmonary COPD, Current Smoker,     + decreased breath sounds      Cardiovascular + CAD   Rhythm:Regular Rate:Normal     Neuro/Psych negative neurological ROS  negative psych ROS   GI/Hepatic negative GI ROS, Neg liver ROS,   Endo/Other  negative endocrine ROS  Renal/GU negative Renal ROS     Musculoskeletal negative musculoskeletal ROS (+)   Abdominal Normal abdominal exam  (+)   Peds  Hematology negative hematology ROS (+)   Anesthesia Other Findings   Reproductive/Obstetrics                           Anesthesia Physical Anesthesia Plan  ASA: 3  Anesthesia Plan: General   Post-op Pain Management:    Induction: Intravenous  PONV Risk Score and Plan: Ondansetron  Airway Management Planned: Oral ETT  Additional Equipment: None  Intra-op Plan:   Post-operative Plan: Extubation in OR  Informed Consent: I have reviewed the patients History and Physical, chart, labs and discussed the procedure including the risks, benefits and alternatives for the proposed anesthesia with the patient or authorized representative who has indicated his/her understanding and acceptance.     Dental advisory given  Plan Discussed with: CRNA  Anesthesia Plan Comments: (PAT note written 03/28/2021 by Myra Gianotti, PA-C. )      Anesthesia Quick Evaluation

## 2021-03-28 NOTE — Progress Notes (Signed)
Anesthesia Chart Review:  Case: [id number] Date/Time: 03/29/21 1300   Procedure: VIDEO BRONCHOSCOPY WITH ENDOBRONCHIAL ULTRASOUND   Anesthesia type: General   Pre-op diagnosis: adenopathy   Location: [facility] ROOM [facility] / [place] [facility]   Surgeons: Garner Nash, DO       DISCUSSION:  Patient is an 80 year old male scheduled for the above procedure. S/p video bronchoscopy/biopsies 09/27/19 (RUL, RLL + squamous cell carcinoma). Per 03/26/21 oncology note by Dr. 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  The patient has evidence for disease recurrence and metastasis in December 2022." He referred patient for MRI of the brain to rule out brain metastasis and to Dr. Valeta Harms for consideration of bronchoscopy with EBUS for tissue diagnosis.    History includes smoking, lung cancer (s/p SBRT RUL/RLL nodules, completed 11/14/19; imaging concerning for metastasis 02/2021), COPD, CAD, ischemic cardiomyopathy, bradycardia (avoid AV nodal agents), hypercholesterolemia, psoriasis, carotid artery stenosis (stable 67-61% LICA stenosis, < 95% RICA 10/25/18).   Last cardiology visit with Dr. Harrell Gave 12/06/20. Asymptomatic from CAD/CM standpoint. No high degree AV block, avoid AV nodal agents given bradycardia. Smoking cessation encouraged. Six month follow-up planned.   Last CBC and CMP 02/28/21. 03/27/21 presurgical COVID-19 test negative. Anesthesia team to evaluate on the day of surgery.    VS:  BP Readings from Last 3 Encounters:  03/26/21 119/67  03/18/21 106/61  03/04/21 (!) 105/58   Pulse Readings from Last 3 Encounters:  03/26/21 (!) 48  03/18/21 (!) 40  03/04/21 83     PROVIDERS: [name], DO is PCP. Last visit 03/18/21.  Previously saw Mosetta Anis, MD is [facility]. Curt Bears, MD is HEM-ONC Gery Pray, MD is RAD-ONC Buford Dresser, MD is cardiologist   LABS: Most recent lab results include: Lab Results  Component Value Date   WBC 7.1 02/28/2021   HGB 14.9 02/28/2021   HCT 44.9 02/28/2021   PLT 173 02/28/2021   GLUCOSE 88 02/28/2021   ALT 16 02/28/2021   AST 18 02/28/2021   NA 141 02/28/2021   K 4.7 02/28/2021   CL 105 02/28/2021   CREATININE 0.94 02/28/2021   BUN 16 02/28/2021   CO2 28 02/28/2021     IMAGES: PET Scan 03/13/21: IMPRESSION: 1. Hypermetabolic masslike soft tissue of the right upper lobe with small satellite nodules which invades into the anterior mediastinal fat. Findings are concerning for new or recurrent disease. 2. Enlarged hypermetabolic right lower paratracheal lymph node, compatible with metastatic disease. 3. New hypermetabolic lesion of the inferior right lobe of the liver, concerning for metastatic disease. 4. Aortic Atherosclerosis (ICD10-I70.0) and Emphysema (ICD10-J43.9).  CT Chest 02/28/21: IMPRESSION: 1. New mass-like area in the medial aspect of the right upper lobe anteriorly measuring 2.4 x 3.9 x 2.3 cm, with apparent invasion of the mediastinal fat, and bulky right paratracheal lymphadenopathy, highly concerning for neoplasm. This is geographically separate from the previously treated right upper lobe nodule which was located more laterally, such that this may represent a new neoplasm. Further evaluation with PET-CT is suggested to better evaluate these findings and assess for the possibility of distal metastatic disease. 2. Small chronic right-sided pleural effusion with areas of rounded atelectasis in the right middle and lower lobes, similar to the prior examination. 3. Aortic atherosclerosis, in addition  to left main and 3 vessel coronary artery disease. 4. Colonic diverticulosis.   EKG: 12/06/20: SR with marked sinus arrhythmias. First  degree AV block (PR 226 ms).   CV: Cardiac cath 02/09/20: Mid Cx to Dist Cx lesion is 100% stenosed. 2nd Mrg lesion is 99% stenosed. Ost LM to Dist LM lesion is 30% stenosed. Prox LAD to Mid LAD lesion is 30% stenosed. Ost Cx to Mid Cx lesion is 30% stenosed. Prox RCA lesion is 99% stenosed.   1. The LAD is a large caliber vessel that courses to the apex. Diffuse mild calcified disease without any focally obstructive lesions.  2. The Circumflex is a large dominant vessel. There is heavy calcification in the proximal and mid vessel. The mid Circumflex is chronically occluded just beyond a small caliber obtuse marginal branch. The left sided PDA fills from left to left collaterals.  3. The small non-dominant RCA has severe diffuse proximal and mid vessel disease. This non-dominant vessel is too small for PCI.  4. Normal right and left heart pressures   Recommendations: Medical management of CAD. His mid Circumflex is chronically occluded and fills briskly from left to left collaterals. The RCA is a small non-dominant vessel with severe diffuse proximal and mid disease. Could consider viability study to assess the lateral wall and if viable myocardium, review his films with the CTO team to consider PCI of the Circumflex. He seems to be asymptomatic at this time so the best approach may be medical management of his CAD without an attempt at PCI.    Echo 01/11/20 [facility]; scanned under Media tab, 02/14/20): Results as outlined by Dr. Harrell Gave, "Echo dated 01/11/20 has an incomplete report in the scanned records. Per Care Everywhere, summary was a technically difficult study, no effusion, globally decreased EF (reported at 40%), no significant valve disease."   Cardiac event monitor 11/30/19-12/06/19 [facility]):  Patient monitored for 6 days 5 hours 19 minutes Highest HR 133 bpm, Low RH 50 bpm, Average HR 78 bpm 16 events were transmitted.  1 symptomatic, 15 device triggered. VT  occurred 1 time with fastest run 126 bpm 1st Degree Heart block occurred 5 times the most slowest was 63 bpm 199,774 PACs with PAC burden of 37% 2,865 PVCs with PVC burden of less than 1%   Carotid US 10/25/18 ([facility]): Previous: Previous exam performed on 10/15/2017 demonstrated a 60-79% left ICA stenosis with maximum velocities of 193.80 cm/s PSV/58.93 cm/s EDV.  Right: No significant focal increase in ICA velocities.  Left: Moderate focal increase in ICA velocities with post stenotic turbulence.  Conclusions: RIGHT:  No hemodynamically significant ICA stenosis, consistent with <60%.  LEFT: Moderate ICA stenosis, consistent with 60-79%.  Compared to the previous exam, these results remain essentially unchanged.      Past Medical History:  Diagnosis Date   Bradycardia    Bursitis    Carotid artery disease (HCC)    COPD (chronic obstructive pulmonary disease) (HCC)    Coronary artery disease    CTO mid-dist CX (fills from left-to-left collaterals), 99% OM2, 30% ost-dist LM, 30% prox-mid LAD, 30% ost-mid CX, 99% prox RCA (too small for PCI), medical management 02/09/20   Hypercholesterolemia    Ischemic cardiomyopathy    Lung cancer ([place])    Lung nodule    nscl ca dx'd 08/2019   Psoriasis    Tobacco abuse    Wears glasses    reading    Past Surgical History:  Procedure Laterality Date  BRONCHIAL BIOPSY  09/27/2019   Procedure: BRONCHIAL BIOPSIES;  Surgeon: Garner Nash, DO;  Location: [place] [facility];  Service: Pulmonary;;   BRONCHIAL BRUSHINGS  09/27/2019   Procedure: BRONCHIAL BRUSHINGS;  Surgeon: Garner Nash, DO;  Location: [place];  Service: Pulmonary;;   BRONCHIAL NEEDLE ASPIRATION BIOPSY  09/27/2019   Procedure: BRONCHIAL NEEDLE ASPIRATION BIOPSIES;  Surgeon: Garner Nash, DO;  Location: [place];  Service: Pulmonary;;   BRONCHIAL WASHINGS  09/27/2019   Procedure: BRONCHIAL WASHINGS;  Surgeon: Garner Nash, DO;  Location: [place] [facility];   Service: Pulmonary;;   COLONOSCOPY     CRYOTHERAPY  09/27/2019   Procedure: Cydney Ok;  Surgeon: Garner Nash, DO;  Location: [place] [facility];  Service: Pulmonary;;   FIDUCIAL MARKER PLACEMENT  09/27/2019   Procedure: FIDUCIAL MARKER PLACEMENT;  Surgeon: Garner Nash, DO;  Location: [place] [facility];  Service: Pulmonary;;   HEMOSTASIS CONTROL  09/27/2019   Procedure: HEMOSTASIS CONTROL;  Surgeon: Garner Nash, DO;  Location: [place];  Service: Pulmonary;;   HERNIA REPAIR     umbicial- no longer as a "belly button"   RIGHT/LEFT HEART CATH AND CORONARY ANGIOGRAPHY N/A 02/09/2020   Procedure: RIGHT/LEFT HEART CATH AND CORONARY ANGIOGRAPHY;  Surgeon: Burnell Blanks, MD;  Location: [place] [facility];  Service: Cardiovascular;  Laterality: N/A;   VIDEO BRONCHOSCOPY WITH ENDOBRONCHIAL NAVIGATION N/A 09/27/2019   Procedure: VIDEO BRONCHOSCOPY WITH ENDOBRONCHIAL NAVIGATION;  Surgeon: Garner Nash, DO;  Location: [place];  Service: Pulmonary;  Laterality: N/A;    MEDICATIONS: No current facility-administered medications for this encounter.    acitretin (SORIATANE) 25 MG capsule   allopurinol (ZYLOPRIM) 100 MG tablet   aspirin EC 81 MG tablet   clobetasol cream (TEMOVATE) 0.05 %   colchicine 0.6 MG tablet   Multiple Vitamins-Minerals (PRESERVISION AREDS 2 PO)   niacinamide 500 MG tablet   Omega-3 Fatty Acids (FISH OIL) 1200 MG CAPS   rosuvastatin (CRESTOR) 20 MG tablet   triamcinolone cream (KENALOG) 0.1 %   vitamin B-12 (CYANOCOBALAMIN) 500 MCG tablet    Myra Gianotti, PA-C Surgical Short Stay/Anesthesiology [facility] Phone [phone] [facility] Phone [phone] 03/28/2021 12:47 PM

## 2021-03-28 NOTE — Progress Notes (Addendum)
Mr. Gerald [name] asked me to speak with his wife, Gerald [name].  Mrs. Gerald [name] denies any s/s of Covid in the home; Mr. Gerald [name] tested Negative for Covid 03/27/21.  Mr. Gerald [name] has not had chest pain or shortness of breath at rest.  Mrs. Gerald [name] asked if Mr. Gerald [name] may drink water in am, I said yes, as well as black coffee, cranberry or apple juice, Gatorade until 1000.  Mrs. Gerald [name] said water and black coffee will be all that patient will drink.  I gave instructions for patient to shower with antibiotic soap, if it is available.  Dry off with a clean towel. Do not put lotion, powder, cologne or deodorant or makeup.No jewelry or piercings. Men may shave their face and neck. Woman should not shave. No nail polish, artificial or acrylic nails. Wear clean clothes, brush your teeth. Glasses, contact lens,dentures or partials may not be worn in the OR. If you need to wear them, please bring a case for glasses, do not wear contacts or bring a case, the hospital does not have contact cases, dentures or partials will have to be removed , make sure they are clean, we will provide a denture cup to put them in. You will need some one to drive you home and a responsible person over the age of 63 to stay with you for the first 24 hours after surgery.   Mr. Gerald [name] PCP is Dr. [name], cardiologist is Dr. Buford Dresser.

## 2021-03-29 ENCOUNTER — Ambulatory Visit (HOSPITAL_COMMUNITY): Payer: Medicare Other | Admitting: Vascular Surgery

## 2021-03-29 ENCOUNTER — Encounter (HOSPITAL_COMMUNITY): Payer: Self-pay | Admitting: Pulmonary Disease

## 2021-03-29 ENCOUNTER — Other Ambulatory Visit: Payer: Self-pay

## 2021-03-29 ENCOUNTER — Encounter (HOSPITAL_COMMUNITY)
Admission: RE | Disposition: A | Discharge status: Home / Self Care | Payer: Self-pay | Source: Home / Self Care | Attending: Pulmonary Disease

## 2021-03-29 ENCOUNTER — Ambulatory Visit (HOSPITAL_COMMUNITY)
Admission: RE | Admit: 2021-03-29 | Discharge: 2021-03-29 | Disposition: A | Discharge status: Home / Self Care | Payer: Medicare Other | Attending: Pulmonary Disease | Admitting: Pulmonary Disease

## 2021-03-29 DIAGNOSIS — C3491 Malignant neoplasm of unspecified part of right bronchus or lung: Secondary | ICD-10-CM

## 2021-03-29 DIAGNOSIS — R599 Enlarged lymph nodes, unspecified: Secondary | ICD-10-CM

## 2021-03-29 DIAGNOSIS — Z85118 Personal history of other malignant neoplasm of bronchus and lung: Secondary | ICD-10-CM | POA: Diagnosis not present

## 2021-03-29 DIAGNOSIS — I10 Essential (primary) hypertension: Secondary | ICD-10-CM | POA: Diagnosis not present

## 2021-03-29 DIAGNOSIS — Z801 Family history of malignant neoplasm of trachea, bronchus and lung: Secondary | ICD-10-CM | POA: Diagnosis not present

## 2021-03-29 DIAGNOSIS — K5730 Diverticulosis of large intestine without perforation or abscess without bleeding: Secondary | ICD-10-CM | POA: Diagnosis not present

## 2021-03-29 DIAGNOSIS — R911 Solitary pulmonary nodule: Secondary | ICD-10-CM | POA: Insufficient documentation

## 2021-03-29 DIAGNOSIS — J9811 Atelectasis: Secondary | ICD-10-CM | POA: Insufficient documentation

## 2021-03-29 DIAGNOSIS — I2510 Atherosclerotic heart disease of native coronary artery without angina pectoris: Secondary | ICD-10-CM | POA: Diagnosis not present

## 2021-03-29 DIAGNOSIS — I700 Atherosclerosis of aorta: Secondary | ICD-10-CM | POA: Insufficient documentation

## 2021-03-29 DIAGNOSIS — J432 Centrilobular emphysema: Secondary | ICD-10-CM | POA: Diagnosis not present

## 2021-03-29 DIAGNOSIS — J90 Pleural effusion, not elsewhere classified: Secondary | ICD-10-CM | POA: Insufficient documentation

## 2021-03-29 DIAGNOSIS — Z720 Tobacco use: Secondary | ICD-10-CM

## 2021-03-29 DIAGNOSIS — J439 Emphysema, unspecified: Secondary | ICD-10-CM | POA: Insufficient documentation

## 2021-03-29 DIAGNOSIS — F17210 Nicotine dependence, cigarettes, uncomplicated: Secondary | ICD-10-CM | POA: Diagnosis not present

## 2021-03-29 DIAGNOSIS — C771 Secondary and unspecified malignant neoplasm of intrathoracic lymph nodes: Secondary | ICD-10-CM | POA: Insufficient documentation

## 2021-03-29 DIAGNOSIS — C3411 Malignant neoplasm of upper lobe, right bronchus or lung: Secondary | ICD-10-CM

## 2021-03-29 HISTORY — PX: BRONCHIAL WASHINGS: SHX5105

## 2021-03-29 HISTORY — DX: Ischemic cardiomyopathy: I25.5

## 2021-03-29 HISTORY — PX: VIDEO BRONCHOSCOPY WITH ENDOBRONCHIAL ULTRASOUND: SHX6177

## 2021-03-29 HISTORY — DX: Atherosclerotic heart disease of native coronary artery without angina pectoris: I25.10

## 2021-03-29 HISTORY — DX: Bradycardia, unspecified: R00.1

## 2021-03-29 HISTORY — PX: BRONCHIAL NEEDLE ASPIRATION BIOPSY: SHX5106

## 2021-03-29 HISTORY — PX: CRYOTHERAPY: SHX6894

## 2021-03-29 HISTORY — DX: Malignant neoplasm of unspecified part of unspecified bronchus or lung: C34.90

## 2021-03-29 SURGERY — BRONCHOSCOPY, WITH EBUS
Anesthesia: General

## 2021-03-29 MED ORDER — PHENYLEPHRINE 40 MCG/ML (10ML) SYRINGE FOR IV PUSH (FOR BLOOD PRESSURE SUPPORT)
PREFILLED_SYRINGE | INTRAVENOUS | Status: DC | PRN
  Administered 2021-03-29: 80 ug via INTRAVENOUS

## 2021-03-29 MED ORDER — DEXAMETHASONE SODIUM PHOSPHATE 10 MG/ML IJ SOLN
INTRAMUSCULAR | Status: DC | PRN
  Administered 2021-03-29: 10 mg via INTRAVENOUS

## 2021-03-29 MED ORDER — CHLORHEXIDINE GLUCONATE 0.12 % MT SOLN
OROMUCOSAL | Status: AC
  Filled 2021-03-29: qty 15

## 2021-03-29 MED ORDER — LIDOCAINE 2% (20 MG/ML) 5 ML SYRINGE
INTRAMUSCULAR | Status: DC | PRN
  Administered 2021-03-29: 40 mg via INTRAVENOUS

## 2021-03-29 MED ORDER — ONDANSETRON HCL 4 MG/2ML IJ SOLN
INTRAMUSCULAR | Status: DC | PRN
  Administered 2021-03-29: 4 mg via INTRAVENOUS

## 2021-03-29 MED ORDER — ROCURONIUM BROMIDE 10 MG/ML (PF) SYRINGE
PREFILLED_SYRINGE | INTRAVENOUS | Status: DC | PRN
  Administered 2021-03-29: 50 mg via INTRAVENOUS

## 2021-03-29 MED ORDER — FENTANYL CITRATE (PF) 100 MCG/2ML IJ SOLN
INTRAMUSCULAR | Status: AC
  Filled 2021-03-29: qty 2

## 2021-03-29 MED ORDER — PROPOFOL 10 MG/ML IV BOLUS
INTRAVENOUS | Status: DC | PRN
  Administered 2021-03-29: 120 mg via INTRAVENOUS

## 2021-03-29 MED ORDER — PHENYLEPHRINE HCL-NACL 20-0.9 MG/250ML-% IV SOLN
INTRAVENOUS | Status: DC | PRN
  Administered 2021-03-29: 25 ug/min via INTRAVENOUS

## 2021-03-29 MED ORDER — AMISULPRIDE (ANTIEMETIC) 5 MG/2ML IV SOLN
10.0000 mg | Freq: Once | INTRAVENOUS | Status: DC | PRN
  Filled 2021-03-29: qty 4

## 2021-03-29 MED ORDER — LACTATED RINGERS IV SOLN: INTRAVENOUS | Status: DC

## 2021-03-29 MED ORDER — ONDANSETRON HCL 4 MG/2ML IJ SOLN: 4.0000 mg | Freq: Once | INTRAMUSCULAR | Status: DC | PRN

## 2021-03-29 MED ORDER — SUGAMMADEX SODIUM 200 MG/2ML IV SOLN
INTRAVENOUS | Status: DC | PRN
  Administered 2021-03-29 (×2): 100 mg via INTRAVENOUS

## 2021-03-29 MED ORDER — FENTANYL CITRATE (PF) 250 MCG/5ML IJ SOLN
INTRAMUSCULAR | Status: DC | PRN
  Administered 2021-03-29: 50 ug via INTRAVENOUS

## 2021-03-29 SURGICAL SUPPLY — 30 items

## 2021-03-29 NOTE — Op Note (Signed)
Video Bronchoscopy with Endobronchial Ultrasound Procedure Note  Date of Operation: 03/29/2021  Pre-op Diagnosis: Mediastinal adenopathy, lung mass  Post-op Diagnosis: Mediastinal adenopathy, lung mass  Surgeon: Garner Nash, DO   Assistants: None   Anesthesia: General endotracheal anesthesia  Operation: Flexible video fiberoptic bronchoscopy with endobronchial ultrasound and biopsies.  Estimated Blood Loss: Minimal  Complications: None   Indications and History: Gerald [name] is a 80 y.o. male with history of lung cancer, now with mediastinal adenopathy and lung mass.  The risks, benefits, complications, treatment options and expected outcomes were discussed with the patient.  The possibilities of pneumothorax, pneumonia, reaction to medication, pulmonary aspiration, perforation of a viscus, bleeding, failure to diagnose a condition and creating a complication requiring transfusion or operation were discussed with the patient who freely signed the consent.    Description of Procedure: The patient was examined in the preoperative area and history and data from the preprocedure consultation were reviewed. It was deemed appropriate to proceed.  The patient was taken to [facility] endoscopy room 3, identified as [name] and the procedure verified as Flexible Video Fiberoptic Bronchoscopy.  A Time Out was held and the above information confirmed. After being taken to the operating room general anesthesia was initiated and the patient  was orally intubated. The video fiberoptic bronchoscope was introduced via the endotracheal tube and a general inspection was performed which showed normal right and left lung anatomy however the right lower lobe distal segmental opening has a small polypoid lesion approximately 3 mm in size arising from the of the lateral segment. The standard scope was then withdrawn and the endobronchial ultrasound was used to identify and characterize the peritracheal,  hilar and bronchial lymph nodes. Inspection showed enlarged paratracheal adenopathy. Using real-time ultrasound guidance Wang needle biopsies were take from Station 4R nodes and were sent for cytology. The patient tolerated the procedure well without apparent complications. There was no significant blood loss.   Following the endobronchial ultrasound component the standard therapeutic bronchoscope was inserted into the airway and aspiration of the bilateral mainstem's was necessary for removal of remaining blood clots.  The 1.7 mm cryotherapy probe was used for tumor debulking and removal of the polypoid lesion from the opening of the right lower lobe.  The lesion was frozen and the probe was removed en bloc with the scope for extraction.  The lesion area was inspected there was no significant active bleeding.  The bronchoscope was then used for bronchoalveolar lavage to the right lower lobe with moderate return.  The endoscope was brought to just above the main carina there was no evidence of active bleeding.  Therapeutic scope was used for aspiration of the bilateral mainstem's remove any remaining's blood clots and secretions.  All distal segments were patent at the termination of the procedure. The bronchoscope was withdrawn. Anesthesia was reversed and the patient was taken to the PACU for recovery.   Samples: 1. Wang needle biopsies from 4R node 2.  Endobronchial cryobiopsies right lower lobe 3.  BAL right lower lobe  Plans:  The patient will be discharged from the PACU to home when recovered from anesthesia. We will review the cytology, pathology results with the patient when they become available. Outpatient followup will be with Garner Nash, DO and Dr. Earlie Server.  Garner Nash, DO [facility] 03/29/2021 2:15 PM

## 2021-03-29 NOTE — H&P (Signed)
Synopsis: Referred in June 2021 for upper lobe pulmonary nodule by Mosetta Anis, MD   Subjective:    PATIENT ID: Gerald [name] DOB: 1940-11-25, MRN: [id number]       Chief Complaint  Patient presents with   Consult      lung nodule      This is an 80 year old gentleman with a past medical history of hypertension, longstanding tobacco abuse history.  Patient underwent CT chest completed at [facility] in [place].  There was a 13 mm right upper lobe spiculated pulmonary nodule concerning for malignancy.  Also found to have a moderate right-sided pleural effusion with lower lobe atelectasis and possible right lower lobe endobronchial mucous plugging versus endobronchial lesion.  Decision was made for referral to pulmonary for further evaluation.  This CAT scan was originally completed on 07/28/2019.   OV 09/07/2019: Patient only complains today of cough.  He has this regularly.  Unfortunately he is still smoking.  2 packs/day for greater than the past 40+ years.  Brother recently diagnosed with lung cancer.  Patient denies hemoptysis and has at least a 20 pound weight loss over the past few months.  03/29/2021: Here today for evaluation for bronchoscopy.  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  Unfortunately this repeat imaging has revealed concern for recurrence of disease with associated mediastinal adenopathy.  PET scan reviewed today prior to procedure.  We discussed this with the patient.       Past Medical History:  Diagnosis Date   Bursitis     Psoriasis     Tobacco abuse             Family History  Problem Relation Age of Onset   Lung cancer Brother        History reviewed. No pertinent surgical history.   Social History         Socioeconomic History   Marital status: Married      Spouse name: Not on file   Number  of children: Not on file   Years of education: Not on file   Highest education level: Not on file  Occupational History   Not on file  Tobacco Use   Smoking status: Current Every Day Smoker      Packs/day: 2.00      Types: Cigarettes   Smokeless tobacco: Current User  Substance and Sexual Activity   Alcohol use: Not on file   Drug use: Not on file   Sexual activity: Not on file  Other Topics Concern   Not on file  Social History Narrative   Not on file    Social Determinants of Health       Financial Resource Strain:    Difficulty of Paying Living Expenses:   Food Insecurity:    Worried About Jet in the Last Year:    Arboriculturist in the Last Year:   Transportation Needs:    Film/video editor (Medical):    Lack of Transportation (Non-Medical):   Physical Activity:    Days of Exercise per Week:    Minutes of Exercise per Session:   Stress:    Feeling of Stress :   Social Connections:    Frequency of Communication with Friends and Family:    Frequency of Social Gatherings with Friends and Family:    Attends Religious  Services:    Active Member of Clubs or Organizations:    Attends Music therapist:    Marital Status:   Intimate Partner Violence:    Fear of Current or Ex-Partner:    Emotionally Abused:    Physically Abused:    Sexually Abused:       Not on File          Outpatient Medications Prior to Visit  Medication Sig Dispense Refill   omega-3 acid ethyl esters (LOVAZA) 1 g capsule         Omega-3 Fatty Acids (FISH OIL) 1000 MG CAPS Take by mouth.       pravastatin (PRAVACHOL) 20 MG tablet Take by mouth.       albuterol (VENTOLIN HFA) 108 (90 Base) MCG/ACT inhaler Inhale into the lungs.       amoxicillin-clavulanate (AUGMENTIN) 875-125 MG tablet         aspirin 81 MG chewable tablet Chew by mouth.       nystatin-triamcinolone (MYCOLOG II) cream          No facility-administered medications prior to visit.      Review of Systems  Constitutional:  Negative for chills, fever, malaise/fatigue and weight loss.  HENT:  Negative for hearing loss, sore throat and tinnitus.   Eyes:  Negative for blurred vision and double vision.  Respiratory:  Positive for cough, sputum production and shortness of breath. Negative for hemoptysis, wheezing and stridor.   Cardiovascular:  Negative for chest pain, palpitations, orthopnea, leg swelling and PND.  Gastrointestinal:  Negative for abdominal pain, constipation, diarrhea, heartburn, nausea and vomiting.  Genitourinary:  Negative for dysuria, hematuria and urgency.  Musculoskeletal:  Negative for joint pain and myalgias.  Skin:  Negative for itching and rash.  Neurological:  Negative for dizziness, tingling, weakness and headaches.  Endo/Heme/Allergies:  Negative for environmental allergies. Does not bruise/bleed easily.  Psychiatric/Behavioral:  Negative for depression. The patient is not nervous/anxious and does not have insomnia.   All other systems reviewed and are negative.      Objective:   General appearance: 80 y.o., [name], NAD, conversant  Eyes: anicteric sclerae, moist conjunctivae HENT: NCAT; oropharynx, MMM, no mucosal ulcerations; normal hard and soft palate Neck: Trachea midline; FROM, supple, lymphadenopathy, no JVD Lungs: CTAB, no crackles, no wheeze, CV: RRR, S1, S2, no MRGs  Abdomen: Soft, non-tender; non-distended, BS present  Extremities: No peripheral edema, radial and DP pulses present bilaterally  Skin: Normal temperature, turgor and texture; no rash Psych: Appropriate affect Neuro: Alert and oriented to person and place, no focal deficit       BP 123/61    Pulse (!) 45    Temp 98.5 F (36.9 C) (Oral)    Resp 17    Ht 6' (1.829 m)    Wt 86.2 kg    SpO2 99%    BMI 25.77 kg/m    97% on RA    BMI Readings from Last 3 Encounters:  09/07/19 25.28 kg/m       Wt Readings from Last 3 Encounters:  09/07/19 186 lb 6.4 oz (84.6 kg)         CBC Labs (Brief)  No results found for: WBC, RBC, HGB, HCT, PLT, MCV, MCH, MCHC, RDW, LYMPHSABS, MONOABS, EOSABS, BASOSABS       Chest Imaging: CT chest 07/25/2019: Completed at [facility] [place] [facility]. Images reviewed on disc brought to the office today.  Patient has 30 mm right upper lobe pulmonary spiculated nodule  concerning for primary bronchogenic carcinoma. There is also a moderate right-sided pleural effusion with right lower lobe atelectasis and possible right lower lobe endobronchial lesion versus mucous plugging. The patient's images have been independently reviewed by me.       Media Information   Document Information   Photos  Ct from [place] Right upper lobe pulmonary nodule   09/07/2019 14:13  Attached To:  Office Visit on 09/07/19 with Garner Nash, DO  Source Information   Takako Minckler, Octavio Graves, DO   [facility]      The patient's images have been independently reviewed by me.      03/13/2021: Nuclear medicine pet imaging: Hypermetabolic masslike soft tissue density in the right upper lobe with associated paratracheal adenopathy concerning for metastatic disease.  Additionally there is a new hypermetabolic focus within the liver concerning for an advanced age bronchogenic carcinoma. Likely has recurrence of previous disease. The patient's images have been independently reviewed by me.      Pulmonary Functions Testing Results: No flowsheet data found.   FeNO: None    Pathology: none   Echocardiogram: none    Heart Catheterization: none     Assessment & Plan:    History squamous cell carcinoma of the lung, Now with adenopathy and concerning PET scan for advanced disease.   Paratracheal adenopathy Hypermetabolic liver lesion.  80 year old gentleman, history of tobacco abuse, greater than 80-pack-year history, initially had a right upper lobe spiculated pulmonary nodule diagnosed with a stage I squamous cell carcinoma with  associated centrilobular emphysema.  Patient underwent SBRT followed by radiation oncology since 2021.  Now with CT imaging concerning for recurrence of disease and now has mediastinal adenopathy presents today for evaluation of bronchoscopy.  Plan: Patient is agreeable to proceed with video bronchoscopy the endobronchial ultrasound and transbronchial needle aspiration. We discussed the risk benefits and alternatives today prior to the procedure.   Garner Nash, DO [facility] 03/29/2021 12:53 PM

## 2021-03-29 NOTE — Interval H&P Note (Signed)
History and Physical Interval Note:  03/29/2021 12:56 PM  Gerald [name]  has presented today for surgery, with the diagnosis of adenopathy.  The various methods of treatment have been discussed with the patient and family. After consideration of risks, benefits and other options for treatment, the patient has consented to  Procedure(s): [place] (N/A) as a surgical intervention.  The patient's history has been reviewed, patient examined, no change in status, stable for surgery.  I have reviewed the patient's chart and labs.  Questions were answered to the patient's satisfaction.     [place]

## 2021-03-29 NOTE — Anesthesia Procedure Notes (Signed)
Procedure Name: Intubation Date/Time: 03/29/2021 1:19 PM Performed by: Lowella Dell, CRNA Pre-anesthesia Checklist: Patient identified, Emergency Drugs available, Suction available and Patient being monitored Patient Re-evaluated:Patient Re-evaluated prior to induction Oxygen Delivery Method: Circle System Utilized Preoxygenation: Pre-oxygenation with 100% oxygen Induction Type: IV induction Ventilation: Mask ventilation without difficulty and Two handed mask ventilation required Laryngoscope Size: Mac and 4 Grade View: Grade I Tube type: Oral Tube size: 8.5 mm Number of attempts: 1 Airway Equipment and Method: Stylet Placement Confirmation: ETT inserted through vocal cords under direct vision, positive ETCO2 and breath sounds checked- equal and bilateral Secured at: 23 cm Tube secured with: Tape Dental Injury: Teeth and Oropharynx as per pre-operative assessment

## 2021-03-29 NOTE — Transfer of Care (Signed)
Immediate Anesthesia Transfer of Care Note  Patient: Gerald [name]  Procedure(s) Performed: VIDEO BRONCHOSCOPY WITH ENDOBRONCHIAL ULTRASOUND CRYOTHERAPY BRONCHIAL NEEDLE ASPIRATION BIOPSIES BRONCHIAL WASHINGS  Patient Location: PACU  Anesthesia Type:General  Level of Consciousness: awake, alert  and oriented  Airway & Oxygen Therapy: Patient Spontanous Breathing and Patient connected to face mask oxygen  Post-op Assessment: Report given to RN and Post -op Vital signs reviewed and stable  Post vital signs: Reviewed and stable  Last Vitals:  Vitals Value Taken Time  BP 102/44 03/29/21 1413  Temp    Pulse 74 03/29/21 1415  Resp 18 03/29/21 1415  SpO2 98 % 03/29/21 1415  Vitals shown include unvalidated device data.  Last Pain:  Vitals:   03/29/21 1037  TempSrc:   PainSc: 0-No pain         Complications: No notable events documented.

## 2021-03-29 NOTE — Anesthesia Postprocedure Evaluation (Signed)
Anesthesia Post Note  Patient: Gerald [name]  Procedure(s) Performed: VIDEO BRONCHOSCOPY WITH ENDOBRONCHIAL ULTRASOUND CRYOTHERAPY BRONCHIAL NEEDLE ASPIRATION BIOPSIES BRONCHIAL WASHINGS     Patient location during evaluation: PACU Anesthesia Type: General Level of consciousness: awake and alert Pain management: pain level controlled Vital Signs Assessment: post-procedure vital signs reviewed and stable Respiratory status: spontaneous breathing, nonlabored ventilation, respiratory function stable and patient connected to nasal cannula oxygen Cardiovascular status: blood pressure returned to baseline and stable Postop Assessment: no apparent nausea or vomiting Anesthetic complications: no   No notable events documented.  Last Vitals:  Vitals:   03/29/21 1430 03/29/21 1445  BP: (!) 105/59 114/69  Pulse: 81 75  Resp: 13 20  Temp:  (!) 36.4 C  SpO2: 91% 92%    Last Pain:  Vitals:   03/29/21 1445  TempSrc:   PainSc: 0-No pain                 Audry Pili

## 2021-03-29 NOTE — Discharge Instructions (Signed)
Flexible Bronchoscopy, Care After This sheet gives you information about how to care for yourself after your test. Your doctor may also give you more specific instructions. If you have problems or questions, contact your doctor. Follow these instructions at home: Eating and drinking Do not eat or drink anything (not even water) for 2 hours after your test, or until your numbing medicine (local anesthetic) wears off. When your numbness is gone and your cough and gag reflexes have come back, you may: Eat only soft foods. Slowly drink liquids. The day after the test, go back to your normal diet. Driving Do not drive for 24 hours if you were given a medicine to help you relax (sedative). Do not drive or use heavy machinery while taking prescription pain medicine. General instructions  Take over-the-counter and prescription medicines only as told by your doctor. Return to your normal activities as told. Ask what activities are safe for you. Do not use any products that have nicotine or tobacco in them. This includes cigarettes and e-cigarettes. If you need help quitting, ask your doctor. Keep all follow-up visits as told by your doctor. This is important. It is very important if you had a tissue sample (biopsy) taken. Get help right away if: You have shortness of breath that gets worse. You get light-headed. You feel like you are going to pass out (faint). You have chest pain. You cough up: More than a little blood. More blood than before. Summary Do not eat or drink anything (not even water) for 2 hours after your test, or until your numbing medicine wears off. Do not use cigarettes. Do not use e-cigarettes. Get help right away if you have chest pain.  This information is not intended to replace advice given to you by your health care provider. Make sure you discuss any questions you have with your health care provider. Document Released: 01/12/2009 Document Revised: 02/27/2017 Document  Reviewed: 04/04/2016 Elsevier Patient Education  2020 [employer].

## 2021-04-01 ENCOUNTER — Encounter (HOSPITAL_COMMUNITY): Payer: Self-pay | Admitting: Pulmonary Disease

## 2021-04-03 ENCOUNTER — Telehealth: Payer: Self-pay | Admitting: Pulmonary Disease

## 2021-04-03 LAB — SURGICAL PATHOLOGY

## 2021-04-03 LAB — CYTOLOGY - NON PAP

## 2021-04-03 NOTE — Telephone Encounter (Signed)
PCCM:  I called and discussed path results.  I spoke with daughter and wife as well.   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 Julien Nordmann coming up  Garner Nash, [facility] 04/03/2021 7:44 PM

## 2021-04-04 ENCOUNTER — Encounter: Payer: Self-pay | Admitting: *Deleted

## 2021-04-04 NOTE — Progress Notes (Signed)
Oncology Nurse Navigator Documentation  Oncology Nurse Navigator Flowsheets 04/04/2021 03/27/2021 03/26/2021  Abnormal Finding Date - - 03/01/2021  Navigator Follow Up Date: 04/05/2021 04/04/2021 03/28/2021  Navigator Follow Up Reason: Appointment Review;Patient Call Pathology Appointment Review  Navigator Location [facility]  Navigator Encounter Type Telephone Appt/Treatment Plan Review [facility]  Telephone Outgoing Call/I received a message from Dr. Julien Nordmann and Dr. Valeta Harms with an update on pathology. Dr. Julien Nordmann would like to see him sooner than 1/17.  I called patient to re-schedule but was unable to reach. I did leave vm message to call with my name and phone number.  - -  Patient Visit Type - Other MedOnc  Treatment Phase Pre-Tx/Tx Discussion Abnormal Scans -  Barriers/Navigation Needs Coordination of Care;Education Coordination of Care;Education Coordination of Care  Education Other Other -  Interventions Coordination of Care;Education Coordination of Care;Education Coordination of Care  Acuity Level 2-Minimal Needs (1-2 Barriers Identified) Level 3-Moderate Needs (3-4 Barriers Identified) Level 2-Minimal Needs (1-2 Barriers Identified)  Coordination of Care - Appts Other  Education Method Verbal Verbal -  Time Spent with Patient [phone]

## 2021-04-05 ENCOUNTER — Telehealth: Payer: Self-pay | Admitting: Internal Medicine

## 2021-04-05 NOTE — Telephone Encounter (Signed)
Sch per 1/3 los, pt wife aware

## 2021-04-09 ENCOUNTER — Telehealth: Payer: Self-pay | Admitting: Physician Assistant

## 2021-04-09 NOTE — Telephone Encounter (Signed)
Rescheduled 01/17 appointment to 01/11 per Cassie's request, called patient w/new appointment time. Patient will be notified per spouse.

## 2021-04-09 NOTE — Progress Notes (Signed)
[facility] OFFICE PROGRESS NOTE  Gerald Nutting, Gerald [name] [place] [address]  DIAGNOSIS:  1) Stage IV Extensive stage (T2 a, N2, M1b) Small Cell Lung cancer. He presented with a right upper lobe lung mass and small satellite nodules which invades to the anterior mediastinum.  He also has an enlarged hypermetabolic right lower paratracheal lymph node.  He also has a new hypermetabolic lesion in the inferior right lobe of the liver concerning for metastatic disease. Diagnosed in January 2023.  2) stage Ia non-small cell lung cancer, squamous cell carcinoma involving right upper lobe pulmonary nodule in addition to right lower lobe endobronchial lesion diagnosed in June 2021.    PRIOR THERAPY: SBRT to the right upper lobe pulmonary nodule as well as the right lower lobe pulmonary nodule under the care of Dr. Sondra Come completed on 11/14/2019.  CURRENT THERAPY: Palliative systemic chemotherapy with carboplatin for an AUC of 5, etoposide 100 mg per metered squared, and Imfinzi 1500 mg IV every 3 weeks with Cosela.  First dose expected on 04/16/21.   INTERVAL HISTORY: Gerald [name] 81 y.o. male returns to the clinic today for a follow-up visit accompanied by his daughter. His son and wife are available by phone. The patient last saw Dr. Julien Nordmann on 03/26/2021.  The patient has a history of squamous cell carcinoma in the right lower lobe.  The patient had a restaging CT scan performed which showed evidence of disease recurrence.  Dr. Julien Nordmann arrange for a PET scan which, unfortunately, showed likely metastatic disease.  The patient was seen by Dr. Valeta Harms for bronchoscopy.  Interestingly, the patient's pathology was consistent with small cell lung cancer in the 4R lymph node. The RLL lavage and biopsy showed atypical cells and dysplastic squamous epithelium  Overall, the patient is feeling similar to his last appointment.  He reports  dyspnea on exertion. He  continues to smoke 2 packs of cigarettes per day. He has a baseline cough which produces grey phlegm.  He notices he coughs more after Denies any chest pain or hemoptysis. Denies any nausea, vomiting, unusual diarrhea, or constipation. Denies history of arrhythmia or palpitations. Denies any headache or visual changes besides his visual changes associated with a recent cataract surgery.  Scheduled for brain MRI on 04/11/2021.  He is here today for evaluation to review the pathology results and for more detailed discussion about his current condition and recommended treatment options.   MEDICAL HISTORY: Past Medical History:  Diagnosis Date   Bradycardia    Bursitis    Carotid artery disease (HCC)    COPD (chronic obstructive pulmonary disease) (HCC)    Coronary artery disease    CTO mid-dist CX (fills from left-to-left collaterals), 99% OM2, 30% ost-dist LM, 30% prox-mid LAD, 30% ost-mid CX, 99% prox RCA (too small for PCI), medical management 02/09/20   Hypercholesterolemia    Ischemic cardiomyopathy    Lung cancer ([place])    Lung nodule    nscl ca dx'd 08/2019   Psoriasis    Tobacco abuse    Wears glasses    reading    ALLERGIES:  has No Known Allergies.  MEDICATIONS:  Current Outpatient Medications  Medication Sig Dispense Refill   acitretin (SORIATANE) 25 MG capsule Take 25 mg by mouth in the morning.     allopurinol (ZYLOPRIM) 100 MG tablet Take 100 mg by mouth in the morning.     aspirin EC 81 MG tablet Take 1 tablet (81  mg total) by mouth daily. Swallow whole. 90 tablet 3   clobetasol cream (TEMOVATE) 4.09 % Apply 1 application topically daily as needed (Psoriasis).     colchicine 0.6 MG tablet Take 1.2mg  initially followed by 0.6mg  in 1 hour.  Continue 0.6mg  daily until improvement. (Patient taking differently: Take 0.6-1.2 mg by mouth See admin instructions. Take 1.2mg  initially followed by 0.6mg  in 1 hour.  Continue 0.6mg  daily until improvement.) 20 tablet 1   Multiple  Vitamins-Minerals (PRESERVISION AREDS 2 PO) Take 1 tablet by mouth in the morning and at bedtime.     niacinamide 500 MG tablet Take 500 mg by mouth daily with breakfast.     Omega-3 Fatty Acids (FISH OIL) 1200 MG CAPS Take 1,200 mg by mouth in the morning.     prochlorperazine (COMPAZINE) 10 MG tablet Take 1 tablet (10 mg total) by mouth every 6 (six) hours as needed. 30 tablet 2   rosuvastatin (CRESTOR) 20 MG tablet Take 1 tablet by mouth once daily (Patient taking differently: Take 20 mg by mouth every evening.) 90 tablet 3   triamcinolone cream (KENALOG) 0.1 % Apply 1 application topically daily as needed (skin irritation.).     vitamin B-12 (CYANOCOBALAMIN) 500 MCG tablet Take 500 mcg by mouth every evening.     No current facility-administered medications for this visit.    SURGICAL HISTORY:  Past Surgical History:  Procedure Laterality Date   BRONCHIAL BIOPSY  09/27/2019   Procedure: BRONCHIAL BIOPSIES;  Surgeon: Garner Nash, Gerald [name];  Location: [facility];  Service: Pulmonary;;   BRONCHIAL BRUSHINGS  09/27/2019   Procedure: BRONCHIAL BRUSHINGS;  Surgeon: Garner Nash, Gerald [name];  Location: [facility];  Service: Pulmonary;;   BRONCHIAL NEEDLE ASPIRATION BIOPSY  09/27/2019   Procedure: BRONCHIAL NEEDLE ASPIRATION BIOPSIES;  Surgeon: Garner Nash, Gerald [name];  Location: [facility];  Service: Pulmonary;;   BRONCHIAL NEEDLE ASPIRATION BIOPSY  03/29/2021   Procedure: BRONCHIAL NEEDLE ASPIRATION BIOPSIES;  Surgeon: Garner Nash, Gerald [name];  Location: [facility];  Service: Pulmonary;;   BRONCHIAL WASHINGS  09/27/2019   Procedure: BRONCHIAL WASHINGS;  Surgeon: Garner Nash, Gerald [name];  Location: [facility];  Service: Pulmonary;;   BRONCHIAL WASHINGS  03/29/2021   Procedure: BRONCHIAL WASHINGS;  Surgeon: Garner Nash, Gerald [name];  Location: [facility];  Service: Pulmonary;;   COLONOSCOPY     CRYOTHERAPY  09/27/2019   Procedure: Cydney Ok;  Surgeon: Garner Nash, Gerald [name];  Location: [facility];  Service: Pulmonary;;   CRYOTHERAPY  03/29/2021   Procedure: CRYOTHERAPY;  Surgeon: Garner Nash, Gerald [name];  Location: [facility];  Service: Pulmonary;;   FIDUCIAL MARKER PLACEMENT  09/27/2019   Procedure: FIDUCIAL MARKER PLACEMENT;  Surgeon: Garner Nash, Gerald [name];  Location: [facility];  Service: Pulmonary;;   HEMOSTASIS CONTROL  09/27/2019   Procedure: HEMOSTASIS CONTROL;  Surgeon: Garner Nash, Gerald [name];  Location: [facility];  Service: Pulmonary;;   HERNIA REPAIR     umbicial- no longer as a "belly button"   RIGHT/LEFT HEART CATH AND CORONARY ANGIOGRAPHY N/A 02/09/2020   Procedure: RIGHT/LEFT HEART CATH AND CORONARY ANGIOGRAPHY;  Surgeon: Burnell Blanks, MD;  Location: [facility];  Service: Cardiovascular;  Laterality: N/A;   VIDEO BRONCHOSCOPY WITH ENDOBRONCHIAL NAVIGATION N/A 09/27/2019   Procedure: VIDEO BRONCHOSCOPY WITH ENDOBRONCHIAL NAVIGATION;  Surgeon: Garner Nash, Gerald [name];  Location: [facility];  Service: Pulmonary;  Laterality: N/A;   VIDEO BRONCHOSCOPY WITH ENDOBRONCHIAL ULTRASOUND N/A 03/29/2021   Procedure: VIDEO BRONCHOSCOPY WITH ENDOBRONCHIAL ULTRASOUND;  Surgeon: Valeta Harms,  Octavio Graves, Gerald [name];  Location: [facility];  Service: Pulmonary;  Laterality: N/A;    REVIEW OF SYSTEMS:   Review of Systems  Constitutional: Positive for fatigue. Negative for appetite change, chills,  fever and unexpected weight change.  HENT:   Negative for mouth sores, nosebleeds, sore throat and trouble swallowing.   Eyes: Negative for eye problems and icterus.  Respiratory: Positive for baseline dyspnea on exertion. Positive for cough. Negative for hemoptysis and wheezing.   Cardiovascular: Negative for chest pain and leg swelling.  Gastrointestinal: Negative for abdominal pain, constipation, diarrhea, nausea and vomiting.  Genitourinary: Negative for bladder incontinence, difficulty urinating, dysuria, frequency and hematuria.   Musculoskeletal: Negative for back pain,  gait problem, neck pain and neck stiffness.  Skin: Positive for baseline rash. Has history of psoriasis per chart review.  Neurological: Negative for dizziness, extremity weakness, gait problem, headaches, light-headedness and seizures.  Hematological: Negative for adenopathy. Does not bruise/bleed easily.  Psychiatric/Behavioral: Negative for confusion, depression and sleep disturbance. The patient is not nervous/anxious.     PHYSICAL EXAMINATION:  Blood pressure 135/73, pulse 71, temperature 97.7 F (36.5 C), temperature source Axillary, resp. rate 16, height 6' (1.829 m), weight 196 lb 4.8 oz (89 kg), SpO2 96 %.  ECOG PERFORMANCE STATUS: 1  Physical Exam  Constitutional: Oriented to person, place, and time and well-developed, well-nourished, and in no distress. HENT:  Head: Normocephalic and atraumatic.  Mouth/Throat: Oropharynx is clear and moist. No oropharyngeal exudate.  Eyes: Conjunctivae are normal. Right eye exhibits no discharge. Left eye exhibits no discharge. No scleral icterus.  Neck: Normal range of motion. Neck supple.  Cardiovascular: Normal rate, regular rhythm, normal heart sounds and intact distal pulses.   Pulmonary/Chest: Effort normal. Rhonchi noted bilaterally. No respiratory distress. No wheezes. No rales.  Abdominal: Soft. Bowel sounds are normal. Exhibits no distension and no mass. There is no tenderness.  Musculoskeletal: Normal range of motion. Exhibits no edema.  Lymphadenopathy:    No cervical adenopathy.  Neurological: Alert and oriented to person, place, and time. Exhibits normal muscle tone. Gait normal. Coordination normal.  Skin: Skin is warm and dry. No rash noted. Not diaphoretic. No erythema. No pallor.  Psychiatric: Mood, memory and judgment normal.  Vitals reviewed.  LABORATORY DATA: Lab Results  Component Value Date   WBC 9.2 04/10/2021   HGB 15.1 04/10/2021   HCT 46.0 04/10/2021   MCV 96.0 04/10/2021   PLT 194 04/10/2021       Chemistry      Component Value Date/Time   NA 139 04/10/2021 1111   NA 139 02/07/2020 1208   K 4.4 04/10/2021 1111   CL 102 04/10/2021 1111   CO2 32 04/10/2021 1111   BUN 20 04/10/2021 1111   BUN 17 02/07/2020 1208   CREATININE 1.10 04/10/2021 1111      Component Value Date/Time   CALCIUM 9.5 04/10/2021 1111   ALKPHOS 60 04/10/2021 1111   AST 14 (L) 04/10/2021 1111   ALT 12 04/10/2021 1111   BILITOT 0.5 04/10/2021 1111       RADIOGRAPHIC STUDIES:  NM PET Image Restage (PS) Skull Base to Thigh (F-18 FDG)  Result Date: 03/14/2021 CLINICAL DATA:  Follow-up treatment strategy for non-small cell lung cancer. EXAM: NUCLEAR MEDICINE PET SKULL BASE TO THIGH TECHNIQUE: 9.6 mCi F-18 FDG was injected intravenously. Full-ring PET imaging was performed from the skull base to thigh after the radiotracer. CT data was obtained and used for attenuation correction and anatomic localization. Fasting blood glucose:  103 mg/dl COMPARISON:  PET-CT dated September 12, 2019; chest CT dated February 29, 2020 FINDINGS: Mediastinal blood pool activity: SUV max 2.0 Liver activity: SUV max 3.5 NECK: No hypermetabolic lymph nodes in the neck. Incidental CT findings: Unchanged enlarged and heterogeneous right thyroid lobe. CHEST: Hypermetabolic irregular mass of the anterior right upper lobe which measures approximately 3.8 by 1.6 cm and invades into the mediastinal fat with SUV max of 16.8. Small satellite nodules are seen. For example, hypermetabolic nodule measuring 6 mm is seen on series 4, image 88 located more inferiorly in the right cardiophrenic fat with SUV max of 3.6. Hypermetabolic right lower paratracheal lymph node with an SUV max of 18. Stable linear opacities of the right upper lobe which likely represent post treatment changes small right pleural effusion with adjacent atelectasis, unchanged when compared with prior exam. Incidental CT findings: Centrilobular emphysema. Three-vessel coronary artery  calcifications. Atherosclerotic disease of the thoracic aorta. ABDOMEN/PELVIS: Focal hypermetabolic uptake in the inferior right lobe of the liver with SUV max of 9.8 which correlates with a subtle low-attenuation lesion measuring 9 mm on CT series 4, image 147. No abnormal hypermetabolic activity within the pancreas, adrenal glands, or spleen. No hypermetabolic lymph nodes in the abdomen or pelvis. Incidental CT findings: Cholelithiasis. Extensive diverticulosis. Small fat containing right inguinal hernia atherosclerotic disease of the thoracic aorta. SKELETON: No focal hypermetabolic activity to suggest skeletal metastasis. Incidental CT findings: none IMPRESSION: 1. Hypermetabolic masslike soft tissue of the right upper lobe with small satellite nodules which invades into the anterior mediastinal fat. Findings are concerning for new or recurrent disease. 2. Enlarged hypermetabolic right lower paratracheal lymph node, compatible with metastatic disease. 3. New hypermetabolic lesion of the inferior right lobe of the liver, concerning for metastatic disease. 4. Aortic Atherosclerosis (ICD10-I70.0) and Emphysema (ICD10-J43.9). Electronically Signed   By: Yetta Glassman M.D.   On: 03/14/2021 16:00     ASSESSMENT/PLAN:  This is a very pleasant 81 year old Caucasian male diagnosed initially with stage Ia non-small cell lung cancer, squamous cell carcinoma in the right lower lobe.  He also has a right lower lobe endobronchial lesion which was diagnosed in June 2021.  He is status post SBRT to this lesion under the care of Dr. Sondra Come.  The patient recently had a restaging CT scan performed which unfortunately showed a new masslike area in the medial aspect of the right upper lobe with apparent invasion into the mediastinal fat and bulky right paratracheal leg adenopathy as well as a liver lesion concerning for metastatic disease.   The patient saw Dr. Valeta Harms for navigational bronchoscopy and the final pathology  in the lymph nodes showed small cell lung cancer in the 4R lymph nodes.  The RLL pathology showed dysplastic squamous epithelium.  The patient was seen with Dr. Julien Nordmann today.  Dr. Julien Nordmann had a lengthy discussion with the patient today about his current condition and recommended treatment options.  Dr. Julien Nordmann discussed that this is likely extensive stage small cell lung cancer which is treatable but not curable.  Dr. Julien Nordmann discussed that the patient's options include palliative systemic chemotherapy and immunotherapy with carboplatin for an AUC of 5, etoposide 100 mg per metered squared, and Imfinzi 1500 mg IV every 3 weeks with Cosela for myeloprotection vs referral to palliative care/hospice.  The patient is interested in proceeding with treatment and he will undergo his first cycle of treatment around 04/16/2021.  I will arrange for chemo education class prior to receiving his first cycle of treatment.  The adverse side effects of treatment were discussed including but not limited to alopecia, nausea, vomiting, myelosuppression, peripheral neuropathy, kidney, and liver dysfunction.  We will see the patient back for follow-up visit in 2 weeks for evaluation and 1 week follow-up visit to manage any adverse side effects of treatment.  I sent a prescription for Compazine 10 mg p.o. every 6 hours to the patient's pharmacy if needed for nausea or vomiting.  He will have his brain MRI as scheduled later this week on 04/11/2021. If he has heavy burden of metastatic disease to the brain, then we will delay starting treatment until after he receives radiation to the brain.   The patient was strongly encouraged to quit smoking.   The patient was advised to call immediately if he has any concerning symptoms in the interval. The patient voices understanding of current disease status and treatment options and is in agreement with the current care plan. All questions were answered. The patient knows to call  the clinic with any problems, questions or concerns. We can certainly see the patient much sooner if necessary   Orders Placed This Encounter  Procedures   CBC with Differential ([facility] Only)    Standing Status:   Standing    Number of Occurrences:   12    Standing Expiration Date:   04/10/2022   CMP ([facility] only)    Standing Status:   Standing    Number of Occurrences:   12    Standing Expiration Date:   04/10/2022   TSH    Standing Status:   Standing    Number of Occurrences:   4    Standing Expiration Date:   04/10/2022      Halsey Persaud L Darilyn Storbeck, PA-C 04/10/21  ADDENDUM: Hematology/Oncology Attending: I had a face-to-face encounter with the patient today.  I reviewed his record, lab, scans and recommended his care plan.  This is a very pleasant 81 years old white male recently diagnosed with extensive stage (T2 a, N2, M1 B) small cell lung cancer diagnosed in December 2022 and presented with masslike area in the medial aspect of the right upper lobe with apparent invasion of the mediastinal and bulky right paratracheal lymphadenopathy as well as liver metastasis.  The patient has a history of stage Ia non-small cell lung cancer, squamous cell carcinoma involving the right lower lobe status post SBRT in June 2021 under the care of Dr. Sondra Come. I had a lengthy discussion with the patient and his daughter who was available with him in the clinic as well as his wife and son who were available by phone during the visit about his current disease stage, prognosis and treatment options. I explained to the patient that he has incurable condition and all the treatment will be of palliative nature. I discussed with the patient the prognosis with and without treatment. The patient was given the option of palliative care and hospice referral versus palliative systemic chemotherapy with carboplatin for AUC of 5 on day 1, etoposide 100 Mg/M2 on days 1, 2 and 3 with Imfinzi 1500 Mg IV  on day 1 every 3 weeks in addition to Cosela for Myeloprotection on the days of the chemotherapy. The patient and his family are interested in proceeding with systemic therapy. He is expected to start the first cycle of his treatment on April 16, 2021. I discussed with the patient the adverse effect of this treatment including but not limited to alopecia, myelosuppression, nausea and vomiting, peripheral neuropathy,  liver or renal dysfunction as well as immunotherapy adverse effects. He will have a chemotherapy education class before the first dose of his treatment. Will call his pharmacy with prescription for Compazine 10 mg p.o. every 6 hours as needed for nausea. The patient will come back for follow-up visit in 1 week after the treatment for evaluation and management of any adverse effect of his treatment. He was advised to call immediately if he has any other concerning symptoms in the interval. The total time spent in the appointment was 40 minutes. Disclaimer: This note was dictated with voice recognition software. Similar sounding words can inadvertently be transcribed and may be missed upon review. Eilleen Kempf, MD 04/10/21

## 2021-04-10 ENCOUNTER — Inpatient Hospital Stay (HOSPITAL_BASED_OUTPATIENT_CLINIC_OR_DEPARTMENT_OTHER): Payer: Medicare Other | Admitting: Physician Assistant

## 2021-04-10 ENCOUNTER — Encounter: Payer: Self-pay | Admitting: *Deleted

## 2021-04-10 ENCOUNTER — Other Ambulatory Visit: Payer: Self-pay | Admitting: Medical Oncology

## 2021-04-10 ENCOUNTER — Other Ambulatory Visit: Payer: Self-pay | Admitting: Internal Medicine

## 2021-04-10 ENCOUNTER — Encounter: Payer: Self-pay | Admitting: Physician Assistant

## 2021-04-10 ENCOUNTER — Inpatient Hospital Stay: Payer: Medicare Other | Attending: Internal Medicine

## 2021-04-10 ENCOUNTER — Other Ambulatory Visit: Payer: Self-pay

## 2021-04-10 VITALS — BP 135/73 | HR 71 | Temp 97.7°F | Resp 16 | Ht 72.0 in | Wt 196.3 lb

## 2021-04-10 DIAGNOSIS — C3490 Malignant neoplasm of unspecified part of unspecified bronchus or lung: Secondary | ICD-10-CM

## 2021-04-10 DIAGNOSIS — R5383 Other fatigue: Secondary | ICD-10-CM

## 2021-04-10 DIAGNOSIS — F17210 Nicotine dependence, cigarettes, uncomplicated: Secondary | ICD-10-CM | POA: Insufficient documentation

## 2021-04-10 DIAGNOSIS — C3411 Malignant neoplasm of upper lobe, right bronchus or lung: Secondary | ICD-10-CM | POA: Insufficient documentation

## 2021-04-10 DIAGNOSIS — Z79899 Other long term (current) drug therapy: Secondary | ICD-10-CM | POA: Insufficient documentation

## 2021-04-10 DIAGNOSIS — Z5112 Encounter for antineoplastic immunotherapy: Secondary | ICD-10-CM | POA: Insufficient documentation

## 2021-04-10 DIAGNOSIS — Z7189 Other specified counseling: Secondary | ICD-10-CM | POA: Diagnosis not present

## 2021-04-10 DIAGNOSIS — Z5111 Encounter for antineoplastic chemotherapy: Secondary | ICD-10-CM | POA: Diagnosis present

## 2021-04-10 LAB — CMP (CANCER CENTER ONLY)
ALT: 12 U/L (ref 0–44)
AST: 14 U/L — ABNORMAL LOW (ref 15–41)
Albumin: 4 g/dL (ref 3.5–5.0)
Alkaline Phosphatase: 60 U/L (ref 38–126)
Anion gap: 5 (ref 5–15)
BUN: 20 mg/dL (ref 8–23)
CO2: 32 mmol/L (ref 22–32)
Calcium: 9.5 mg/dL (ref 8.9–10.3)
Chloride: 102 mmol/L (ref 98–111)
Creatinine: 1.1 mg/dL (ref 0.61–1.24)
GFR, Estimated: >60 mL/min (ref >60)
Glucose, Bld: 86 mg/dL (ref 70–99)
Potassium: 4.4 mmol/L (ref 3.5–5.1)
Sodium: 139 mmol/L (ref 135–145)
Total Bilirubin: 0.5 mg/dL (ref 0.3–1.2)
Total Protein: 7 g/dL (ref 6.5–8.1)

## 2021-04-10 LAB — CBC WITH DIFFERENTIAL (CANCER CENTER ONLY)
Abs Immature Granulocytes: 0.02 10*3/uL (ref 0.00–0.07)
Basophils Absolute: 0.1 10*3/uL (ref 0.0–0.1)
Basophils Relative: 1 %
Eosinophils Absolute: 0.2 10*3/uL (ref 0.0–0.5)
Eosinophils Relative: 2 %
HCT: 46 % (ref 39.0–52.0)
Hemoglobin: 15.1 g/dL (ref 13.0–17.0)
Immature Granulocytes: 0 %
Lymphocytes Relative: 14 %
Lymphs Abs: 1.3 10*3/uL (ref 0.7–4.0)
MCH: 31.5 pg (ref 26.0–34.0)
MCHC: 32.8 g/dL (ref 30.0–36.0)
MCV: 96 fL (ref 80.0–100.0)
Monocytes Absolute: 0.7 10*3/uL (ref 0.1–1.0)
Monocytes Relative: 8 %
Neutro Abs: 6.9 10*3/uL (ref 1.7–7.7)
Neutrophils Relative %: 75 %
Platelet Count: 194 10*3/uL (ref 150–400)
RBC: 4.79 MIL/uL (ref 4.22–5.81)
RDW: 14.1 % (ref 11.5–15.5)
WBC Count: 9.2 10*3/uL (ref 4.0–10.5)
nRBC: 0 % (ref 0.0–0.2)

## 2021-04-10 MED ORDER — PROCHLORPERAZINE MALEATE 10 MG PO TABS: 10.0000 mg | ORAL_TABLET | Freq: Four times a day (QID) | ORAL | 2 refills | Status: AC | PRN

## 2021-04-10 NOTE — Progress Notes (Signed)
START ON PATHWAY REGIMEN - Small Cell Lung     Cycles 1 through 4: A cycle is every 21 days:     Durvalumab      Carboplatin      Etoposide    Cycles 5 and beyond: A cycle is every 28 days:     Durvalumab   **Always confirm dose/schedule in your pharmacy ordering system**  Patient Characteristics: Newly Diagnosed, Preoperative or Nonsurgical Candidate (Clinical Staging), First Line, Extensive Stage Therapeutic Status: Newly Diagnosed, Preoperative or Nonsurgical Candidate (Clinical Staging) AJCC T Category: cT2a AJCC N Category: cN2 AJCC M Category: cM1b AJCC 8 Stage Grouping: IVA Stage Classification: Extensive  Intent of Therapy: Non-Curative / Palliative Intent, Discussed with Patient

## 2021-04-10 NOTE — Progress Notes (Signed)
Oncology Nurse Navigator Documentation  Oncology Nurse Navigator Flowsheets 04/10/2021 04/04/2021 03/27/2021 03/26/2021  Abnormal Finding Date - - - 03/01/2021  Navigator Follow Up Date: 04/10/2021 04/05/2021 04/04/2021 03/28/2021  Navigator Follow Up Reason: Follow-up Appointment Appointment Review;Patient Call Pathology Appointment Review  Navigator Location [facility]  Navigator Encounter Type Clinic/MDC/I spoke to patient and his daughter today during their visit with med onc.  I gave information on dx and help explain plan of care.  Telephone Appt/Treatment Plan Review Clinic/MDC  Telephone - Outgoing Call - -  Patient Visit Type MedOnc - Other MedOnc  Treatment Phase Pre-Tx/Tx Discussion Pre-Tx/Tx Discussion Abnormal Scans -  Barriers/Navigation Needs Education Coordination of Care;Education Coordination of Care;Education Coordination of Care  Education Newly Diagnosed Cancer Education;Understanding Cancer/ Treatment Options;Other Other Other -  Interventions Education;Psycho-Social Support Coordination of Care;Education Coordination of Care;Education Coordination of Care  Acuity Level 3-Moderate Needs (3-4 Barriers Identified) Level 2-Minimal Needs (1-2 Barriers Identified) Level 3-Moderate Needs (3-4 Barriers Identified) Level 2-Minimal Needs (1-2 Barriers Identified)  Coordination of Care - - Appts Other  Education Method Written;Verbal Verbal Verbal -  Time Spent with Patient 90 90 [phone]

## 2021-04-10 NOTE — Patient Instructions (Addendum)
-  There are two main categories of lung cancer, they are named based on the size of the cancer cell. One is called Non-Small cell lung cancer. The other type is Small Cell Lung Cancer -The sample (biopsy) that they took of your tumor was consistent with Small Cell Lung Cancer -We covered a lot of important information at your appointment today regarding what the treatment plan is moving forward. Here are the the main points that were discussed at your office visit with [place] today:  -The treatment that you will receive consists of Two chemotherapy drugs, called Carboplatin and Etoposide and One Immunotherapy drug called Imfinzi (Durvalumab)  -We are planning on starting your treatment next week on 04/16/21 or 04/17/21 but before your start your treatment, I would like you to attend a Chemotherapy Education Class. This involves having you sit down with one of our nurse educators. She will discuss with your one-on-one more details about your treatment as well as general information about resources here at the [facility] treatment will be given for three consecutive days every 3 weeks. You would need to come back for an injection 2 days after your 3rd day of treatment. The purpose of this injection is to boost up your infection fighting cells so that they do not drop too low as a side effect of chemotherapy. We will check your labs once a week for the first ~4 treatments just to make sure that important components of your blood are in an acceptable range -We will get a CT scan after 2 treatments to check on the progress of treatment  Medications:  -I have sent a few important medication prescriptions to your pharmacy.  -Compazine was sent to your pharmacy. This medication is for nausea. You may take this every 6 hours as needed if you feel nausous. .   Side Effects:  -The adverse effect of this treatment including but not limited to alopecia (losing your hair), myelosuppression (drops in the blood  counts), nausea and vomiting, peripheral neuropathy (numbness and tingling in the hands and feet), liver or renal dysfunction.   Referrals:   Imaging:  -Please keep the appointment tomorrow as scheduled for the brain MRI  Follow up:  -We will see you back for a follow up visit in 2 weeks for evaluation and a 1 week follow up to manage any adverse side effects of treatment.

## 2021-04-11 ENCOUNTER — Other Ambulatory Visit: Payer: Self-pay | Admitting: *Deleted

## 2021-04-11 ENCOUNTER — Ambulatory Visit (HOSPITAL_COMMUNITY)
Admission: RE | Admit: 2021-04-11 | Discharge: 2021-04-11 | Disposition: A | Discharge status: Home / Self Care | Payer: Medicare Other | Source: Ambulatory Visit | Attending: Internal Medicine | Admitting: Internal Medicine

## 2021-04-11 DIAGNOSIS — C3490 Malignant neoplasm of unspecified part of unspecified bronchus or lung: Secondary | ICD-10-CM | POA: Insufficient documentation

## 2021-04-11 MED ORDER — GADOBUTROL 1 MMOL/ML IV SOLN
9.0000 mL | Freq: Once | INTRAVENOUS | Status: AC | PRN
  Administered 2021-04-11: 9 mL via INTRAVENOUS

## 2021-04-11 NOTE — Progress Notes (Signed)
Pharmacist Chemotherapy Monitoring - Initial Assessment    Anticipated start date: 04/17/21   The following has been reviewed per standard work regarding the patient's treatment regimen: The patient's diagnosis, treatment plan and drug doses, and organ/hematologic function Lab orders and baseline tests specific to treatment regimen  The treatment plan start date, drug sequencing, and pre-medications Prior authorization status  Patient's documented medication list, including drug-drug interaction screen and prescriptions for anti-emetics and supportive care specific to the treatment regimen The drug concentrations, fluid compatibility, administration routes, and timing of the medications to be used The patient's access for treatment and lifetime cumulative dose history, if applicable  The patient's medication allergies and previous infusion related reactions, if applicable   Changes made to treatment plan:  N/A  Follow up needed:  Pt needs appt for Day 1 infusion.  Cassie H eilingoetter, PA aware.    Kennith Center, Pharm.D., CPP 04/11/2021@4 :16 PM

## 2021-04-11 NOTE — Progress Notes (Signed)
The proposed treatment discussed in conference is for discussion purpose only and is not a binding recommendation. The patient was not been physically examined, or presented with their treatment options. Therefore, final treatment plans cannot be decided.  

## 2021-04-12 ENCOUNTER — Encounter: Payer: Self-pay | Admitting: *Deleted

## 2021-04-12 ENCOUNTER — Telehealth: Payer: Self-pay | Admitting: Internal Medicine

## 2021-04-12 NOTE — Telephone Encounter (Signed)
Scheduled per 01/11 los, called and spoke with patient's spouse. Patient will be notified.

## 2021-04-12 NOTE — Progress Notes (Signed)
Oncology Nurse Navigator Documentation  Oncology Nurse Navigator Flowsheets 04/12/2021 04/10/2021 04/04/2021 03/27/2021 03/26/2021  Abnormal Finding Date - - - - 03/01/2021  Confirmed Diagnosis Date 03/29/2021 - - - -  Diagnosis Status Confirmed Diagnosis Complete - - - -  Planned Course of Treatment Chemotherapy;Targeted Therapy - - - -  Phase of Treatment Targeted Therapy - - - -  Chemotherapy Actual Start Date: 04/17/2021 - - - -  Targeted Therapy Actual Start Date: 04/17/2021 - - - -  Navigator Follow Up Date: 05/07/2021 04/10/2021 04/05/2021 04/04/2021 03/28/2021  Navigator Follow Up Reason: Follow-up Appointment Follow-up Appointment Appointment Review;Patient Call Pathology Appointment Review  Navigator Location [facility]  Navigator Encounter Type Other: [facility] Telephone Appt/Treatment Plan Review [facility]  Telephone - - Outgoing Call - -  Treatment Initiated Date 04/17/2021 - - - -  Patient Visit Type Other MedOnc - Other MedOnc  Treatment Phase - Pre-Tx/Tx Discussion Pre-Tx/Tx Discussion Abnormal Scans -  Barriers/Navigation Needs Coordination of Care/I followed up on Mr. Mezera's appt. He is set up for chemo ed 1/16 and to start his first cycle on 1/18.  No barriers identified at this time.  Education Web designer of Care  Education - Newly Diagnosed Cancer Education;Understanding Cancer/ Treatment Options;Other Other Other -  Interventions Coordination of Care Education;Psycho-Social Support Coordination of Care;Education Coordination of Care;Education Coordination of Care  Acuity Level 2-Minimal Needs (1-2 Barriers Identified) Level 3-Moderate Needs (3-4 Barriers Identified) Level 2-Minimal Needs (1-2 Barriers Identified) Level 3-Moderate Needs (3-4 Barriers Identified) Level 2-Minimal Needs (1-2 Barriers Identified)  Coordination of Care Other - - Appts  Other  Education Method - Written;Verbal Verbal Verbal -  Time Spent with Patient 30 30 30  45 30

## 2021-04-14 ENCOUNTER — Encounter: Payer: Self-pay | Admitting: Internal Medicine

## 2021-04-15 ENCOUNTER — Other Ambulatory Visit: Payer: Self-pay

## 2021-04-15 ENCOUNTER — Other Ambulatory Visit: Payer: Medicare Other

## 2021-04-15 ENCOUNTER — Inpatient Hospital Stay: Payer: Medicare Other

## 2021-04-15 DIAGNOSIS — R5383 Other fatigue: Secondary | ICD-10-CM

## 2021-04-15 DIAGNOSIS — C3490 Malignant neoplasm of unspecified part of unspecified bronchus or lung: Secondary | ICD-10-CM

## 2021-04-15 DIAGNOSIS — Z5111 Encounter for antineoplastic chemotherapy: Secondary | ICD-10-CM | POA: Diagnosis not present

## 2021-04-15 LAB — CMP (CANCER CENTER ONLY)
ALT: 12 U/L (ref 0–44)
AST: 15 U/L (ref 15–41)
Albumin: 4.2 g/dL (ref 3.5–5.0)
Alkaline Phosphatase: 65 U/L (ref 38–126)
Anion gap: 6 (ref 5–15)
BUN: 22 mg/dL (ref 8–23)
CO2: 31 mmol/L (ref 22–32)
Calcium: 9.5 mg/dL (ref 8.9–10.3)
Chloride: 101 mmol/L (ref 98–111)
Creatinine: 1.17 mg/dL (ref 0.61–1.24)
GFR, Estimated: >60 mL/min (ref >60)
Glucose, Bld: 90 mg/dL (ref 70–99)
Potassium: 4.7 mmol/L (ref 3.5–5.1)
Sodium: 138 mmol/L (ref 135–145)
Total Bilirubin: 0.6 mg/dL (ref 0.3–1.2)
Total Protein: 7.4 g/dL (ref 6.5–8.1)

## 2021-04-15 LAB — TSH: TSH: 1.719 u[IU]/mL (ref 0.320–4.118)

## 2021-04-15 LAB — CBC WITH DIFFERENTIAL (CANCER CENTER ONLY)
Abs Immature Granulocytes: 0.02 10*3/uL (ref 0.00–0.07)
Basophils Absolute: 0.1 10*3/uL (ref 0.0–0.1)
Basophils Relative: 1 %
Eosinophils Absolute: 0.1 10*3/uL (ref 0.0–0.5)
Eosinophils Relative: 1 %
HCT: 48.3 % (ref 39.0–52.0)
Hemoglobin: 15.7 g/dL (ref 13.0–17.0)
Immature Granulocytes: 0 %
Lymphocytes Relative: 14 %
Lymphs Abs: 1.2 10*3/uL (ref 0.7–4.0)
MCH: 31 pg (ref 26.0–34.0)
MCHC: 32.5 g/dL (ref 30.0–36.0)
MCV: 95.5 fL (ref 80.0–100.0)
Monocytes Absolute: 0.7 10*3/uL (ref 0.1–1.0)
Monocytes Relative: 8 %
Neutro Abs: 6.5 10*3/uL (ref 1.7–7.7)
Neutrophils Relative %: 76 %
Platelet Count: 190 10*3/uL (ref 150–400)
RBC: 5.06 MIL/uL (ref 4.22–5.81)
RDW: 13.9 % (ref 11.5–15.5)
WBC Count: 8.7 10*3/uL (ref 4.0–10.5)
nRBC: 0 % (ref 0.0–0.2)

## 2021-04-15 MED FILL — Fosaprepitant Dimeglumine For IV Infusion 150 MG (Base Eq): INTRAVENOUS | Qty: 5 | Status: AC

## 2021-04-15 MED FILL — Dexamethasone Sodium Phosphate Inj 100 MG/10ML: INTRAMUSCULAR | Qty: 1 | Status: AC

## 2021-04-16 ENCOUNTER — Other Ambulatory Visit: Payer: Medicare Other

## 2021-04-16 ENCOUNTER — Inpatient Hospital Stay: Payer: Medicare Other

## 2021-04-16 ENCOUNTER — Ambulatory Visit: Payer: Medicare Other | Admitting: Physician Assistant

## 2021-04-16 ENCOUNTER — Ambulatory Visit (HOSPITAL_BASED_OUTPATIENT_CLINIC_OR_DEPARTMENT_OTHER): Payer: Medicare Other | Admitting: Physician Assistant

## 2021-04-16 ENCOUNTER — Other Ambulatory Visit: Payer: Self-pay | Admitting: Internal Medicine

## 2021-04-16 VITALS — BP 109/56 | HR 45 | Temp 98.0°F | Resp 18 | Wt 197.5 lb

## 2021-04-16 DIAGNOSIS — T7840XA Allergy, unspecified, initial encounter: Secondary | ICD-10-CM | POA: Diagnosis not present

## 2021-04-16 DIAGNOSIS — C3411 Malignant neoplasm of upper lobe, right bronchus or lung: Secondary | ICD-10-CM

## 2021-04-16 DIAGNOSIS — Z5111 Encounter for antineoplastic chemotherapy: Secondary | ICD-10-CM | POA: Diagnosis not present

## 2021-04-16 MED ORDER — FAMOTIDINE 20 MG IN NS 100 ML IVPB
20.0000 mg | Freq: Once | INTRAVENOUS | Status: AC | PRN
  Administered 2021-04-16: 20 mg via INTRAVENOUS

## 2021-04-16 MED ORDER — SODIUM CHLORIDE 0.9 % IV SOLN
1500.0000 mg | Freq: Once | INTRAVENOUS | Status: AC
  Administered 2021-04-16: 1500 mg via INTRAVENOUS
  Filled 2021-04-16: qty 30

## 2021-04-16 MED ORDER — SODIUM CHLORIDE 0.9 % IV SOLN
100.0000 mg/m2 | Freq: Once | INTRAVENOUS | Status: AC
  Administered 2021-04-16: 210 mg via INTRAVENOUS
  Filled 2021-04-16: qty 10.5

## 2021-04-16 MED ORDER — SODIUM CHLORIDE 0.9 % IV SOLN: Freq: Once | INTRAVENOUS | Status: AC

## 2021-04-16 MED ORDER — SODIUM CHLORIDE 0.9 % IV SOLN
10.0000 mg | Freq: Once | INTRAVENOUS | Status: AC
  Administered 2021-04-16: 10 mg via INTRAVENOUS
  Filled 2021-04-16: qty 10

## 2021-04-16 MED ORDER — SODIUM CHLORIDE 0.9 % IV SOLN
451.0000 mg | Freq: Once | INTRAVENOUS | Status: AC
  Administered 2021-04-16: 450 mg via INTRAVENOUS
  Filled 2021-04-16: qty 45

## 2021-04-16 MED ORDER — LIDOCAINE-PRILOCAINE 2.5-2.5 % EX CREA: TOPICAL_CREAM | CUTANEOUS | 0 refills | Status: DC

## 2021-04-16 MED ORDER — PALONOSETRON HCL INJECTION 0.25 MG/5ML
0.2500 mg | Freq: Once | INTRAVENOUS | Status: AC
  Administered 2021-04-16: 0.25 mg via INTRAVENOUS
  Filled 2021-04-16: qty 5

## 2021-04-16 MED ORDER — TRILACICLIB DIHYDROCHLORIDE INJECTION 300 MG
240.0000 mg/m2 | Freq: Once | INTRAVENOUS | Status: AC
  Administered 2021-04-16: 510 mg via INTRAVENOUS
  Filled 2021-04-16: qty 34

## 2021-04-16 MED ORDER — DIPHENHYDRAMINE HCL 50 MG/ML IJ SOLN
50.0000 mg | Freq: Once | INTRAMUSCULAR | Status: AC | PRN
  Administered 2021-04-16: 50 mg via INTRAVENOUS

## 2021-04-16 MED ORDER — SODIUM CHLORIDE 0.9 % IV SOLN
150.0000 mg | Freq: Once | INTRAVENOUS | Status: AC
  Administered 2021-04-16: 150 mg via INTRAVENOUS
  Filled 2021-04-16: qty 150

## 2021-04-16 MED FILL — Dexamethasone Sodium Phosphate Inj 100 MG/10ML: INTRAMUSCULAR | Qty: 1 | Status: AC

## 2021-04-16 NOTE — Patient Instructions (Signed)
[facility]  Discharge Instructions: Thank you for choosing [facility] to provide your oncology and hematology care.   If you have a lab appointment with the [facility], please go directly to the [facility] and check in at the registration area.   Wear comfortable clothing and clothing appropriate for easy access to any Portacath or PICC line.   We strive to give you quality time with your provider. You may need to reschedule your appointment if you arrive late (15 or more minutes).  Arriving late affects you and other patients whose appointments are after yours.  Also, if you miss three or more appointments without notifying the office, you may be dismissed from the clinic at the providers discretion.      For prescription refill requests, have your pharmacy contact our office and allow 72 hours for refills to be completed.    Today you received the following chemotherapy and/or immunotherapy agents : Cosela, Imfinzi, Carboplatin, Etoposide      To help prevent nausea and vomiting after your treatment, we encourage you to take your nausea medication as directed.  BELOW ARE SYMPTOMS THAT SHOULD BE REPORTED IMMEDIATELY: *FEVER GREATER THAN 100.4 F (38 C) OR HIGHER *CHILLS OR SWEATING *NAUSEA AND VOMITING THAT IS NOT CONTROLLED WITH YOUR NAUSEA MEDICATION *UNUSUAL SHORTNESS OF BREATH *UNUSUAL BRUISING OR BLEEDING *URINARY PROBLEMS (pain or burning when urinating, or frequent urination) *BOWEL PROBLEMS (unusual diarrhea, constipation, pain near the anus) TENDERNESS IN MOUTH AND THROAT WITH OR WITHOUT PRESENCE OF ULCERS (sore throat, sores in mouth, or a toothache) UNUSUAL RASH, SWELLING OR PAIN  UNUSUAL VAGINAL DISCHARGE OR ITCHING   Items with * indicate a potential emergency and should be followed up as soon as possible or go to the Emergency Department if any problems should occur.  Please show the CHEMOTHERAPY ALERT CARD or  IMMUNOTHERAPY ALERT CARD at check-in to the Emergency Department and triage nurse.  Should you have questions after your visit or need to cancel or reschedule your appointment, please contact [facility]  Dept: [phone]  and follow the prompts.  Office hours are 8:00 a.m. to 4:30 p.m. Monday - Friday. Please note that voicemails left after 4:00 p.m. may not be returned until the following business day.  We are closed weekends and major holidays. You have access to a nurse at all times for urgent questions. Please call the main number to the clinic Dept: [phone] and follow the prompts.   For any non-urgent questions, you may also contact your provider using MyChart. We now offer e-Visits for anyone 69 and older to request care online for non-urgent symptoms. For details visit [email or web address].   Also download the MyChart app! Go to the app store, search "MyChart", open the app, select [facility], and log in with your MyChart username and password.  Due to Covid, a mask is required upon entering the hospital/clinic. If you do not have a mask, one will be given to you upon arrival. For doctor visits, patients may have 1 support person aged 63 or older with them. For treatment visits, patients cannot have anyone with them due to current Covid guidelines and our immunocompromised population.   Trilaciclib Injection What is this medication? TRILACICLIB (trye la sye klib) decreases the risk of damage to your red blood cells, white blood cells, and platelets from chemotherapy. This medicine may be used for other purposes; ask your health care provider or pharmacist if you  have questions. COMMON BRAND NAME(S): COSELA What should I tell my care team before I take this medication? They need to know if you have any of these conditions: an unusual or allergic reaction to trilaciclib, other medicines, foods, dyes, or preservatives pregnant or trying to get  pregnant breast-feeding How should I use this medication? This medicine is injected into a vein. It is given by a health care provider in a hospital or clinic setting. Talk to your health care provider about the use of this medicine in children. Special care may be needed. Overdosage: If you think you have taken too much of this medicine contact a poison control center or emergency room at once. NOTE: This medicine is only for you. Do not share this medicine with others. What if I miss a dose? Keep appointments for follow-up doses. It is important to not miss your dose. Call your health care provider if you are unable to keep an appointment. What may interact with this medication? cisplatin dalfampridine dofetilide metformin This list may not describe all possible interactions. Give your health care provider a list of all the medicines, herbs, non-prescription drugs, or dietary supplements you use. Also tell them if you smoke, drink alcohol, or use illegal drugs. Some items may interact with your medicine. What should I watch for while using this medication? Your condition will be monitored carefully while you are receiving this medicine. Do not become pregnant while taking this medicine or for at least 3 weeks after stopping it. Women should inform their health care provider if they wish to become pregnant or think they might be pregnant. There is a potential for serious harm to an unborn child. Talk to your health care provider for more information. Do not breast-feed an infant while taking this medicine or for at least 3 weeks after stopping it. This medicine may make it more difficult to get pregnant. Talk to your health care provider if you are concerned about your fertility. What side effects may I notice from receiving this medication? Side effects that you should report to your doctor or health care professional as soon as possible: allergic reactions (skin rash; itching or hives;  swelling of the face, lips, or tongue) blood clot (chest pain; shortness of breath; pain, swelling, or warmth in the leg) cough pain, redness, or irritation at site where injected trouble breathing Side effects that usually do not require medical attention (report these to your doctor or health care professional if they continue or are bothersome): edema (sudden weight gain; swelling of the ankles, feet, hands or other unusual swelling) headache stomach pain unusually weak or tired This list may not describe all possible side effects. Call your doctor for medical advice about side effects. You may report side effects to FDA at 1-800-FDA-1088. Where should I keep my medication? This medicine is given in a hospital or clinic. It will not be stored at home. NOTE: This sheet is a summary. It may not cover all possible information. If you have questions about this medicine, talk to your doctor, pharmacist, or health care provider.  2022 Elsevier/Gold Standard (2020-12-04 00:00:00)  Durvalumab injection What is this medication? DURVALUMAB (dur VAL ue mab) is a monoclonal antibody. It is used to treat lung cancer. This medicine may be used for other purposes; ask your health care provider or pharmacist if you have questions. COMMON BRAND NAME(S): IMFINZI What should I tell my care team before I take this medication? They need to know if you have any  of these conditions: autoimmune diseases like Crohn's disease, ulcerative colitis, or lupus have had or planning to have an allogeneic stem cell transplant (uses someone else's stem cells) history of organ transplant history of radiation to the chest nervous system problems like myasthenia gravis or Guillain-Barre syndrome an unusual or allergic reaction to durvalumab, other medicines, foods, dyes, or preservatives pregnant or trying to get pregnant breast-feeding How should I use this medication? This medicine is for infusion into a vein. It is  given by a health care professional in a hospital or clinic setting. A special MedGuide will be given to you before each treatment. Be sure to read this information carefully each time. Talk to your pediatrician regarding the use of this medicine in children. Special care may be needed. Overdosage: If you think you have taken too much of this medicine contact a poison control center or emergency room at once. NOTE: This medicine is only for you. Do not share this medicine with others. What if I miss a dose? It is important not to miss your dose. Call your doctor or health care professional if you are unable to keep an appointment. What may interact with this medication? Interactions have not been studied. This list may not describe all possible interactions. Give your health care provider a list of all the medicines, herbs, non-prescription drugs, or dietary supplements you use. Also tell them if you smoke, drink alcohol, or use illegal drugs. Some items may interact with your medicine. What should I watch for while using this medication? This medication may make you feel generally unwell. Continue your course of treatment even though you feel ill unless your care team tells you to stop. You may need blood work done while you are taking this medication. Do not become pregnant while taking this medication or for 3 months after stopping it. Women should inform their care team if they wish to become pregnant or think they might be pregnant. There is a potential for serious side effects to an unborn child. Talk to your care team or pharmacist for more information. Do not breast-feed an infant while taking this medication or for 3 months after stopping it. What side effects may I notice from receiving this medication? Side effects that you should report to your care team as soon as possible: Allergic reactions--skin rash, itching, hives, swelling of the face, lips, tongue, or throat Bloody or watery  diarrhea Dizziness, loss of balance or coordination, confusion or trouble speaking Dry cough, shortness of breath or trouble breathing Flushing, mostly over the face, neck, and chest, during injection High blood sugar (hyperglycemia)--increased thirst or amount of urine, unusual weakness or fatigue, blurry vision High thyroid levels (hyperthyroidism)--fast or irregular heartbeat, weight loss, excessive sweating or sensitivity to heat, tremors or shaking, anxiety, nervousness, irregular menstrual cycle or spotting Infection--fever, chills, cough, or sore throat Liver injury--right upper belly pain, loss of appetite, nausea, light-colored stool, dark yellow or brown urine, yellowing skin or eyes, unusual weakness or fatigue Low adrenal gland function--nausea, vomiting, loss of appetite, unusual weakness or fatigue, dizziness, low blood pressure Low thyroid levels (hypothyroidism)--unusual weakness or fatigue, increased sensitivity to cold, constipation, hair loss, dry skin, weight gain, feelings of depression Pancreatitis--severe stomach pain that spreads to your back or gets worse after eating or when touched, fever, nausea, vomiting Rash, fever, and swollen lymph nodes Redness, blistering, peeling or loosening of the skin, including inside the mouth Wheezing--trouble breathing with loud or whistling sounds Side effects that usually do not  require medical attention (report these to your care team if they continue or are bothersome): Fatigue Hair loss This list may not describe all possible side effects. Call your doctor for medical advice about side effects. You may report side effects to FDA at 1-800-FDA-1088. Where should I keep my medication? This medication is given in a hospital or clinic. It will not be stored at home. NOTE: This sheet is a summary. It may not cover all possible information. If you have questions about this medicine, talk to your doctor, pharmacist, or health care  provider.  2022 Elsevier/Gold Standard (2020-12-04 00:00:00)  Carboplatin injection What is this medication? CARBOPLATIN (KAR boe pla tin) is a chemotherapy drug. It targets fast dividing cells, like cancer cells, and causes these cells to die. This medicine is used to treat ovarian cancer and many other cancers. This medicine may be used for other purposes; ask your health care provider or pharmacist if you have questions. COMMON BRAND NAME(S): Paraplatin What should I tell my care team before I take this medication? They need to know if you have any of these conditions: blood disorders hearing problems kidney disease recent or ongoing radiation therapy an unusual or allergic reaction to carboplatin, cisplatin, other chemotherapy, other medicines, foods, dyes, or preservatives pregnant or trying to get pregnant breast-feeding How should I use this medication? This drug is usually given as an infusion into a vein. It is administered in a hospital or clinic by a specially trained health care professional. Talk to your pediatrician regarding the use of this medicine in children. Special care may be needed. Overdosage: If you think you have taken too much of this medicine contact a poison control center or emergency room at once. NOTE: This medicine is only for you. Do not share this medicine with others. What if I miss a dose? It is important not to miss a dose. Call your doctor or health care professional if you are unable to keep an appointment. What may interact with this medication? medicines for seizures medicines to increase blood counts like filgrastim, pegfilgrastim, sargramostim some antibiotics like amikacin, gentamicin, neomycin, streptomycin, tobramycin vaccines Talk to your doctor or health care professional before taking any of these medicines: acetaminophen aspirin ibuprofen ketoprofen naproxen This list may not describe all possible interactions. Give your health  care provider a list of all the medicines, herbs, non-prescription drugs, or dietary supplements you use. Also tell them if you smoke, drink alcohol, or use illegal drugs. Some items may interact with your medicine. What should I watch for while using this medication? Your condition will be monitored carefully while you are receiving this medicine. You will need important blood work done while you are taking this medicine. This drug may make you feel generally unwell. This is not uncommon, as chemotherapy can affect healthy cells as well as cancer cells. Report any side effects. Continue your course of treatment even though you feel ill unless your doctor tells you to stop. In some cases, you may be given additional medicines to help with side effects. Follow all directions for their use. Call your doctor or health care professional for advice if you get a fever, chills or sore throat, or other symptoms of a cold or flu. Do not treat yourself. This drug decreases your body's ability to fight infections. Try to avoid being around people who are sick. This medicine may increase your risk to bruise or bleed. Call your doctor or health care professional if you notice any unusual  bleeding. Be careful brushing and flossing your teeth or using a toothpick because you may get an infection or bleed more easily. If you have any dental work done, tell your dentist you are receiving this medicine. Avoid taking products that contain aspirin, acetaminophen, ibuprofen, naproxen, or ketoprofen unless instructed by your doctor. These medicines may hide a fever. Do not become pregnant while taking this medicine. Women should inform their doctor if they wish to become pregnant or think they might be pregnant. There is a potential for serious side effects to an unborn child. Talk to your health care professional or pharmacist for more information. Do not breast-feed an infant while taking this medicine. What side effects may  I notice from receiving this medication? Side effects that you should report to your doctor or health care professional as soon as possible: allergic reactions like skin rash, itching or hives, swelling of the face, lips, or tongue signs of infection - fever or chills, cough, sore throat, pain or difficulty passing urine signs of decreased platelets or bleeding - bruising, pinpoint red spots on the skin, black, tarry stools, nosebleeds signs of decreased red blood cells - unusually weak or tired, fainting spells, lightheadedness breathing problems changes in hearing changes in vision chest pain high blood pressure low blood counts - This drug may decrease the number of white blood cells, red blood cells and platelets. You may be at increased risk for infections and bleeding. nausea and vomiting pain, swelling, redness or irritation at the injection site pain, tingling, numbness in the hands or feet problems with balance, talking, walking trouble passing urine or change in the amount of urine Side effects that usually do not require medical attention (report to your doctor or health care professional if they continue or are bothersome): hair loss loss of appetite metallic taste in the mouth or changes in taste This list may not describe all possible side effects. Call your doctor for medical advice about side effects. You may report side effects to FDA at 1-800-FDA-1088. Where should I keep my medication? This drug is given in a hospital or clinic and will not be stored at home. NOTE: This sheet is a summary. It may not cover all possible information. If you have questions about this medicine, talk to your doctor, pharmacist, or health care provider.  2022 Elsevier/Gold Standard (2007-08-25 00:00:00)  Etoposide, VP-16 capsules What is this medication? ETOPOSIDE, VP-16 (e toe POE side) is a chemotherapy drug. It is used to treat small cell lung cancer and other cancers. This medicine may  be used for other purposes; ask your health care provider or pharmacist if you have questions. COMMON BRAND NAME(S): VePesid What should I tell my care team before I take this medication? They need to know if you have any of these conditions: infection kidney disease liver disease low blood counts, like low white cell, platelet, or red cell counts an unusual or allergic reaction to etoposide, other medicines, foods, dyes, or preservatives pregnant or trying to get pregnant breast-feeding How should I use this medication? Take this medicine by mouth with a glass of water. Follow the directions on the prescription label. Do not open, crush, or chew the capsules. It is advisable to wear gloves when handling this medicine. Take your medicine at regular intervals. Do not take it more often than directed. Do not stop taking except on your doctor's advice. Talk to your pediatrician regarding the use of this medicine in children. Special care may be needed. Overdosage:  If you think you have taken too much of this medicine contact a poison control center or emergency room at once. NOTE: This medicine is only for you. Do not share this medicine with others. What if I miss a dose? If you miss a dose, take it as soon as you can. If it is almost time for your next dose, take only that dose. Do not take double or extra doses. What may interact with this medication? This medicine may interact with the following medications: cyclosporine warfarin This list may not describe all possible interactions. Give your health care provider a list of all the medicines, herbs, non-prescription drugs, or dietary supplements you use. Also tell them if you smoke, drink alcohol, or use illegal drugs. Some items may interact with your medicine. What should I watch for while using this medication? Visit your doctor for checks on your progress. This drug may make you feel generally unwell. This is not uncommon, as chemotherapy  can affect healthy cells as well as cancer cells. Report any side effects. Continue your course of treatment even though you feel ill unless your doctor tells you to stop. In some cases, you may be given additional medicines to help with side effects. Follow all directions for their use. Call your doctor or health care professional for advice if you get a fever, chills or sore throat, or other symptoms of a cold or flu. Do not treat yourself. This drug decreases your body's ability to fight infections. Try to avoid being around people who are sick. This medicine may increase your risk to bruise or bleed. Call your doctor or health care professional if you notice any unusual bleeding. Talk to your doctor about your risk of cancer. You may be more at risk for certain types of cancers if you take this medicine. Do not become pregnant while taking this medicine or for at least 6 months after stopping it. Women should inform their doctor if they wish to become pregnant or think they might be pregnant. Women of child-bearing potential will need to have a negative pregnancy test before starting this medicine. There is a potential for serious side effects to an unborn child. Talk to your health care professional or pharmacist for more information. Do not breast-feed an infant while taking this medicine. Men must use a latex condom during sexual contact with a woman while taking this medicine and for at least 4 months after stopping it. A latex condom is needed even if you have had a vasectomy. Contact your doctor right away if your partner becomes pregnant. Do not donate sperm while taking this medicine and for 4 months after you stop taking this medicine. Men should inform their doctors if they wish to father a child. This medicine may lower sperm counts. What side effects may I notice from receiving this medication? Side effects that you should report to your doctor or health care professional as soon as  possible: allergic reactions like skin rash, itching or hives, swelling of the face, lips, or tongue low blood counts - this medicine may decrease the number of white blood cells, red blood cells, and platelets. You may be at increased risk for infections and bleeding nausea, vomiting redness, blistering, peeling or loosening of the skin, including inside the mouth signs and symptoms of infection like fever; chills; cough; sore throat; pain or trouble passing urine signs and symptoms of low red blood cells or anemia such as unusually weak or tired; feeling faint or lightheaded;  falls; breathing problems unusual bruising or bleeding Side effects that usually do not require medical attention (report to your doctor or health care professional if they continue or are bothersome): changes in taste diarrhea hair loss loss of appetite mouth sores This list may not describe all possible side effects. Call your doctor for medical advice about side effects. You may report side effects to FDA at 1-800-FDA-1088. Where should I keep my medication? Keep out of the reach of children. Store in a refrigerator between 2 and 8 degrees C (36 and 46 degrees F). Do not freeze. Throw away any unused medicine after the expiration date. NOTE: This sheet is a summary. It may not cover all possible information. If you have questions about this medicine, talk to your doctor, pharmacist, or health care provider.  2022 Elsevier/Gold Standard (2020-12-04 00:00:00)

## 2021-04-16 NOTE — Progress Notes (Signed)
At 10:05 patient started scratching area around IV. Cosela was paused due to itchiness and redness around IV site. + blood return and patient asymptomatic otherwise. Anda Kraft from [facility] was at chairside and Benadryl and Pepcid were administered. Flare reaction continued at this time so an additional IV site was started. Reaction improved and Cosela administration was continued in the new right forearm IV site with no further reaction. Patient tolerated the rest of the infusion well with no other complaints or symptoms. Patient completed the rest of his treatment without any other concerns, tolerated well. The right forearm IV site returned to baseline prior to DC without any redness or itching. Per Dr. Julien Nordmann and pharmacy Benadryl and Pepcid have been added to day 2 and 3 prior to [facility] administration. Patient was [place] and ambulated to lobby.

## 2021-04-16 NOTE — Progress Notes (Addendum)
DATE:  04/16/21                                        X CHEMO/IMMUNOTHERAPY REACTION           MD: Julien Nordmann   AGENT/BLOOD PRODUCTS RECEIVING TODAY:              Cosela, durvalumab, carboplatin, etoposide   AGENT/BLOOD PRODUCT RECEIVING IMMEDIATELY PRIOR TO REACTION:          Cosela   VS: BP:     106/68   P:       78       SPO2:       95% on room air                BP:     118/62   P:       79       SPO2:       98% on room air     REACTION(S):           pruritis    PREMEDS:     aloxi 0.25 mg, decadron 10 mg, emend 150 mg   INTERVENTION: Pepcid 20 mg, benadryl 50 mg   Review of Systems  Review of Systems  Constitutional:  Negative for chills, diaphoresis and fatigue.  HENT:  Negative for facial swelling.   Eyes:  Negative for visual disturbance.  Respiratory:  Negative for cough, choking, chest tightness, shortness of breath, wheezing and stridor.   Cardiovascular:  Negative for chest pain and palpitations.  Gastrointestinal:  Negative for nausea and vomiting.  Musculoskeletal:  Negative for arthralgias and myalgias.  Skin:  Positive for color change.  Neurological:  Negative for dizziness, seizures, numbness and headaches.    Physical Exam  Physical Exam Vitals and nursing note reviewed.  Constitutional:      Appearance: He is well-developed. He is not ill-appearing or toxic-appearing.  HENT:     Head: Normocephalic and atraumatic.     Nose: Nose normal.  Eyes:     General: No scleral icterus.       Right eye: No discharge.        Left eye: No discharge.     Conjunctiva/sclera: Conjunctivae normal.  Neck:     Vascular: No JVD.  Cardiovascular:     Rate and Rhythm: Normal rate and regular rhythm.     Pulses: Normal pulses.     Heart sounds: Normal heart sounds.  Pulmonary:     Effort: Pulmonary effort is normal.     Breath sounds: Normal breath sounds.  Abdominal:     General: There is no distension.  Musculoskeletal:        General: Normal range of  motion.     Cervical back: Normal range of motion.  Skin:    General: Skin is warm and dry.     Comments: Erythema and hives on right forearm  Neurological:     Mental Status: He is oriented to person, place, and time.     GCS: GCS eye subscore is 4. GCS verbal subscore is 5. GCS motor subscore is 6.     Comments: Fluent speech, no facial droop.  Psychiatric:        Behavior: Behavior normal.    OUTCOME:                Patient symptomatic with  pruritus and erythema on right forearm 6 minutes into first time Cosela treatment. Symptoms consistent with IV flare vs thrombophlebitis. Patient received benadryl and Pepcid with symptom improvement, pruritis resolved. Discussed with Dr. Julien Nordmann who recommended trying new IV site on left arm arm. Patient tolerated retrial without further symptoms.

## 2021-04-17 ENCOUNTER — Other Ambulatory Visit: Payer: Self-pay

## 2021-04-17 ENCOUNTER — Encounter: Payer: Self-pay | Admitting: Internal Medicine

## 2021-04-17 ENCOUNTER — Inpatient Hospital Stay: Payer: Medicare Other

## 2021-04-17 VITALS — BP 96/76 | HR 74 | Temp 98.0°F | Resp 18

## 2021-04-17 DIAGNOSIS — C3411 Malignant neoplasm of upper lobe, right bronchus or lung: Secondary | ICD-10-CM

## 2021-04-17 DIAGNOSIS — Z5111 Encounter for antineoplastic chemotherapy: Secondary | ICD-10-CM | POA: Diagnosis not present

## 2021-04-17 MED ORDER — FAMOTIDINE 20 MG IN NS 100 ML IVPB
20.0000 mg | Freq: Once | INTRAVENOUS | Status: AC
  Administered 2021-04-17: 20 mg via INTRAVENOUS
  Filled 2021-04-17: qty 100

## 2021-04-17 MED ORDER — SODIUM CHLORIDE 0.9 % IV SOLN
100.0000 mg/m2 | Freq: Once | INTRAVENOUS | Status: AC
  Administered 2021-04-17: 210 mg via INTRAVENOUS
  Filled 2021-04-17: qty 10.5

## 2021-04-17 MED ORDER — SODIUM CHLORIDE 0.9 % IV SOLN
10.0000 mg | Freq: Once | INTRAVENOUS | Status: AC
  Administered 2021-04-17: 10 mg via INTRAVENOUS
  Filled 2021-04-17: qty 10

## 2021-04-17 MED ORDER — DIPHENHYDRAMINE HCL 50 MG/ML IJ SOLN
25.0000 mg | Freq: Once | INTRAMUSCULAR | Status: AC
  Administered 2021-04-17: 25 mg via INTRAVENOUS
  Filled 2021-04-17: qty 1

## 2021-04-17 MED ORDER — SODIUM CHLORIDE 0.9 % IV SOLN: Freq: Once | INTRAVENOUS | Status: AC

## 2021-04-17 MED ORDER — TRILACICLIB DIHYDROCHLORIDE INJECTION 300 MG
240.0000 mg/m2 | Freq: Once | INTRAVENOUS | Status: AC
  Administered 2021-04-17: 510 mg via INTRAVENOUS
  Filled 2021-04-17: qty 34

## 2021-04-17 MED FILL — Dexamethasone Sodium Phosphate Inj 100 MG/10ML: INTRAMUSCULAR | Qty: 1 | Status: AC

## 2021-04-17 NOTE — Patient Instructions (Signed)
[facility]  Discharge Instructions: Thank you for choosing [facility] to provide your oncology and hematology care.   If you have a lab appointment with the [facility], please go directly to the [facility] and check in at the registration area.   Wear comfortable clothing and clothing appropriate for easy access to any Portacath or PICC line.   We strive to give you quality time with your provider. You may need to reschedule your appointment if you arrive late (15 or more minutes).  Arriving late affects you and other patients whose appointments are after yours.  Also, if you miss three or more appointments without notifying the office, you may be dismissed from the clinic at the providers discretion.      For prescription refill requests, have your pharmacy contact our office and allow 72 hours for refills to be completed.    Today you received the following chemotherapy and/or immunotherapy agents Etoposide      To help prevent nausea and vomiting after your treatment, we encourage you to take your nausea medication as directed.  BELOW ARE SYMPTOMS THAT SHOULD BE REPORTED IMMEDIATELY: *FEVER GREATER THAN 100.4 F (38 C) OR HIGHER *CHILLS OR SWEATING *NAUSEA AND VOMITING THAT IS NOT CONTROLLED WITH YOUR NAUSEA MEDICATION *UNUSUAL SHORTNESS OF BREATH *UNUSUAL BRUISING OR BLEEDING *URINARY PROBLEMS (pain or burning when urinating, or frequent urination) *BOWEL PROBLEMS (unusual diarrhea, constipation, pain near the anus) TENDERNESS IN MOUTH AND THROAT WITH OR WITHOUT PRESENCE OF ULCERS (sore throat, sores in mouth, or a toothache) UNUSUAL RASH, SWELLING OR PAIN  UNUSUAL VAGINAL DISCHARGE OR ITCHING   Items with * indicate a potential emergency and should be followed up as soon as possible or go to the Emergency Department if any problems should occur.  Please show the CHEMOTHERAPY ALERT CARD or IMMUNOTHERAPY ALERT CARD at check-in to  the Emergency Department and triage nurse.  Should you have questions after your visit or need to cancel or reschedule your appointment, please contact [facility]  Dept: [phone]  and follow the prompts.  Office hours are 8:00 a.m. to 4:30 p.m. Monday - Friday. Please note that voicemails left after 4:00 p.m. may not be returned until the following business day.  We are closed weekends and major holidays. You have access to a nurse at all times for urgent questions. Please call the main number to the clinic Dept: [phone] and follow the prompts.   For any non-urgent questions, you may also contact your provider using MyChart. We now offer e-Visits for anyone 69 and older to request care online for non-urgent symptoms. For details visit [email or web address].   Also download the MyChart app! Go to the app store, search "MyChart", open the app, select [facility], and log in with your MyChart username and password.  Due to Covid, a mask is required upon entering the hospital/clinic. If you do not have a mask, one will be given to you upon arrival. For doctor visits, patients may have 1 support person aged 59 or older with them. For treatment visits, patients cannot have anyone with them due to current Covid guidelines and our immunocompromised population.

## 2021-04-18 ENCOUNTER — Other Ambulatory Visit: Payer: Self-pay | Admitting: Physician Assistant

## 2021-04-18 ENCOUNTER — Encounter: Payer: Self-pay | Admitting: Family Medicine

## 2021-04-18 ENCOUNTER — Inpatient Hospital Stay: Payer: Medicare Other

## 2021-04-18 VITALS — BP 130/56 | HR 45 | Temp 98.4°F | Resp 18

## 2021-04-18 DIAGNOSIS — C3411 Malignant neoplasm of upper lobe, right bronchus or lung: Secondary | ICD-10-CM

## 2021-04-18 DIAGNOSIS — Z5111 Encounter for antineoplastic chemotherapy: Secondary | ICD-10-CM | POA: Diagnosis not present

## 2021-04-18 MED ORDER — TRILACICLIB DIHYDROCHLORIDE INJECTION 300 MG
240.0000 mg/m2 | Freq: Once | INTRAVENOUS | Status: AC
  Administered 2021-04-18: 510 mg via INTRAVENOUS
  Filled 2021-04-18: qty 34

## 2021-04-18 MED ORDER — SODIUM CHLORIDE 0.9 % IV SOLN
100.0000 mg/m2 | Freq: Once | INTRAVENOUS | Status: AC
  Administered 2021-04-18: 210 mg via INTRAVENOUS
  Filled 2021-04-18: qty 10.5

## 2021-04-18 MED ORDER — SODIUM CHLORIDE 0.9 % IV SOLN
10.0000 mg | Freq: Once | INTRAVENOUS | Status: AC
  Administered 2021-04-18: 10 mg via INTRAVENOUS
  Filled 2021-04-18: qty 10

## 2021-04-18 MED ORDER — FAMOTIDINE 20 MG IN NS 100 ML IVPB
20.0000 mg | Freq: Once | INTRAVENOUS | Status: AC
  Administered 2021-04-18: 20 mg via INTRAVENOUS
  Filled 2021-04-18: qty 100

## 2021-04-18 MED ORDER — DIPHENHYDRAMINE HCL 50 MG/ML IJ SOLN
25.0000 mg | Freq: Once | INTRAMUSCULAR | Status: AC
  Administered 2021-04-18: 25 mg via INTRAVENOUS
  Filled 2021-04-18: qty 1

## 2021-04-18 MED ORDER — SODIUM CHLORIDE 0.9 % IV SOLN: Freq: Once | INTRAVENOUS | Status: AC

## 2021-04-18 MED ORDER — TRILACICLIB DIHYDROCHLORIDE INJECTION 300 MG: 240.0000 mg/m2 | Freq: Once | INTRAVENOUS | Status: DC

## 2021-04-18 NOTE — Patient Instructions (Signed)
[facility]  Discharge Instructions: Thank you for choosing [facility] to provide your oncology and hematology care.   If you have a lab appointment with the Cancer Center, please go directly to the Cancer Center and check in at the registration area.   Wear comfortable clothing and clothing appropriate for easy access to any Portacath or PICC line.   We strive to give you quality time with your provider. You may need to reschedule your appointment if you arrive late (15 or more minutes).  Arriving late affects you and other patients whose appointments are after yours.  Also, if you miss three or more appointments without notifying the office, you may be dismissed from the clinic at the provider's discretion.      For prescription refill requests, have your pharmacy contact our office and allow 72 hours for refills to be completed.    Today you received the following chemotherapy and/or immunotherapy agents: etoposide.     To help prevent nausea and vomiting after your treatment, we encourage you to take your nausea medication as directed.  BELOW ARE SYMPTOMS THAT SHOULD BE REPORTED IMMEDIATELY: . *FEVER GREATER THAN 100.4 F (38 C) OR HIGHER . *CHILLS OR SWEATING . *NAUSEA AND VOMITING THAT IS NOT CONTROLLED WITH YOUR NAUSEA MEDICATION . *UNUSUAL SHORTNESS OF BREATH . *UNUSUAL BRUISING OR BLEEDING . *URINARY PROBLEMS (pain or burning when urinating, or frequent urination) . *BOWEL PROBLEMS (unusual diarrhea, constipation, pain near the anus) . TENDERNESS IN MOUTH AND THROAT WITH OR WITHOUT PRESENCE OF ULCERS (sore throat, sores in mouth, or a toothache) . UNUSUAL RASH, SWELLING OR PAIN  . UNUSUAL VAGINAL DISCHARGE OR ITCHING   Items with * indicate a potential emergency and should be followed up as soon as possible or go to the Emergency Department if any problems should occur.  Please show the CHEMOTHERAPY ALERT CARD or IMMUNOTHERAPY ALERT  CARD at check-in to the Emergency Department and triage nurse.  Should you have questions after your visit or need to cancel or reschedule your appointment, please contact [facility]  Dept: [phone]  and follow the prompts.  Office hours are 8:00 a.m. to 4:30 p.m. Monday - Friday. Please note that voicemails left after 4:00 p.m. may not be returned until the following business day.  We are closed weekends and major holidays. You have access to a nurse at all times for urgent questions. Please call the main number to the clinic Dept: [phone] and follow the prompts.   For any non-urgent questions, you may also contact your provider using MyChart. We now offer e-Visits for anyone 18 and older to request care online for non-urgent symptoms. For details visit [email or web address].   Also download the MyChart app! Go to the app store, search "MyChart", open the app, select [facility], and log in with your MyChart username and password.  Due to Covid, a mask is required upon entering the hospital/clinic. If you do not have a mask, one will be given to you upon arrival. For doctor visits, patients may have 1 support person aged 18 or older with them. For treatment visits, patients cannot have anyone with them due to current Covid guidelines and our immunocompromised population.   

## 2021-04-18 NOTE — Telephone Encounter (Signed)
Please schedule. Wife needs to be seen also for UTI symptoms.  Ok to use slot for tomorrow and double book into one slot if needed.  Thanks!

## 2021-04-18 NOTE — Telephone Encounter (Signed)
Both have been scheduled.

## 2021-04-19 ENCOUNTER — Ambulatory Visit (INDEPENDENT_AMBULATORY_CARE_PROVIDER_SITE_OTHER): Payer: Medicare Other | Admitting: Family Medicine

## 2021-04-19 ENCOUNTER — Other Ambulatory Visit: Payer: Self-pay

## 2021-04-19 ENCOUNTER — Encounter: Payer: Self-pay | Admitting: Family Medicine

## 2021-04-19 ENCOUNTER — Encounter: Payer: Self-pay | Admitting: Internal Medicine

## 2021-04-19 DIAGNOSIS — L89302 Pressure ulcer of unspecified buttock, stage 2: Secondary | ICD-10-CM | POA: Diagnosis not present

## 2021-04-19 NOTE — Patient Instructions (Signed)
Continue ointment to areas.  Try to limit pressure to areas with repositioning, standing, etc.   You can try a gel seating pad/gel overlay in your chair

## 2021-04-21 DIAGNOSIS — L8992 Pressure ulcer of unspecified site, stage 2: Secondary | ICD-10-CM | POA: Insufficient documentation

## 2021-04-21 NOTE — Progress Notes (Signed)
Gerald [name] - 81 y.o. male MRN [id number]  Date of birth: 07-11-1940  Subjective Chief Complaint  Patient presents with   Follow-up    HPI Gerald [name] is an 81 year old male here today for follow-up of irritation around anal area.  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  Area that he was seen for previously has nearly healed.  He does have a couple new areas with some perianal irritation.  There has been no drainage or bleeding from these areas.  They are using zinc oxide ointment on these areas with improvement.  ROS:  A comprehensive ROS was completed and negative except as noted per HPI  Allergies  Allergen Reactions   Cosela [Trilaciclib] Other (See Comments)    thrombophlebitis    Past Medical History:  Diagnosis Date   Bradycardia    Bursitis    Carotid artery disease (HCC)    COPD (chronic obstructive pulmonary disease) (HCC)    Coronary artery disease    CTO mid-dist CX (fills from left-to-left collaterals), 99% OM2, 30% ost-dist LM, 30% prox-mid LAD, 30% ost-mid CX, 99% prox RCA (too small for PCI), medical management 02/09/20   Hypercholesterolemia    Ischemic cardiomyopathy    Lung cancer ([place])    Lung nodule    nscl ca dx'd 08/2019   Psoriasis    Tobacco abuse    Wears glasses    reading    Past Surgical History:  Procedure Laterality Date   BRONCHIAL BIOPSY  09/27/2019   Procedure: BRONCHIAL BIOPSIES;  Surgeon: Garner Nash, DO;  Location: [facility];  Service: Pulmonary;;   BRONCHIAL BRUSHINGS  09/27/2019   Procedure: BRONCHIAL BRUSHINGS;  Surgeon: Garner Nash, DO;  Location: [facility];  Service: Pulmonary;;   BRONCHIAL NEEDLE ASPIRATION BIOPSY  09/27/2019   Procedure: BRONCHIAL NEEDLE ASPIRATION BIOPSIES;  Surgeon: Garner Nash, DO;  Location: [facility];  Service: Pulmonary;;   BRONCHIAL NEEDLE ASPIRATION BIOPSY  03/29/2021   Procedure: BRONCHIAL NEEDLE  ASPIRATION BIOPSIES;  Surgeon: Garner Nash, DO;  Location: [facility];  Service: Pulmonary;;   BRONCHIAL WASHINGS  09/27/2019   Procedure: BRONCHIAL WASHINGS;  Surgeon: Garner Nash, DO;  Location: [facility];  Service: Pulmonary;;   BRONCHIAL WASHINGS  03/29/2021   Procedure: BRONCHIAL WASHINGS;  Surgeon: Garner Nash, DO;  Location: [facility];  Service: Pulmonary;;   COLONOSCOPY     CRYOTHERAPY  09/27/2019   Procedure: Cydney Ok;  Surgeon: Garner Nash, DO;  Location: [facility];  Service: Pulmonary;;   CRYOTHERAPY  03/29/2021   Procedure: CRYOTHERAPY;  Surgeon: Garner Nash, DO;  Location: [facility];  Service: Pulmonary;;   FIDUCIAL MARKER PLACEMENT  09/27/2019   Procedure: FIDUCIAL MARKER PLACEMENT;  Surgeon: Garner Nash, DO;  Location: [facility];  Service: Pulmonary;;   HEMOSTASIS CONTROL  09/27/2019   Procedure: HEMOSTASIS CONTROL;  Surgeon: Garner Nash, DO;  Location: [facility];  Service: Pulmonary;;   HERNIA REPAIR     umbicial- no longer as a "belly button"   RIGHT/LEFT HEART CATH AND CORONARY ANGIOGRAPHY N/A 02/09/2020   Procedure: RIGHT/LEFT HEART CATH AND CORONARY ANGIOGRAPHY;  Surgeon: Burnell Blanks, MD;  Location: [facility];  Service: Cardiovascular;  Laterality: N/A;   VIDEO BRONCHOSCOPY WITH ENDOBRONCHIAL NAVIGATION N/A 09/27/2019   Procedure: VIDEO BRONCHOSCOPY WITH ENDOBRONCHIAL NAVIGATION;  Surgeon: Garner Nash, DO;  Location: [facility];  Service: Pulmonary;  Laterality: N/A;  VIDEO BRONCHOSCOPY WITH ENDOBRONCHIAL ULTRASOUND N/A 03/29/2021   Procedure: VIDEO BRONCHOSCOPY WITH ENDOBRONCHIAL ULTRASOUND;  Surgeon: Garner Nash, DO;  Location: [facility];  Service: Pulmonary;  Laterality: N/A;    Social History   Socioeconomic History   Marital status: Married    Spouse name: Not on file   Number of children: Not on file   Years of education: Not on file   Highest education level: Not on  file  Occupational History   Not on file  Tobacco Use   Smoking status: Every Day    Packs/day: 2.00    Years: 62.00    Pack years: 124.00    Types: Cigarettes   Smokeless tobacco: Never  Vaping Use   Vaping Use: Never used  Substance and Sexual Activity   Alcohol use: Not Currently    Comment: rare   Drug use: Never   Sexual activity: Not Currently  Other Topics Concern   Not on file  Social History Narrative   Not on file   Social Determinants of Health   Financial Resource Strain: Not on file  Food Insecurity: Not on file  Transportation Needs: Not on file  Physical Activity: Not on file  Stress: Not on file  Social Connections: Not on file    Family History  Problem Relation Age of Onset   Heart attack Father    Mesothelioma Sister 32   Lung cancer Brother     Health Maintenance  Topic Date Due   TETANUS/TDAP  Never done   Zoster Vaccines- Shingrix (1 of 2) Never done   Pneumonia Vaccine 47+ Years old (2 - PPSV23 if available, else PCV20) 03/06/2016   COVID-19 Vaccine (3 - Moderna risk series) 06/22/2019   INFLUENZA VACCINE  Completed   HPV VACCINES  Aged Out     ----------------------------------------------------------------------------------------------------------------------------------------------------------------------------------------------------------------- Physical Exam BP (!) 116/95 (BP Location: Left Arm, Patient Position: Sitting, Cuff Size: Large)    Pulse (!) 109    Temp 98 F (36.7 C)    Ht 6' (1.829 m)    Wt 209 lb 6.4 oz (95 kg)    SpO2 94%    BMI 28.40 kg/m   Physical Exam Constitutional:      Appearance: Normal appearance.  Eyes:     General: No scleral icterus. Cardiovascular:     Rate and Rhythm: Normal rate and regular rhythm.  Pulmonary:     Effort: Pulmonary effort is normal.     Breath sounds: Normal breath sounds.  Musculoskeletal:     Cervical back: Neck supple.  Skin:    Comments: Small ulcerations limited to  skin breakdown along the mid gluteal fold bilaterally.  Neurological:     General: No focal deficit present.     Mental Status: He is alert.  Psychiatric:        Mood and Affect: Mood normal.        Behavior: Behavior normal.    ------------------------------------------------------------------------------------------------------------------------------------------------------------------------------------------------------------------- Assessment and Plan  Pressure ulcer, stage 2 (HCC) Small ulcerations in the buttock area.  Discussed trying to offload this area with cushioning and or gel pads.  Continue use of barrier creams.   No orders of the defined types were placed in this encounter.   Return in about 6 months (around 10/17/2021) for Wound f/u.    This visit occurred during the SARS-CoV-2 public health emergency.  Safety protocols were in place, including screening questions prior to the visit, additional usage of staff PPE, and extensive cleaning of exam room while  observing appropriate contact time as indicated for disinfecting solutions.

## 2021-04-21 NOTE — Assessment & Plan Note (Signed)
Small ulcerations in the buttock area.  Discussed trying to offload this area with cushioning and or gel pads.  Continue use of barrier creams.

## 2021-04-22 ENCOUNTER — Encounter: Payer: Self-pay | Admitting: Internal Medicine

## 2021-04-22 ENCOUNTER — Inpatient Hospital Stay: Payer: Medicare Other

## 2021-04-22 ENCOUNTER — Other Ambulatory Visit: Payer: Self-pay

## 2021-04-22 ENCOUNTER — Encounter: Payer: Self-pay | Admitting: *Deleted

## 2021-04-22 ENCOUNTER — Inpatient Hospital Stay (HOSPITAL_BASED_OUTPATIENT_CLINIC_OR_DEPARTMENT_OTHER): Payer: Medicare Other | Admitting: Internal Medicine

## 2021-04-22 VITALS — BP 119/62 | HR 82 | Temp 97.6°F | Resp 20 | Ht 72.0 in | Wt 200.2 lb

## 2021-04-22 DIAGNOSIS — C3411 Malignant neoplasm of upper lobe, right bronchus or lung: Secondary | ICD-10-CM

## 2021-04-22 DIAGNOSIS — A419 Sepsis, unspecified organism: Secondary | ICD-10-CM | POA: Diagnosis not present

## 2021-04-22 DIAGNOSIS — C3490 Malignant neoplasm of unspecified part of unspecified bronchus or lung: Secondary | ICD-10-CM

## 2021-04-22 DIAGNOSIS — Z5112 Encounter for antineoplastic immunotherapy: Secondary | ICD-10-CM

## 2021-04-22 DIAGNOSIS — R509 Fever, unspecified: Secondary | ICD-10-CM | POA: Diagnosis not present

## 2021-04-22 DIAGNOSIS — Z5111 Encounter for antineoplastic chemotherapy: Secondary | ICD-10-CM

## 2021-04-22 LAB — CBC WITH DIFFERENTIAL (CANCER CENTER ONLY)
Abs Immature Granulocytes: 0.11 10*3/uL — ABNORMAL HIGH (ref 0.00–0.07)
Basophils Absolute: 0 10*3/uL (ref 0.0–0.1)
Basophils Relative: 0 %
Eosinophils Absolute: 0.1 10*3/uL (ref 0.0–0.5)
Eosinophils Relative: 1 %
HCT: 42.7 % (ref 39.0–52.0)
Hemoglobin: 14.4 g/dL (ref 13.0–17.0)
Immature Granulocytes: 1 %
Lymphocytes Relative: 9 %
Lymphs Abs: 0.8 10*3/uL (ref 0.7–4.0)
MCH: 31.6 pg (ref 26.0–34.0)
MCHC: 33.7 g/dL (ref 30.0–36.0)
MCV: 93.6 fL (ref 80.0–100.0)
Monocytes Absolute: 0.1 10*3/uL (ref 0.1–1.0)
Monocytes Relative: 1 %
Neutro Abs: 7.9 10*3/uL — ABNORMAL HIGH (ref 1.7–7.7)
Neutrophils Relative %: 88 %
Platelet Count: 142 10*3/uL — ABNORMAL LOW (ref 150–400)
RBC: 4.56 MIL/uL (ref 4.22–5.81)
RDW: 13.7 % (ref 11.5–15.5)
Smear Review: NORMAL
WBC Count: 9 10*3/uL (ref 4.0–10.5)
nRBC: 0 % (ref 0.0–0.2)

## 2021-04-22 LAB — CMP (CANCER CENTER ONLY)
ALT: 19 U/L (ref 0–44)
AST: 18 U/L (ref 15–41)
Albumin: 4.1 g/dL (ref 3.5–5.0)
Alkaline Phosphatase: 57 U/L (ref 38–126)
Anion gap: 8 (ref 5–15)
BUN: 24 mg/dL — ABNORMAL HIGH (ref 8–23)
CO2: 30 mmol/L (ref 22–32)
Calcium: 9.2 mg/dL (ref 8.9–10.3)
Chloride: 99 mmol/L (ref 98–111)
Creatinine: 1.13 mg/dL (ref 0.61–1.24)
GFR, Estimated: >60 mL/min (ref >60)
Glucose, Bld: 97 mg/dL (ref 70–99)
Potassium: 4.5 mmol/L (ref 3.5–5.1)
Sodium: 137 mmol/L (ref 135–145)
Total Bilirubin: 1.2 mg/dL (ref 0.3–1.2)
Total Protein: 7.2 g/dL (ref 6.5–8.1)

## 2021-04-22 NOTE — Progress Notes (Signed)
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  The patient has evidence for disease recurrence and metastasis in December 2022.  PRIOR THERAPY: SBRT to the right upper lobe pulmonary nodule as well as the right lower lobe pulmonary nodule under the care of Dr. Sondra Come completed on 11/14/2019.   CURRENT THERAPY: Palliative systemic chemotherapy with carboplatin for an AUC of 5, etoposide 100 mg/m2, and Imfinzi 1500 mg IV every 3 weeks with Cosela.  First dose expected on 04/16/21.   INTERVAL HISTORY: Gerald [name] 81 y.o. male returns to the clinic today for follow-up visit.  The patient is feeling fine today with no concerning complaints.  He tolerated the first cycle of his systemic chemotherapy with carboplatin, etoposide, Cosela and Imfinzi fairly well.  He denied having any current fever or chills.  He has no nausea, vomiting, diarrhea or constipation.  He denied having any headache or visual changes.  He has no significant weight loss or night sweats.  He is here today for evaluation and repeat blood work.   MEDICAL HISTORY: Past Medical History:  Diagnosis Date   Bradycardia    Bursitis    Carotid artery disease (HCC)    COPD (chronic obstructive pulmonary disease) (HCC)    Coronary artery disease    CTO mid-dist CX (fills from left-to-left collaterals), 99% OM2, 30% ost-dist LM, 30% prox-mid LAD, 30% ost-mid CX, 99% prox RCA (too small for PCI), medical management 02/09/20   Hypercholesterolemia    Ischemic cardiomyopathy    Lung cancer ([facility])    Lung nodule    nscl  ca dx'd 08/2019   Psoriasis    Tobacco abuse    Wears glasses    reading    ALLERGIES:  is allergic to cosela [trilaciclib].  MEDICATIONS:  Current Outpatient Medications  Medication Sig Dispense Refill   acitretin (SORIATANE) 25 MG capsule Take 25 mg by mouth in the morning.     allopurinol (ZYLOPRIM) 100 MG tablet Take 100 mg by mouth in the morning.     aspirin EC 81 MG tablet Take 1 tablet (81 mg total) by mouth daily. Swallow whole. 90 tablet 3   clobetasol cream (TEMOVATE) 6.54 % Apply 1 application topically daily as needed (Psoriasis).     colchicine 0.6 MG tablet Take 1.2mg  initially followed by 0.6mg  in 1 hour.  Continue 0.6mg  daily until improvement. (Patient taking differently: Take 0.6-1.2 mg by mouth See admin instructions. Take 1.2mg  initially followed by 0.6mg  in 1 hour.  Continue 0.6mg  daily until improvement.) 20 tablet 1   Fluticasone-Umeclidin-Vilant (TRELEGY ELLIPTA) 100-62.5-25 MCG/ACT AEPB Inhale into the lungs.     lidocaine-prilocaine (EMLA) cream Apply to the Port-A-Cath site 30-60-minute before chemotherapy. 30 g 0   Multiple Vitamins-Minerals (PRESERVISION AREDS 2 PO) Take 1 tablet by mouth in the morning and at bedtime.     niacinamide 500 MG tablet Take 500 mg by mouth daily with breakfast.     Omega-3 Fatty Acids (FISH OIL) 1200 MG CAPS Take 1,200 mg by mouth in the morning.     prochlorperazine (COMPAZINE) 10 MG tablet Take 1  tablet (10 mg total) by mouth every 6 (six) hours as needed. 30 tablet 2   rosuvastatin (CRESTOR) 20 MG tablet Take 1 tablet by mouth once daily (Patient taking differently: Take 20 mg by mouth every evening.) 90 tablet 3   triamcinolone cream (KENALOG) 0.1 % Apply 1 application topically daily as needed (skin irritation.).     vitamin B-12 (CYANOCOBALAMIN) 500 MCG tablet Take 500 mcg by mouth every evening.     No current facility-administered medications for this visit.    SURGICAL HISTORY:  Past Surgical History:  Procedure  Laterality Date   BRONCHIAL BIOPSY  09/27/2019   Procedure: BRONCHIAL BIOPSIES;  Surgeon: Garner Nash, DO;  Location: [facility];  Service: Pulmonary;;   BRONCHIAL BRUSHINGS  09/27/2019   Procedure: BRONCHIAL BRUSHINGS;  Surgeon: Garner Nash, DO;  Location: [facility];  Service: Pulmonary;;   BRONCHIAL NEEDLE ASPIRATION BIOPSY  09/27/2019   Procedure: BRONCHIAL NEEDLE ASPIRATION BIOPSIES;  Surgeon: Garner Nash, DO;  Location: [facility];  Service: Pulmonary;;   BRONCHIAL NEEDLE ASPIRATION BIOPSY  03/29/2021   Procedure: BRONCHIAL NEEDLE ASPIRATION BIOPSIES;  Surgeon: Garner Nash, DO;  Location: [facility];  Service: Pulmonary;;   BRONCHIAL WASHINGS  09/27/2019   Procedure: BRONCHIAL WASHINGS;  Surgeon: Garner Nash, DO;  Location: [facility];  Service: Pulmonary;;   BRONCHIAL WASHINGS  03/29/2021   Procedure: BRONCHIAL WASHINGS;  Surgeon: Garner Nash, DO;  Location: [facility];  Service: Pulmonary;;   COLONOSCOPY     CRYOTHERAPY  09/27/2019   Procedure: Cydney Ok;  Surgeon: Garner Nash, DO;  Location: [facility];  Service: Pulmonary;;   CRYOTHERAPY  03/29/2021   Procedure: CRYOTHERAPY;  Surgeon: Garner Nash, DO;  Location: [facility];  Service: Pulmonary;;   FIDUCIAL MARKER PLACEMENT  09/27/2019   Procedure: FIDUCIAL MARKER PLACEMENT;  Surgeon: Garner Nash, DO;  Location: [facility];  Service: Pulmonary;;   HEMOSTASIS CONTROL  09/27/2019   Procedure: HEMOSTASIS CONTROL;  Surgeon: Garner Nash, DO;  Location: [facility];  Service: Pulmonary;;   HERNIA REPAIR     umbicial- no longer as a "belly button"   RIGHT/LEFT HEART CATH AND CORONARY ANGIOGRAPHY N/A 02/09/2020   Procedure: RIGHT/LEFT HEART CATH AND CORONARY ANGIOGRAPHY;  Surgeon: Burnell Blanks, MD;  Location: [facility];  Service: Cardiovascular;  Laterality: N/A;   VIDEO BRONCHOSCOPY WITH ENDOBRONCHIAL NAVIGATION N/A 09/27/2019   Procedure: VIDEO  BRONCHOSCOPY WITH ENDOBRONCHIAL NAVIGATION;  Surgeon: Garner Nash, DO;  Location: [facility];  Service: Pulmonary;  Laterality: N/A;   VIDEO BRONCHOSCOPY WITH ENDOBRONCHIAL ULTRASOUND N/A 03/29/2021   Procedure: VIDEO BRONCHOSCOPY WITH ENDOBRONCHIAL ULTRASOUND;  Surgeon: Garner Nash, DO;  Location: [facility];  Service: Pulmonary;  Laterality: N/A;    REVIEW OF SYSTEMS:  A comprehensive review of systems was negative except for: Constitutional: positive for fatigue   PHYSICAL EXAMINATION: General appearance: alert, cooperative, and no distress Head: Normocephalic, without obvious abnormality, atraumatic Neck: no adenopathy, no JVD, supple, symmetrical, trachea midline, and thyroid not enlarged, symmetric, no tenderness/mass/nodules Lymph nodes: Cervical, supraclavicular, and axillary nodes normal. Resp: clear to auscultation bilaterally Back: symmetric, no curvature. ROM normal. No CVA tenderness. Cardio: regular rate and rhythm, S1, S2 normal, no murmur, click, rub or gallop GI: soft, non-tender; bowel sounds normal; no masses,  no organomegaly Extremities: extremities normal, atraumatic, no cyanosis or edema  ECOG PERFORMANCE STATUS: 1 - Symptomatic but completely ambulatory  Blood pressure 119/62, pulse 82, temperature 97.6 F (36.4 C), temperature  source Tympanic, resp. rate 20, height 6' (1.829 m), weight 200 lb 3.2 oz (90.8 kg), SpO2 96 %.  LABORATORY DATA: Lab Results  Component Value Date   WBC 8.7 04/15/2021   HGB 15.7 04/15/2021   HCT 48.3 04/15/2021   MCV 95.5 04/15/2021   PLT 190 04/15/2021      Chemistry      Component Value Date/Time   NA 138 04/15/2021 1045   NA 139 02/07/2020 1208   K 4.7 04/15/2021 1045   CL 101 04/15/2021 1045   CO2 31 04/15/2021 1045   BUN 22 04/15/2021 1045   BUN 17 02/07/2020 1208   CREATININE 1.17 04/15/2021 1045      Component Value Date/Time   CALCIUM 9.5 04/15/2021 1045   ALKPHOS 65 04/15/2021 1045   AST 15  04/15/2021 1045   ALT 12 04/15/2021 1045   BILITOT 0.6 04/15/2021 1045       RADIOGRAPHIC STUDIES: MR BRAIN W WO CONTRAST  Result Date: 04/11/2021 CLINICAL DATA:  Non-small cell lung cancer staging EXAM: MRI HEAD WITHOUT AND WITH CONTRAST TECHNIQUE: Multiplanar, multiecho pulse sequences of the brain and surrounding structures were obtained without and with intravenous contrast. CONTRAST:  96mL GADAVIST GADOBUTROL 1 MMOL/ML IV SOLN COMPARISON:  No prior MRI FINDINGS: Brain: No restricted diffusion to suggest acute or subacute infarct. No acute hemorrhage, mass, mass effect, or midline shift. No abnormal enhancement. No hydrocephalus or extra-axial collection. No dural thickening or hyperenhancement. T2 hyperintense signal in the periventricular white matter and pons, likely the sequela of chronic small vessel ischemic disease. No foci of hemosiderin deposition to suggest remote hemorrhage. Vascular: Normal flow voids. Skull and upper cervical spine: Normal marrow signal. Sinuses/Orbits: Negative.  Status post bilateral lens replacements. Other: Fluid in the right-greater-than-left mastoid air cells. IMPRESSION: No acute intracranial process. No evidence of metastatic disease in the brain. Electronically Signed   By: Merilyn Baba M.D.   On: 04/11/2021 15:50     ASSESSMENT AND PLAN: This is a very pleasant 81 years old white male recently diagnosed with   1) extensive stage (T2 a, N2, M1 B) small cell lung cancer diagnosed in January 2022. Presented with new masslike area in the medial aspect of the right upper lobe with apparent invasion of the mediastinum and bulky right paratracheal lymphadenopathy in addition to liver lesions. The patient is currently undergoing systemic chemotherapy with carboplatin for AUC of 5 on day 1, etoposide 100 Mg/M2 on days 1, 2 and 3 with Cosela 240 Mg/M2 before chemotherapy as well as Imfinzi 1500 Mg IV on day 1 every 3 weeks.  Status post 1 cycle.  He started the  first cycle of his treatment last week.  The patient tolerated the first week of his treatment fairly well with no concerning adverse effects. His lab work today is unremarkable. I recommended for the patient to continue his treatment as planned and he is expected to start cycle #2 in 2 weeks.  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 Sondra Come. He was advised to call immediately if he has any other concerning symptoms in the interval.  The patient voices understanding of current disease status and treatment options and is in agreement with the current care plan.  All questions were answered. The patient knows to call the clinic  with any problems, questions or concerns. We can certainly see the patient much sooner if necessary.   Disclaimer: This note was dictated with voice recognition software. Similar sounding words can inadvertently be transcribed and may not be corrected upon review.

## 2021-04-22 NOTE — Progress Notes (Signed)
Oncology Nurse Navigator Documentation  Oncology Nurse Navigator Flowsheets 04/22/2021 04/12/2021 04/10/2021 04/04/2021 03/27/2021 03/26/2021  Abnormal Finding Date - - - - - 03/01/2021  Confirmed Diagnosis Date - 03/29/2021 - - - -  Diagnosis Status - Confirmed Diagnosis Complete - - - -  Planned Course of Treatment - Chemotherapy;Targeted Therapy - - - -  Phase of Treatment - Targeted Therapy - - - -  Chemotherapy Actual Start Date: - 04/17/2021 - - - -  Targeted Therapy Actual Start Date: - 04/17/2021 - - - -  Navigator Follow Up Date: - 05/07/2021 04/10/2021 04/05/2021 04/04/2021 03/28/2021  Navigator Follow Up Reason: - Follow-up Appointment Follow-up Appointment Appointment Review;Patient Call Pathology Appointment Review  Navigator Location [facility]  Navigator Encounter Type Clinic/MDC Other: Clinic/MDC Telephone Appt/Treatment Plan Review Clinic/MDC  Telephone - - - Outgoing Call - -  Treatment Initiated Date - 04/17/2021 - - - -  Patient Visit Type MedOnc Other MedOnc - Other MedOnc  Treatment Phase Treatment - Pre-Tx/Tx Discussion Pre-Tx/Tx Discussion Abnormal Scans -  Barriers/Navigation Needs Education/spoke with patient and his wife today. He is doing well with tx.  Answered all questions.  Coordination of Care Education Coordination of Care;Education Coordination of Care;Education Coordination of Care  Education Other - Newly Diagnosed Cancer Education;Understanding Cancer/ Treatment Options;Other Other Other -  Interventions Education;Psycho-Social Support Coordination of Care Education;Psycho-Social Support Coordination of Care;Education Coordination of Care;Education Coordination of Care  Acuity Level 2-Minimal Needs (1-2 Barriers Identified) Level 2-Minimal Needs (1-2 Barriers Identified) Level 3-Moderate Needs (3-4 Barriers Identified) Level 2-Minimal Needs (1-2 Barriers Identified) Level 3-Moderate  Needs (3-4 Barriers Identified) Level 2-Minimal Needs (1-2 Barriers Identified)  Coordination of Care - Other - - Appts Other  Education Method Verbal;Other - Written;Verbal Verbal Verbal -  Time Spent with Patient 15 30 30 30  45 30

## 2021-04-22 NOTE — Patient Instructions (Addendum)
For pathology slides call [phone] to Quit Smoking Smoking tobacco is the leading cause of preventable death. It can affect almost every organ in the body. Smoking puts you and people around you at risk for many serious, long-lasting (chronic) diseases. Quitting smoking can be hard, but it is one of the best things that you can do for your health. It is never too late to quit. How do I get ready to quit? When you decide to quit smoking, make a plan to help you succeed. Before you quit: Pick a date to quit. Set a date within the next 2 weeks to give you time to prepare. Write down the reasons why you are quitting. Keep this list in places where you will see it often. Tell your family, friends, and co-workers that you are quitting. Their support is important. Talk with your doctor about the choices that may help you quit. Find out if your health insurance will pay for these treatments. Know the people, places, things, and activities that make you want to smoke (triggers). Avoid them. What first steps can I take to quit smoking? Throw away all cigarettes at home, at work, and in your car. Throw away the things that you use when you smoke, such as ashtrays and lighters. Clean your car. Make sure to empty the ashtray. Clean your home, including curtains and carpets. What can I do to help me quit smoking? Talk with your doctor about taking medicines and seeing a counselor at the same time. You are more likely to succeed when you do both. If you are pregnant or breastfeeding, talk with your doctor about counseling or other ways to quit smoking. Do not take medicine to help you quit smoking unless your doctor tells you to do so. To quit smoking: Quit right away Quit smoking totally, instead of slowly cutting back on how much you smoke over a period of time. Go to counseling. You are more likely to quit if you go to counseling sessions regularly. Take medicine You may take medicines to help  you quit. Some medicines need a prescription, and some you can buy over-the-counter. Some medicines may contain a drug called nicotine to replace the nicotine in cigarettes. Medicines may: Help you to stop having the desire to smoke (cravings). Help to stop the problems that come when you stop smoking (withdrawal symptoms). Your doctor may ask you to use: Nicotine patches, gum, or lozenges. Nicotine inhalers or sprays. Non-nicotine medicine that is taken by mouth. Find resources Find resources and other ways to help you quit smoking and remain smoke-free after you quit. These resources are most helpful when you use them often. They include: Online chats with a Social worker. Phone quitlines. Printed Furniture conservator/restorer. Support groups or group counseling. Text messaging programs. Mobile phone apps. Use apps on your mobile phone or tablet that can help you stick to your quit plan. There are many free apps for mobile phones and tablets as well as websites. Examples include Quit Guide from the [employer] and smokefree.gov  What things can I do to make it easier to quit?  Talk to your family and friends. Ask them to support and encourage you. Call a phone quitline (1-800-QUIT-NOW), reach out to support groups, or work with a Social worker. Ask people who smoke to not smoke around you. Avoid places that make you want to smoke, such as: Bars. Parties. Smoke-break areas at work. Spend time with people who do not smoke. Lower the stress in your life. Stress can  make you want to smoke. Try these things to help your stress: Getting regular exercise. Doing deep-breathing exercises. Doing yoga. Meditating. Doing a body scan. To do this, close your eyes, focus on one area of your body at a time from head to toe. Notice which parts of your body are tense. Try to relax the muscles in those areas. How will I feel when I quit smoking? Day 1 to 3 weeks Within the first 24 hours, you may start to have some problems  that come from quitting tobacco. These problems are very bad 2-3 days after you quit, but they do not often last for more than 2-3 weeks. You may get these symptoms: Mood swings. Feeling restless, nervous, angry, or annoyed. Trouble concentrating. Dizziness. Strong desire for high-sugar foods and nicotine. Weight gain. Trouble pooping (constipation). Feeling like you may vomit (nausea). Coughing or a sore throat. Changes in how the medicines that you take for other issues work in your body. Depression. Trouble sleeping (insomnia). Week 3 and afterward After the first 2-3 weeks of quitting, you may start to notice more positive results, such as: Better sense of smell and taste. Less coughing and sore throat. Slower heart rate. Lower blood pressure. Clearer skin. Better breathing. Fewer sick days. Quitting smoking can be hard. Do not give up if you fail the first time. Some people need to try a few times before they succeed. Do your best to stick to your quit plan, and talk with your doctor if you have any questions or concerns. Summary Smoking tobacco is the leading cause of preventable death. Quitting smoking can be hard, but it is one of the best things that you can do for your health. When you decide to quit smoking, make a plan to help you succeed. Quit smoking right away, not slowly over a period of time. When you start quitting, seek help from your doctor, family, or friends. This information is not intended to replace advice given to you by your health care provider. Make sure you discuss any questions you have with your health care provider. Document Revised: 11/23/2020 Document Reviewed: 06/05/2018 Elsevier Patient Education  2022 [employer].  For pathology results call [phone] and ask for medical records

## 2021-04-23 ENCOUNTER — Other Ambulatory Visit: Payer: Self-pay

## 2021-04-23 ENCOUNTER — Inpatient Hospital Stay (HOSPITAL_COMMUNITY)
Admission: EM | Admit: 2021-04-23 | Discharge: 2021-04-30 | DRG: 871 | Disposition: A | Discharge status: Home Health | Payer: Medicare Other | Attending: Family Medicine | Admitting: Family Medicine

## 2021-04-23 ENCOUNTER — Other Ambulatory Visit: Payer: Medicare Other

## 2021-04-23 ENCOUNTER — Emergency Department (HOSPITAL_COMMUNITY): Payer: Medicare Other

## 2021-04-23 ENCOUNTER — Encounter (HOSPITAL_COMMUNITY): Payer: Self-pay

## 2021-04-23 DIAGNOSIS — J910 Malignant pleural effusion: Secondary | ICD-10-CM | POA: Diagnosis present

## 2021-04-23 DIAGNOSIS — I2510 Atherosclerotic heart disease of native coronary artery without angina pectoris: Secondary | ICD-10-CM | POA: Diagnosis present

## 2021-04-23 DIAGNOSIS — E871 Hypo-osmolality and hyponatremia: Secondary | ICD-10-CM

## 2021-04-23 DIAGNOSIS — J9600 Acute respiratory failure, unspecified whether with hypoxia or hypercapnia: Secondary | ICD-10-CM

## 2021-04-23 DIAGNOSIS — L8992 Pressure ulcer of unspecified site, stage 2: Secondary | ICD-10-CM | POA: Diagnosis present

## 2021-04-23 DIAGNOSIS — J90 Pleural effusion, not elsewhere classified: Secondary | ICD-10-CM

## 2021-04-23 DIAGNOSIS — Z7982 Long term (current) use of aspirin: Secondary | ICD-10-CM

## 2021-04-23 DIAGNOSIS — H02109 Unspecified ectropion of unspecified eye, unspecified eyelid: Secondary | ICD-10-CM

## 2021-04-23 DIAGNOSIS — R338 Other retention of urine: Secondary | ICD-10-CM

## 2021-04-23 DIAGNOSIS — D61810 Antineoplastic chemotherapy induced pancytopenia: Secondary | ICD-10-CM | POA: Diagnosis present

## 2021-04-23 DIAGNOSIS — Z79899 Other long term (current) drug therapy: Secondary | ICD-10-CM

## 2021-04-23 DIAGNOSIS — E7800 Pure hypercholesterolemia, unspecified: Secondary | ICD-10-CM | POA: Diagnosis present

## 2021-04-23 DIAGNOSIS — R945 Abnormal results of liver function studies: Secondary | ICD-10-CM

## 2021-04-23 DIAGNOSIS — D61818 Other pancytopenia: Secondary | ICD-10-CM

## 2021-04-23 DIAGNOSIS — Z85118 Personal history of other malignant neoplasm of bronchus and lung: Secondary | ICD-10-CM

## 2021-04-23 DIAGNOSIS — J188 Other pneumonia, unspecified organism: Secondary | ICD-10-CM | POA: Diagnosis present

## 2021-04-23 DIAGNOSIS — N179 Acute kidney failure, unspecified: Secondary | ICD-10-CM

## 2021-04-23 DIAGNOSIS — Z801 Family history of malignant neoplasm of trachea, bronchus and lung: Secondary | ICD-10-CM

## 2021-04-23 DIAGNOSIS — Z888 Allergy status to other drugs, medicaments and biological substances status: Secondary | ICD-10-CM

## 2021-04-23 DIAGNOSIS — R339 Retention of urine, unspecified: Secondary | ICD-10-CM | POA: Diagnosis not present

## 2021-04-23 DIAGNOSIS — L89152 Pressure ulcer of sacral region, stage 2: Secondary | ICD-10-CM | POA: Diagnosis present

## 2021-04-23 DIAGNOSIS — J432 Centrilobular emphysema: Secondary | ICD-10-CM | POA: Diagnosis present

## 2021-04-23 DIAGNOSIS — J189 Pneumonia, unspecified organism: Secondary | ICD-10-CM

## 2021-04-23 DIAGNOSIS — Z20822 Contact with and (suspected) exposure to covid-19: Secondary | ICD-10-CM | POA: Diagnosis present

## 2021-04-23 DIAGNOSIS — F17210 Nicotine dependence, cigarettes, uncomplicated: Secondary | ICD-10-CM | POA: Diagnosis present

## 2021-04-23 DIAGNOSIS — Z8249 Family history of ischemic heart disease and other diseases of the circulatory system: Secondary | ICD-10-CM

## 2021-04-23 DIAGNOSIS — C3411 Malignant neoplasm of upper lobe, right bronchus or lung: Secondary | ICD-10-CM | POA: Diagnosis present

## 2021-04-23 DIAGNOSIS — Z9889 Other specified postprocedural states: Secondary | ICD-10-CM

## 2021-04-23 DIAGNOSIS — L409 Psoriasis, unspecified: Secondary | ICD-10-CM | POA: Diagnosis present

## 2021-04-23 DIAGNOSIS — I255 Ischemic cardiomyopathy: Secondary | ICD-10-CM | POA: Diagnosis present

## 2021-04-23 DIAGNOSIS — T451X5A Adverse effect of antineoplastic and immunosuppressive drugs, initial encounter: Secondary | ICD-10-CM | POA: Diagnosis present

## 2021-04-23 DIAGNOSIS — H01009 Unspecified blepharitis unspecified eye, unspecified eyelid: Secondary | ICD-10-CM

## 2021-04-23 DIAGNOSIS — Z7951 Long term (current) use of inhaled steroids: Secondary | ICD-10-CM

## 2021-04-23 DIAGNOSIS — A419 Sepsis, unspecified organism: Secondary | ICD-10-CM

## 2021-04-23 DIAGNOSIS — E869 Volume depletion, unspecified: Secondary | ICD-10-CM | POA: Diagnosis present

## 2021-04-23 DIAGNOSIS — R7989 Other specified abnormal findings of blood chemistry: Secondary | ICD-10-CM

## 2021-04-23 LAB — CBC WITH DIFFERENTIAL/PLATELET
Abs Immature Granulocytes: 0.29 10*3/uL — ABNORMAL HIGH (ref 0.00–0.07)
Basophils Absolute: 0 10*3/uL (ref 0.0–0.1)
Basophils Relative: 1 %
Eosinophils Absolute: 0 10*3/uL (ref 0.0–0.5)
Eosinophils Relative: 1 %
HCT: 42.1 % (ref 39.0–52.0)
Hemoglobin: 13.8 g/dL (ref 13.0–17.0)
Immature Granulocytes: 8 %
Lymphocytes Relative: 22 %
Lymphs Abs: 0.8 10*3/uL (ref 0.7–4.0)
MCH: 31.7 pg (ref 26.0–34.0)
MCHC: 32.8 g/dL (ref 30.0–36.0)
MCV: 96.6 fL (ref 80.0–100.0)
Monocytes Absolute: 0.1 10*3/uL (ref 0.1–1.0)
Monocytes Relative: 3 %
Neutro Abs: 2.3 10*3/uL (ref 1.7–7.7)
Neutrophils Relative %: 65 %
Platelets: 111 10*3/uL — ABNORMAL LOW (ref 150–400)
RBC: 4.36 MIL/uL (ref 4.22–5.81)
RDW: 13.7 % (ref 11.5–15.5)
WBC: 3.5 10*3/uL — ABNORMAL LOW (ref 4.0–10.5)
nRBC: 0 % (ref 0.0–0.2)

## 2021-04-23 LAB — COMPREHENSIVE METABOLIC PANEL
ALT: 19 U/L (ref 0–44)
AST: 19 U/L (ref 15–41)
Albumin: 3.5 g/dL (ref 3.5–5.0)
Alkaline Phosphatase: 55 U/L (ref 38–126)
Anion gap: 8 (ref 5–15)
BUN: 23 mg/dL (ref 8–23)
CO2: 27 mmol/L (ref 22–32)
Calcium: 8.6 mg/dL — ABNORMAL LOW (ref 8.9–10.3)
Chloride: 98 mmol/L (ref 98–111)
Creatinine, Ser: 1.38 mg/dL — ABNORMAL HIGH (ref 0.61–1.24)
GFR, Estimated: 51 mL/min — ABNORMAL LOW (ref >60)
Glucose, Bld: 110 mg/dL — ABNORMAL HIGH (ref 70–99)
Potassium: 4.3 mmol/L (ref 3.5–5.1)
Sodium: 133 mmol/L — ABNORMAL LOW (ref 135–145)
Total Bilirubin: 1 mg/dL (ref 0.3–1.2)
Total Protein: 7.2 g/dL (ref 6.5–8.1)

## 2021-04-23 LAB — RESP PANEL BY RT-PCR (FLU A&B, COVID) ARPGX2
Influenza A by PCR: NEGATIVE
Influenza B by PCR: NEGATIVE
SARS Coronavirus 2 by RT PCR: NEGATIVE

## 2021-04-23 LAB — LACTIC ACID, PLASMA: Lactic Acid, Venous: 1.3 mmol/L (ref 0.5–1.9)

## 2021-04-23 LAB — PROTIME-INR
INR: 1.1 (ref 0.8–1.2)
Prothrombin Time: 14.2 seconds (ref 11.4–15.2)

## 2021-04-23 LAB — TROPONIN I (HIGH SENSITIVITY): Troponin I (High Sensitivity): 11 ng/L (ref <18)

## 2021-04-23 LAB — APTT: aPTT: 29 seconds (ref 24–36)

## 2021-04-23 MED ORDER — LACTATED RINGERS IV BOLUS (SEPSIS)
500.0000 mL | Freq: Once | INTRAVENOUS | Status: AC
  Administered 2021-04-23: 500 mL via INTRAVENOUS

## 2021-04-23 MED ORDER — METRONIDAZOLE 500 MG/100ML IV SOLN
500.0000 mg | Freq: Once | INTRAVENOUS | Status: AC
  Administered 2021-04-23: 500 mg via INTRAVENOUS
  Filled 2021-04-23: qty 100

## 2021-04-23 MED ORDER — NICOTINE 14 MG/24HR TD PT24
14.0000 mg | MEDICATED_PATCH | Freq: Once | TRANSDERMAL | Status: AC
  Administered 2021-04-23: 14 mg via TRANSDERMAL
  Filled 2021-04-23: qty 1

## 2021-04-23 MED ORDER — LACTATED RINGERS IV SOLN: INTRAVENOUS | Status: DC

## 2021-04-23 MED ORDER — ACETAMINOPHEN 325 MG PO TABS
650.0000 mg | ORAL_TABLET | Freq: Once | ORAL | Status: AC
Start: 2021-04-23 — End: 2021-04-23
  Administered 2021-04-23: 23:00:00 650 mg via ORAL
  Filled 2021-04-23: qty 2

## 2021-04-23 MED ORDER — IPRATROPIUM-ALBUTEROL 0.5-2.5 (3) MG/3ML IN SOLN
3.0000 mL | Freq: Once | RESPIRATORY_TRACT | Status: AC
  Administered 2021-04-23: 3 mL via RESPIRATORY_TRACT
  Filled 2021-04-23: qty 3

## 2021-04-23 MED ORDER — SODIUM CHLORIDE 0.9 % IV SOLN
2.0000 g | Freq: Once | INTRAVENOUS | Status: AC
  Administered 2021-04-23: 23:00:00 2 g via INTRAVENOUS
  Filled 2021-04-23: qty 2

## 2021-04-23 MED ORDER — VANCOMYCIN HCL IN DEXTROSE 1-5 GM/200ML-% IV SOLN
1000.0000 mg | Freq: Once | INTRAVENOUS | Status: AC
  Administered 2021-04-24: 01:00:00 1000 mg via INTRAVENOUS
  Filled 2021-04-23: qty 200

## 2021-04-23 NOTE — Progress Notes (Signed)
A consult was received from an ED physician for cefepime and vanc per pharmacy dosing.  The patient's profile has been reviewed for ht/wt/allergies/indication/available labs.   A one time order has been placed for cefepime 2gm and vancomycin 1gm.  Further antibiotics/pharmacy consults should be ordered by admitting physician if indicated.                       Thank you, Dolly Rias RPh 04/23/2021, 10:43 PM

## 2021-04-23 NOTE — ED Provider Triage Note (Signed)
Emergency Medicine Provider Triage Evaluation Note  Gerald [name] , a 81 y.o. male  was evaluated in triage.  Pt complains of fever.  Patient had a temperature of 100.6 today at home.  Call the cancer center, and he was instructed to come to the ER.  Family reports he has had eye irritation for the past several days, as well as nasal congestion.  This concerned that he is breathing heavier than normal.  He has a history of COPD and lung cancer, recently started chemo.  He denies cough, nausea, vomiting, abdominal pain, urinary symptoms, abnormal bowel movements  Review of Systems  Positive: Fever, eye irritation, sob Negative: Abd pain  Physical Exam  BP 121/79 (BP Location: Left Arm)    Pulse (!) 108    Temp 98.8 F (37.1 C) (Oral)    Resp 18    Ht 6' (1.829 m)    Wt 90.7 kg    SpO2 94%    BMI 27.12 kg/m  Gen:   Awake, no distress   Resp:  Normal effort.  Coarse lung sounds throughout MSK:   Moves extremities without difficulty  Other:  No TTP of the abdomen.  Bilateral eye conjunctival injection  Medical Decision Making  Medically screening exam initiated at 9:50 PM.  Appropriate orders placed.  Javaun Dimperio was informed that the remainder of the evaluation will be completed by another provider, this initial triage assessment does not replace that evaluation, and the importance of remaining in the ED until their evaluation is complete.  Labs, cxr, Amado Nash, [place] 04/23/21 2159

## 2021-04-23 NOTE — ED Triage Notes (Signed)
Fever on 100.6 at home prior to arrival. Just  began chemo infusions for lung cancer last week. Cough, runny nose, and eye drainage.

## 2021-04-23 NOTE — ED Provider Notes (Signed)
[facility] DEPT Provider Note   CSN: [id number] Arrival date & time: 04/23/21  2123    History  Chief Complaint  Patient presents with   Fever    Gerald [name] is a 81 y.o. male extensive small cell lung CA recent  chemo start 1 week ago here for evaluation of fever.  Temp max 100.6 at home.  Call the cancer center instructed to come to emergency department.  He has had eye irritation is also nasal congestion.  Family is concerned that he is breathing heavier than normal, cough worse then normal.  Apparently ophthalmology thought patient just had some dry eye irritation.  Rhinorrhea is clear in nature.  Cough productive of yellow sputum.  He denies any hemoptysis.  He denies any new chest pain.  No lower extremity swelling, pain in calves.  No history of PE, DVT.  He is not anticoagulated.  Feels very weak and fatigued.  No sore throat, emesis, back pain, abdominal pain, diarrhea, dysuria, rashes or lesions.  No meds PTA.  History of COPD, uses daily inhaler however no rescue inhaler.  HPI     Home Medications Prior to Admission medications   Medication Sig Start Date End Date Taking? Authorizing Provider  acitretin (SORIATANE) 25 MG capsule Take 25 mg by mouth in the morning.   Yes [provider]  aspirin EC 81 MG tablet Take 1 tablet (81 mg total) by mouth daily. Swallow whole. 01/26/20  Yes Buford Dresser, MD  clobetasol cream (TEMOVATE) 7.32 % Apply 1 application topically daily as needed (Psoriasis).   Yes [provider]  Fluticasone-Umeclidin-Vilant (TRELEGY ELLIPTA) 100-62.5-25 MCG/ACT AEPB Inhale into the lungs.   Yes [provider]  Multiple Vitamins-Minerals (PRESERVISION AREDS 2 PO) Take 1 tablet by mouth in the morning and at bedtime.   Yes [provider]  niacinamide 500 MG tablet Take 500 mg by mouth daily with breakfast.   Yes [provider]  Omega-3 Fatty Acids (FISH OIL) 1200 MG CAPS  Take 1,200 mg by mouth in the morning.   Yes [provider]  rosuvastatin (CRESTOR) 20 MG tablet Take 1 tablet by mouth once daily Patient taking differently: Take 20 mg by mouth every evening. 03/11/21  Yes Buford Dresser, MD  triamcinolone cream (KENALOG) 0.1 % Apply 1 application topically daily as needed (skin irritation.). 02/01/21  Yes [provider]  vitamin B-12 (CYANOCOBALAMIN) 500 MCG tablet Take 500 mcg by mouth every evening.   Yes [provider]  colchicine 0.6 MG tablet Take 1.2mg  initially followed by 0.6mg  in 1 hour.  Continue 0.6mg  daily until improvement. Patient not taking: Reported on 04/23/2021 10/24/20   [name], DO  lidocaine-prilocaine (EMLA) cream Apply to the Port-A-Cath site 30-60-minute before chemotherapy. Patient not taking: Reported on 04/22/2021 04/16/21   Curt Bears, MD  prochlorperazine (COMPAZINE) 10 MG tablet Take 1 tablet (10 mg total) by mouth every 6 (six) hours as needed. Patient not taking: Reported on 04/22/2021 04/10/21   Heilingoetter, Cassandra L, PA-C      Allergies    Cosela [trilaciclib]    Review of Systems   Review of Systems  Constitutional:  Positive for activity change, appetite change, fatigue and fever.  HENT:  Positive for congestion, postnasal drip and rhinorrhea. Negative for sore throat, trouble swallowing and voice change.   Respiratory:  Positive for cough and shortness of breath.   Cardiovascular: Negative.   Gastrointestinal: Negative.   Genitourinary: Negative.   Musculoskeletal: Negative.  Skin: Negative.   Neurological:  Positive for weakness.  All other systems reviewed and are negative.  Physical Exam Updated Vital Signs BP 116/64    Pulse 98    Temp (!) 102.1 F (38.9 C) (Rectal)    Resp 18    Ht 6' (1.829 m)    Wt 90.7 kg    SpO2 92%    BMI 27.12 kg/m  Physical Exam Vitals and nursing note reviewed.  Constitutional:      General: He is not in acute distress.     Appearance: He is well-developed. He is ill-appearing. He is not toxic-appearing or diaphoretic.     Comments: Feels hot to the touch  HENT:     Head: Normocephalic and atraumatic.     Nose: Congestion and rhinorrhea present.     Mouth/Throat:     Mouth: Mucous membranes are moist.     Comments: P.o. clear, tongue midline Eyes:     General: Lids are everted, no foreign bodies appreciated. Vision grossly intact.        Right eye: Discharge present.        Left eye: Discharge present.    Extraocular Movements: Extraocular movements intact.     Conjunctiva/sclera:     Right eye: Right conjunctiva is injected.     Left eye: Left conjunctiva is injected.     Pupils: Pupils are equal, round, and reactive to light.     Comments: Bilateral conjunctival irritation with matting to bilateral eyes.  Clear, watery discharge from bilateral eyes  Neck:     Comments: No neck stiffness or neck rigidity.  No meningismus Cardiovascular:     Rate and Rhythm: Regular rhythm. Tachycardia present.     Pulses: Normal pulses.     Heart sounds: Normal heart sounds.     Comments: Tachycardic low 100s Pulmonary:     Effort: Pulmonary effort is normal. No respiratory distress.     Comments: Diffuse expiratory wheeze, coarse rhonchi bilaterally, speaks without difficulty Abdominal:     General: Bowel sounds are normal. There is no distension.     Palpations: Abdomen is soft.     Tenderness: There is no abdominal tenderness. There is no guarding or rebound.     Comments: Soft, nontender without rebound or guarding  Musculoskeletal:        General: No swelling, tenderness, deformity or signs of injury. Normal range of motion.     Cervical back: Normal range of motion and neck supple.     Right lower leg: No edema.     Left lower leg: No edema.     Comments: No bony tenderness, full range of motion without difficulty  Skin:    General: Skin is warm and dry.     Capillary Refill: Capillary refill takes less  than 2 seconds.     Comments: Stage II sacral wound  Neurological:     General: No focal deficit present.     Mental Status: He is alert and oriented to person, place, and time.     Comments: Cranial nerves II through XII grossly intact Intact sensation Equal strength bilaterally    ED Results / Procedures / Treatments   Labs (all labs ordered are listed, but only abnormal results are displayed) Labs Reviewed  RESP PANEL BY RT-PCR (FLU A&B, COVID) ARPGX2  CULTURE, BLOOD (ROUTINE X 2)  CULTURE, BLOOD (ROUTINE X 2)  URINE CULTURE  PROTIME-INR  APTT  CBC WITH DIFFERENTIAL/PLATELET  COMPREHENSIVE METABOLIC PANEL  LACTIC  ACID, PLASMA  LACTIC ACID, PLASMA  URINALYSIS, ROUTINE W REFLEX MICROSCOPIC  TROPONIN I (HIGH SENSITIVITY)    EKG EKG Interpretation  Date/Time:  Tuesday April 23 2021 22:18:02 EST Ventricular Rate:  102 PR Interval:  181 QRS Duration: 105 QT Interval:  318 QTC Calculation: 415 R Axis:   31 Text Interpretation: Sinus tachycardia Multiple ventricular premature complexes Low voltage, precordial leads Posterior infarct, old increased rate and ectopy from prior 6/21 Confirmed by Aletta Edouard [phone]) on 04/23/2021 10:28:01 PM  Radiology DG Chest 2 View  Result Date: 04/23/2021 CLINICAL DATA:  Shortness of breath fever EXAM: CHEST - 2 VIEW COMPARISON:  09/27/2019, CT 02/28/2021, PET CT 02/28/2021 FINDINGS: Volume loss in the right thorax. Fiducial markers in the right mid lung. Small loculated right pleural effusion. Worsening airspace disease in the right lung base. Right paratracheal opacity corresponding to adenopathy. Stable cardiac size. Left lung is clear. Emphysema IMPRESSION: 1. Small loculated right pleural effusion with increased airspace disease in the right lung base raising concern for acute pneumonia superimposed on previously noted areas of consolidation and possible recurrent on prior CT. 2. Emphysema.  Cardiomegaly. Electronically Signed   By: Donavan Foil M.D.   On: 04/23/2021 22:22    Procedures Procedures    Medications Ordered in ED Medications  lactated ringers infusion (has no administration in time range)  ceFEPIme (MAXIPIME) 2 g in sodium chloride 0.9 % 100 mL IVPB (2 g Intravenous New Bag/Given 04/23/21 2258)  metroNIDAZOLE (FLAGYL) IVPB 500 mg (has no administration in time range)  vancomycin (VANCOCIN) IVPB 1000 mg/200 mL premix (has no administration in time range)  lactated ringers bolus 500 mL (has no administration in time range)  ipratropium-albuterol (DUONEB) 0.5-2.5 (3) MG/3ML nebulizer solution 3 mL (has no administration in time range)  nicotine (NICODERM CQ - dosed in mg/24 hours) patch 14 mg (has no administration in time range)  acetaminophen (TYLENOL) tablet 650 mg (650 mg Oral Given 04/23/21 2257)    ED Course/ Medical Decision Making/ A&P    81 year old here for evaluation of fever and feeling unwell over the last few days.  On arrival he is febrile, tachycardic.  Started chemotherapy treatment approximately 1 week ago.  Heart is tachycardic, has mild expiratory wheeze however coarse rhonchi bilaterally.  Mild tachypnea.  Normotensive.  Abdomen soft, nontender.  Nonfocal neuro exam without deficits.  Does have a stage II sacral wound however does not appear to be actively infected at this time.  Concern for sepsis, neutropenic fever?  Code sepsis called on initial evaluation, given IV fluids, broad-spectrum antibiotics.  Labs and imaging personally reviewed and interpreted:  DG chest with loculated right pleural effusion as well as acute consolidation concerning for new pneumonia CBC leukopenia at 3.5, normal neutrophils CMP with hyponatremia, creatinine 1.38 up from baseline COVID, FLU neg Lactic  1.3 Trop 10 EKG not crossing into Epic, no ischemic changes   Discussed results with patient, family in room.  Concern for sepsis due to pneumonia with concern as well for recent chemo/ fever.  Low  suspicion for acute PE, ACS causing patient's UR symptoms.  He has no urinary complaints to suggest UTI.  No skin changes to suggest cellulitis.  Thankfully has normal mentation, normal BP.  Will need to be admitted for further management and work-up.  Family agreeable  CONSULT with Dr. Hal Hope with [facility] who received outpatient for admission  The patient appears reasonably stabilized for admission considering the current resources, flow, and capabilities available in the  ED at this time, and I doubt any other [facility] requiring further screening and/or treatment in the ED prior to admission.                            Medical Decision Making Amount and/or Complexity of Data Reviewed Independent Historian: spouse    Details: Spouse and family in room External Data Reviewed: labs, radiology and notes.    Details: Oncology notes, recent palliative chemo started 1 week ago Labs: ordered. Decision-making details documented in ED Course. Radiology: ordered and independent interpretation performed. Decision-making details documented in ED Course. ECG/medicine tests: ordered and independent interpretation performed. Decision-making details documented in ED Course.  Risk OTC drugs. Prescription drug management. Parenteral controlled substances. Decision regarding hospitalization. Diagnosis or treatment significantly limited by social determinants of health.          Final Clinical Impression(s) / ED Diagnoses Final diagnoses:  Sepsis without acute organ dysfunction, due to unspecified organism [facility])  Community acquired pneumonia, unspecified laterality  Pleural effusion  History of lung cancer  Hyponatremia  AKI (acute kidney injury) [facility])    Rx / DC Orders ED Discharge Orders     None         Delcie Ruppert A, PA-C 04/24/21 0341    Maudie Flakes, MD 04/24/21 0710

## 2021-04-23 NOTE — Sepsis Progress Note (Signed)
Monitoring for the code sepsis protocol. °

## 2021-04-24 ENCOUNTER — Encounter (HOSPITAL_COMMUNITY): Payer: Self-pay | Admitting: Internal Medicine

## 2021-04-24 ENCOUNTER — Encounter: Payer: Self-pay | Admitting: Internal Medicine

## 2021-04-24 ENCOUNTER — Observation Stay (HOSPITAL_COMMUNITY): Payer: Medicare Other

## 2021-04-24 DIAGNOSIS — J188 Other pneumonia, unspecified organism: Secondary | ICD-10-CM | POA: Diagnosis present

## 2021-04-24 DIAGNOSIS — J189 Pneumonia, unspecified organism: Secondary | ICD-10-CM | POA: Diagnosis not present

## 2021-04-24 LAB — CBC WITH DIFFERENTIAL/PLATELET
Abs Immature Granulocytes: 0.16 10*3/uL — ABNORMAL HIGH (ref 0.00–0.07)
Basophils Absolute: 0 10*3/uL (ref 0.0–0.1)
Basophils Relative: 2 %
Eosinophils Absolute: 0 10*3/uL (ref 0.0–0.5)
Eosinophils Relative: 1 %
HCT: 38.4 % — ABNORMAL LOW (ref 39.0–52.0)
Hemoglobin: 12.3 g/dL — ABNORMAL LOW (ref 13.0–17.0)
Immature Granulocytes: 7 %
Lymphocytes Relative: 25 %
Lymphs Abs: 0.6 10*3/uL — ABNORMAL LOW (ref 0.7–4.0)
MCH: 30.9 pg (ref 26.0–34.0)
MCHC: 32 g/dL (ref 30.0–36.0)
MCV: 96.5 fL (ref 80.0–100.0)
Monocytes Absolute: 0.2 10*3/uL (ref 0.1–1.0)
Monocytes Relative: 10 %
Neutro Abs: 1.2 10*3/uL — ABNORMAL LOW (ref 1.7–7.7)
Neutrophils Relative %: 55 %
Platelets: 94 10*3/uL — ABNORMAL LOW (ref 150–400)
RBC: 3.98 MIL/uL — ABNORMAL LOW (ref 4.22–5.81)
RDW: 13.6 % (ref 11.5–15.5)
WBC: 2.3 10*3/uL — ABNORMAL LOW (ref 4.0–10.5)
nRBC: 0 % (ref 0.0–0.2)

## 2021-04-24 LAB — URINE CULTURE: Culture: NO GROWTH

## 2021-04-24 LAB — BASIC METABOLIC PANEL
Anion gap: 6 (ref 5–15)
BUN: 22 mg/dL (ref 8–23)
CO2: 28 mmol/L (ref 22–32)
Calcium: 8.2 mg/dL — ABNORMAL LOW (ref 8.9–10.3)
Chloride: 101 mmol/L (ref 98–111)
Creatinine, Ser: 1.06 mg/dL (ref 0.61–1.24)
GFR, Estimated: >60 mL/min (ref >60)
Glucose, Bld: 118 mg/dL — ABNORMAL HIGH (ref 70–99)
Potassium: 3.8 mmol/L (ref 3.5–5.1)
Sodium: 135 mmol/L (ref 135–145)

## 2021-04-24 LAB — URINALYSIS, ROUTINE W REFLEX MICROSCOPIC
Bilirubin Urine: NEGATIVE
Glucose, UA: NEGATIVE mg/dL
Hgb urine dipstick: NEGATIVE
Ketones, ur: NEGATIVE mg/dL
Leukocytes,Ua: NEGATIVE
Nitrite: NEGATIVE
Protein, ur: NEGATIVE mg/dL
Specific Gravity, Urine: 1.016 (ref 1.005–1.030)
pH: 6 (ref 5.0–8.0)

## 2021-04-24 LAB — LACTIC ACID, PLASMA: Lactic Acid, Venous: 0.8 mmol/L (ref 0.5–1.9)

## 2021-04-24 LAB — HIV ANTIBODY (ROUTINE TESTING W REFLEX): HIV Screen 4th Generation wRfx: NONREACTIVE

## 2021-04-24 LAB — TROPONIN I (HIGH SENSITIVITY): Troponin I (High Sensitivity): 10 ng/L (ref <18)

## 2021-04-24 MED ORDER — HEPARIN SODIUM (PORCINE) 5000 UNIT/ML IJ SOLN
5000.0000 [IU] | Freq: Three times a day (TID) | INTRAMUSCULAR | Status: AC
  Administered 2021-04-24 – 2021-04-25 (×6): 5000 [IU] via SUBCUTANEOUS
  Filled 2021-04-24 (×6): qty 1

## 2021-04-24 MED ORDER — LACTATED RINGERS IV SOLN: INTRAVENOUS | Status: AC

## 2021-04-24 MED ORDER — SODIUM CHLORIDE 0.9 % IV SOLN
500.0000 mg | INTRAVENOUS | Status: AC
  Administered 2021-04-24 – 2021-04-28 (×5): 500 mg via INTRAVENOUS
  Filled 2021-04-24 (×5): qty 5

## 2021-04-24 MED ORDER — ASPIRIN EC 81 MG PO TBEC
81.0000 mg | DELAYED_RELEASE_TABLET | Freq: Every day | ORAL | Status: DC
  Administered 2021-04-24 – 2021-04-30 (×7): 81 mg via ORAL
  Filled 2021-04-24 (×7): qty 1

## 2021-04-24 MED ORDER — NIACINAMIDE 500 MG PO TABS: 500.0000 mg | ORAL_TABLET | Freq: Every day | ORAL | Status: DC

## 2021-04-24 MED ORDER — ALBUTEROL SULFATE (2.5 MG/3ML) 0.083% IN NEBU
2.5000 mg | INHALATION_SOLUTION | Freq: Four times a day (QID) | RESPIRATORY_TRACT | Status: DC
  Administered 2021-04-24 – 2021-04-25 (×5): 2.5 mg via RESPIRATORY_TRACT
  Filled 2021-04-24 (×5): qty 3

## 2021-04-24 MED ORDER — ACITRETIN 25 MG PO CAPS: 25.0000 mg | ORAL_CAPSULE | Freq: Every day | ORAL | Status: DC

## 2021-04-24 MED ORDER — ALBUTEROL SULFATE (2.5 MG/3ML) 0.083% IN NEBU: 2.5000 mg | INHALATION_SOLUTION | RESPIRATORY_TRACT | Status: DC | PRN

## 2021-04-24 MED ORDER — ACETAMINOPHEN 325 MG PO TABS: 650.0000 mg | ORAL_TABLET | Freq: Four times a day (QID) | ORAL | Status: DC | PRN

## 2021-04-24 MED ORDER — ACETAMINOPHEN 650 MG RE SUPP: 650.0000 mg | Freq: Four times a day (QID) | RECTAL | Status: DC | PRN

## 2021-04-24 MED ORDER — ROSUVASTATIN CALCIUM 20 MG PO TABS
20.0000 mg | ORAL_TABLET | Freq: Every evening | ORAL | Status: DC
  Administered 2021-04-24 – 2021-04-26 (×3): 20 mg via ORAL
  Filled 2021-04-24 (×4): qty 1

## 2021-04-24 MED ORDER — OFLOXACIN 0.3 % OP SOLN
1.0000 [drp] | Freq: Four times a day (QID) | OPHTHALMIC | Status: DC
  Administered 2021-04-24 – 2021-04-25 (×5): 1 [drp] via OPHTHALMIC
  Filled 2021-04-24: qty 5

## 2021-04-24 MED ORDER — OMEGA-3-ACID ETHYL ESTERS 1 G PO CAPS
1.0000 g | ORAL_CAPSULE | Freq: Every day | ORAL | Status: DC
  Administered 2021-04-24 – 2021-04-29 (×6): 1 g via ORAL
  Filled 2021-04-24 (×6): qty 1

## 2021-04-24 MED ORDER — SODIUM CHLORIDE 0.9 % IV SOLN
2.0000 g | INTRAVENOUS | Status: AC
  Administered 2021-04-24 – 2021-04-28 (×5): 2 g via INTRAVENOUS
  Filled 2021-04-24 (×5): qty 20

## 2021-04-24 MED ORDER — VITAMIN B-12 1000 MCG PO TABS
500.0000 ug | ORAL_TABLET | Freq: Every evening | ORAL | Status: DC
  Administered 2021-04-24 – 2021-04-29 (×6): 500 ug via ORAL
  Filled 2021-04-24 (×7): qty 1

## 2021-04-24 NOTE — Progress Notes (Signed)
Interventional Radiology Progress Note  Patient with known lung cancer, presents with pneumonia.   VIR reviewed new chest CT, with question of utility for chest tube.   Right pleural fluid is small volume, unchanged from PET 1 month prior.  There is progression of airspace and interstitial disease on the right, potentially infection/progression.    Clear pleural disease.   Impression is that chest tube would be unlikely to yield any change in his capacity to ventilate, as the lung is likely trapped.  If no improvement with medical care, we can reassess any utility for thoracentesis to evaluate for secondary empyema, but at this time the fluid looks same as prior.   Signed,  Dulcy Fanny. Earleen Newport, DO

## 2021-04-24 NOTE — ED Notes (Addendum)
Pts room air oxygen sats 87-89%. Pt placed on oxygen 2 liters via nasal canula. Oxygen sats on 2 liters 96%

## 2021-04-24 NOTE — H&P (Signed)
History and Physical    Gerald [name] WNU:[id number] DOB: 05/30/1940 DOA: 04/23/2021  PCP: [name], DO  Patient coming from: Home.  Chief Complaint: Fever.  HPI: Gerald [name] is a 81 y.o. 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  Has been having some runny nose and congestion.  Denies any nausea vomiting diarrhea abdominal pain or chest pain.  ED Course: In the ER patient was febrile with temperature of 102.1 F tachycardic with chest x-ray showing loculated right effusion with concerning features for acute pneumonia.  COVID test was negative.  High blood cultures drawn and started on empiric antibiotics.  Lab work also showed leukopenia but neutrophil count was more than 500.  Review of Systems: As per HPI, rest all negative.   Past Medical History:  Diagnosis Date   Bradycardia    Bursitis    Carotid artery disease (HCC)    COPD (chronic obstructive pulmonary disease) (HCC)    Coronary artery disease    CTO mid-dist CX (fills from left-to-left collaterals), 99% OM2, 30% ost-dist LM, 30% prox-mid LAD, 30% ost-mid CX, 99% prox RCA (too small for PCI), medical management 02/09/20   Hypercholesterolemia    Ischemic cardiomyopathy    Lung cancer ([place])    Lung nodule    nscl ca dx'd 08/2019   Psoriasis    Tobacco abuse    Wears glasses    reading    Past Surgical History:  Procedure Laterality Date   BRONCHIAL BIOPSY  09/27/2019   Procedure: BRONCHIAL BIOPSIES;  Surgeon: Garner Nash, DO;  Location: [facility];  Service: Pulmonary;;   BRONCHIAL BRUSHINGS  09/27/2019   Procedure: BRONCHIAL BRUSHINGS;  Surgeon: Garner Nash, DO;  Location: [facility] [place];  Service: Pulmonary;;   BRONCHIAL NEEDLE ASPIRATION BIOPSY  09/27/2019   Procedure: BRONCHIAL NEEDLE ASPIRATION BIOPSIES;  Surgeon: Garner Nash, DO;  Location: [place];  Service: Pulmonary;;   BRONCHIAL NEEDLE ASPIRATION  BIOPSY  03/29/2021   Procedure: BRONCHIAL NEEDLE ASPIRATION BIOPSIES;  Surgeon: Garner Nash, DO;  Location: [facility];  Service: Pulmonary;;   BRONCHIAL WASHINGS  09/27/2019   Procedure: BRONCHIAL WASHINGS;  Surgeon: Garner Nash, DO;  Location: [facility];  Service: Pulmonary;;   BRONCHIAL WASHINGS  03/29/2021   Procedure: BRONCHIAL WASHINGS;  Surgeon: Garner Nash, DO;  Location: [facility];  Service: Pulmonary;;   COLONOSCOPY     CRYOTHERAPY  09/27/2019   Procedure: Cydney Ok;  Surgeon: Garner Nash, DO;  Location: [facility];  Service: Pulmonary;;   CRYOTHERAPY  03/29/2021   Procedure: CRYOTHERAPY;  Surgeon: Garner Nash, DO;  Location: [facility];  Service: Pulmonary;;   FIDUCIAL MARKER PLACEMENT  09/27/2019   Procedure: FIDUCIAL MARKER PLACEMENT;  Surgeon: Garner Nash, DO;  Location: [facility];  Service: Pulmonary;;   HEMOSTASIS CONTROL  09/27/2019   Procedure: HEMOSTASIS CONTROL;  Surgeon: Garner Nash, DO;  Location: [facility];  Service: Pulmonary;;   HERNIA REPAIR     umbicial- no longer as a "belly button"   RIGHT/LEFT HEART CATH AND CORONARY ANGIOGRAPHY N/A 02/09/2020   Procedure: RIGHT/LEFT HEART CATH AND CORONARY ANGIOGRAPHY;  Surgeon: Burnell Blanks, MD;  Location: [facility];  Service: Cardiovascular;  Laterality: N/A;   VIDEO BRONCHOSCOPY WITH ENDOBRONCHIAL NAVIGATION N/A 09/27/2019   Procedure: VIDEO BRONCHOSCOPY WITH ENDOBRONCHIAL NAVIGATION;  Surgeon: Garner Nash, DO;  Location: [place];  Service: Pulmonary;  Laterality: N/A;  VIDEO BRONCHOSCOPY WITH ENDOBRONCHIAL ULTRASOUND N/A 03/29/2021   Procedure: VIDEO BRONCHOSCOPY WITH ENDOBRONCHIAL ULTRASOUND;  Surgeon: Garner Nash, DO;  Location: [place];  Service: Pulmonary;  Laterality: N/A;     reports that he has been smoking cigarettes. He has a 124.00 pack-year smoking history. He has never used smokeless tobacco. He reports that he does  not currently use alcohol. He reports that he does not use drugs.  Allergies  Allergen Reactions   Cosela [Trilaciclib] Other (See Comments)    thrombophlebitis    Family History  Problem Relation Age of Onset   Heart attack Father    Mesothelioma Sister 79   Lung cancer Brother     Prior to Admission medications   Medication Sig Start Date End Date Taking? Authorizing Provider  acitretin (SORIATANE) 25 MG capsule Take 25 mg by mouth in the morning.   Yes [provider]  aspirin EC 81 MG tablet Take 1 tablet (81 mg total) by mouth daily. Swallow whole. 01/26/20  Yes Buford Dresser, MD  clobetasol cream (TEMOVATE) 4.40 % Apply 1 application topically daily as needed (Psoriasis).   Yes [provider]  Fluticasone-Umeclidin-Vilant (TRELEGY ELLIPTA) 100-62.5-25 MCG/ACT AEPB Inhale into the lungs.   Yes [provider]  Multiple Vitamins-Minerals (PRESERVISION AREDS 2 PO) Take 1 tablet by mouth in the morning and at bedtime.   Yes [provider]  niacinamide 500 MG tablet Take 500 mg by mouth daily with breakfast.   Yes [provider]  Omega-3 Fatty Acids (FISH OIL) 1200 MG CAPS Take 1,200 mg by mouth in the morning.   Yes [provider]  rosuvastatin (CRESTOR) 20 MG tablet Take 1 tablet by mouth once daily Patient taking differently: Take 20 mg by mouth every evening. 03/11/21  Yes Buford Dresser, MD  triamcinolone cream (KENALOG) 0.1 % Apply 1 application topically daily as needed (skin irritation.). 02/01/21  Yes [provider]  vitamin B-12 (CYANOCOBALAMIN) 500 MCG tablet Take 500 mcg by mouth every evening.   Yes [provider]  colchicine 0.6 MG tablet Take 1.2mg  initially followed by 0.6mg  in 1 hour.  Continue 0.6mg  daily until improvement. Patient not taking: Reported on 04/23/2021 10/24/20   [name], DO  lidocaine-prilocaine (EMLA) cream Apply to the Port-A-Cath site 30-60-minute  before chemotherapy. Patient not taking: Reported on 04/22/2021 04/16/21   Curt Bears, MD  prochlorperazine (COMPAZINE) 10 MG tablet Take 1 tablet (10 mg total) by mouth every 6 (six) hours as needed. Patient not taking: Reported on 04/22/2021 04/10/21   Heilingoetter, Cassandra L, PA-C    Physical Exam: Constitutional: Moderately built and nourished. Vitals:   04/23/21 2332 04/24/21 0000 04/24/21 0030 04/24/21 0109  BP:  112/61 111/64 107/61  Pulse:  93 93 81  Resp:  19 16 20   Temp:    98.7 F (37.1 C)  TempSrc:    Oral  SpO2: 93% 92% 92% 93%  Weight:      Height:       Eyes: Anicteric no pallor. ENMT: No discharge from the ears eyes nose and mouth. Neck: No mass felt.  No neck rigidity. Respiratory: No rhonchi or crepitations. Cardiovascular: S1-S2 heard. Abdomen: Soft nontender bowel sound present. Musculoskeletal: No edema. Skin: Has sacral decubitus ulcer. Neurologic: Alert awake oriented time place and person.  Moves all extremities. Psychiatric: Appears normal.  Normal affect.   Labs on Admission: I have personally reviewed following labs and imaging studies  CBC: Recent Labs  Lab 04/22/21 1044 04/23/21  2215  WBC 9.0 3.5*  NEUTROABS 7.9* 2.3  HGB 14.4 13.8  HCT 42.7 42.1  MCV 93.6 96.6  PLT 142* 161*   Basic Metabolic Panel: Recent Labs  Lab 04/22/21 1044 04/23/21 2215  NA 137 133*  K 4.5 4.3  CL 99 98  CO2 30 27  GLUCOSE 97 110*  BUN 24* 23  CREATININE 1.13 1.38*  CALCIUM 9.2 8.6*   GFR: Estimated Creatinine Clearance: 45.3 mL/min (A) (by C-G formula based on SCr of 1.38 mg/dL (H)). Liver Function Tests: Recent Labs  Lab 04/22/21 1044 04/23/21 2215  AST 18 19  ALT 19 19  ALKPHOS 57 55  BILITOT 1.2 1.0  PROT 7.2 7.2  ALBUMIN 4.1 3.5   No results for input(s): LIPASE, AMYLASE in the last 168 hours. No results for input(s): AMMONIA in the last 168 hours. Coagulation Profile: Recent Labs  Lab 04/23/21 2214  INR 1.1   Cardiac  Enzymes: No results for input(s): CKTOTAL, CKMB, CKMBINDEX, TROPONINI in the last 168 hours. BNP (last 3 results) No results for input(s): PROBNP in the last 8760 hours. HbA1C: No results for input(s): HGBA1C in the last 72 hours. CBG: No results for input(s): GLUCAP in the last 168 hours. Lipid Profile: No results for input(s): CHOL, HDL, LDLCALC, TRIG, CHOLHDL, LDLDIRECT in the last 72 hours. Thyroid Function Tests: No results for input(s): TSH, T4TOTAL, FREET4, T3FREE, THYROIDAB in the last 72 hours. Anemia Panel: No results for input(s): VITAMINB12, FOLATE, FERRITIN, TIBC, IRON, RETICCTPCT in the last 72 hours. Urine analysis: No results found for: COLORURINE, APPEARANCEUR, LABSPEC, PHURINE, GLUCOSEU, HGBUR, BILIRUBINUR, KETONESUR, PROTEINUR, UROBILINOGEN, NITRITE, LEUKOCYTESUR Sepsis Labs: @LABRCNTIP (procalcitonin:4,lacticidven:4) ) Recent Results (from the past 240 hour(s))  Resp Panel by RT-PCR (Flu A&B, Covid) Nasopharyngeal Swab     Status: None   Collection Time: 04/23/21 10:15 PM   Specimen: Nasopharyngeal Swab; Nasopharyngeal(NP) swabs in vial transport medium  Result Value Ref Range Status   SARS Coronavirus 2 by RT PCR NEGATIVE NEGATIVE Final    Comment: (NOTE) SARS-CoV-2 target nucleic acids are NOT DETECTED.  The SARS-CoV-2 RNA is generally detectable in upper respiratory specimens during the acute phase of infection. The lowest concentration of SARS-CoV-2 viral copies this assay can detect is 138 copies/mL. A negative result does not preclude SARS-Cov-2 infection and should not be used as the sole basis for treatment or other patient management decisions. A negative result may occur with  improper specimen collection/handling, submission of specimen other than nasopharyngeal swab, presence of viral mutation(s) within the areas targeted by this assay, and inadequate number of viral copies(<138 copies/mL). A negative result must be combined with clinical  observations, patient history, and epidemiological information. The expected result is Negative.  Fact Sheet for Patients:  EntrepreneurPulse.com.au  Fact Sheet for Healthcare Providers:  IncredibleEmployment.be  This test is no t yet approved or cleared by the [place] FDA and  has been authorized for detection and/or diagnosis of SARS-CoV-2 by FDA under an Emergency Use Authorization (EUA). This EUA will remain  in effect (meaning this test can be used) for the duration of the COVID-19 declaration under Section 564(b)(1) of the Act, 21 U.S.C.section 360bbb-3(b)(1), unless the authorization is terminated  or revoked sooner.       Influenza A by PCR NEGATIVE NEGATIVE Final   Influenza B by PCR NEGATIVE NEGATIVE Final    Comment: (NOTE) The Xpert Xpress SARS-CoV-2/FLU/RSV plus assay is intended as an aid in the diagnosis of influenza from Nasopharyngeal swab specimens and  should not be used as a sole basis for treatment. Nasal washings and aspirates are unacceptable for Xpert Xpress SARS-CoV-2/FLU/RSV testing.  Fact Sheet for Patients: EntrepreneurPulse.com.au  Fact Sheet for Healthcare Providers: IncredibleEmployment.be  This test is not yet approved or cleared by the [place] FDA and has been authorized for detection and/or diagnosis of SARS-CoV-2 by FDA under an Emergency Use Authorization (EUA). This EUA will remain in effect (meaning this test can be used) for the duration of the COVID-19 declaration under Section 564(b)(1) of the Act, 21 U.S.C. section 360bbb-3(b)(1), unless the authorization is terminated or revoked.  Performed at [facility], [place] [address], [place], [place] [address]      Radiological Exams on Admission: DG Chest 2 View  Result Date: 04/23/2021 CLINICAL DATA:  Shortness of breath fever EXAM: CHEST - 2 VIEW COMPARISON:  09/27/2019, CT  02/28/2021, PET CT 02/28/2021 FINDINGS: Volume loss in the right thorax. Fiducial markers in the right mid lung. Small loculated right pleural effusion. Worsening airspace disease in the right lung base. Right paratracheal opacity corresponding to adenopathy. Stable cardiac size. Left lung is clear. Emphysema IMPRESSION: 1. Small loculated right pleural effusion with increased airspace disease in the right lung base raising concern for acute pneumonia superimposed on previously noted areas of consolidation and possible recurrent on prior CT. 2. Emphysema.  Cardiomegaly. Electronically Signed   By: Donavan Foil M.D.   On: 04/23/2021 22:22    EKG: Independently reviewed.  Sinus tachycardia.  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  On empiric antibiotics follow cultures.  COVID test is negative.  May need thoracentesis if CT scan shows loculation of the pleural effusion Extensive stage small cell lung cancer status post chemo closely follow CBC with differential count. Sacral decubitus ulcer stage II.  We will get wound team consult. CAD denies any chest pain on aspirin and statins.  Not on beta-blockers with history of bradycardia. COPD not actively wheezing. Acute renal failure could be from dehydration.  Hydrate and follow metabolic panel.   DVT prophylaxis: Heparin. Code Status: Full code. Family Communication: Discussed with patient. Disposition Plan: Home. Consults called: None. Admission status: Observation.   Rise Patience MD [facility] Pager [phone].  If 7PM-7AM, please contact night-coverage [email or web address] Password  [email or web address]  04/24/2021, 1:56 AM

## 2021-04-24 NOTE — Progress Notes (Signed)
PHARMACIST - PHYSICIAN ORDER COMMUNICATION  CONCERNING: P&T Medication Policy on Herbal Medications  DESCRIPTION:  This patients order for:  niacinamide  has been noted.  This product(s) is classified as an herbal or natural product. Due to a lack of definitive safety studies or FDA approval, nonstandard manufacturing practices, plus the potential risk of unknown drug-drug interactions while on inpatient medications, the Pharmacy and Therapeutics Committee does not permit the use of herbal or natural products of this type within [facility].   ACTION TAKEN: The pharmacy department is unable to verify this order at this time and your patient has been informed of this safety policy. Please reevaluate patients clinical condition at discharge and address if the herbal or natural product(s) should be resumed at that time.

## 2021-04-24 NOTE — Progress Notes (Signed)
PROGRESS NOTE  Gerald [name] IEP:[id number] DOB: [birth year] DOA: 04/23/2021 PCP: [name], DO  Brief History   Gerald [name] is a 81 y.o. 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  Has been having some runny nose and congestion.  Denies any nausea vomiting diarrhea abdominal pain or chest pain.   ED Course: In the ER patient was febrile with temperature of 102.1 F tachycardic with chest x-ray showing loculated right effusion with concerning features for acute pneumonia.  COVID test was negative.  High blood cultures drawn and started on empiric antibiotics.  Lab work also showed leukopenia but neutrophil count was more than 500.  [facility] were consulted to admit the patient for febrile illness. [place] is 1265. The patient was initially given cefepime in the ED. This was changed to rocephin and azithromycin this morning.   I have discussed the patient with Dr. Vernard Gambles of IR regarding placement of chest tube for loculated pleural effusion on the left. He states that he does not think a chest tube would work and would be difficult to place. He notes that the pleural effusion is unchanged in amount in the last moth. He doubts that the effusion itself is causing the patient's worsening respiratory symptoms. He does recommend that if the patient does not improve symptomatically after a few days on antibiotics, that IR would then take a sample of the fluid for cultures. He believes that the effusion is probably partly due to infection and partly due to the lung malignancy.  Consultants    Procedures    Antibiotics   Anti-infectives (From admission, onward)    Start     Dose/Rate Route Frequency Ordered Stop   04/24/21 0600  cefTRIAXone (ROCEPHIN) 2 g in sodium chloride 0.9 % 100 mL IVPB        2 g 200 mL/hr over 30 Minutes Intravenous Every 24 hours 04/24/21 0156 04/29/21 0559   04/24/21 0200   azithromycin (ZITHROMAX) 500 mg in sodium chloride 0.9 % 250 mL IVPB        500 mg 250 mL/hr over 60 Minutes Intravenous Every 24 hours 04/24/21 0156 04/29/21 0159   04/23/21 2230  ceFEPIme (MAXIPIME) 2 g in sodium chloride 0.9 % 100 mL IVPB        2 g 200 mL/hr over 30 Minutes Intravenous  Once 04/23/21 2226 04/23/21 2336   04/23/21 2230  metroNIDAZOLE (FLAGYL) IVPB 500 mg        500 mg 100 mL/hr over 60 Minutes Intravenous  Once 04/23/21 2226 04/24/21 0050   04/23/21 2230  vancomycin (VANCOCIN) IVPB 1000 mg/200 mL premix        1,000 mg 200 mL/hr over 60 Minutes Intravenous  Once 04/23/21 2226 04/24/21 0212      Subjective  The patient is resting comfortably. No new complaints.   Objective   Vitals:  Vitals:   04/24/21 1439 04/24/21 1518  BP: (!) 119/96 127/84  Pulse: 84 81  Resp: 20 (!) 24  Temp: 98.2 F (36.8 C) 98.7 F (37.1 C)  SpO2: 99% 100%    Exam:  Constitutional:  Appears calm and comfortable Eyes:  pupils and irises appear normal Severe ectropion of eyes bilaterally. Moderate crusting on lashes. ENMT:  grossly normal hearing  Lips appear normal external ears, nose appear normal Oropharynx: mucosa, tongue,posterior pharynx appear normal Neck:  neck appears normal, no masses, normal ROM, supple no thyromegaly Respiratory:  Diminished  lung sounds on the left. Rales bilaterally.  Respiratory effort normal. No retractions or accessory muscle use Cardiovascular:  RRR, no m/r/g No LE extremity edema   Normal pedal pulses Abdomen:  Abdomen appears normal; no tenderness or masses No hernias No HSM Musculoskeletal:  Digits/nails BUE: no clubbing, cyanosis, petechiae, infection exam of joints, bones, muscles of at least one of following: head/neck, RUE, LUE, RLE, LLE   strength and tone normal, no atrophy, no abnormal movements No tenderness, masses Normal ROM, no contractures  gait and station Skin:  No rashes, lesions, ulcers palpation of skin:  no induration or nodules Neurologic:  CN 2-12 intact Sensation all 4 extremities intact Psychiatric:  Mental status Mood, affect appropriate Orientation to person, place, time  judgment and insight appear intact     I have personally reviewed the following:   Today's Data  Vitals  Lab Data  CBC, BMP  Micro Data  Urine culture has had no growth Blood cultures x 2 have had no growth.  Imaging  CT chest  Cardiology Data  EKG  Other Data    Scheduled Meds:  acitretin  25 mg Oral Daily   albuterol  2.5 mg Nebulization Q6H   aspirin EC  81 mg Oral Daily   heparin  5,000 Units Subcutaneous Q8H   nicotine  14 mg Transdermal Once   omega-3 acid ethyl esters  1 g Oral Daily   rosuvastatin  20 mg Oral QPM   vitamin B-12  500 mcg Oral QPM   Continuous Infusions:  azithromycin Stopped (04/24/21 0417)   cefTRIAXone (ROCEPHIN)  IV Stopped (04/24/21 6440)   lactated ringers 75 mL/hr at 04/24/21 0229    Principal Problem:   CAP (community acquired pneumonia) Active Problems:   Centrilobular emphysema ([place])   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  On empiric antibiotics follow cultures.  COVID test is negative.  CT chest demonstrated loculation. I have discussed the patient with Dr. Vernard Gambles of IR regarding placement of chest tube for loculated pleural effusion on the left. He states that he does not think a chest tube would work and would be difficult to place. He notes that the pleural effusion is unchanged in amount in the last moth. He doubts that the effusion itself is causing the patient's worsening respiratory symptoms. He does recommend that if the patient does not improve symptomatically after a few days on antibiotics, that IR would then  take a sample of the fluid for cultures. He believes that the effusion is probably partly due to infection and partly due to the lung malignancy. Extensive stage small cell lung cancer status post chemo closely follow CBC with differential count. Marked progression on CT chest Sacral decubitus ulcer stage II.  We will get wound team consult. CAD denies any chest pain on aspirin and statins.  Not on beta-blockers with history of bradycardia. COPD not actively wheezing. Acute renal failure due to volume depletion. Creatinine 1.38 upon presentation. Baseline creatinine appears to be around 1.0. Has been slowly increasing since December probably along with increasingly poor intake.  Hydrate. Follow creatinine, electrolytes, and volume status. Avoid hypotension and nephrotoxins.  Severe ectropion and blepharitis. Will start ocuflox.  I have seen and examined this patient  myself. I have spent 44 minutes in his evaluation and care.   DVT prophylaxis: Heparin. Code Status: Full code. Family Communication: Discussed with patient. Disposition Plan: Home.   Erasmus Bistline, DO [facility] Direct contact: see [email or web address]  7PM-7AM contact night coverage as above 04/24/2021, 4:08 PM  LOS: 0 days

## 2021-04-24 NOTE — Plan of Care (Signed)
°  Problem: Education: °Goal: Knowledge of General Education information will improve °Description: Including pain rating scale, medication(s)/side effects and non-pharmacologic comfort measures °Outcome: Progressing °  °Problem: Clinical Measurements: °Goal: Diagnostic test results will improve °Outcome: Progressing °  °Problem: Clinical Measurements: °Goal: Respiratory complications will improve °Outcome: Progressing °  °

## 2021-04-24 NOTE — ED Notes (Signed)
Pt in bed with eyes closed, pt arouses easily to verbal stim, re positioned pt. Meal tray given, pt denies pain, pt offers no complaints at this time.

## 2021-04-24 NOTE — ED Notes (Signed)
ED TO INPATIENT HANDOFF REPORT  ED Nurse Name and Phone #: Salvatore Decent Name/Age/Gender Gerald [name] 81 y.o. male Room/Bed: WA06/WA06  Code Status   Code Status: Full Code  Home/SNF/Other Home Patient oriented to: self, place, time, and situation Is this baseline? Yes   Triage Complete: Triage complete  Chief Complaint CAP (community acquired pneumonia) [J18.9]  Triage Note Fever on 100.6 at home prior to arrival. Just  began chemo infusions for lung cancer last week. Cough, runny nose, and eye drainage.    Allergies Allergies  Allergen Reactions   Cosela [Trilaciclib] Other (See Comments)    thrombophlebitis    Level of Care/Admitting Diagnosis ED Disposition     ED Disposition  Admit   Condition  --   Comment  Hospital Area: [facility] [[id number]]  Level of Care: Telemetry [5]  Admit to tele based on following criteria: Monitor for Ischemic changes  May place patient in observation at [facility] or [facility] [facility] if equivalent level of care is available:: No  Covid Evaluation: Asymptomatic Screening Protocol (No Symptoms)  Diagnosis: CAP (community acquired pneumonia) [[id number]]  Admitting Physician: Rise Patience [phone]  Attending Physician: Rise Patience [email or web address]          B Medical/Surgery History Past Medical History:  Diagnosis Date   Bradycardia    Bursitis    Carotid artery disease ([place])    COPD (chronic obstructive pulmonary disease) ([place])    Coronary artery disease    CTO mid-dist CX (fills from left-to-left collaterals), 99% OM2, 30% ost-dist LM, 30% prox-mid LAD, 30% ost-mid CX, 99% prox RCA (too small for PCI), medical management 02/09/20   Hypercholesterolemia    Ischemic cardiomyopathy    Lung cancer ([place])    Lung nodule    nscl ca dx'd 08/2019   Psoriasis    Tobacco abuse    Wears glasses    reading   Past Surgical History:  Procedure Laterality Date   BRONCHIAL BIOPSY  09/27/2019   Procedure: BRONCHIAL  BIOPSIES;  Surgeon: Garner Nash, DO;  Location: [facility];  Service: Pulmonary;;   BRONCHIAL BRUSHINGS  09/27/2019   Procedure: BRONCHIAL BRUSHINGS;  Surgeon: Garner Nash, DO;  Location: [facility];  Service: Pulmonary;;   BRONCHIAL NEEDLE ASPIRATION BIOPSY  09/27/2019   Procedure: BRONCHIAL NEEDLE ASPIRATION BIOPSIES;  Surgeon: Garner Nash, DO;  Location: [place];  Service: Pulmonary;;   BRONCHIAL NEEDLE ASPIRATION BIOPSY  03/29/2021   Procedure: BRONCHIAL NEEDLE ASPIRATION BIOPSIES;  Surgeon: Garner Nash, DO;  Location: [facility];  Service: Pulmonary;;   BRONCHIAL WASHINGS  09/27/2019   Procedure: BRONCHIAL WASHINGS;  Surgeon: Garner Nash, DO;  Location: [facility];  Service: Pulmonary;;   BRONCHIAL WASHINGS  03/29/2021   Procedure: BRONCHIAL WASHINGS;  Surgeon: Garner Nash, DO;  Location: [facility];  Service: Pulmonary;;   COLONOSCOPY     CRYOTHERAPY  09/27/2019   Procedure: Cydney Ok;  Surgeon: Garner Nash, DO;  Location: [facility];  Service: Pulmonary;;   CRYOTHERAPY  03/29/2021   Procedure: CRYOTHERAPY;  Surgeon: Garner Nash, DO;  Location: [facility];  Service: Pulmonary;;   FIDUCIAL MARKER PLACEMENT  09/27/2019   Procedure: FIDUCIAL MARKER PLACEMENT;  Surgeon: Garner Nash, DO;  Location: [facility];  Service: Pulmonary;;   HEMOSTASIS CONTROL  09/27/2019   Procedure: HEMOSTASIS CONTROL;  Surgeon: Garner Nash, DO;  Location: [facility];  Service: Pulmonary;;   HERNIA REPAIR     umbicial- no longer  as a "belly button"   RIGHT/LEFT HEART CATH AND CORONARY ANGIOGRAPHY N/A 02/09/2020   Procedure: RIGHT/LEFT HEART CATH AND CORONARY ANGIOGRAPHY;  Surgeon: Burnell Blanks, MD;  Location: [facility];  Service: Cardiovascular;  Laterality: N/A;   VIDEO BRONCHOSCOPY WITH ENDOBRONCHIAL NAVIGATION N/A 09/27/2019   Procedure: VIDEO BRONCHOSCOPY WITH ENDOBRONCHIAL NAVIGATION;  Surgeon: Garner Nash,  DO;  Location: [facility];  Service: Pulmonary;  Laterality: N/A;   VIDEO BRONCHOSCOPY WITH ENDOBRONCHIAL ULTRASOUND N/A 03/29/2021   Procedure: VIDEO BRONCHOSCOPY WITH ENDOBRONCHIAL ULTRASOUND;  Surgeon: Garner Nash, DO;  Location: [facility];  Service: Pulmonary;  Laterality: N/A;     A IV Location/Drains/Wounds Patient Lines/Drains/Airways Status     Active Line/Drains/Airways     Name Placement date Placement time Site Days   Peripheral IV 04/23/21 20 G 1" Anterior;Distal;Right;Upper Arm 04/23/21  2247  Arm  1            Intake/Output Last 24 hours  Intake/Output Summary (Last 24 hours) at 04/24/2021 1436 Last data filed at 04/24/2021 3716 Gross per 24 hour  Intake 1522.76 ml  Output 450 ml  Net 1072.76 ml    Labs/Imaging Results for orders placed or performed during the hospital encounter of 04/23/21 (from the past 48 hour(s))  Protime-INR     Status: None   Collection Time: 04/23/21 10:14 PM  Result Value Ref Range   Prothrombin Time 14.2 11.4 - 15.2 seconds   INR 1.1 0.8 - 1.2    Comment: (NOTE) INR goal varies based on device and disease states. Performed at [facility], [place] [address] [place], [place] [address]   APTT     Status: None   Collection Time: 04/23/21 10:14 PM  Result Value Ref Range   aPTT 29 24 - 36 seconds    Comment: Performed at [facility], [place] [address], [place], [place] [address]  Troponin I (High Sensitivity)     Status: None   Collection Time: 04/23/21 10:14 PM  Result Value Ref Range   Troponin I (High Sensitivity) 11 <18 ng/L    Comment: (NOTE) Elevated high sensitivity troponin I (hsTnI) values and significant  changes across serial measurements may suggest ACS but many other  chronic and acute conditions are known to elevate hsTnI results.  Refer to the "Links" section for chest pain algorithms and additional  guidance. Performed at [facility], [place]  [address], [place], [place] [address]   CBC with Differential     Status: Abnormal   Collection Time: 04/23/21 10:15 PM  Result Value Ref Range   WBC 3.5 (L) 4.0 - 10.5 K/uL   RBC 4.36 4.22 - 5.81 MIL/uL   Hemoglobin 13.8 13.0 - 17.0 g/dL   HCT 42.1 39.0 - 52.0 %   MCV 96.6 80.0 - 100.0 fL   MCH 31.7 26.0 - 34.0 pg   MCHC 32.8 30.0 - 36.0 g/dL   RDW 13.7 11.5 - 15.5 %   Platelets 111 (L) 150 - 400 K/uL    Comment: SPECIMEN CHECKED FOR CLOTS Immature Platelet Fraction may be clinically indicated, consider ordering this additional test [id number] REPEATED TO VERIFY PLATELET COUNT CONFIRMED BY SMEAR    nRBC 0.0 0.0 - 0.2 %   Neutrophils Relative % 65 %   Neutro Abs 2.3 1.7 - 7.7 K/uL   Lymphocytes Relative 22 %   Lymphs Abs 0.8 0.7 - 4.0 K/uL   Monocytes Relative 3 %   Monocytes Absolute  0.1 0.1 - 1.0 K/uL   Eosinophils Relative 1 %   Eosinophils Absolute 0.0 0.0 - 0.5 K/uL   Basophils Relative 1 %   Basophils Absolute 0.0 0.0 - 0.1 K/uL   Immature Granulocytes 8 %   Abs Immature Granulocytes 0.29 (H) 0.00 - 0.07 K/uL   Reactive, Benign Lymphocytes PRESENT     Comment: Performed at [facility], [place] [address], [place], [place] [address]  Comprehensive metabolic panel     Status: Abnormal   Collection Time: 04/23/21 10:15 PM  Result Value Ref Range   Sodium 133 (L) 135 - 145 mmol/L   Potassium 4.3 3.5 - 5.1 mmol/L   Chloride 98 98 - 111 mmol/L   CO2 27 22 - 32 mmol/L   Glucose, Bld 110 (H) 70 - 99 mg/dL    Comment: Glucose reference range applies only to samples taken after fasting for at least 8 hours.   BUN 23 8 - 23 mg/dL   Creatinine, Ser 1.38 (H) 0.61 - 1.24 mg/dL   Calcium 8.6 (L) 8.9 - 10.3 mg/dL   Total Protein 7.2 6.5 - 8.1 g/dL   Albumin 3.5 3.5 - 5.0 g/dL   AST 19 15 - 41 U/L   ALT 19 0 - 44 U/L   Alkaline Phosphatase 55 38 - 126 U/L   Total Bilirubin 1.0 0.3 - 1.2 mg/dL   GFR, Estimated 51 (L) >60 mL/min    Comment: (NOTE) Calculated  using the CKD-EPI Creatinine Equation (2021)    Anion gap 8 5 - 15    Comment: Performed at [facility], [place] [address], [place], [place] [address]  Lactic acid, plasma     Status: None   Collection Time: 04/23/21 10:15 PM  Result Value Ref Range   Lactic Acid, Venous 1.3 0.5 - 1.9 mmol/L    Comment: Performed at [facility], [place] [address] [place], [place] [address]  Resp Panel by RT-PCR (Flu A&B, Covid) Nasopharyngeal Swab     Status: None   Collection Time: 04/23/21 10:15 PM   Specimen: Nasopharyngeal Swab; Nasopharyngeal(NP) swabs in vial transport medium  Result Value Ref Range   SARS Coronavirus 2 by RT PCR NEGATIVE NEGATIVE    Comment: (NOTE) SARS-CoV-2 target nucleic acids are NOT DETECTED.  The SARS-CoV-2 RNA is generally detectable in upper respiratory specimens during the acute phase of infection. The lowest concentration of SARS-CoV-2 viral copies this assay can detect is 138 copies/mL. A negative result does not preclude SARS-Cov-2 infection and should not be used as the sole basis for treatment or other patient management decisions. A negative result may occur with  improper specimen collection/handling, submission of specimen other than nasopharyngeal swab, presence of viral mutation(s) within the areas targeted by this assay, and inadequate number of viral copies(<138 copies/mL). A negative result must be combined with clinical observations, patient history, and epidemiological information. The expected result is Negative.  Fact Sheet for Patients:  EntrepreneurPulse.com.au  Fact Sheet for Healthcare Providers:  IncredibleEmployment.be  This test is no t yet approved or cleared by the [place] FDA and  has been authorized for detection and/or diagnosis of SARS-CoV-2 by FDA under an Emergency Use Authorization (EUA). This EUA will remain  in effect (meaning this test can be used) for  the duration of the COVID-19 declaration under Section 564(b)(1) of the Act, 21 U.S.C.section 360bbb-3(b)(1), unless the authorization is terminated  or revoked sooner.       Influenza A  by PCR NEGATIVE NEGATIVE   Influenza B by PCR NEGATIVE NEGATIVE    Comment: (NOTE) The Xpert Xpress SARS-CoV-2/FLU/RSV plus assay is intended as an aid in the diagnosis of influenza from Nasopharyngeal swab specimens and should not be used as a sole basis for treatment. Nasal washings and aspirates are unacceptable for Xpert Xpress SARS-CoV-2/FLU/RSV testing.  Fact Sheet for Patients: EntrepreneurPulse.com.au  Fact Sheet for Healthcare Providers: IncredibleEmployment.be  This test is not yet approved or cleared by the [place] FDA and has been authorized for detection and/or diagnosis of SARS-CoV-2 by FDA under an Emergency Use Authorization (EUA). This EUA will remain in effect (meaning this test can be used) for the duration of the COVID-19 declaration under Section 564(b)(1) of the Act, 21 U.S.C. section 360bbb-3(b)(1), unless the authorization is terminated or revoked.  Performed at [facility], [place] [address] [place], [place] [address]   Blood culture (routine x 2)     Status: None (Preliminary result)   Collection Time: 04/23/21 10:15 PM   Specimen: BLOOD  Result Value Ref Range   Specimen Description      BLOOD RIGHT ANTECUBITAL Performed at [facility], [place] [address], [place], [place] [address]    Special Requests      BOTTLES DRAWN AEROBIC AND ANAEROBIC Blood Culture adequate volume Performed at [place] [address] [place], [place] [address]    Culture PENDING    Report Status PENDING   Lactic acid, plasma     Status: None   Collection Time: 04/24/21 12:13 AM  Result Value Ref Range   Lactic Acid, Venous 0.8 0.5 - 1.9 mmol/L    Comment: Performed at [facility], [place] [address] [place], [place] [address]  Troponin I (High Sensitivity)     Status: None   Collection Time: 04/24/21 12:13 AM  Result Value Ref Range   Troponin I (High Sensitivity) 10 <18 ng/L    Comment: (NOTE) Elevated high sensitivity troponin I (hsTnI) values and significant  changes across serial measurements may suggest ACS but many other  chronic and acute conditions are known to elevate hsTnI results.  Refer to the "Links" section for chest pain algorithms and additional  guidance. Performed at [facility], [place] [address], [place], [place] [address]   Urinalysis, Routine w reflex microscopic Urine, Clean Catch     Status: None   Collection Time: 04/24/21  3:42 AM  Result Value Ref Range   Color, Urine YELLOW YELLOW   APPearance CLEAR CLEAR   Specific Gravity, Urine 1.016 1.005 - 1.030   pH 6.0 5.0 - 8.0   Glucose, UA NEGATIVE NEGATIVE mg/dL   Hgb urine dipstick NEGATIVE NEGATIVE   Bilirubin Urine NEGATIVE NEGATIVE   Ketones, ur NEGATIVE NEGATIVE mg/dL   Protein, ur NEGATIVE NEGATIVE mg/dL   Nitrite NEGATIVE NEGATIVE   Leukocytes,Ua NEGATIVE NEGATIVE    Comment: Performed at [facility], [place] [address], [place], [place] [address]  Urine Culture     Status: None   Collection Time: 04/24/21  3:42 AM   Specimen: In/Out Cath Urine  Result Value Ref Range   Specimen Description      IN/OUT CATH URINE Performed at [facility], [place] [address] [place], [place] [address]    Special Requests      NONE Performed at [facility], [place] [address], [place], [place] [address]    Culture  NO GROWTH Performed at [facility], [place] [address] [place], [place] [address]    Report Status 04/24/2021 FINAL   HIV Antibody (routine testing w rflx)     Status: None   Collection Time: 04/24/21  4:22 AM  Result Value Ref Range   HIV Screen 4th Generation wRfx Non Reactive Non Reactive     Comment: Performed at [facility], [place] [address] [place], [place] [address]  CBC WITH DIFFERENTIAL     Status: Abnormal   Collection Time: 04/24/21  4:22 AM  Result Value Ref Range   WBC 2.3 (L) 4.0 - 10.5 K/uL   RBC 3.98 (L) 4.22 - 5.81 MIL/uL   Hemoglobin 12.3 (L) 13.0 - 17.0 g/dL   HCT 38.4 (L) 39.0 - 52.0 %   MCV 96.5 80.0 - 100.0 fL   MCH 30.9 26.0 - 34.0 pg   MCHC 32.0 30.0 - 36.0 g/dL   RDW 13.6 11.5 - 15.5 %   Platelets 94 (L) 150 - 400 K/uL    Comment: SPECIMEN CHECKED FOR CLOTS Immature Platelet Fraction may be clinically indicated, consider ordering this additional test [id number] CONSISTENT WITH PREVIOUS RESULT REPEATED TO VERIFY    nRBC 0.0 0.0 - 0.2 %   Neutrophils Relative % 55 %   Neutro Abs 1.2 (L) 1.7 - 7.7 K/uL   Lymphocytes Relative 25 %   Lymphs Abs 0.6 (L) 0.7 - 4.0 K/uL   Monocytes Relative 10 %   Monocytes Absolute 0.2 0.1 - 1.0 K/uL   Eosinophils Relative 1 %   Eosinophils Absolute 0.0 0.0 - 0.5 K/uL   Basophils Relative 2 %   Basophils Absolute 0.0 0.0 - 0.1 K/uL   WBC Morphology MILD LEFT SHIFT (1-5% METAS, OCC MYELO, OCC BANDS)    Immature Granulocytes 7 %   Abs Immature Granulocytes 0.16 (H) 0.00 - 0.07 K/uL   Reactive, Benign Lymphocytes PRESENT     Comment: Performed at [facility], [place] [address], [place], [place] [address]  Basic metabolic panel     Status: Abnormal   Collection Time: 04/24/21  4:22 AM  Result Value Ref Range   Sodium 135 135 - 145 mmol/L   Potassium 3.8 3.5 - 5.1 mmol/L   Chloride 101 98 - 111 mmol/L   CO2 28 22 - 32 mmol/L   Glucose, Bld 118 (H) 70 - 99 mg/dL    Comment: Glucose reference range applies only to samples taken after fasting for at least 8 hours.   BUN 22 8 - 23 mg/dL   Creatinine, Ser 1.06 0.61 - 1.24 mg/dL   Calcium 8.2 (L) 8.9 - 10.3 mg/dL   GFR, Estimated >60 >60 mL/min    Comment: (NOTE) Calculated using the CKD-EPI Creatinine Equation (2021)    Anion gap 6 5 - 15     Comment: Performed at [facility], [place] [address], [place], [place] [address]   DG Chest 2 View  Result Date: 04/23/2021 CLINICAL DATA:  Shortness of breath fever EXAM: CHEST - 2 VIEW COMPARISON:  09/27/2019, CT 02/28/2021, PET CT 02/28/2021 FINDINGS: Volume loss in the right thorax. Fiducial markers in the right mid lung. Small loculated right pleural effusion. Worsening airspace disease in the right lung base. Right paratracheal opacity corresponding to adenopathy. Stable cardiac size. Left lung is clear. Emphysema IMPRESSION: 1. Small loculated right pleural effusion with increased airspace disease in the right lung base raising concern for acute pneumonia superimposed on previously noted  areas of consolidation and possible recurrent on prior CT. 2. Emphysema.  Cardiomegaly. Electronically Signed   By: Donavan Foil M.D.   On: 04/23/2021 22:22   CT CHEST WO CONTRAST  Result Date: 04/24/2021 CLINICAL DATA:  81 year old male with history of fever and congestion. Abnormal chest x-ray. EXAM: CT CHEST WITHOUT CONTRAST TECHNIQUE: Multidetector CT imaging of the chest was performed following the standard protocol without IV contrast. RADIATION DOSE REDUCTION: This exam was performed according to the departmental dose-optimization program which includes automated exposure control, adjustment of the mA and/or kV according to patient size and/or use of iterative reconstruction technique. COMPARISON:  Chest CT 02/28/2021.  PET-CT 03/13/2021. FINDINGS: Cardiovascular: Heart size is normal. There is no significant pericardial fluid, thickening or pericardial calcification. There is aortic atherosclerosis, as well as atherosclerosis of the great vessels of the mediastinum and the coronary arteries, including calcified atherosclerotic plaque in the left main, left anterior descending, left circumflex and right coronary arteries. Calcifications of the aortic valve and mitral annulus.  Mediastinum/Nodes: Worsening mediastinal lymphadenopathy, most notable for a large right paratracheal nodal mass which measures up to 4.1 x 3.9 cm inferiorly (axial image 53 of series 2), as compared with 3.5 x 3.8 cm on the recent prior study from 02/28/2021. Prominent soft tissue in the right hilar region is also noted on today's examination, but difficult to discern from adjacent vasculature on today's noncontrast CT examination. Right hilar lymphadenopathy is suspected, however. Esophagus is unremarkable in appearance. No axillary lymphadenopathy. Asymmetric heterogeneous enlargement of the right lobe of the thyroid gland which demonstrates internal calcifications, similar to numerous prior studies and not hypermetabolic on remote prior PET-CT 09/12/2019, presumably an asymmetric goiter. Lungs/Pleura: Again noted is a mass in the anteromedial aspect of the right upper lobe which appears to invade both the anterior chest wall and the anterior mediastinum (axial image 55 of series 2 and coronal image 62 of series 6), which has enlarged compared to the prior study, currently measuring 5.6 x 3.0 x 3.3 cm (previously 2.4 x 3.9 x 2.3 cm on prior chest CT 02/28/2021). Extensive nodular septal thickening, thickening of the peribronchovascular interstitium and regional architectural distortion is noted throughout the right lung on today's examination, new compared to the prior study, likely to reflect progressive lymphangitic spread of tumor throughout the right lung. Minimal septal thickening is also noted in the left lung. Small thick-walled right-sided pleural effusion slightly larger than the prior study, likely malignant. No left pleural effusion. Upper Abdomen: Aortic atherosclerosis. Tiny calcified gallstones lying dependently in the gallbladder. Numerous colonic diverticulae are noted in the visualized portions of the distal transverse colon. Musculoskeletal: There are no aggressive appearing lytic or blastic  lesions noted in the visualized portions of the skeleton. IMPRESSION: 1. Today's study demonstrates definitive progression of disease with enlargement of the right upper lobe mass which again appears to invade the right chest wall and anterior mediastinum, worsening right hilar and mediastinal lymphadenopathy, enlarging thick-walled presumably malignant right pleural effusion, and interval development of what appears to be lymphangitic spread of tumor throughout the right lung, as detailed above. 2. Aortic atherosclerosis, in addition to left main and three-vessel coronary artery disease. 3. Cholelithiasis. 4. Colonic diverticulosis. 5. Additional incidental findings, as above. Aortic Atherosclerosis (ICD10-I70.0). Electronically Signed   By: Vinnie Langton M.D.   On: 04/24/2021 07:17    Pending Labs Unresulted Labs (From admission, onward)     Start     Ordered   04/23/21 2157  Blood culture (routine  x 2)  BLOOD CULTURE X 2,   STAT      04/23/21 2157            Vitals/Pain Today's Vitals   04/24/21 1228 04/24/21 1323 04/24/21 1335 04/24/21 1400  BP:  124/63  (!) 107/55  Pulse: 90 77  76  Resp: 20 (!) 22    Temp: 98.6 F (37 C) 98.4 F (36.9 C)    TempSrc: Oral Oral    SpO2: 99% 100%  98%  Weight:      Height:      PainSc: 0-No pain 0-No pain 0-No pain     Isolation Precautions No active isolations  Medications Medications  nicotine (NICODERM CQ - dosed in mg/24 hours) patch 14 mg (14 mg Transdermal Patch Applied 04/23/21 2347)  lactated ringers infusion ( Intravenous New Bag/Given 04/24/21 0229)  aspirin EC tablet 81 mg (81 mg Oral Given 04/24/21 0910)  rosuvastatin (CRESTOR) tablet 20 mg (has no administration in time range)  vitamin B-12 (CYANOCOBALAMIN) tablet 500 mcg (has no administration in time range)  omega-3 acid ethyl esters (LOVAZA) capsule 1 g (1 g Oral Given 04/24/21 0911)  acitretin (SORIATANE) capsule 25 mg (25 mg Oral Not Given 04/24/21 1052)  cefTRIAXone  (ROCEPHIN) 2 g in sodium chloride 0.9 % 100 mL IVPB (0 g Intravenous Stopped 04/24/21 0643)  azithromycin (ZITHROMAX) 500 mg in sodium chloride 0.9 % 250 mL IVPB (0 mg Intravenous Stopped 04/24/21 0417)  heparin injection 5,000 Units (5,000 Units Subcutaneous Given 04/24/21 1332)  acetaminophen (TYLENOL) tablet 650 mg (has no administration in time range)    Or  acetaminophen (TYLENOL) suppository 650 mg (has no administration in time range)  albuterol (PROVENTIL) (2.5 MG/3ML) 0.083% nebulizer solution 2.5 mg (2.5 mg Nebulization Given 04/24/21 1326)  albuterol (PROVENTIL) (2.5 MG/3ML) 0.083% nebulizer solution 2.5 mg (has no administration in time range)  ceFEPIme (MAXIPIME) 2 g in sodium chloride 0.9 % 100 mL IVPB (0 g Intravenous Stopped 04/23/21 2336)  metroNIDAZOLE (FLAGYL) IVPB 500 mg (0 mg Intravenous Stopped 04/24/21 0050)  vancomycin (VANCOCIN) IVPB 1000 mg/200 mL premix (0 mg Intravenous Stopped 04/24/21 0212)  lactated ringers bolus 500 mL (0 mLs Intravenous Stopped 04/24/21 0013)  ipratropium-albuterol (DUONEB) 0.5-2.5 (3) MG/3ML nebulizer solution 3 mL (3 mLs Nebulization Given 04/23/21 2332)  acetaminophen (TYLENOL) tablet 650 mg (650 mg Oral Given 04/23/21 2257)    Mobility walks Low fall risk      R Recommendations: See Admitting Provider Note  Report given to:   Additional Notes:

## 2021-04-25 DIAGNOSIS — N179 Acute kidney failure, unspecified: Secondary | ICD-10-CM

## 2021-04-25 DIAGNOSIS — E869 Volume depletion, unspecified: Secondary | ICD-10-CM | POA: Diagnosis present

## 2021-04-25 DIAGNOSIS — Z801 Family history of malignant neoplasm of trachea, bronchus and lung: Secondary | ICD-10-CM | POA: Diagnosis not present

## 2021-04-25 DIAGNOSIS — D61810 Antineoplastic chemotherapy induced pancytopenia: Secondary | ICD-10-CM | POA: Diagnosis present

## 2021-04-25 DIAGNOSIS — J9600 Acute respiratory failure, unspecified whether with hypoxia or hypercapnia: Secondary | ICD-10-CM | POA: Diagnosis present

## 2021-04-25 DIAGNOSIS — C3411 Malignant neoplasm of upper lobe, right bronchus or lung: Secondary | ICD-10-CM | POA: Diagnosis present

## 2021-04-25 DIAGNOSIS — H01009 Unspecified blepharitis unspecified eye, unspecified eyelid: Secondary | ICD-10-CM

## 2021-04-25 DIAGNOSIS — I2510 Atherosclerotic heart disease of native coronary artery without angina pectoris: Secondary | ICD-10-CM | POA: Diagnosis present

## 2021-04-25 DIAGNOSIS — I255 Ischemic cardiomyopathy: Secondary | ICD-10-CM | POA: Diagnosis present

## 2021-04-25 DIAGNOSIS — Z20822 Contact with and (suspected) exposure to covid-19: Secondary | ICD-10-CM | POA: Diagnosis present

## 2021-04-25 DIAGNOSIS — E7800 Pure hypercholesterolemia, unspecified: Secondary | ICD-10-CM | POA: Diagnosis present

## 2021-04-25 DIAGNOSIS — J189 Pneumonia, unspecified organism: Secondary | ICD-10-CM | POA: Diagnosis present

## 2021-04-25 DIAGNOSIS — J90 Pleural effusion, not elsewhere classified: Secondary | ICD-10-CM | POA: Insufficient documentation

## 2021-04-25 DIAGNOSIS — Z888 Allergy status to other drugs, medicaments and biological substances status: Secondary | ICD-10-CM | POA: Diagnosis not present

## 2021-04-25 DIAGNOSIS — R339 Retention of urine, unspecified: Secondary | ICD-10-CM | POA: Diagnosis not present

## 2021-04-25 DIAGNOSIS — H02109 Unspecified ectropion of unspecified eye, unspecified eyelid: Secondary | ICD-10-CM

## 2021-04-25 DIAGNOSIS — J910 Malignant pleural effusion: Secondary | ICD-10-CM | POA: Diagnosis present

## 2021-04-25 DIAGNOSIS — A419 Sepsis, unspecified organism: Secondary | ICD-10-CM

## 2021-04-25 DIAGNOSIS — J432 Centrilobular emphysema: Secondary | ICD-10-CM | POA: Diagnosis present

## 2021-04-25 DIAGNOSIS — F17210 Nicotine dependence, cigarettes, uncomplicated: Secondary | ICD-10-CM | POA: Diagnosis present

## 2021-04-25 DIAGNOSIS — R509 Fever, unspecified: Secondary | ICD-10-CM | POA: Diagnosis present

## 2021-04-25 DIAGNOSIS — L89152 Pressure ulcer of sacral region, stage 2: Secondary | ICD-10-CM | POA: Diagnosis present

## 2021-04-25 DIAGNOSIS — T451X5A Adverse effect of antineoplastic and immunosuppressive drugs, initial encounter: Secondary | ICD-10-CM | POA: Diagnosis present

## 2021-04-25 DIAGNOSIS — L409 Psoriasis, unspecified: Secondary | ICD-10-CM | POA: Diagnosis present

## 2021-04-25 DIAGNOSIS — Z8249 Family history of ischemic heart disease and other diseases of the circulatory system: Secondary | ICD-10-CM | POA: Diagnosis not present

## 2021-04-25 DIAGNOSIS — R7989 Other specified abnormal findings of blood chemistry: Secondary | ICD-10-CM | POA: Diagnosis present

## 2021-04-25 DIAGNOSIS — E871 Hypo-osmolality and hyponatremia: Secondary | ICD-10-CM | POA: Diagnosis present

## 2021-04-25 LAB — BASIC METABOLIC PANEL
Anion gap: 7 (ref 5–15)
BUN: 15 mg/dL (ref 8–23)
CO2: 27 mmol/L (ref 22–32)
Calcium: 8.3 mg/dL — ABNORMAL LOW (ref 8.9–10.3)
Chloride: 101 mmol/L (ref 98–111)
Creatinine, Ser: 1.02 mg/dL (ref 0.61–1.24)
GFR, Estimated: >60 mL/min (ref >60)
Glucose, Bld: 119 mg/dL — ABNORMAL HIGH (ref 70–99)
Potassium: 4.3 mmol/L (ref 3.5–5.1)
Sodium: 135 mmol/L (ref 135–145)

## 2021-04-25 LAB — CBC WITH DIFFERENTIAL/PLATELET
Abs Immature Granulocytes: 0.01 10*3/uL (ref 0.00–0.07)
Basophils Absolute: 0 10*3/uL (ref 0.0–0.1)
Basophils Relative: 1 %
Eosinophils Absolute: 0.1 10*3/uL (ref 0.0–0.5)
Eosinophils Relative: 2 %
HCT: 35.7 % — ABNORMAL LOW (ref 39.0–52.0)
Hemoglobin: 11.9 g/dL — ABNORMAL LOW (ref 13.0–17.0)
Immature Granulocytes: 1 %
Lymphocytes Relative: 16 %
Lymphs Abs: 0.4 10*3/uL — ABNORMAL LOW (ref 0.7–4.0)
MCH: 32 pg (ref 26.0–34.0)
MCHC: 33.3 g/dL (ref 30.0–36.0)
MCV: 96 fL (ref 80.0–100.0)
Monocytes Absolute: 0.3 10*3/uL (ref 0.1–1.0)
Monocytes Relative: 14 %
Neutro Abs: 1.5 10*3/uL — ABNORMAL LOW (ref 1.7–7.7)
Neutrophils Relative %: 66 %
Platelets: 84 10*3/uL — ABNORMAL LOW (ref 150–400)
RBC: 3.72 MIL/uL — ABNORMAL LOW (ref 4.22–5.81)
RDW: 13.5 % (ref 11.5–15.5)
WBC: 2.2 10*3/uL — ABNORMAL LOW (ref 4.0–10.5)
nRBC: 0 % (ref 0.0–0.2)

## 2021-04-25 MED ORDER — IPRATROPIUM-ALBUTEROL 0.5-2.5 (3) MG/3ML IN SOLN
3.0000 mL | RESPIRATORY_TRACT | Status: DC
  Administered 2021-04-25 (×2): 3 mL via RESPIRATORY_TRACT
  Filled 2021-04-25 (×2): qty 3

## 2021-04-25 MED ORDER — ERYTHROMYCIN 5 MG/GM OP OINT
TOPICAL_OINTMENT | Freq: Every day | OPHTHALMIC | Status: DC
  Filled 2021-04-25: qty 1
  Filled 2021-04-25: qty 3.5

## 2021-04-25 MED ORDER — IPRATROPIUM-ALBUTEROL 0.5-2.5 (3) MG/3ML IN SOLN
3.0000 mL | Freq: Four times a day (QID) | RESPIRATORY_TRACT | Status: DC
  Administered 2021-04-25 – 2021-04-27 (×9): 3 mL via RESPIRATORY_TRACT
  Filled 2021-04-25 (×9): qty 3

## 2021-04-25 MED ORDER — NICOTINE 21 MG/24HR TD PT24
21.0000 mg | MEDICATED_PATCH | Freq: Every day | TRANSDERMAL | Status: DC
  Administered 2021-04-25 – 2021-04-30 (×8): 21 mg via TRANSDERMAL
  Filled 2021-04-25 (×8): qty 1

## 2021-04-25 MED ORDER — POLYVINYL ALCOHOL 1.4 % OP SOLN
2.0000 [drp] | Freq: Four times a day (QID) | OPHTHALMIC | Status: DC
  Administered 2021-04-25 – 2021-04-30 (×17): 2 [drp] via OPHTHALMIC
  Filled 2021-04-25: qty 15

## 2021-04-25 MED ORDER — BISACODYL 10 MG RE SUPP: 10.0000 mg | Freq: Every day | RECTAL | Status: DC | PRN

## 2021-04-25 MED ORDER — NICOTINE POLACRILEX 2 MG MT GUM
2.0000 mg | CHEWING_GUM | OROMUCOSAL | Status: DC | PRN
  Administered 2021-04-27: 2 mg via ORAL
  Filled 2021-04-25 (×2): qty 1

## 2021-04-25 MED ORDER — POLYETHYLENE GLYCOL 3350 17 G PO PACK
17.0000 g | PACK | Freq: Two times a day (BID) | ORAL | Status: DC
  Administered 2021-04-25 – 2021-04-27 (×3): 17 g via ORAL
  Filled 2021-04-25 (×3): qty 1

## 2021-04-25 NOTE — Progress Notes (Signed)
PROGRESS NOTE    Jontrell Bushong  JEH:[id number] DOB: Sep 18, 1940 DOA: 04/23/2021 PCP: [name], DO  Chief Complaint  Patient presents with   Fever    Brief Narrative:  Ryu Cerreta is Lasheba Stevens 81 y.o. 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  Has been having some runny nose and congestion.  Denies any nausea vomiting diarrhea abdominal pain or chest pain.   ED Course: In the ER patient was febrile with temperature of 102.1 F tachycardic with chest x-ray showing loculated right effusion with concerning features for acute pneumonia.  COVID test was negative.  High blood cultures drawn and started on empiric antibiotics.  Lab work also showed leukopenia but neutrophil count was more than 500.    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)   Centrilobular emphysema (HCC)   Pressure ulcer, stage 2 (HCC)   AKI (acute kidney injury) ([place])   Ectropion   Blepharitis   * CAP (community acquired pneumonia)- (present on admission) Presented with fever after chemo CT chest 1/25 with progression of disease with enlargement of RUL mass which appears to invade the right chest wall and anterior mediastinum, worsening R hilar and mediastinal LAD, enlarging thick walled, presumably malignant R pleural effusion and interval development of what appears to be lymphangitic spread of tumor throughout R lung IR didn't think chest tube would be indicated Will request thora to eval effusion, r/u infection, discussed with oncology Continue ceftriaxone and azithromycin for now Blood cx ng, urine cx ng   Sepsis ([place]) Fever, leukopenia, and infection Treat as above  Pleural effusion Request thora to eval for presence of infection  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 Julien Nordmann outpatient CT chest with findings concerning for progression of disease Will need to follow up after completion of treatment for infection  Centrilobular emphysema ([place])- (present on admission) Continue nebs, may need to consider steroids   Pressure ulcer, stage 2 ([place])- (present on admission) Wound c/s  AKI (acute kidney injury) ([place]) improved  Blepharitis Nightly erythromycin ointment, follow ophtho follow up  Ectropion Follow with ophtho outpatient Nightly ointment   DVT prophylaxis: heparin -> SCD while awaiting IR eval Code Status: full Family Communication: wife, daughter Disposition:   Status is: Inpatient  Remains inpatient appropriate because: need for IR procedure       Consultants:  IR  Procedures:  none  Antimicrobials:  Anti-infectives (From admission, onward)    Start     Dose/Rate Route Frequency Ordered Stop   04/24/21 0600  cefTRIAXone (ROCEPHIN) 2 g in sodium chloride 0.9 % 100 mL IVPB        2 g 200 mL/hr over 30 Minutes Intravenous Every 24 hours 04/24/21 0156 04/29/21 0559   04/24/21 0200  azithromycin (ZITHROMAX) 500 mg in sodium chloride 0.9 % 250 mL IVPB        500 mg 250 mL/hr over 60 Minutes Intravenous Every 24 hours 04/24/21 0156 04/29/21 0159   04/23/21 2230  ceFEPIme (MAXIPIME) 2 g in sodium chloride 0.9 % 100 mL IVPB        2 g 200 mL/hr over 30 Minutes Intravenous  Once 04/23/21 2226 04/23/21 2336   04/23/21 2230  metroNIDAZOLE (FLAGYL) IVPB 500  mg        500 mg 100 mL/hr over 60 Minutes Intravenous  Once 04/23/21 2226 04/24/21 0050   04/23/21 2230  vancomycin (VANCOCIN) IVPB 1000 mg/200 mL premix        1,000 mg 200 mL/hr over 60 Minutes Intravenous  Once 04/23/21 2226 04/24/21 0212       Subjective: Denies complaints  Objective: Vitals:   04/25/21 0537 04/25/21 0902 04/25/21 1155 04/25/21 1321  BP: (!) 116/57   (!) 114/56  Pulse: 82   (!) 101  Resp: 20   18   Temp: 99 F (37.2 C)   99.5 F (37.5 C)  TempSrc: Oral   Oral  SpO2: 97% 91% 98% 100%  Weight:      Height:        Intake/Output Summary (Last 24 hours) at 04/25/2021 1946 Last data filed at 04/25/2021 0344 Gross per 24 hour  Intake 1179.69 ml  Output --  Net 1179.69 ml   Filed Weights   04/23/21 2139  Weight: 90.7 kg    Examination:  General: No acute distress. Cardiovascular: RRR Lungs: coarse breath sounds, occasional scattered wheeze Abdomen: Soft, nontender, nondistended  Neurological: Alert and oriented 3. Moves all extremities 4 . Cranial nerves II through XII grossly intact. Skin: Warm and dry. No rashes or lesions. Extremities: No clubbing or cyanosis. No edema.      Data Reviewed: I have personally reviewed following labs and imaging studies  CBC: Recent Labs  Lab 04/22/21 1044 04/23/21 2215 04/24/21 0422 04/25/21 0353  WBC 9.0 3.5* 2.3* 2.2*  NEUTROABS 7.9* 2.3 1.2* 1.5*  HGB 14.4 13.8 12.3* 11.9*  HCT 42.7 42.1 38.4* 35.7*  MCV 93.6 96.6 96.5 96.0  PLT 142* 111* 94* 84*    Basic Metabolic Panel: Recent Labs  Lab 04/22/21 1044 04/23/21 2215 04/24/21 0422 04/25/21 0353  NA 137 133* 135 135  K 4.5 4.3 3.8 4.3  CL 99 98 101 101  CO2 30 27 28 27   GLUCOSE 97 110* 118* 119*  BUN 24* 23 22 15   CREATININE 1.13 1.38* 1.06 1.02  CALCIUM 9.2 8.6* 8.2* 8.3*    GFR: Estimated Creatinine Clearance: 61.3 mL/min (by C-G formula based on SCr of 1.02 mg/dL).  Liver Function Tests: Recent Labs  Lab 04/22/21 1044 04/23/21 2215  AST 18 19  ALT 19 19  ALKPHOS 57 55  BILITOT 1.2 1.0  PROT 7.2 7.2  ALBUMIN 4.1 3.5    CBG: No results for input(s): GLUCAP in the last 168 hours.   Recent Results (from the past 240 hour(s))  Resp Panel by RT-PCR (Flu Toniya Rozar&B, Covid) Nasopharyngeal Swab     Status: None   Collection Time: 04/23/21 10:15 PM   Specimen: Nasopharyngeal Swab; Nasopharyngeal(NP) swabs in vial transport medium  Result Value Ref Range  Status   SARS Coronavirus 2 by RT PCR NEGATIVE NEGATIVE Final    Comment: (NOTE) SARS-CoV-2 target nucleic acids are NOT DETECTED.  The SARS-CoV-2 RNA is generally detectable in upper respiratory specimens during the acute phase of infection. The lowest concentration of SARS-CoV-2 viral copies this assay can detect is 138 copies/mL. Finnlee Guarnieri negative result does not preclude SARS-Cov-2 infection and should not be used as the sole basis for treatment or other patient management decisions. Camilo Mander negative result may occur with  improper specimen collection/handling, submission of specimen other than nasopharyngeal swab, presence of viral mutation(s) within the areas targeted by this assay, and inadequate number of viral copies(<138 copies/mL). Ltanya Bayley negative result  must be combined with clinical observations, patient history, and epidemiological information. The expected result is Negative.  Fact Sheet for Patients:  EntrepreneurPulse.com.au  Fact Sheet for Healthcare Providers:  IncredibleEmployment.be  This test is no t yet approved or cleared by the [place] FDA and  has been authorized for detection and/or diagnosis of SARS-CoV-2 by FDA under an Emergency Use Authorization (EUA). This EUA will remain  in effect (meaning this test can be used) for the duration of the COVID-19 declaration under Section 564(b)(1) of the Act, 21 U.S.C.section 360bbb-3(b)(1), unless the authorization is terminated  or revoked sooner.       Influenza Avaleen Brownley by PCR NEGATIVE NEGATIVE Final   Influenza B by PCR NEGATIVE NEGATIVE Final    Comment: (NOTE) The Xpert Xpress SARS-CoV-2/FLU/RSV plus assay is intended as an aid in the diagnosis of influenza from Nasopharyngeal swab specimens and should not be used as [place] sole basis for treatment. Nasal washings and aspirates are unacceptable for Xpert Xpress SARS-CoV-2/FLU/RSV testing.  Fact Sheet for  Patients: EntrepreneurPulse.com.au  Fact Sheet for Healthcare Providers: IncredibleEmployment.be  This test is not yet approved or cleared by the [place] FDA and has been authorized for detection and/or diagnosis of SARS-CoV-2 by FDA under an Emergency Use Authorization (EUA). This EUA will remain in effect (meaning this test can be used) for the duration of the COVID-19 declaration under Section 564(b)(1) of the Act, 21 U.S.C. section 360bbb-3(b)(1), unless the authorization is terminated or revoked.  Performed at [facility], [place] [address], [place], [place] [address]   Blood culture (routine x 2)     Status: None (Preliminary result)   Collection Time: 04/23/21 10:15 PM   Specimen: BLOOD RIGHT ARM  Result Value Ref Range Status   Specimen Description   Final    BLOOD RIGHT ARM Performed at [facility] [address] [place], [place] [address]    Special Requests   Final    BOTTLES DRAWN AEROBIC AND ANAEROBIC Blood Culture results may not be optimal due to an inadequate volume of blood received in culture bottles Performed at [facility] [address], [place], [place] [address]    Culture   Final    NO GROWTH 1 DAY Performed at [facility], [place] [address] [place], [place] [address]    Report Status PENDING  Incomplete  Blood culture (routine x 2)     Status: None (Preliminary result)   Collection Time: 04/23/21 10:15 PM   Specimen: BLOOD  Result Value Ref Range Status   Specimen Description   Final    BLOOD RIGHT ANTECUBITAL Performed at [facility], [place] [address] [place], [place] [address]    Special Requests   Final    BOTTLES DRAWN AEROBIC AND ANAEROBIC Blood Culture adequate volume Performed at [facility] [address] [place], [place] [address]    Culture   Final    NO GROWTH 1 DAY Performed at [facility], [place] [address], [place], [place] [address]    Report Status PENDING  Incomplete  Urine Culture     Status: None   Collection Time: 04/24/21  3:42 AM   Specimen: In/Out Cath Urine  Result Value Ref Range Status   Specimen Description   Final    IN/OUT CATH URINE Performed at [facility] [address] [place], [place] [address]    Special Requests   Final  NONE Performed at [facility], [place] [address] [place], [place] [address]    Culture   Final    NO GROWTH Performed at [facility], [place] [address] [place], [place] [address]    Report Status 04/24/2021 FINAL  Final         Radiology Studies: DG Chest 2 View  Result Date: 04/23/2021 CLINICAL DATA:  Shortness of breath fever EXAM: CHEST - 2 VIEW COMPARISON:  09/27/2019, CT 02/28/2021, PET CT 02/28/2021 FINDINGS: Volume loss in the right thorax. Fiducial markers in the right mid lung. Small loculated right pleural effusion. Worsening airspace disease in the right lung base. Right paratracheal opacity corresponding to adenopathy. Stable cardiac size. Left lung is clear. Emphysema IMPRESSION: 1. Small loculated right pleural effusion with increased airspace disease in the right lung base raising concern for acute pneumonia superimposed on previously noted areas of consolidation and possible recurrent on prior CT. 2. Emphysema.  Cardiomegaly. Electronically Signed   By: Donavan Foil M.D.   On: 04/23/2021 22:22   CT CHEST WO CONTRAST  Result Date: 04/24/2021 CLINICAL DATA:  81 year old male with history of fever and congestion. Abnormal chest x-ray. EXAM: CT CHEST WITHOUT CONTRAST TECHNIQUE: Multidetector CT imaging of the chest was performed following the standard protocol without IV contrast. RADIATION DOSE REDUCTION: This exam was performed according to the departmental dose-optimization program which includes automated exposure control, adjustment of the mA and/or kV according to  patient size and/or use of iterative reconstruction technique. COMPARISON:  Chest CT 02/28/2021.  PET-CT 03/13/2021. FINDINGS: Cardiovascular: Heart size is normal. There is no significant pericardial fluid, thickening or pericardial calcification. There is aortic atherosclerosis, as well as atherosclerosis of the great vessels of the mediastinum and the coronary arteries, including calcified atherosclerotic plaque in the left main, left anterior descending, left circumflex and right coronary arteries. Calcifications of the aortic valve and mitral annulus. Mediastinum/Nodes: Worsening mediastinal lymphadenopathy, most notable for Cadynce Garrette large right paratracheal nodal mass which measures up to 4.1 x 3.9 cm inferiorly (axial image 53 of series 2), as compared with 3.5 x 3.8 cm on the recent prior study from 02/28/2021. Prominent soft tissue in the right hilar region is also noted on today's examination, but difficult to discern from adjacent vasculature on today's noncontrast CT examination. Right hilar lymphadenopathy is suspected, however. Esophagus is unremarkable in appearance. No axillary lymphadenopathy. Asymmetric heterogeneous enlargement of the right lobe of the thyroid gland which demonstrates internal calcifications, similar to numerous prior studies and not hypermetabolic on remote prior PET-CT 09/12/2019, presumably an asymmetric goiter. Lungs/Pleura: Again noted is Ophelia Sipe mass in the anteromedial aspect of the right upper lobe which appears to invade both the anterior chest wall and the anterior mediastinum (axial image 55 of series 2 and coronal image 62 of series 6), which has enlarged compared to the prior study, currently measuring 5.6 x 3.0 x 3.3 cm (previously 2.4 x 3.9 x 2.3 cm on prior chest CT 02/28/2021). Extensive nodular septal thickening, thickening of the peribronchovascular interstitium and regional architectural distortion is noted throughout the right lung on today's examination, new compared to  the prior study, likely to reflect progressive lymphangitic spread of tumor throughout the right lung. Minimal septal thickening is also noted in the left lung. Small thick-walled right-sided pleural effusion slightly larger than the prior study, likely malignant. No left pleural effusion. Upper Abdomen: Aortic atherosclerosis. Tiny calcified gallstones lying dependently in the gallbladder. Numerous colonic diverticulae are noted in the visualized portions of  the distal transverse colon. Musculoskeletal: There are no aggressive appearing lytic or blastic lesions noted in the visualized portions of the skeleton. IMPRESSION: 1. Today's study demonstrates definitive progression of disease with enlargement of the right upper lobe mass which again appears to invade the right chest wall and anterior mediastinum, worsening right hilar and mediastinal lymphadenopathy, enlarging thick-walled presumably malignant right pleural effusion, and interval development of what appears to be lymphangitic spread of tumor throughout the right lung, as detailed above. 2. Aortic atherosclerosis, in addition to left main and three-vessel coronary artery disease. 3. Cholelithiasis. 4. Colonic diverticulosis. 5. Additional incidental findings, as above. Aortic Atherosclerosis (ICD10-I70.0). Electronically Signed   By: Vinnie Langton M.D.   On: 04/24/2021 07:17        Scheduled Meds:  acitretin  25 mg Oral Daily   aspirin EC  81 mg Oral Daily   erythromycin   Both Eyes QHS   heparin  5,000 Units Subcutaneous Q8H   ipratropium-albuterol  3 mL Nebulization Q6H   nicotine  21 mg Transdermal Daily   omega-3 acid ethyl esters  1 g Oral Daily   polyethylene glycol  17 g Oral BID   polyvinyl alcohol  2 drop Both Eyes QID   rosuvastatin  20 mg Oral QPM   vitamin B-12  500 mcg Oral QPM   Continuous Infusions:  azithromycin 500 mg (04/25/21 0155)   cefTRIAXone (ROCEPHIN)  IV 2 g (04/25/21 0526)     LOS: 0 days    Time  spent: over 30 min    Fayrene Helper, MD [facility]   To contact the attending provider between 7A-7P or the covering provider during after hours 7P-7A, please log into the web site [email or web address] and access using universal [facility] password for that web site. If you do not have the password, please call the hospital operator.  04/25/2021, 7:46 PM

## 2021-04-25 NOTE — Assessment & Plan Note (Addendum)
Fever, leukopenia, and infection Treat as pneumonia as above

## 2021-04-25 NOTE — Progress Notes (Signed)
Overnight Oxygen wean off attempted.Patient's initial O2 sat on 4L >95. O2 FLOW  decreased to 2L, Pt's O2 sat dropped to 85. O2 flow was increased to 4 L to achieve the saturation > 95 percent.

## 2021-04-25 NOTE — Assessment & Plan Note (Addendum)
Continue wound care per wife, follow

## 2021-04-25 NOTE — Hospital Course (Addendum)
Gerald [name] is Gerald [name] 81 y.o. 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  Has been having some runny nose and congestion.  Denies any nausea vomiting diarrhea abdominal pain or chest pain.  CT was concerning for progression of disease with right upper lobe mass enlargement, worsening R hilar and mediastinal LAD, enlarging right pleural effusion, and concern for lymphangitic spread of tumor.  He was treated for presumed pneumonia as well as given steroids for wheezing/COPD exacerbation.  He's gradually improved, but continues to require oxygen at the time of discharge.  Hospitalization complicated by urinary retention requiring foley catheter placement, will follow outpatient for TOV with urology.  Also complicated by liver injury, suspected this is related to chemotherapy or medications, will need to follow outpatient while holding possibly contributing meds.    See below for additional details

## 2021-04-25 NOTE — Assessment & Plan Note (Addendum)
Follows with Dr. Julien Nordmann outpatient CT chest with findings concerning for progression of disease -- he's aware, it's very early in treatment, too early to assess for response, he'll follow with them outpatient Will need to follow up after completion of treatment for infection

## 2021-04-25 NOTE — Assessment & Plan Note (Signed)
improved

## 2021-04-25 NOTE — Assessment & Plan Note (Addendum)
Request thora to eval for presence of infection Follow gram stain (without organism) and culture (NGx2) Follow cytology pending as above

## 2021-04-25 NOTE — Telephone Encounter (Signed)
I spoke with pts wife at length regarding her concerns. She expressed significant angst and concern that his cancer is progressing because she and their children are reading all the imaging reports and lab results in [place] and don't understand them being out of range. She now understands his infection has to be taken care before we can resume cancer tx. I also reiterated that the [facility] does not have oversight of hospital operations and I have confirmed for her that the pt has been seeing doctors while inpt. She thanked me for the call and states she feels better now that we've talked things through. She knows to call us back if/when needed.

## 2021-04-25 NOTE — Assessment & Plan Note (Addendum)
Nightly erythromycin ointment, follow ophtho follow up

## 2021-04-25 NOTE — Assessment & Plan Note (Addendum)
Presented with fever after chemo CT chest 1/25 with progression of disease with enlargement of RUL mass which appears to invade the right chest wall and anterior mediastinum, worsening R hilar and mediastinal LAD, enlarging thick walled, presumably malignant R pleural effusion and interval development of what appears to be lymphangitic spread of tumor throughout R lung IR didn't think chest tube would be indicated S/p thoracentesis today 1/27 Will request thora to eval effusion, r/u infection, discussed with oncology -> exudative, follow gram stain no organisms seen, culture NG, and cytology without malignant cells (limiting inflammation) He's completed Adair Lauderback course of abx Wheezing has improved, will d/c steroids Blood cx ng, urine cx ng

## 2021-04-25 NOTE — Assessment & Plan Note (Signed)
Follow with ophtho outpatient Nightly ointment

## 2021-04-25 NOTE — Progress Notes (Signed)
°   04/25/21 1200  Mobility  Activity Contraindicated/medical hold   RN noted pt is having difficulty breathing and would like to hold on mobility for today.  [place] Specialist Acute Rehab Services Office: [phone]

## 2021-04-25 NOTE — Assessment & Plan Note (Addendum)
Now with exertional O2 requirement as above (2 L) Follow with Dr. Valeta Harms outpatient trelegy Will prescribe albuterol

## 2021-04-25 NOTE — TOC Initial Note (Signed)
Transition of Care [facility]) - Initial/Assessment Note    Patient Details  Name: Gerald [name] MRN: [id number] Date of Birth: [birth year]  Transition of Care [facility]) CM/SW Contact:    Leeroy Cha, RN Phone Number: 04/25/2021, 9:06 AM  Clinical Narrative:                  Transition of Care [facility]) Screening Note   Patient Details  Name: Gerald [name] Date of Birth: May 04, 1940   Transition of Care [facility]) CM/SW Contact:    Leeroy Cha, RN Phone Number: 04/25/2021, 9:06 AM    Transition of Care Department [facility]) has reviewed patient and no TOC needs have been identified at this time. We will continue to monitor patient advancement through interdisciplinary progression rounds. If new patient transition needs arise, please place a TOC consult.    Expected Discharge Plan: Home/Self Care Barriers to Discharge: Continued Medical Work up   Patient Goals and CMS Choice Patient states their goals for this hospitalization and ongoing recovery are:: to go home CMS Medicare.gov Compare Post Acute Care list provided to:: Patient Choice offered to / list presented to : Patient  Expected Discharge Plan and Services Expected Discharge Plan: Home/Self Care   Discharge Planning Services: CM Consult   Living arrangements for the past 2 months: Single Family Home                                      Prior Living Arrangements/Services Living arrangements for the past 2 months: Single Family Home Lives with:: Spouse Patient language and need for interpreter reviewed:: Yes Do you feel safe going back to the place where you live?: Yes            Criminal Activity/Legal Involvement Pertinent to Current Situation/Hospitalization: No - Comment as needed  Activities of Daily Living Home Assistive Devices/Equipment: None ADL Screening (condition at time of admission) Patient's cognitive ability adequate to safely complete daily activities?: No Is the patient deaf or have  difficulty hearing?: No Does the patient have difficulty seeing, even when wearing glasses/contacts?: No Does the patient have difficulty concentrating, remembering, or making decisions?: No Patient able to express need for assistance with ADLs?: No Does the patient have difficulty dressing or bathing?: No Independently performs ADLs?: Yes (appropriate for developmental age) Does the patient have difficulty walking or climbing stairs?: No Weakness of Legs: None Weakness of Arms/Hands: None  Permission Sought/Granted                  Emotional Assessment Appearance:: Appears stated age     Orientation: : Oriented to Self, Oriented to Place, Oriented to  Time, Oriented to Situation Alcohol / Substance Use: Not Applicable Psych Involvement: No (comment)  Admission diagnosis:  Hyponatremia [E87.1] Pleural effusion [J90] CAP (community acquired pneumonia) [J18.9] History of lung cancer [Z85.118] AKI (acute kidney injury) ([place]) [N17.9] Community acquired pneumonia, unspecified laterality [J18.9] Sepsis without acute organ dysfunction, due to unspecified organism [facility]) [A41.9] Patient Active Problem List   Diagnosis Date Noted   CAP (community acquired pneumonia) 04/24/2021   Encounter for antineoplastic chemotherapy 04/22/2021   Encounter for antineoplastic immunotherapy 04/22/2021   Pressure ulcer, stage 2 ([place]) 04/21/2021   Small cell lung cancer ([place]) 04/10/2021   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   Adenopathy 03/29/2021   Endobronchial cancer, right ([place])    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   History of bradycardia 12/06/2020   Coronary artery disease due to calcified coronary lesion 12/06/2020   Pure hypercholesterolemia 12/06/2020   Achilles tendon pain 10/08/2020   Psoriasis 09/18/2020   Ischemic cardiomyopathy    Centrilobular emphysema ([place]) 10/26/2019   Tobacco abuse 10/26/2019   Goals of care,  counseling/discussion 10/15/2019   Primary cancer of right upper lobe of lung ([place]) 10/12/2019   Primary cancer of right lower lobe of lung ([place]) 10/12/2019   Lung nodule 09/27/2019   Chronic respiratory insufficiency 07/21/2016   PCP:  [name], DO Pharmacy:   [facility] [place] [address] [place] [address] Phone: [phone] Fax: [phone]     Social Determinants of Health (SDOH) Interventions    Readmission Risk Interventions No flowsheet data found.

## 2021-04-26 ENCOUNTER — Inpatient Hospital Stay (HOSPITAL_COMMUNITY): Payer: Medicare Other

## 2021-04-26 ENCOUNTER — Ambulatory Visit (HOSPITAL_COMMUNITY): Payer: Medicare Other

## 2021-04-26 ENCOUNTER — Other Ambulatory Visit (HOSPITAL_COMMUNITY): Payer: Medicare Other

## 2021-04-26 DIAGNOSIS — J189 Pneumonia, unspecified organism: Secondary | ICD-10-CM | POA: Diagnosis not present

## 2021-04-26 DIAGNOSIS — R338 Other retention of urine: Secondary | ICD-10-CM

## 2021-04-26 LAB — COMPREHENSIVE METABOLIC PANEL
ALT: 53 U/L — ABNORMAL HIGH (ref 0–44)
AST: 46 U/L — ABNORMAL HIGH (ref 15–41)
Albumin: 3 g/dL — ABNORMAL LOW (ref 3.5–5.0)
Alkaline Phosphatase: 73 U/L (ref 38–126)
Anion gap: 9 (ref 5–15)
BUN: 16 mg/dL (ref 8–23)
CO2: 28 mmol/L (ref 22–32)
Calcium: 8.6 mg/dL — ABNORMAL LOW (ref 8.9–10.3)
Chloride: 100 mmol/L (ref 98–111)
Creatinine, Ser: 0.91 mg/dL (ref 0.61–1.24)
GFR, Estimated: >60 mL/min (ref >60)
Glucose, Bld: 113 mg/dL — ABNORMAL HIGH (ref 70–99)
Potassium: 4.2 mmol/L (ref 3.5–5.1)
Sodium: 137 mmol/L (ref 135–145)
Total Bilirubin: 0.7 mg/dL (ref 0.3–1.2)
Total Protein: 6.6 g/dL (ref 6.5–8.1)

## 2021-04-26 LAB — GLUCOSE, PLEURAL OR PERITONEAL FLUID: Glucose, Fluid: 80 mg/dL

## 2021-04-26 LAB — CBC WITH DIFFERENTIAL/PLATELET
Abs Immature Granulocytes: 0.03 10*3/uL (ref 0.00–0.07)
Basophils Absolute: 0 10*3/uL (ref 0.0–0.1)
Basophils Relative: 1 %
Eosinophils Absolute: 0.1 10*3/uL (ref 0.0–0.5)
Eosinophils Relative: 3 %
HCT: 40.2 % (ref 39.0–52.0)
Hemoglobin: 12.8 g/dL — ABNORMAL LOW (ref 13.0–17.0)
Immature Granulocytes: 1 %
Lymphocytes Relative: 19 %
Lymphs Abs: 0.5 10*3/uL — ABNORMAL LOW (ref 0.7–4.0)
MCH: 31.4 pg (ref 26.0–34.0)
MCHC: 31.8 g/dL (ref 30.0–36.0)
MCV: 98.8 fL (ref 80.0–100.0)
Monocytes Absolute: 0.4 10*3/uL (ref 0.1–1.0)
Monocytes Relative: 16 %
Neutro Abs: 1.5 10*3/uL — ABNORMAL LOW (ref 1.7–7.7)
Neutrophils Relative %: 60 %
Platelets: 70 10*3/uL — ABNORMAL LOW (ref 150–400)
RBC: 4.07 MIL/uL — ABNORMAL LOW (ref 4.22–5.81)
RDW: 13.5 % (ref 11.5–15.5)
WBC: 2.5 10*3/uL — ABNORMAL LOW (ref 4.0–10.5)
nRBC: 0 % (ref 0.0–0.2)

## 2021-04-26 LAB — LACTATE DEHYDROGENASE, PLEURAL OR PERITONEAL FLUID: LD, Fluid: 202 U/L — ABNORMAL HIGH (ref 3–23)

## 2021-04-26 LAB — PHOSPHORUS: Phosphorus: 3.3 mg/dL (ref 2.5–4.6)

## 2021-04-26 LAB — BODY FLUID CELL COUNT WITH DIFFERENTIAL
Eos, Fluid: 0 %
Lymphs, Fluid: 83 %
Monocyte-Macrophage-Serous Fluid: 10 % — ABNORMAL LOW (ref 50–90)
Neutrophil Count, Fluid: 7 % (ref 0–25)
Total Nucleated Cell Count, Fluid: 1059 cu mm — ABNORMAL HIGH (ref 0–1000)

## 2021-04-26 LAB — LACTATE DEHYDROGENASE: LDH: 122 U/L (ref 98–192)

## 2021-04-26 LAB — MAGNESIUM: Magnesium: 2.1 mg/dL (ref 1.7–2.4)

## 2021-04-26 MED ORDER — LIDOCAINE HCL 1 % IJ SOLN
INTRAMUSCULAR | Status: AC
  Filled 2021-04-26: qty 20

## 2021-04-26 MED ORDER — AZITHROMYCIN 250 MG PO TABS: 500.0000 mg | ORAL_TABLET | Freq: Every day | ORAL | Status: DC

## 2021-04-26 MED ORDER — ORAL CARE MOUTH RINSE
15.0000 mL | Freq: Two times a day (BID) | OROMUCOSAL | Status: DC
  Administered 2021-04-26 – 2021-04-30 (×8): 15 mL via OROMUCOSAL

## 2021-04-26 MED ORDER — PREDNISONE 20 MG PO TABS
40.0000 mg | ORAL_TABLET | Freq: Every day | ORAL | Status: DC
  Administered 2021-04-27 – 2021-04-30 (×4): 40 mg via ORAL
  Filled 2021-04-26 (×4): qty 2

## 2021-04-26 NOTE — Assessment & Plan Note (Addendum)
Now with indwelling foley Will need to discharge with this and follow with urology outpatient - [facility] will call for appt flom[facility]

## 2021-04-26 NOTE — Progress Notes (Signed)
Pt has had increased urinary frequency all night. He's able to void but is not completely emptying his bladder. Bladder scan done to make sure pt was retaining a large amount of urine. Bladder scan shows >417 ml. Provider paged to get order for in and out cath to help empty pt's bladder. See new orders.

## 2021-04-26 NOTE — Progress Notes (Signed)
Please see in addition to PT note:   SATURATION QUALIFICATIONS: (This note is used to comply with regulatory documentation for home oxygen)   Patient Saturations on Room Air at Rest = 93%    Patient Saturations on Room Air while Ambulating = 87%    Patient Saturations on 2 Liters of oxygen while Ambulating = 95%    Please briefly explain why patient needs home oxygen: SPO2 desat with activity on RA, unable to follow cues to effectively PLB to recover sats on RA   Ann Lions PT, DPT, PN2   Supplemental Physical Therapist [place]    Pager [phone] Acute Rehab Office [phone]

## 2021-04-26 NOTE — Plan of Care (Addendum)
Patient provided with IS; coached on use. Return demonstration by patient observed.   Problem: Education: Goal: Knowledge of General Education information will improve Description: Including pain rating scale, medication(s)/side effects and non-pharmacologic comfort measures Outcome: Progressing   Problem: Health Behavior/Discharge Planning: Goal: Ability to manage health-related needs will improve Outcome: Progressing   Problem: Clinical Measurements: Goal: Ability to maintain clinical measurements within normal limits will improve Outcome: Progressing Goal: Will remain free from infection Outcome: Progressing Goal: Diagnostic test results will improve Outcome: Progressing Goal: Respiratory complications will improve Outcome: Progressing Goal: Cardiovascular complication will be avoided Outcome: Progressing   Problem: Activity: Goal: Risk for activity intolerance will decrease Outcome: Progressing   Problem: Nutrition: Goal: Adequate nutrition will be maintained Outcome: Progressing   Problem: Coping: Goal: Level of anxiety will decrease Outcome: Progressing   Problem: Elimination: Goal: Will not experience complications related to bowel motility Outcome: Progressing Goal: Will not experience complications related to urinary retention Outcome: Progressing   Problem: Pain Managment: Goal: General experience of comfort will improve Outcome: Progressing   Problem: Safety: Goal: Ability to remain free from injury will improve Outcome: Progressing   Problem: Skin Integrity: Goal: Risk for impaired skin integrity will decrease Outcome: Progressing

## 2021-04-26 NOTE — Progress Notes (Addendum)
Wife kept repeatedly asking this nurse to walk patient.  This nurse explained multiple times that patient got very short of breath and begun wheezing just transferring from bed to chair.  Wife was still asking staff if patient could walk around the room.  Oncoming nurse and nurse tech made aware.  Virginia Rochester, RN

## 2021-04-26 NOTE — Evaluation (Signed)
Physical Therapy Evaluation Patient Details Name: Gerald [name] MRN: [id number] DOB: [birth year] Today's Date: 04/26/2021  History of Present Illness  81yo male who presented on 1/24 with fever, runny  nose, congestion. Covid negative. CT concerning for progression of known cancer. Received thoracentesis on 1/27. Admitted with PNA concerning for sepsis. PMH lung CA on chemo, bradycardia, CAD, COPD, HLD, cardiomyopathy  Clinical Impression   Received in recliner, pleasant and cooperative. Spouse present and assisted with line management during session. Able to generally mobilize with min guard and no device, did seem a little confused and needed lots of cues for simple tasks and general safety, unable to follow cues for PLB well- not sure what baseline is. Did need 2LPM to maintain >95% with activity, desatted to 87% with short bout of gait in room on room air. Spent a lot of time answering general questions about PT and reccs as well as importance of mobility safely and when medically ready. Left up in recliner with all needs met, chair alarm active and spouse present. Will benefit from [place], DME as below, and 24/7S at DC, which spouse can provide.      Recommendations for follow up therapy are one component of a multi-disciplinary discharge planning process, led by the attending physician.  Recommendations may be updated based on patient status, additional functional criteria and insurance authorization.  Follow Up Recommendations Home health PT    Assistance Recommended at Discharge Frequent or constant Supervision/Assistance  Patient can return home with the following  A little help with walking and/or transfers;Direct supervision/assist for medications management;Direct supervision/assist for financial management;Assistance with cooking/housework;Assist for transportation;A little help with bathing/dressing/bathroom    Equipment Recommendations Rollator (4 wheels);BSC/3in1;Other (comment)  (shower bench)  Recommendations for Other Services       Functional Status Assessment Patient has had a recent decline in their functional status and demonstrates the ability to make significant improvements in function in a reasonable and predictable amount of time.     Precautions / Restrictions Precautions Precautions: Fall;Other (comment) Precaution Comments: watch sats, on active chemo Restrictions Weight Bearing Restrictions: No      Mobility  Bed Mobility               General bed mobility comments: up in recliner    Transfers Overall transfer level: Needs assistance Equipment used: None Transfers: Sit to/from Stand Sit to Stand: Min guard                Ambulation/Gait Ambulation/Gait assistance: Min guard Gait Distance (Feet): 80 Feet (65f, then 60 additional feet; laps in room as we were not sure he would tolerate mask well) Assistive device: None Gait Pattern/deviations: Step-to pattern, Narrow base of support Gait velocity: decreased     General Gait Details: slow but steady on feet  Stairs            Wheelchair Mobility    Modified Rankin (Stroke Patients Only)       Balance Overall balance assessment: Mild deficits observed, not formally tested                                           Pertinent Vitals/Pain Pain Assessment Pain Assessment: No/denies pain Pain Score: 0-No pain Pain Intervention(s): Limited activity within patient's tolerance, Monitored during session    Home Living Family/patient expects to be discharged to:: Private residence Living Arrangements: Spouse/significant other Available  Help at Discharge: Family;Available 24 hours/day (wife cannot help him physically, daughter is local) Type of Home: Other(Comment) (townhouse) Home Access: Level entry       Home Layout: Two level;Able to live on main level with bedroom/bathroom Home Equipment: Rollator (4 wheels) Additional Comments:  rollator belongs to his wife but she does not use it anymore so it is available; not usually on O2    Prior Function Prior Level of Function : Independent/Modified Independent             Mobility Comments: independent; still drive; wife handles money and meds ADLs Comments: usually independent     Hand Dominance        Extremity/Trunk Assessment   Upper Extremity Assessment Upper Extremity Assessment: Generalized weakness    Lower Extremity Assessment Lower Extremity Assessment: Generalized weakness    Cervical / Trunk Assessment Cervical / Trunk Assessment: Normal  Communication   Communication: No difficulties  Cognition Arousal/Alertness: Awake/alert Behavior During Therapy: WFL for tasks assessed/performed, Flat affect Overall Cognitive Status: Impaired/Different from baseline Area of Impairment: Orientation, Memory, Following commands, Safety/judgement, Awareness, Problem solving                 Orientation Level: Disoriented to, Place, Time (tells me he is at "[place]", then that it is "january or february" needed prompts to get more specific)   Memory: Decreased short-term memory Following Commands: Follows one step commands inconsistently, Follows one step commands with increased time Safety/Judgement: Decreased awareness of safety, Decreased awareness of deficits Awareness: Intellectual Problem Solving: Slow processing, Difficulty sequencing, Requires verbal cues, Decreased initiation General Comments: seemed slightly confused today, I'm not sure what his baseline is- was surprised that leg rest on chair came up at EOS even tho he was reclined with legs up when I entered room, needed lots of simple cues for simpole tasks such as holding onto lines during gait, slow to initiate without cues        General Comments General comments (skin integrity, edema, etc.): see SPO2 sat note    Exercises     Assessment/Plan    PT Assessment Patient needs  continued PT services  PT Problem List Decreased strength;Decreased knowledge of use of DME;Decreased activity tolerance;Decreased safety awareness;Decreased balance;Decreased mobility;Cardiopulmonary status limiting activity       PT Treatment Interventions DME instruction;Balance training;Gait training;Neuromuscular re-education;Cognitive remediation;Functional mobility training;Patient/family education;Therapeutic activities;Therapeutic exercise    PT Goals (Current goals can be found in the Care Plan section)  Acute Rehab PT Goals Patient Stated Goal: go home when medically ready, walk more PT Goal Formulation: With patient/family Time For Goal Achievement: 05/10/21 Potential to Achieve Goals: Good    Frequency Min 3X/week     Co-evaluation               AM-PAC PT "6 Clicks" Mobility  Outcome Measure Help needed turning from your back to your side while in a flat bed without using bedrails?: None Help needed moving from lying on your back to sitting on the side of a flat bed without using bedrails?: None Help needed moving to and from a bed to a chair (including a wheelchair)?: A Little Help needed standing up from a chair using your arms (e.g., wheelchair or bedside chair)?: A Little Help needed to walk in hospital room?: A Little Help needed climbing 3-5 steps with a railing? : A Lot 6 Click Score: 19    End of Session Equipment Utilized During Treatment: Gait belt;Oxygen Activity Tolerance: Patient tolerated treatment  well Patient left: in chair;with call bell/phone within reach;with chair alarm set;with family/visitor present Nurse Communication: Mobility status;Other (comment) (SPO2 during session) PT Visit Diagnosis: Muscle weakness (generalized) (M62.81);Difficulty in walking, not elsewhere classified (R26.2)    Time: 1325-1400 PT Time Calculation (min) (ACUTE ONLY): 35 min   Charges:   PT Evaluation $PT Eval Moderate Complexity: 1 Mod PT Treatments $Gait  Training: 8-22 mins       Elven Laboy U PT, DPT, PN2   Supplemental Physical Therapist [place]

## 2021-04-26 NOTE — Procedures (Signed)
Ultrasound-guided diagnostic and therapeutic right thoracentesis performed yielding 110 cc of yellow fluid. No immediate complications. Follow-up chest x-ray pending.The fluid was sent to the lab for preordered studies. The amount of free pleural fluid on right was very small by [place] today. Despite cath manipulation only the above amount of fluid could be aspirated . EBL none.

## 2021-04-26 NOTE — Progress Notes (Addendum)
PROGRESS NOTE    Gerald [name]  TIR:[id number] DOB: 08-Sep-1940 DOA: 04/23/2021 PCP: [name], DO  Chief Complaint  Patient presents with   Fever    Brief Narrative:  Gerald [name] is Gerald [name] 81 y.o. 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  Has been having some runny nose and congestion.  Denies any nausea vomiting diarrhea abdominal pain or chest pain.   ED Course: In the ER patient was febrile with temperature of 102.1 F tachycardic with chest x-ray showing loculated right effusion with concerning features for acute pneumonia.  COVID test was negative.  High blood cultures drawn and started on empiric antibiotics.  Lab work also showed leukopenia but neutrophil count was more than 500.    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)   Centrilobular emphysema (HCC)   Pressure ulcer, stage 2 (HCC)   AKI (acute kidney injury) ([place])   Acute urinary retention   Ectropion   Blepharitis   * CAP (community acquired pneumonia)- (present on admission) Presented with fever after chemo CT chest 1/25 with progression of disease with enlargement of RUL mass which appears to invade the right chest wall and anterior mediastinum, worsening R hilar and mediastinal LAD, enlarging thick walled, presumably malignant R pleural effusion and interval development of what appears to be lymphangitic spread of tumor throughout R lung IR didn't think chest tube would be indicated S/p thoracentesis today 1/27 Will request thora to eval effusion, r/u infection, discussed with oncology -> exudative, follow gram stain, culture, and cytology Continue ceftriaxone and azithromycin for now Will add oral steroids with wheezing on exam Blood cx ng, urine cx ng   Sepsis ([place]) Fever,  leukopenia, and infection Treat as above  Exudative pleural effusion Request thora to eval for presence of infection Follow gram stain and culture  Follow cytology  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 Julien Nordmann outpatient CT chest with findings concerning for progression of disease Will need to follow up after completion of treatment for infection  Centrilobular emphysema ([place])- (present on admission) Continue nebs Will add steroids, some wheezing noted  Pressure ulcer, stage 2 ([place])- (present on admission) Wound c/s  Acute urinary retention S/p I&O cath Follow, seems to be resolved at this point   AKI (acute kidney injury) ([place]) improved  Blepharitis Nightly erythromycin ointment, follow ophtho follow up  Ectropion Follow with ophtho outpatient Nightly ointment   DVT prophylaxis: heparin -> SCD while awaiting IR eval Code Status: full Family Communication: wife, daughter Disposition:   Status is: Inpatient  Remains inpatient appropriate because: need for IR procedure       Consultants:  IR  Procedures:  none  Antimicrobials:  Anti-infectives (From admission, onward)    Start     Dose/Rate Route Frequency Ordered Stop   04/26/21 2200  azithromycin (ZITHROMAX) tablet 500 mg  Status:  Discontinued        500 mg Oral Daily at bedtime 04/26/21 0835 04/26/21 0838   04/24/21 0600  cefTRIAXone (ROCEPHIN) 2 g in sodium chloride 0.9 % 100 mL IVPB        2 g 200 mL/hr over 30 Minutes Intravenous Every 24 hours 04/24/21 0156 04/29/21 0559   04/24/21 0200  azithromycin (ZITHROMAX) 500 mg in sodium chloride 0.9 % 250 mL IVPB        500 mg 250 mL/hr over 60 Minutes Intravenous Every 24 hours 04/24/21 0156 04/29/21 0159   04/23/21 2230  ceFEPIme (MAXIPIME) 2 g in sodium chloride 0.9 % 100 mL IVPB        2 g 200 mL/hr over 30 Minutes Intravenous  Once 04/23/21 2226 04/23/21 2336   04/23/21 2230   metroNIDAZOLE (FLAGYL) IVPB 500 mg        500 mg 100 mL/hr over 60 Minutes Intravenous  Once 04/23/21 2226 04/24/21 0050   04/23/21 2230  vancomycin (VANCOCIN) IVPB 1000 mg/200 mL premix        1,000 mg 200 mL/hr over 60 Minutes Intravenous  Once 04/23/21 2226 04/24/21 0212       Subjective: No new complaints  Objective: Vitals:   04/26/21 1017 04/26/21 1047 04/26/21 1403 04/26/21 1414  BP: 102/60 111/67  (!) 104/55  Pulse:  88  85  Resp:  18  (!) 24  Temp:  98.5 F (36.9 C)  98.6 F (37 C)  TempSrc:  Oral  Oral  SpO2:  100% 99% 100%  Weight:      Height:        Intake/Output Summary (Last 24 hours) at 04/26/2021 1832 Last data filed at 04/26/2021 0998 Gross per 24 hour  Intake 470.01 ml  Output 1150 ml  Net -679.99 ml   Filed Weights   04/23/21 2139  Weight: 90.7 kg    Examination:  General: No acute distress. Cardiovascular: RRR Lungs: coarse, scattered wheezing Abdomen: Soft, nontender, nondistended Neurological: Alert and oriented 3. Moves all extremities 4 . Cranial nerves II through XII grossly intact. Skin: Warm and dry. No rashes or lesions. Extremities: No clubbing or cyanosis. No edema.     Data Reviewed: I have personally reviewed following labs and imaging studies  CBC: Recent Labs  Lab 04/22/21 1044 04/23/21 2215 04/24/21 0422 04/25/21 0353 04/26/21 0427  WBC 9.0 3.5* 2.3* 2.2* 2.5*  NEUTROABS 7.9* 2.3 1.2* 1.5* 1.5*  HGB 14.4 13.8 12.3* 11.9* 12.8*  HCT 42.7 42.1 38.4* 35.7* 40.2  MCV 93.6 96.6 96.5 96.0 98.8  PLT 142* 111* 94* 84* 70*    Basic Metabolic Panel: Recent Labs  Lab 04/22/21 1044 04/23/21 2215 04/24/21 0422 04/25/21 0353 04/26/21 0427  NA 137 133* 135 135 137  K 4.5 4.3 3.8 4.3 4.2  CL 99 98 101 101 100  CO2 30 27 28 27 28   GLUCOSE 97 110* 118* 119* 113*  BUN 24* 23 22 15 16   CREATININE 1.13 1.38* 1.06 1.02 0.91  CALCIUM 9.2 8.6* 8.2* 8.3* 8.6*  MG  --   --   --   --  2.1  PHOS  --   --   --   --  3.3     GFR: Estimated Creatinine Clearance: 68.7 mL/min (by C-G formula based on SCr of 0.91 mg/dL).  Liver Function Tests: Recent Labs  Lab 04/22/21 1044 04/23/21 2215 04/26/21 0427  AST 18 19 46*  ALT 19 19 53*  ALKPHOS 57 55 73  BILITOT 1.2 1.0 0.7  PROT 7.2 7.2 6.6  ALBUMIN 4.1 3.5 3.0*    CBG: No results for input(s): GLUCAP in the last 168 hours.   Recent Results (from the past 240 hour(s))  Resp Panel by RT-PCR (Flu Hallie Ishida&B, Covid) Nasopharyngeal Swab     Status: None   Collection Time: 04/23/21 10:15 PM  Specimen: Nasopharyngeal Swab; Nasopharyngeal(NP) swabs in vial transport medium  Result Value Ref Range Status   SARS Coronavirus 2 by RT PCR NEGATIVE NEGATIVE Final    Comment: (NOTE) SARS-CoV-2 target nucleic acids are NOT DETECTED.  The SARS-CoV-2 RNA is generally detectable in upper respiratory specimens during the acute phase of infection. The lowest concentration of SARS-CoV-2 viral copies this assay can detect is 138 copies/mL. Gerald [name] negative result does not preclude SARS-Cov-2 infection and should not be used as the sole basis for treatment or other patient management decisions. Gerald [name] negative result may occur with  improper specimen collection/handling, submission of specimen other than nasopharyngeal swab, presence of viral mutation(s) within the areas targeted by this assay, and inadequate number of viral copies(<138 copies/mL). Gerald [name] negative result must be combined with clinical observations, patient history, and epidemiological information. The expected result is Negative.  Fact Sheet for Patients:  EntrepreneurPulse.com.au  Fact Sheet for Healthcare Providers:  IncredibleEmployment.be  This test is no t yet approved or cleared by the [place] FDA and  has been authorized for detection and/or diagnosis of SARS-CoV-2 by FDA under an Emergency Use Authorization (EUA). This EUA will remain  in effect (meaning this test  can be used) for the duration of the COVID-19 declaration under Section 564(b)(1) of the Act, 21 U.S.C.section 360bbb-3(b)(1), unless the authorization is terminated  or revoked sooner.       Influenza Gerald [name] by PCR NEGATIVE NEGATIVE Final   Influenza B by PCR NEGATIVE NEGATIVE Final    Comment: (NOTE) The Xpert Xpress SARS-CoV-2/FLU/RSV plus assay is intended as an aid in the diagnosis of influenza from Nasopharyngeal swab specimens and should not be used as Gerald [name] sole basis for treatment. Nasal washings and aspirates are unacceptable for Xpert Xpress SARS-CoV-2/FLU/RSV testing.  Fact Sheet for Patients: EntrepreneurPulse.com.au  Fact Sheet for Healthcare Providers: IncredibleEmployment.be  This test is not yet approved or cleared by the [place] FDA and has been authorized for detection and/or diagnosis of SARS-CoV-2 by FDA under an Emergency Use Authorization (EUA). This EUA will remain in effect (meaning this test can be used) for the duration of the COVID-19 declaration under Section 564(b)(1) of the Act, 21 U.S.C. section 360bbb-3(b)(1), unless the authorization is terminated or revoked.  Performed at [facility], [place] [address] [place], [place] [address]   Blood culture (routine x 2)     Status: None (Preliminary result)   Collection Time: 04/23/21 10:15 PM   Specimen: BLOOD RIGHT ARM  Result Value Ref Range Status   Specimen Description   Final    BLOOD RIGHT ARM Performed at [facility] [address], [place], [place] [address]    Special Requests   Final    BOTTLES DRAWN AEROBIC AND ANAEROBIC Blood Culture results may not be optimal due to an inadequate volume of blood received in culture bottles Performed at [facility] [address], [place], [place] [address]    Culture   Final    NO GROWTH 2 DAYS Performed at [facility] [address] [place],  [place] [address]    Report Status PENDING  Incomplete  Blood culture (routine x 2)     Status: None (Preliminary result)   Collection Time: 04/23/21 10:15 PM   Specimen: BLOOD  Result Value Ref Range Status   Specimen Description   Final    BLOOD RIGHT ANTECUBITAL Performed at [facility], [place] [address], [place], [place] [address]  Special Requests   Final    BOTTLES DRAWN AEROBIC AND ANAEROBIC Blood Culture adequate volume Performed at [facility] [address] [place], [place] [address]    Culture   Final    NO GROWTH 2 DAYS Performed at [facility] [address] [place], [place] [address]    Report Status PENDING  Incomplete  Urine Culture     Status: None   Collection Time: 04/24/21  3:42 AM   Specimen: In/Out Cath Urine  Result Value Ref Range Status   Specimen Description   Final    IN/OUT CATH URINE Performed at [facility] [address] [place], [place] [address]    Special Requests   Final    NONE Performed at [facility], [place] [address] [place], [place] [address]    Culture   Final    NO GROWTH Performed at [facility], [place] [address] [place], [place] [address]    Report Status 04/24/2021 FINAL  Final         Radiology Studies: DG Chest 1 View  Result Date: 04/26/2021 CLINICAL DATA:  Post right thoracentesis EXAM: CHEST  1 VIEW COMPARISON:  04/23/2021 FINDINGS: No pneumothorax. Persistent right pleural effusion. Right lung opacities and interstitial thickening as before. Lung aeration at the right lung base has decreased. Similar cardiomediastinal contours with volume loss in the right chest resulting in mediastinal shift. IMPRESSION: No pneumothorax. Persistent right pleural effusion. Persistent right pulmonary opacities and interstitial thickening with decreased aeration at the right lung base. Electronically Signed   By: Macy Mis M.D.   On: 04/26/2021 10:35   US  THORACENTESIS ASP PLEURAL SPACE W/IMG GUIDE  Result Date: 04/26/2021 INDICATION: Patient with history of small cell lung cancer, pneumonia, COPD, right pleural effusion. Request received for diagnostic and therapeutic right thoracentesis. EXAM: ULTRASOUND GUIDED DIAGNOSTIC AND THERAPEUTIC RIGHT THORACENTESIS MEDICATIONS: 10 mL 1% lidocaine COMPLICATIONS: None immediate. PROCEDURE: An ultrasound guided thoracentesis was thoroughly discussed with the patient and questions answered. The benefits, risks, alternatives and complications were also discussed. The patient understands and wishes to proceed with the procedure. Written consent was obtained. Ultrasound was performed to localize and mark an adequate pocket of fluid in the right chest. The area was then prepped and draped in the normal sterile fashion. 1% Lidocaine was used for local anesthesia. Under ultrasound guidance Gerald [name] 6 Fr Safe-T-Centesis catheter was introduced. Thoracentesis was performed. The catheter was removed and Gerald [name] dressing applied. FINDINGS: Gerald [name] total of approximately 110 cc of yellow fluid was removed. Samples were sent to the laboratory as requested by the clinical team. Only Gerald [name] small amount of free pleural fluid was noted on today's ultrasound. Despite catheter manipulation only the above amount of fluid could be aspirated today. IMPRESSION: Successful ultrasound guided diagnostic and therapeutic right thoracentesis yielding 110 cc of pleural fluid. Read by: Rowe Robert, PA-C Electronically Signed   By: Jerilynn Mages.  Shick M.D.   On: 04/26/2021 13:01        Scheduled Meds:  acitretin  25 mg Oral Daily   aspirin EC  81 mg Oral Daily   erythromycin   Both Eyes QHS   ipratropium-albuterol  3 mL Nebulization Q6H   nicotine  21 mg Transdermal Daily   omega-3 acid ethyl esters  1 g Oral Daily   polyethylene glycol  17 g Oral BID   polyvinyl alcohol  2 drop Both Eyes QID   [START ON 04/27/2021] predniSONE  40 mg Oral Q breakfast   rosuvastatin  20 mg  Oral QPM   vitamin B-12  500 mcg Oral QPM   Continuous Infusions:  azithromycin Stopped (04/26/21 0244)   cefTRIAXone (ROCEPHIN)  IV Stopped (04/26/21 0442)     LOS: 1 day    Time spent: over 30 min    Fayrene Helper, MD [facility]   To contact the attending provider between 7A-7P or the covering provider during after hours 7P-7A, please log into the web site [email or web address] and access using universal [place] password for that web site. If you do not have the password, please call the hospital operator.  04/26/2021, 6:32 PM

## 2021-04-27 ENCOUNTER — Inpatient Hospital Stay (HOSPITAL_COMMUNITY): Payer: Medicare Other

## 2021-04-27 DIAGNOSIS — J189 Pneumonia, unspecified organism: Secondary | ICD-10-CM | POA: Diagnosis not present

## 2021-04-27 DIAGNOSIS — J9600 Acute respiratory failure, unspecified whether with hypoxia or hypercapnia: Secondary | ICD-10-CM

## 2021-04-27 DIAGNOSIS — R945 Abnormal results of liver function studies: Secondary | ICD-10-CM

## 2021-04-27 LAB — COMPREHENSIVE METABOLIC PANEL
ALT: 110 U/L — ABNORMAL HIGH (ref 0–44)
AST: 106 U/L — ABNORMAL HIGH (ref 15–41)
Albumin: 2.8 g/dL — ABNORMAL LOW (ref 3.5–5.0)
Alkaline Phosphatase: 109 U/L (ref 38–126)
Anion gap: 8 (ref 5–15)
BUN: 19 mg/dL (ref 8–23)
CO2: 27 mmol/L (ref 22–32)
Calcium: 8.7 mg/dL — ABNORMAL LOW (ref 8.9–10.3)
Chloride: 102 mmol/L (ref 98–111)
Creatinine, Ser: 0.89 mg/dL (ref 0.61–1.24)
GFR, Estimated: >60 mL/min (ref >60)
Glucose, Bld: 132 mg/dL — ABNORMAL HIGH (ref 70–99)
Potassium: 4.6 mmol/L (ref 3.5–5.1)
Sodium: 137 mmol/L (ref 135–145)
Total Bilirubin: 0.5 mg/dL (ref 0.3–1.2)
Total Protein: 6.1 g/dL — ABNORMAL LOW (ref 6.5–8.1)

## 2021-04-27 LAB — CBC WITH DIFFERENTIAL/PLATELET
Abs Immature Granulocytes: 0.01 10*3/uL (ref 0.00–0.07)
Basophils Absolute: 0 10*3/uL (ref 0.0–0.1)
Basophils Relative: 1 %
Eosinophils Absolute: 0.1 10*3/uL (ref 0.0–0.5)
Eosinophils Relative: 3 %
HCT: 37.8 % — ABNORMAL LOW (ref 39.0–52.0)
Hemoglobin: 12 g/dL — ABNORMAL LOW (ref 13.0–17.0)
Immature Granulocytes: 1 %
Lymphocytes Relative: 23 %
Lymphs Abs: 0.5 10*3/uL — ABNORMAL LOW (ref 0.7–4.0)
MCH: 31.2 pg (ref 26.0–34.0)
MCHC: 31.7 g/dL (ref 30.0–36.0)
MCV: 98.2 fL (ref 80.0–100.0)
Monocytes Absolute: 0.4 10*3/uL (ref 0.1–1.0)
Monocytes Relative: 18 %
Neutro Abs: 1.2 10*3/uL — ABNORMAL LOW (ref 1.7–7.7)
Neutrophils Relative %: 54 %
Platelets: 75 10*3/uL — ABNORMAL LOW (ref 150–400)
RBC: 3.85 MIL/uL — ABNORMAL LOW (ref 4.22–5.81)
RDW: 13.4 % (ref 11.5–15.5)
WBC: 2.1 10*3/uL — ABNORMAL LOW (ref 4.0–10.5)
nRBC: 0 % (ref 0.0–0.2)

## 2021-04-27 LAB — PHOSPHORUS: Phosphorus: 2.9 mg/dL (ref 2.5–4.6)

## 2021-04-27 LAB — BRAIN NATRIURETIC PEPTIDE: B Natriuretic Peptide: 99.3 pg/mL (ref 0.0–100.0)

## 2021-04-27 LAB — MAGNESIUM: Magnesium: 1.9 mg/dL (ref 1.7–2.4)

## 2021-04-27 MED ORDER — POLYETHYLENE GLYCOL 3350 17 G PO PACK: 17.0000 g | PACK | Freq: Every day | ORAL | Status: DC | PRN

## 2021-04-27 MED ORDER — SODIUM CHLORIDE 0.9 % IV SOLN: INTRAVENOUS | Status: DC | PRN

## 2021-04-27 NOTE — Progress Notes (Signed)
PHYSICAL THERAPY  SATURATION QUALIFICATIONS: (This note is used to comply with regulatory documentation for home oxygen)  Patient Saturations on Room Air at Rest = 89%  Patient Saturations on Room Air while Ambulating 12 feet = 81% (quick drop)  Patient Saturations on 4 Liters of oxygen while Ambulating  350 feet =90% HR 97  Please briefly explain why patient needs home oxygen:  Pt requires supplemental oxygen to achieve therapeutic levels.  Also educated on use of incentive spirometer.   Rica Koyanagi  PTA Acute  Rehabilitation Services Pager      [phone] Office      [phone]

## 2021-04-27 NOTE — Progress Notes (Signed)
Pt transferred to room 1503 from [address] [place]. Arrived in w/c, ambulated to chair w/ SBA. Pt oriented to callbell and environment. Wife bedside. Pt w/ no c/o at present. Callbell in reach. VSS.

## 2021-04-27 NOTE — Assessment & Plan Note (Addendum)
Elevated LFT's today - fluctuating Normal bili/alk phos.  AST improving, ALT rising on day of discharge  Follow acute hepatitis panel negative Will hold acitretin, fish oil, and crestor - consider resumption in future with improvement in LFT's ? Related to meds or chemotherapy possible, discussed with Dr. Julien Nordmann - at this point, plan to hold possibly contributing meds and follow outpatient Consider RUQ [place] with cholelithiasis and tumefactive sludge

## 2021-04-27 NOTE — Progress Notes (Signed)
Physical Therapy Treatment Patient Details Name: Gerald [name] MRN: [id number] DOB: 1940-08-20 Today's Date: 04/27/2021   History of Present Illness 81yo male who presented on 1/24 with fever, runny  nose, congestion. Covid negative. CT concerning for progression of known cancer. Received thoracentesis on 1/27. Admitted with PNA concerning for sepsis. PMH lung CA on chemo, bradycardia, CAD, COPD, HLD, cardiomyopathy    PT Comments    Pt AxO x 3 very pleasant already OOB in recliner on 2 lts at 96%.  Daughter present during session.  Assisted with amb pt full unit in hallway while monitoring sats.  General Gait Details: several standing rest breaks as sats decreased.  First amb on RA but quickly dropped to 81% after only 12 feet.  Applied 2 lts and cont to walk however sats only in mid 80"s.  Increased to 4 lts and continued to amb a total of 350 feet with sats avg 90%.  HR did well and was consistant at around 97.  No c/o dizziness.  No c/o dyspnea.  Pt repeated, "I feel fine" and wanted to keep walking.  SATURATION QUALIFICATIONS: (This note is used to comply with regulatory documentation for home oxygen)   Patient Saturations on Room Air at Rest = 89%   Patient Saturations on Room Air while Ambulating 12 feet = 81% (quick drop)   Patient Saturations on 4 Liters of oxygen while Ambulating  350 feet =90% HR 97   Please briefly explain why patient needs home oxygen:  Pt requires supplemental oxygen to achieve therapeutic levels.  Currently, 2 lts at rest and 4 lts with activity.  Also educated on use of incentive spirometer.     Recommendations for follow up therapy are one component of a multi-disciplinary discharge planning process, led by the attending physician.  Recommendations may be updated based on patient status, additional functional criteria and insurance authorization.  Follow Up Recommendations  Other (comment) (oxygen)     Assistance Recommended at Discharge    Patient can  return home with the following A little help with walking and/or transfers;Direct supervision/assist for medications management;Direct supervision/assist for financial management;Assistance with cooking/housework;Assist for transportation;A little help with bathing/dressing/bathroom   Equipment Recommendations  Rollator (4 wheels);BSC/3in1;Other (comment) (shower bench)    Recommendations for Other Services       Precautions / Restrictions Precautions Precautions: Fall Precaution Comments: watch sats, on active chemo Restrictions Weight Bearing Restrictions: No     Mobility  Bed Mobility               General bed mobility comments: OOB in recliner with family in room    Transfers Overall transfer level: Needs assistance Equipment used: None Transfers: Sit to/from Stand Sit to Stand: Supervision, Min guard           General transfer comment: caution to mutiple lines and one VC safety with turn completion    Ambulation/Gait Ambulation/Gait assistance: Supervision, Min guard Gait Distance (Feet): 350 Feet Assistive device: Rollator (4 wheels) Gait Pattern/deviations: Step-through pattern Gait velocity: decreased     General Gait Details: several standing rest breaks as sats decreased.  First amb on RA but quickly dropped to 81% after only 12 feet.  Applied 2 lts and cont to walk however sats only in mid 80"s.  Increased to 4 lts and continued to amb a total of 350 feet with sats avg 90%.  HR did well and was consistant at around 97.  No c/o dizziness.  No c/o dyspnea.  Pt repeated, "  I feel fine" and wanted to keep walking.   Stairs             Wheelchair Mobility    Modified Rankin (Stroke Patients Only)       Balance                                            Cognition Arousal/Alertness: Awake/alert Behavior During Therapy: WFL for tasks assessed/performed, Flat affect Overall Cognitive Status: Within Functional Limits for  tasks assessed                                 General Comments: AxO x 3 pleasant        Exercises      General Comments        Pertinent Vitals/Pain Pain Assessment Pain Assessment: No/denies pain    Home Living                          Prior Function            PT Goals (current goals can now be found in the care plan section) Progress towards PT goals: Progressing toward goals    Frequency    Min 3X/week      PT Plan Current plan remains appropriate    Co-evaluation              AM-PAC PT "6 Clicks" Mobility   Outcome Measure  Help needed turning from your back to your side while in a flat bed without using bedrails?: A Little Help needed moving from lying on your back to sitting on the side of a flat bed without using bedrails?: A Little Help needed moving to and from a bed to a chair (including a wheelchair)?: A Little Help needed standing up from a chair using your arms (e.g., wheelchair or bedside chair)?: A Little Help needed to walk in hospital room?: A Little Help needed climbing 3-5 steps with a railing? : A Lot 6 Click Score: 17    End of Session Equipment Utilized During Treatment: Gait belt;Oxygen Activity Tolerance: Patient tolerated treatment well Patient left: in chair;with call bell/phone within reach;with chair alarm set;with family/visitor present Nurse Communication: Mobility status PT Visit Diagnosis: Muscle weakness (generalized) (M62.81);Difficulty in walking, not elsewhere classified (R26.2)     Time: 1572-6203 PT Time Calculation (min) (ACUTE ONLY): 28 min  Charges:  $Gait Training: 23-37 mins                     {Vlasta Baskin  PTA Acute  Rehabilitation Services Pager      [phone] Office      [phone]

## 2021-04-27 NOTE — Progress Notes (Signed)
PROGRESS NOTE    Gerald [name]  LKG:[id number] DOB: 28-Jun-1940 DOA: 04/23/2021 PCP: [name], DO  Chief Complaint  Patient presents with   Fever    Brief Narrative:  Gerald [name] is Gerald [name] 81 y.o. 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  Has been having some runny nose and congestion.  Denies any nausea vomiting diarrhea abdominal pain or chest pain.   ED Course: In the ER patient was febrile with temperature of 102.1 F tachycardic with chest x-ray showing loculated right effusion with concerning features for acute pneumonia.  COVID test was negative.  High blood cultures drawn and started on empiric antibiotics.  Lab work also showed leukopenia but neutrophil count was more than 500.    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)   Centrilobular emphysema (HCC)   Pressure ulcer, stage 2 (HCC)   AKI (acute kidney injury) (Gerald [name])   Acute urinary retention   Liver function abnormality   Ectropion   Blepharitis   * CAP (community acquired pneumonia)- (present on admission) Presented with fever after chemo CT chest 1/25 with progression of disease with enlargement of RUL mass which appears to invade the right chest wall and anterior mediastinum, worsening R hilar and mediastinal LAD, enlarging thick walled, presumably malignant R pleural effusion and interval development of what appears to be lymphangitic spread of tumor throughout R lung IR didn't think chest tube would be indicated S/p thoracentesis today 1/27 Will request thora to eval effusion, r/u infection, discussed with oncology -> exudative, follow gram stain no organisms seen, culture NG <24 hrs, and cytology Continue ceftriaxone and azithromycin for  now Will add oral steroids with wheezing on exam Blood cx ng, urine cx ng   Respiratory failure, acute (Gerald [name]) Continuing to require O2, follow with treatment  Sepsis (Gerald [name]) Fever, leukopenia, and infection Treat as above  Exudative pleural effusion Request thora to eval for presence of infection Follow gram stain (without organism) and culture (NG<24) Follow cytology pending  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Gerald [name])- (present on admission) Follows with Dr. Julien Nordmann outpatient CT chest with findings concerning for progression of disease Will need to follow up after completion of treatment for infection  Centrilobular emphysema (Gerald [name])- (present on admission) Continue nebs Will add steroids, some wheezing noted  Pressure ulcer, stage 2 (Gerald [name])- (present on admission) Wound c/s  Liver function abnormality Elevated LFT's today Normal bili/alk phos, rising AST/ALT Follow acute hepatitis panel Hold crestor Will hold acitretin Continue to eval med list Consider RUQ [place]  Acute urinary retention S/p I&O cath Follow, seems to be resolved at this point   AKI (acute kidney injury) (Gerald [name]) improved  Blepharitis Nightly erythromycin ointment, follow ophtho follow up  Ectropion Follow with ophtho outpatient Nightly ointment   DVT prophylaxis: heparin -> SCD while awaiting IR eval Code Status: full Family Communication: wife, daughter Disposition:   Status is: Inpatient  Remains inpatient appropriate because: need for IR procedure       Consultants:  IR  Procedures:  none  Antimicrobials:  Anti-infectives (From admission, onward)    Start     Dose/Rate Route Frequency Ordered Stop   04/26/21 2200  azithromycin (ZITHROMAX) tablet 500 mg  Status:  Discontinued        500 mg Oral Daily at bedtime 04/26/21 0835 04/26/21 0838   04/24/21 0600  cefTRIAXone (ROCEPHIN) 2 g in sodium chloride 0.9 % 100 mL IVPB        2 g 200 mL/hr over 30 Minutes  Intravenous Every 24 hours 04/24/21 0156 04/29/21 0559   04/24/21 0200  azithromycin (ZITHROMAX) 500 mg in sodium chloride 0.9 % 250 mL IVPB        500 mg 250 mL/hr over 60 Minutes Intravenous Every 24 hours 04/24/21 0156 04/29/21 0159   04/23/21 2230  ceFEPIme (MAXIPIME) 2 g in sodium chloride 0.9 % 100 mL IVPB        2 g 200 mL/hr over 30 Minutes Intravenous  Once 04/23/21 2226 04/23/21 2336   04/23/21 2230  metroNIDAZOLE (FLAGYL) IVPB 500 mg        500 mg 100 mL/hr over 60 Minutes Intravenous  Once 04/23/21 2226 04/24/21 0050   04/23/21 2230  vancomycin (VANCOCIN) IVPB 1000 mg/200 mL premix        1,000 mg 200 mL/hr over 60 Minutes Intravenous  Once 04/23/21 2226 04/24/21 0212       Subjective: No new complaints  Objective: Vitals:   04/27/21 0400 04/27/21 0505 04/27/21 0726 04/27/21 1302  BP:  112/83  115/69  Pulse: 87 88  87  Resp: 17 20  18   Temp:  98.4 F (36.9 C)  98.3 F (36.8 C)  TempSrc:  Oral  Oral  SpO2: 97% 95% 96% 99%  Weight:      Height:        Intake/Output Summary (Last 24 hours) at 04/27/2021 1423 Last data filed at 04/27/2021 0400 Gross per 24 hour  Intake 259.54 ml  Output --  Net 259.54 ml   Filed Weights   04/23/21 2139 04/27/21 0133  Weight: 90.7 kg 89.9 kg    Examination:  General: No acute distress. Cardiovascular: RRR Lungs: coarse breath sounds, some scattered wheezing Abdomen: Soft, nontender, nondistended  Neurological: Alert and oriented 3. Moves all extremities 4 with equal strength. Cranial nerves II through XII grossly intact. Skin: Warm and dry. No rashes or lesions. Extremities: No clubbing or cyanosis. No edema.      Data Reviewed: I have personally reviewed following labs and imaging studies  CBC: Recent Labs  Lab 04/23/21 2215 04/24/21 0422 04/25/21 0353 04/26/21 0427 04/27/21 0425  WBC 3.5* 2.3* 2.2* 2.5* 2.1*  NEUTROABS 2.3 1.2* 1.5* 1.5* 1.2*  HGB 13.8 12.3* 11.9* 12.8* 12.0*  HCT 42.1 38.4* 35.7* 40.2  37.8*  MCV 96.6 96.5 96.0 98.8 98.2  PLT 111* 94* 84* 70* 75*    Basic Metabolic Panel: Recent Labs  Lab 04/23/21 2215 04/24/21 0422 04/25/21 0353 04/26/21 0427 04/27/21 0425  NA 133* 135 135 137 137  K 4.3 3.8 4.3 4.2 4.6  CL 98 101 101 100 102  CO2 27 28 27 28 27   GLUCOSE 110* 118* 119* 113* 132*  BUN 23 22 15 16 19   CREATININE 1.38* 1.06 1.02 0.91 0.89  CALCIUM 8.6* 8.2* 8.3* 8.6* 8.7*  MG  --   --   --  2.1 1.9  PHOS  --   --   --  3.3 2.9    GFR: Estimated Creatinine Clearance: 70.2 mL/min (by C-G formula based on SCr of 0.89 mg/dL).  Liver Function Tests: Recent Labs  Lab 04/22/21 1044 04/23/21 2215 04/26/21 0427 04/27/21 0425  AST 18 19 46* 106*  ALT  19 19 53* 110*  ALKPHOS 57 55 73 109  BILITOT 1.2 1.0 0.7 0.5  PROT 7.2 7.2 6.6 6.1*  ALBUMIN 4.1 3.5 3.0* 2.8*    CBG: No results for input(s): GLUCAP in the last 168 hours.   Recent Results (from the past 240 hour(s))  Resp Panel by RT-PCR (Flu Lon Klippel&B, Covid) Nasopharyngeal Swab     Status: None   Collection Time: 04/23/21 10:15 PM   Specimen: Nasopharyngeal Swab; Nasopharyngeal(NP) swabs in vial transport medium  Result Value Ref Range Status   SARS Coronavirus 2 by RT PCR NEGATIVE NEGATIVE Final    Comment: (NOTE) SARS-CoV-2 target nucleic acids are NOT DETECTED.  The SARS-CoV-2 RNA is generally detectable in upper respiratory specimens during the acute phase of infection. The lowest concentration of SARS-CoV-2 viral copies this assay can detect is 138 copies/mL. Gerald [name] negative result does not preclude SARS-Cov-2 infection and should not be used as the sole basis for treatment or other patient management decisions. Gerald [name] negative result may occur with  improper specimen collection/handling, submission of specimen other than nasopharyngeal swab, presence of viral mutation(s) within the areas targeted by this assay, and inadequate number of viral copies(<138 copies/mL). Gerald [name] negative result must be combined  with clinical observations, patient history, and epidemiological information. The expected result is Negative.  Fact Sheet for Patients:  EntrepreneurPulse.com.au  Fact Sheet for Healthcare Providers:  IncredibleEmployment.be  This test is no t yet approved or cleared by the [place] FDA and  has been authorized for detection and/or diagnosis of SARS-CoV-2 by FDA under an Emergency Use Authorization (EUA). This EUA will remain  in effect (meaning this test can be used) for the duration of the COVID-19 declaration under Section 564(b)(1) of the Act, 21 U.S.C.section 360bbb-3(b)(1), unless the authorization is terminated  or revoked sooner.       Influenza Rejina Odle by PCR NEGATIVE NEGATIVE Final   Influenza B by PCR NEGATIVE NEGATIVE Final    Comment: (NOTE) The Xpert Xpress SARS-CoV-2/FLU/RSV plus assay is intended as an aid in the diagnosis of influenza from Nasopharyngeal swab specimens and should not be used as Thamar Holik sole basis for treatment. Nasal washings and aspirates are unacceptable for Xpert Xpress SARS-CoV-2/FLU/RSV testing.  Fact Sheet for Patients: EntrepreneurPulse.com.au  Fact Sheet for Healthcare Providers: IncredibleEmployment.be  This test is not yet approved or cleared by the [place] FDA and has been authorized for detection and/or diagnosis of SARS-CoV-2 by FDA under an Emergency Use Authorization (EUA). This EUA will remain in effect (meaning this test can be used) for the duration of the COVID-19 declaration under Section 564(b)(1) of the Act, 21 U.S.C. section 360bbb-3(b)(1), unless the authorization is terminated or revoked.  Performed at [facility], [place] [address] [place], [place] [address]   Blood culture (routine x 2)     Status: None (Preliminary result)   Collection Time: 04/23/21 10:15 PM   Specimen: BLOOD RIGHT ARM  Result Value Ref Range Status    Specimen Description   Final    BLOOD RIGHT ARM Performed at [place] [address], [place], [place] [address]    Special Requests   Final    BOTTLES DRAWN AEROBIC AND ANAEROBIC Blood Culture results may not be optimal due to an inadequate volume of blood received in culture bottles Performed at [place] [address], [place], [place] [address]    Culture   Final    NO GROWTH 3 DAYS Performed at [name], [place] [address] [place], [place] [address]    Report Status PENDING  Incomplete  Blood culture (routine x 2)     Status: None (Preliminary result)   Collection Time: 04/23/21 10:15 PM   Specimen: BLOOD  Result Value Ref Range Status   Specimen Description   Final    BLOOD RIGHT ANTECUBITAL Performed at [facility], [place] [address] [place], [place] [address]    Special Requests   Final    BOTTLES DRAWN AEROBIC AND ANAEROBIC Blood Culture adequate volume Performed at [place] [address], [place], [place] [address]    Culture   Final    NO GROWTH 3 DAYS Performed at [facility], [place] [address], [place], [place] [address]    Report Status PENDING  Incomplete  Urine Culture     Status: None   Collection Time: 04/24/21  3:42 AM   Specimen: In/Out Cath Urine  Result Value Ref Range Status   Specimen Description   Final    IN/OUT CATH URINE Performed at [place] [address], [place], [place] [address]    Special Requests   Final    NONE Performed at [name], [place] [address], [place], [place] [address]    Culture   Final    NO GROWTH Performed at [facility], [place] [address], [place], [place] [address]    Report Status 04/24/2021 FINAL  Final  Body fluid culture w Gram Stain     Status: None (Preliminary result)   Collection Time: 04/26/21 11:15 AM   Specimen: PATH Cytology Pleural fluid  Result Value Ref Range Status    Specimen Description   Final    PLEURAL Performed at [place] [address], [place], [place] [address]    Special Requests   Final    NONE Performed at [name], [place] [address], [place], [place] [address]    Gram Stain   Final    WBC PRESENT, PREDOMINANTLY MONONUCLEAR NO ORGANISMS SEEN CYTOSPIN SMEAR    Culture   Final    NO GROWTH < 24 HOURS Performed at [place] [address] [place], [place] [address]    Report Status PENDING  Incomplete         Radiology Studies: DG Chest 1 View  Result Date: 04/26/2021 CLINICAL DATA:  Post right thoracentesis EXAM: CHEST  1 VIEW COMPARISON:  04/23/2021 FINDINGS: No pneumothorax. Persistent right pleural effusion. Right lung opacities and interstitial thickening as before. Lung aeration at the right lung base has decreased. Similar cardiomediastinal contours with volume loss in the right chest resulting in mediastinal shift. IMPRESSION: No pneumothorax. Persistent right pleural effusion. Persistent right pulmonary opacities and interstitial thickening with decreased aeration at the right lung base. Electronically Signed   By: Macy Mis M.D.   On: 04/26/2021 10:35   US THORACENTESIS ASP PLEURAL SPACE W/IMG GUIDE  Result Date: 04/26/2021 INDICATION: Patient with history of small cell lung cancer, pneumonia, COPD, right pleural effusion. Request received for diagnostic and therapeutic right thoracentesis. EXAM: ULTRASOUND GUIDED DIAGNOSTIC AND THERAPEUTIC RIGHT THORACENTESIS MEDICATIONS: 10 mL 1% lidocaine COMPLICATIONS: None immediate. PROCEDURE: An ultrasound guided thoracentesis was thoroughly discussed with the patient and questions answered. The benefits, risks, alternatives and complications were also discussed. The patient understands and wishes to proceed with the procedure. Written consent was obtained. Ultrasound was performed to localize and mark an  adequate pocket of  fluid in the right chest. The area was then prepped and draped in the normal sterile fashion. 1% Lidocaine was used for local anesthesia. Under ultrasound guidance Kathya Wilz 6 Fr Safe-T-Centesis catheter was introduced. Thoracentesis was performed. The catheter was removed and Jerrelle Michelsen dressing applied. FINDINGS: Sheridan Gettel total of approximately 110 cc of yellow fluid was removed. Samples were sent to the laboratory as requested by the clinical team. Only Davaughn Hillyard small amount of free pleural fluid was noted on today's ultrasound. Despite catheter manipulation only the above amount of fluid could be aspirated today. IMPRESSION: Successful ultrasound guided diagnostic and therapeutic right thoracentesis yielding 110 cc of pleural fluid. Read by: Rowe Robert, PA-C Electronically Signed   By: Jerilynn Mages.  Shick M.D.   On: 04/26/2021 13:01        Scheduled Meds:  aspirin EC  81 mg Oral Daily   erythromycin   Both Eyes QHS   ipratropium-albuterol  3 mL Nebulization Q6H   mouth rinse  15 mL Mouth Rinse BID   nicotine  21 mg Transdermal Daily   omega-3 acid ethyl esters  1 g Oral Daily   polyvinyl alcohol  2 drop Both Eyes QID   predniSONE  40 mg Oral Q breakfast   vitamin B-12  500 mcg Oral QPM   Continuous Infusions:  sodium chloride 10 mL/hr at 04/27/21 0400   azithromycin Stopped (04/27/21 0303)   cefTRIAXone (ROCEPHIN)  IV 2 g (04/27/21 0521)     LOS: 2 days    Time spent: over 30 min    Fayrene Helper, MD [facility]   To contact the attending provider between 7A-7P or the covering provider during after hours 7P-7A, please log into the web site [email or web address] and access using universal  password for that web site. If you do not have the password, please call the hospital operator.  04/27/2021, 2:23 PM   5

## 2021-04-27 NOTE — Assessment & Plan Note (Addendum)
Continues to improve, but still desatting with activity Requiring 2 L with activity on the day of discharge - suspect related to his acute illness, emphysema, and suspected pneumonia Follow with Dr. Valeta Harms outpatient Continue trelegy, will prescribe albuterol

## 2021-04-28 DIAGNOSIS — J189 Pneumonia, unspecified organism: Secondary | ICD-10-CM | POA: Diagnosis not present

## 2021-04-28 LAB — COMPREHENSIVE METABOLIC PANEL
ALT: 143 U/L — ABNORMAL HIGH (ref 0–44)
AST: 89 U/L — ABNORMAL HIGH (ref 15–41)
Albumin: 2.5 g/dL — ABNORMAL LOW (ref 3.5–5.0)
Alkaline Phosphatase: 115 U/L (ref 38–126)
Anion gap: 6 (ref 5–15)
BUN: 22 mg/dL (ref 8–23)
CO2: 30 mmol/L (ref 22–32)
Calcium: 8.6 mg/dL — ABNORMAL LOW (ref 8.9–10.3)
Chloride: 100 mmol/L (ref 98–111)
Creatinine, Ser: 0.78 mg/dL (ref 0.61–1.24)
GFR, Estimated: >60 mL/min (ref >60)
Glucose, Bld: 142 mg/dL — ABNORMAL HIGH (ref 70–99)
Potassium: 4.6 mmol/L (ref 3.5–5.1)
Sodium: 136 mmol/L (ref 135–145)
Total Bilirubin: 0.4 mg/dL (ref 0.3–1.2)
Total Protein: 5.8 g/dL — ABNORMAL LOW (ref 6.5–8.1)

## 2021-04-28 LAB — PHOSPHORUS: Phosphorus: 3.3 mg/dL (ref 2.5–4.6)

## 2021-04-28 LAB — CBC WITH DIFFERENTIAL/PLATELET
Abs Immature Granulocytes: 0.01 10*3/uL (ref 0.00–0.07)
Basophils Absolute: 0 10*3/uL (ref 0.0–0.1)
Basophils Relative: 1 %
Eosinophils Absolute: 0 10*3/uL (ref 0.0–0.5)
Eosinophils Relative: 1 %
HCT: 35.4 % — ABNORMAL LOW (ref 39.0–52.0)
Hemoglobin: 11.5 g/dL — ABNORMAL LOW (ref 13.0–17.0)
Immature Granulocytes: 1 %
Lymphocytes Relative: 21 %
Lymphs Abs: 0.4 10*3/uL — ABNORMAL LOW (ref 0.7–4.0)
MCH: 31.3 pg (ref 26.0–34.0)
MCHC: 32.5 g/dL (ref 30.0–36.0)
MCV: 96.5 fL (ref 80.0–100.0)
Monocytes Absolute: 0.5 10*3/uL (ref 0.1–1.0)
Monocytes Relative: 24 %
Neutro Abs: 1.1 10*3/uL — ABNORMAL LOW (ref 1.7–7.7)
Neutrophils Relative %: 52 %
Platelets: 83 10*3/uL — ABNORMAL LOW (ref 150–400)
RBC: 3.67 MIL/uL — ABNORMAL LOW (ref 4.22–5.81)
RDW: 13.2 % (ref 11.5–15.5)
WBC: 2 10*3/uL — ABNORMAL LOW (ref 4.0–10.5)
nRBC: 0 % (ref 0.0–0.2)

## 2021-04-28 LAB — HEPATITIS PANEL, ACUTE
HCV Ab: NONREACTIVE
Hep A IgM: NONREACTIVE
Hep B C IgM: NONREACTIVE
Hepatitis B Surface Ag: NONREACTIVE

## 2021-04-28 LAB — MAGNESIUM: Magnesium: 2.1 mg/dL (ref 1.7–2.4)

## 2021-04-28 MED ORDER — ZINC OXIDE 40 % EX OINT
TOPICAL_OINTMENT | Freq: Two times a day (BID) | CUTANEOUS | Status: DC
  Administered 2021-04-29 – 2021-04-30 (×2): 1 via TOPICAL
  Filled 2021-04-28 (×2): qty 57

## 2021-04-28 MED ORDER — TAMSULOSIN HCL 0.4 MG PO CAPS
0.4000 mg | ORAL_CAPSULE | Freq: Every day | ORAL | Status: DC
  Administered 2021-04-29: 0.4 mg via ORAL
  Filled 2021-04-28: qty 1

## 2021-04-28 MED ORDER — IPRATROPIUM-ALBUTEROL 0.5-2.5 (3) MG/3ML IN SOLN
3.0000 mL | Freq: Three times a day (TID) | RESPIRATORY_TRACT | Status: DC
  Administered 2021-04-28 – 2021-04-30 (×8): 3 mL via RESPIRATORY_TRACT
  Filled 2021-04-28 (×8): qty 3

## 2021-04-28 MED ORDER — TAMSULOSIN HCL 0.4 MG PO CAPS
0.4000 mg | ORAL_CAPSULE | Freq: Every day | ORAL | Status: DC
  Administered 2021-04-28: 0.4 mg via ORAL
  Filled 2021-04-28: qty 1

## 2021-04-28 NOTE — Consult Note (Signed)
[facility] Nurse Consult Note: Reason for Consult:Partial thickness area of skin loss. Dr. Abel Presto phot uploaded to EHR today is appreciated. Wound type:partial thickness, chronic Pressure Injury POA: Yes Measurement:Per Bedside RN yesterday, J. Hampton: 0.8cm x 0.9cm x 0.1cm Wound PPI:RJJO, dry Drainage (amount, consistency, odor) none Periwound: blanching erythema Dressing procedure/placement/frequency: Patient has been instructed to turn from side to side and minimize time in the supine position. Wife is treating with zinc oxide.  We will continue same while in house.   [place] nursing team will not follow, but will remain available to this patient, the nursing and medical teams.  Please re-consult if needed. Thanks, Maudie Flakes, MSN, RN, [place], Arther Abbott  Pager# [phone]

## 2021-04-28 NOTE — Progress Notes (Signed)
°PROGRESS NOTE ° ° ° °Gerald [name]  MRN:[id number] DOB: 07/27/1940 DOA: 04/23/2021 °PCP: Matthews, Cody, DO  °Chief Complaint  °Patient presents with  ° Fever  ° ° °Brief Narrative:  °Gerald [name] is a 80 y.o. 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  Has been having some runny nose and congestion.  Denies any nausea vomiting diarrhea abdominal pain or chest pain. °  °ED Course: In the ER patient was febrile with temperature of 102.1 °F tachycardic with chest x-ray showing loculated right effusion with concerning features for acute pneumonia.  COVID test was negative.  High blood cultures drawn and started on empiric antibiotics.  Lab work also showed leukopenia but neutrophil count was more than 500.  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) °  Centrilobular emphysema (HCC) °  Pressure ulcer, stage 2 (HCC) °  AKI (acute kidney injury) (HCC) °  Acute urinary retention °  Liver function abnormality °  Ectropion °  Blepharitis ° ° °* CAP (community acquired pneumonia)- (present on admission) °Presented with fever after chemo °CT chest 1/25 with progression of disease with enlargement of RUL mass which appears to invade the right chest wall and anterior mediastinum, worsening R hilar and mediastinal LAD, enlarging thick walled, presumably malignant R pleural effusion and interval development of what appears to be lymphangitic spread of tumor throughout R lung °IR didn't think chest tube would be indicated °S/p thoracentesis today 1/27 °Will request thora to eval effusion, r/u infection, discussed with oncology -> exudative, follow gram stain no organisms seen, culture NG x2, and cytology °Continue ceftriaxone and azithromycin for now °Will  add oral steroids x5 days with wheezing on exam °Blood cx ng, urine cx ng ° ° °Respiratory failure, acute (HCC) °Continuing to require O2, follow with treatment - wea as tolerated ° °Sepsis (HCC) °Fever, leukopenia, and infection °Treat as above ° °Exudative pleural effusion °Request thora to eval for presence of infection °Follow gram stain (without organism) and culture (NGx2) °Follow cytology pending ° °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 Mohamed outpatient °CT chest with findings concerning for progression of disease °Will need to follow up after completion of treatment for infection ° °Centrilobular emphysema (HCC)- (present on admission) °Continue nebs °Will add steroids, some wheezing noted ° °Pressure ulcer, stage 2 (HCC)- (present on admission) °Wound c/s, appreciate assistance ° °Liver function abnormality °Elevated LFT's today °Normal bili/alk phos.  AST improving, ALT rising.  °Follow acute hepatitis panel negative °Hold crestor °Will hold acitretin °Continue to eval med list °Consider RUQ US with cholelithiasis and tumefactive sludge ° °Acute urinary retention °S/p I&O cath °flomax °Has required I&O cath x3, will need to trend bladder scan and place foley to stay if recurrent ° ° °AKI (acute kidney injury) (HCC) °improved ° °Blepharitis °Nightly erythromycin ointment, follow °ophtho follow up ° °Ectropion °Follow with ophtho outpatient °Nightly ointment ° ° °DVT prophylaxis: heparin -> SCD while awaiting IR eval °Code Status: full °Family Communication: wife, daughter °Disposition:  ° °Status is: Inpatient ° °Remains inpatient appropriate because: need for IR procedure ° ° ° ° °  °Consultants:  °IR ° °Procedures:  °none ° °Antimicrobials:  °Anti-infectives (From   admission, onward)  ° ° Start     Dose/Rate Route Frequency Ordered Stop  ° 04/26/21 2200  azithromycin (ZITHROMAX) tablet 500 mg  Status:  Discontinued       ° 500 mg Oral Daily at  bedtime 04/26/21 0835 04/26/21 0838  ° 04/24/21 0600  cefTRIAXone (ROCEPHIN) 2 g in sodium chloride 0.9 % 100 mL IVPB       ° 2 g °200 mL/hr over 30 Minutes Intravenous Every 24 hours 04/24/21 0156 04/28/21 1403  ° 04/24/21 0200  azithromycin (ZITHROMAX) 500 mg in sodium chloride 0.9 % 250 mL IVPB       ° 500 mg °250 mL/hr over 60 Minutes Intravenous Every 24 hours 04/24/21 0156 04/28/21 1000  ° 04/23/21 2230  ceFEPIme (MAXIPIME) 2 g in sodium chloride 0.9 % 100 mL IVPB       ° 2 g °200 mL/hr over 30 Minutes Intravenous  Once 04/23/21 2226 04/23/21 2336  ° 04/23/21 2230  metroNIDAZOLE (FLAGYL) IVPB 500 mg       ° 500 mg °100 mL/hr over 60 Minutes Intravenous  Once 04/23/21 2226 04/24/21 0050  ° 04/23/21 2230  vancomycin (VANCOCIN) IVPB 1000 mg/200 mL premix       ° 1,000 mg °200 mL/hr over 60 Minutes Intravenous  Once 04/23/21 2226 04/24/21 0212  ° °  ° ° °Subjective: °No new complaints ° °Objective: °Vitals:  ° 04/28/21 0740 04/28/21 0745 04/28/21 1331 04/28/21 1449  °BP:    (!) 115/59  °Pulse:    76  °Resp:  15  16  °Temp:    98 °F (36.7 °C)  °TempSrc:      °SpO2: 96%  97% 97%  °Weight:      °Height:      ° ° °Intake/Output Summary (Last 24 hours) at 04/28/2021 1555 °Last data filed at 04/28/2021 1301 °Gross per 24 hour  °Intake 970 ml  °Output 1900 ml  °Net -930 ml  ° °Filed Weights  ° 04/23/21 2139 04/27/21 0133  °Weight: 90.7 kg 89.9 kg  ° ° °Examination: ° °General: No acute distress. °Cardiovascular: RRR °Lungs: coarse, unlabored, scattered wheezing °Abdomen: Soft, nontender, nondistended  °Neurological: Alert and oriented ×3. Moves all extremities ×4 . Cranial nerves II through XII grossly intact. °Skin: eraser sized erosion to R buttock °Extremities: No clubbing or cyanosis. No edema. ° ° °Data Reviewed: I have personally reviewed following labs and imaging studies ° °CBC: °Recent Labs  °Lab 04/24/21 °0422 04/25/21 °0353 04/26/21 °0427 04/27/21 °0425 04/28/21 °0521  °WBC 2.3* 2.2* 2.5* 2.1* 2.0*  °NEUTROABS  1.2* 1.5* 1.5* 1.2* 1.1*  °HGB 12.3* 11.9* 12.8* 12.0* 11.5*  °HCT 38.4* 35.7* 40.2 37.8* 35.4*  °MCV 96.5 96.0 98.8 98.2 96.5  °PLT 94* 84* 70* 75* 83*  ° ° °Basic Metabolic Panel: °Recent Labs  °Lab 04/24/21 °0422 04/25/21 °0353 04/26/21 °0427 04/27/21 °0425 04/28/21 °0521  °NA 135 135 137 137 136  °K 3.8 4.3 4.2 4.6 4.6  °CL 101 101 100 102 100  °CO2 28 27 28 27 30  °GLUCOSE 118* 119* 113* 132* 142*  °BUN 22 15 16 19 22  °CREATININE 1.06 1.02 0.91 0.89 0.78  °CALCIUM 8.2* 8.3* 8.6* 8.7* 8.6*  °MG  --   --  2.1 1.9 2.1  °PHOS  --   --  3.3 2.9 3.3  ° ° °GFR: °Estimated Creatinine Clearance: 78.1 mL/min (by C-G formula based on SCr of 0.78 mg/dL). ° °Liver Function Tests: °Recent Labs  °Lab 04/22/21 °  04/22/21 1044 04/23/21 2215 04/26/21 0427 04/27/21 0425 04/28/21 0521  AST 18 19 46* 106* 89*  ALT 19 19 53* 110* 143*  ALKPHOS 57 55 73 109 115  BILITOT 1.2 1.0 0.7 0.5 0.4  PROT 7.2 7.2 6.6 6.1* 5.8*  ALBUMIN 4.1 3.5 3.0* 2.8* 2.5*    CBG: No results for input(s): GLUCAP in the last 168 hours.   Recent Results (from the past 240 hour(s))  Resp Panel by RT-PCR (Flu Odile Veloso&B, Covid) Nasopharyngeal Swab     Status: None   Collection Time: 04/23/21 10:15 PM   Specimen: Nasopharyngeal Swab; Nasopharyngeal(NP) swabs in vial transport medium  Result Value Ref Range Status   SARS Coronavirus 2 by RT PCR NEGATIVE NEGATIVE Final    Comment: (NOTE) SARS-CoV-2 target nucleic acids are NOT DETECTED.  The SARS-CoV-2 RNA is generally detectable in upper respiratory specimens during the acute phase of infection. The lowest concentration of SARS-CoV-2 viral copies this assay can detect is 138 copies/mL. Edwina Grossberg negative result does not preclude SARS-Cov-2 infection and should not be used as the sole basis for treatment or other patient management decisions. Jenelle Drennon negative result may occur with  improper specimen collection/handling, submission of specimen other than nasopharyngeal swab, presence of viral mutation(s) within  the areas targeted by this assay, and inadequate number of viral copies(<138 copies/mL). Tavian Callander negative result must be combined with clinical observations, patient history, and epidemiological information. The expected result is Negative.  Fact Sheet for Patients:  EntrepreneurPulse.com.au  Fact Sheet for Healthcare Providers:  IncredibleEmployment.be  This test is no t yet approved or cleared by the [place] FDA and  has been authorized for detection and/or diagnosis of SARS-CoV-2 by FDA under an Emergency Use Authorization (EUA). This EUA will remain  in effect (meaning this test can be used) for the duration of the COVID-19 declaration under Section 564(b)(1) of the Act, 21 U.S.C.section 360bbb-3(b)(1), unless the authorization is terminated  or revoked sooner.       Influenza Abran Gavigan by PCR NEGATIVE NEGATIVE Final   Influenza B by PCR NEGATIVE NEGATIVE Final    Comment: (NOTE) The Xpert Xpress SARS-CoV-2/FLU/RSV plus assay is intended as an aid in the diagnosis of influenza from Nasopharyngeal swab specimens and should not be used as Cherese Lozano sole basis for treatment. Nasal washings and aspirates are unacceptable for Xpert Xpress SARS-CoV-2/FLU/RSV testing.  Fact Sheet for Patients: EntrepreneurPulse.com.au  Fact Sheet for Healthcare Providers: IncredibleEmployment.be  This test is not yet approved or cleared by the [place] FDA and has been authorized for detection and/or diagnosis of SARS-CoV-2 by FDA under an Emergency Use Authorization (EUA). This EUA will remain in effect (meaning this test can be used) for the duration of the COVID-19 declaration under Section 564(b)(1) of the Act, 21 U.S.C. section 360bbb-3(b)(1), unless the authorization is terminated or revoked.  Performed at [facility], [place] [address], [place], [place] [address]   Blood culture (routine x 2)     Status:  None (Preliminary result)   Collection Time: 04/23/21 10:15 PM   Specimen: BLOOD RIGHT ARM  Result Value Ref Range Status   Specimen Description   Final    BLOOD RIGHT ARM Performed at [facility] [address], [place], [place] [address]    Special Requests   Final    BOTTLES DRAWN AEROBIC AND ANAEROBIC Blood Culture results may not be optimal due to an inadequate volume of blood received in culture bottles Performed at [facility],  [address] [place], [place] [address] °  ° Culture   Final  °  NO GROWTH 4 DAYS °Performed at [facility], [address] [place], [place] [address] °  ° Report Status PENDING  Incomplete  °Blood culture (routine x 2)     Status: None (Preliminary result)  ° Collection Time: 04/23/21 10:15 PM  ° Specimen: BLOOD  °Result Value Ref Range Status  ° Specimen Description   Final  °  BLOOD RIGHT ANTECUBITAL °Performed at [facility], [address] [place], [place] [address] °  ° Special Requests   Final  °  BOTTLES DRAWN AEROBIC AND ANAEROBIC Blood Culture adequate volume °Performed at [facility], [address] [place], [place] [address] °  ° Culture   Final  °  NO GROWTH 4 DAYS °Performed at [facility], [address] [place], [place] [address] °  ° Report Status PENDING  Incomplete  °Urine Culture     Status: None  ° Collection Time: 04/24/21  3:42 AM  ° Specimen: In/Out Cath Urine  °Result Value Ref Range Status  ° Specimen Description   Final  °  IN/OUT CATH URINE °Performed at [facility], [address] [place], [place] [address] °  ° Special Requests   Final  °  NONE °Performed at [facility], [address] [place], [place] [address] °  ° Culture   Final  °  NO GROWTH °Performed at [facility], [address] [place], [place] [address] °  ° Report Status 04/24/2021 FINAL  Final  °Body fluid culture w Gram Stain     Status: None  (Preliminary result)  ° Collection Time: 04/26/21 11:15 AM  ° Specimen: PATH Cytology Pleural fluid  °Result Value Ref Range Status  ° Specimen Description   Final  °  PLEURAL °Performed at [facility], [address] [place], [place] [address] °  ° Special Requests   Final  °  NONE °Performed at [facility], [address] [place], [place] [address] °  ° Gram Stain   Final  °  WBC PRESENT, PREDOMINANTLY MONONUCLEAR °NO ORGANISMS SEEN °CYTOSPIN SMEAR °  ° Culture   Final  °  NO GROWTH 2 DAYS °Performed at [facility], [address] [place], [place] [address] °  ° Report Status PENDING  Incomplete  °  ° ° ° ° ° °Radiology Studies: °US Abdomen Limited RUQ (LIVER/GB) ° °Result Date: 04/27/2021 °CLINICAL DATA:  Elevated liver function test. EXAM: ULTRASOUND ABDOMEN LIMITED RIGHT UPPER QUADRANT COMPARISON:  Head CT 03/13/2021 FINDINGS: Gallbladder: Layering calcified stones within the gallbladder lumen. Avascular non mobile echogenic foci adherent along the gallbladder wall may represent tumefactive sludge. No gallbladder wall thickening or pericholecystic fluid visualized. Right side in no sonographic Murphy sign noted by sonographer. Common bile duct: Diameter: 4 mm. Liver: No focal lesion identified. Within normal limits in parenchymal echogenicity. Portal vein is patent on color Doppler imaging with normal direction of blood flow towards the liver. Other: None. IMPRESSION: Cholelithiasis and possibly tumefactive sludge. No acute cholecystitis. Electronically Signed   By: Morgane  Naveau M.D.   On: 04/27/2021 21:27   ° ° ° ° ° °Scheduled Meds: ° aspirin EC  81 mg Oral Daily  ° erythromycin   Both Eyes QHS  ° ipratropium-albuterol  3 mL Nebulization TID  ° liver oil-zinc oxide   Topical BID  °   mouth rinse  15 mL Mouth Rinse BID  ° nicotine  21 mg Transdermal Daily  ° omega-3 acid ethyl esters  1 g Oral Daily  ° polyvinyl alcohol  2 drop Both Eyes QID  ° predniSONE  40  mg Oral Q breakfast  ° tamsulosin  0.4 mg Oral QPC supper  ° vitamin B-12  500 mcg Oral QPM  ° °Continuous Infusions: ° sodium chloride 10 mL/hr at 04/27/21 0400  ° ° ° LOS: 3 days  ° ° °Time spent: over 30 min ° ° ° °Caldwell Powell, MD °[facility] ° ° °To contact the attending provider between 7A-7P or the covering provider during after hours 7P-7A, please log into the web site [email or web address] and access using universal [place] password for that web site. If you do not have the password, please call the hospital operator. ° °04/28/2021, 3:55 PM  ° °5 °

## 2021-04-29 ENCOUNTER — Ambulatory Visit: Payer: Medicare Other | Admitting: Family Medicine

## 2021-04-29 DIAGNOSIS — J189 Pneumonia, unspecified organism: Secondary | ICD-10-CM | POA: Diagnosis not present

## 2021-04-29 DIAGNOSIS — D61818 Other pancytopenia: Secondary | ICD-10-CM

## 2021-04-29 LAB — CBC WITH DIFFERENTIAL/PLATELET
Abs Immature Granulocytes: 0.01 10*3/uL (ref 0.00–0.07)
Basophils Absolute: 0 10*3/uL (ref 0.0–0.1)
Basophils Relative: 1 %
Eosinophils Absolute: 0 10*3/uL (ref 0.0–0.5)
Eosinophils Relative: 2 %
HCT: 37.9 % — ABNORMAL LOW (ref 39.0–52.0)
Hemoglobin: 11.9 g/dL — ABNORMAL LOW (ref 13.0–17.0)
Immature Granulocytes: 1 %
Lymphocytes Relative: 24 %
Lymphs Abs: 0.5 10*3/uL — ABNORMAL LOW (ref 0.7–4.0)
MCH: 31 pg (ref 26.0–34.0)
MCHC: 31.4 g/dL (ref 30.0–36.0)
MCV: 98.7 fL (ref 80.0–100.0)
Monocytes Absolute: 0.5 10*3/uL (ref 0.1–1.0)
Monocytes Relative: 23 %
Neutro Abs: 1.1 10*3/uL — ABNORMAL LOW (ref 1.7–7.7)
Neutrophils Relative %: 49 %
Platelets: 117 10*3/uL — ABNORMAL LOW (ref 150–400)
RBC: 3.84 MIL/uL — ABNORMAL LOW (ref 4.22–5.81)
RDW: 13.2 % (ref 11.5–15.5)
WBC: 2.2 10*3/uL — ABNORMAL LOW (ref 4.0–10.5)
nRBC: 0 % (ref 0.0–0.2)

## 2021-04-29 LAB — CULTURE, BLOOD (ROUTINE X 2)
Culture: NO GROWTH
Culture: NO GROWTH
Special Requests: ADEQUATE

## 2021-04-29 LAB — BODY FLUID CULTURE W GRAM STAIN: Culture: NO GROWTH

## 2021-04-29 LAB — CYTOLOGY - NON PAP

## 2021-04-29 LAB — COMPREHENSIVE METABOLIC PANEL
ALT: 186 U/L — ABNORMAL HIGH (ref 0–44)
AST: 114 U/L — ABNORMAL HIGH (ref 15–41)
Albumin: 2.6 g/dL — ABNORMAL LOW (ref 3.5–5.0)
Alkaline Phosphatase: 103 U/L (ref 38–126)
Anion gap: 7 (ref 5–15)
BUN: 22 mg/dL (ref 8–23)
CO2: 28 mmol/L (ref 22–32)
Calcium: 8.7 mg/dL — ABNORMAL LOW (ref 8.9–10.3)
Chloride: 103 mmol/L (ref 98–111)
Creatinine, Ser: 0.81 mg/dL (ref 0.61–1.24)
GFR, Estimated: >60 mL/min (ref >60)
Glucose, Bld: 116 mg/dL — ABNORMAL HIGH (ref 70–99)
Potassium: 4.3 mmol/L (ref 3.5–5.1)
Sodium: 138 mmol/L (ref 135–145)
Total Bilirubin: 0.4 mg/dL (ref 0.3–1.2)
Total Protein: 5.8 g/dL — ABNORMAL LOW (ref 6.5–8.1)

## 2021-04-29 LAB — MAGNESIUM: Magnesium: 2 mg/dL (ref 1.7–2.4)

## 2021-04-29 LAB — PHOSPHORUS: Phosphorus: 3.5 mg/dL (ref 2.5–4.6)

## 2021-04-29 MED ORDER — CHLORHEXIDINE GLUCONATE CLOTH 2 % EX PADS
6.0000 | MEDICATED_PAD | Freq: Every day | CUTANEOUS | Status: DC
  Administered 2021-04-29 – 2021-04-30 (×2): 6 via TOPICAL

## 2021-04-29 MED ORDER — ENOXAPARIN SODIUM 40 MG/0.4ML IJ SOSY
40.0000 mg | PREFILLED_SYRINGE | INTRAMUSCULAR | Status: DC
  Administered 2021-04-29: 40 mg via SUBCUTANEOUS
  Filled 2021-04-29: qty 0.4

## 2021-04-29 NOTE — Progress Notes (Signed)
SATURATION QUALIFICATIONS:  Patient Saturations on Room Air at Rest = 94%  Patient Saturations on Room Air while Ambulating = 82%  Patient Saturations on 1 Liters of oxygen while Ambulating = 98%  Please briefly explain why patient needs home oxygen: Desaturation on room air

## 2021-04-29 NOTE — Progress Notes (Signed)
Overnight oxygen wean down attempt from 2L >96% to 1L 94%.. tolerating, will continue to monitor

## 2021-04-29 NOTE — Progress Notes (Signed)
Patient up to chair for  30 minutes after using urinal. Incentive spirometer encouraged and used with highest level  reaching 800. Remains at 1L maintaining above goal >92%

## 2021-04-29 NOTE — Plan of Care (Signed)

## 2021-04-29 NOTE — Progress Notes (Signed)
Physical Therapy Treatment Patient Details Name: Gerald [name] MRN: [id number] DOB: [birth year] Today's Date: 04/29/2021   History of Present Illness 81yo male who presented on 1/24 with fever, runny  nose, congestion. Covid negative. CT concerning for progression of known cancer. Received thoracentesis on 1/27. Admitted with PNA concerning for sepsis. PMH lung CA on chemo, bradycardia, CAD, COPD, HLD, cardiomyopathy    PT Comments    Patient making good progress with mobility and new goal to ambulated ~3x/day discussed with pt and family. Supervision only for transfers with RW and min guard assist for safety with gait. Pt desats on RA with gait to low of 86% and able to recover to ~90% on 2 L/min but achieved 94% on 3L/min with gait. No overt LOB noted but educated pt on need for assistance to mobilize in hallway. Reviewed need for O2 and proper donning of O2 line so pt can rest on RA to continue to wean as able. RN notified. Will continue to progress pt's mobility as able.   SATURATION QUALIFICATIONS: (This note is used to comply with regulatory documentation for home oxygen)   Patient Saturations on Room Air at Rest = 91%   Patient Saturations on Room Air while Ambulating = 86%   Patient Saturations on 3 Liters of oxygen while Ambulating = 94%   Please briefly explain why patient needs home oxygen: For longer functional distances pt requires 3L/min to maintain SpO2 greater than 92% with gait. He recovers well at rest and SpO2 reach 97% on 3L at rest following gait. Will wean as able.    Recommendations for follow up therapy are one component of a multi-disciplinary discharge planning process, led by the attending physician.  Recommendations may be updated based on patient status, additional functional criteria and insurance authorization.  Follow Up Recommendations  Home health PT     Assistance Recommended at Discharge Frequent or constant Supervision/Assistance  Patient can return  home with the following A little help with walking and/or transfers;Direct supervision/assist for medications management;Direct supervision/assist for financial management;Assistance with cooking/housework;Assist for transportation;A little help with bathing/dressing/bathroom   Equipment Recommendations  Rollator (4 wheels);BSC/3in1;Other (comment)    Recommendations for Other Services       Precautions / Restrictions Precautions Precautions: Fall Precaution Comments: watch sats, on active chemo Restrictions Weight Bearing Restrictions: No     Mobility  Bed Mobility               General bed mobility comments: OOB in recliner    Transfers Overall transfer level: Needs assistance Equipment used: Rolling walker (2 wheels) Transfers: Sit to/from Stand Sit to Stand: Supervision           General transfer comment: supervision for safety and line set up. pt able to power up without assist and steady transitioning hands to RW. pt with good control to lower to chair.    Ambulation/Gait Ambulation/Gait assistance: Supervision, Min guard Gait Distance (Feet): 220 Feet Assistive device: Rolling walker (2 wheels) Gait Pattern/deviations: Step-through pattern, Decreased stride length, Drifts right/left Gait velocity: decr     General Gait Details: overall steady gait with occasional sway/drift and BOS narrow intermittently. no overt LOB noted throughout.   Stairs             Wheelchair Mobility    Modified Rankin (Stroke Patients Only)       Balance Overall balance assessment: Mild deficits observed, not formally tested  Cognition Arousal/Alertness: Awake/alert Behavior During Therapy: WFL for tasks assessed/performed, Flat affect Overall Cognitive Status: Within Functional Limits for tasks assessed                                          Exercises      General Comments         Pertinent Vitals/Pain Pain Assessment Pain Assessment: No/denies pain Pain Intervention(s): Monitored during session    Home Living                          Prior Function            PT Goals (current goals can now be found in the care plan section) Acute Rehab PT Goals Patient Stated Goal: go home when medically ready, walk more PT Goal Formulation: With patient/family Time For Goal Achievement: 05/10/21 Potential to Achieve Goals: Good Progress towards PT goals: Progressing toward goals    Frequency    Min 3X/week      PT Plan Current plan remains appropriate    Co-evaluation              AM-PAC PT "6 Clicks" Mobility   Outcome Measure  Help needed turning from your back to your side while in a flat bed without using bedrails?: A Little Help needed moving from lying on your back to sitting on the side of a flat bed without using bedrails?: A Little Help needed moving to and from a bed to a chair (including a wheelchair)?: A Little Help needed standing up from a chair using your arms (e.g., wheelchair or bedside chair)?: A Little Help needed to walk in hospital room?: A Little Help needed climbing 3-5 steps with a railing? : A Little 6 Click Score: 18    End of Session Equipment Utilized During Treatment: Gait belt;Oxygen Activity Tolerance: Patient tolerated treatment well Patient left: in chair;with call bell/phone within reach;with chair alarm set;with family/visitor present   PT Visit Diagnosis: Muscle weakness (generalized) (M62.81);Difficulty in walking, not elsewhere classified (R26.2)     Time: 9528-4132 PT Time Calculation (min) (ACUTE ONLY): 26 min  Charges:  $Gait Training: 8-22 mins $Therapeutic Activity: 8-22 mins                     Verner Mould, DPT Acute Rehabilitation Services Office [phone] Pager [phone]    Jacques Navy 04/29/2021, 3:41 PM

## 2021-04-29 NOTE — Care Management Important Message (Signed)
Important Message  Patient Details IM Letter placed in Patients room. Name: Gerald [name] MRN: [id number] Date of Birth: 10-Feb-1941   Medicare Important Message Given:  Yes     Kerin Salen 04/29/2021, 2:41 PM

## 2021-04-29 NOTE — Progress Notes (Signed)
0600 bladder scan  reflected >498 mls.Per order foley inserted  with 800  ml  urine immediately drained after insertion

## 2021-04-29 NOTE — Progress Notes (Signed)
SATURATION QUALIFICATIONS: (This note is used to comply with regulatory documentation for home oxygen)  Patient Saturations on Room Air at Rest = 91%  Patient Saturations on Room Air while Ambulating = 86%  Patient Saturations on 3 Liters of oxygen while Ambulating = 94%  Please briefly explain why patient needs home oxygen: For longer functional distances pt requires 3L/min to maintain SpO2 greater than 92% with gait. He recovers well at rest and SpO2 reach 97% on 3L at rest following gait. Will wean as able.    Verner Mould, DPT Acute Rehabilitation Services Office [phone] Pager [phone]

## 2021-04-29 NOTE — Assessment & Plan Note (Signed)
Likely related to chemo, will follow

## 2021-04-29 NOTE — Progress Notes (Addendum)
PROGRESS NOTE    Gerald [name]  LNL:[id number] DOB: Dec 18, 1940 DOA: 04/23/2021 PCP: [name], DO  Chief Complaint  Patient presents with   Fever    Brief Narrative:  Gerald [name] is Gerald [name] 81 y.o. 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  Has been having some runny nose and congestion.  Denies any nausea vomiting diarrhea abdominal pain or chest pain.   ED Course: In the ER patient was febrile with temperature of 102.1 F tachycardic with chest x-ray showing loculated right effusion with concerning features for acute pneumonia.  COVID test was negative.  High blood cultures drawn and started on empiric antibiotics.  Lab work also showed leukopenia but neutrophil count was more than 500.    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)   Centrilobular emphysema (HCC)   Pressure ulcer, stage 2 (HCC)   AKI (acute kidney injury) ([place])   Acute urinary retention   Liver function abnormality   Ectropion   Blepharitis   Assessment and Plan: * CAP (community acquired pneumonia)- (present on admission) Presented with fever after chemo CT chest 1/25 with progression of disease with enlargement of RUL mass which appears to invade the right chest wall and anterior mediastinum, worsening R hilar and mediastinal LAD, enlarging thick walled, presumably malignant R pleural effusion and interval development of what appears to be lymphangitic spread of tumor throughout R lung IR didn't think chest tube would be indicated S/p thoracentesis today 1/27 Will request thora to eval effusion, r/u infection, discussed with oncology -> exudative, follow gram stain no organisms seen, culture NG, and cytology without malignant cells (limiting  inflammation) He's completed abx Will add oral steroids x5 days with wheezing on exam (improving) Blood cx ng, urine cx ng   Respiratory failure, acute (HCC) Continues to improve, but still desatting with activity May need O2 at home  Sepsis ([place]) Fever, leukopenia, and infection Treat as above  Exudative pleural effusion Request thora to eval for presence of infection Follow gram stain (without organism) and culture (NGx2) Follow cytology pending as above  Pancytopenia ([place]) Likely related to chemo, will follow  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 Julien Nordmann outpatient CT chest with findings concerning for progression of disease -- he's aware, it's very early in treatment, too early to assess for response, he'll follow with them outpatient Will need to follow up after completion of treatment for infection  Centrilobular emphysema ([place])- (present on admission) Continue nebs Will add steroids, some wheezing noted  Pressure ulcer, stage 2 ([place])- (present on admission) Wound c/s, appreciate assistance  Liver function abnormality Elevated LFT's today - fluctuating Normal bili/alk phos.  AST improving, ALT rising.  Follow acute hepatitis panel negative Hold crestor Will hold acitretin Continue to eval med list - discussed with pharmacy - holding lovaza as well, prednisone possibly, but this was started after, continue for now Consider RUQ [place] with cholelithiasis and tumefactive sludge  Acute urinary retention Now with indwelling foley Will need to discharge with this and follow with urology outpatient flomax   AKI (acute kidney injury) ([place]) improved  Blepharitis Nightly erythromycin ointment, follow ophtho follow up  Ectropion Follow with ophtho outpatient Nightly  ointment   DVT prophylaxis: lovenox, SCD Code Status: full Family Communication: wife, daughter, son Disposition:   Status is:  Inpatient  Remains inpatient appropriate because: need for IR procedure       Consultants:  IR  Procedures:  none  Antimicrobials:  Anti-infectives (From admission, onward)    Start     Dose/Rate Route Frequency Ordered Stop   04/26/21 2200  azithromycin (ZITHROMAX) tablet 500 mg  Status:  Discontinued        500 mg Oral Daily at bedtime 04/26/21 0835 04/26/21 0838   04/24/21 0600  cefTRIAXone (ROCEPHIN) 2 g in sodium chloride 0.9 % 100 mL IVPB        2 g 200 mL/hr over 30 Minutes Intravenous Every 24 hours 04/24/21 0156 04/28/21 1403   04/24/21 0200  azithromycin (ZITHROMAX) 500 mg in sodium chloride 0.9 % 250 mL IVPB        500 mg 250 mL/hr over 60 Minutes Intravenous Every 24 hours 04/24/21 0156 04/28/21 1000   04/23/21 2230  ceFEPIme (MAXIPIME) 2 g in sodium chloride 0.9 % 100 mL IVPB        2 g 200 mL/hr over 30 Minutes Intravenous  Once 04/23/21 2226 04/23/21 2336   04/23/21 2230  metroNIDAZOLE (FLAGYL) IVPB 500 mg        500 mg 100 mL/hr over 60 Minutes Intravenous  Once 04/23/21 2226 04/24/21 0050   04/23/21 2230  vancomycin (VANCOCIN) IVPB 1000 mg/200 mL premix        1,000 mg 200 mL/hr over 60 Minutes Intravenous  Once 04/23/21 2226 04/24/21 0212       Subjective: No new complaints Daughter and son and wife with lots of concerns about him coming home  Objective: Vitals:   04/29/21 0349 04/29/21 0828 04/29/21 1309 04/29/21 1455  BP: 121/66  127/72   Pulse: 78  78   Resp: 18  20   Temp: 97.6 F (36.4 C) 98 F (36.7 C) 98 F (36.7 C)   TempSrc:   Oral   SpO2: 98% 98% 96% 96%  Weight:      Height:        Intake/Output Summary (Last 24 hours) at 04/29/2021 1940 Last data filed at 04/29/2021 1800 Gross per 24 hour  Intake 720 ml  Output 1800 ml  Net -1080 ml   Filed Weights   04/23/21 2139 04/27/21 0133  Weight: 90.7 kg 89.9 kg    Examination:  General: No acute distress. Cardiovascular: RRR Lungs: unlabored - wheezing has  improved Abdomen: Soft, nontender, nondistended Neurological: Alert and oriented 3. Moves all extremities 4 . Cranial nerves II through XII grossly intact. Skin: Warm and dry. No rashes or lesions. Extremities: No clubbing or cyanosis. No edema   Data Reviewed: I have personally reviewed following labs and imaging studies  CBC: Recent Labs  Lab 04/25/21 0353 04/26/21 0427 04/27/21 0425 04/28/21 0521 04/29/21 0509  WBC 2.2* 2.5* 2.1* 2.0* 2.2*  NEUTROABS 1.5* 1.5* 1.2* 1.1* 1.1*  HGB 11.9* 12.8* 12.0* 11.5* 11.9*  HCT 35.7* 40.2 37.8* 35.4* 37.9*  MCV 96.0 98.8 98.2 96.5 98.7  PLT 84* 70* 75* 83* 117*    Basic Metabolic Panel: Recent Labs  Lab 04/25/21 0353 04/26/21 0427 04/27/21 0425 04/28/21 0521 04/29/21 0509  NA 135 137 137 136 138  K 4.3 4.2 4.6 4.6 4.3  CL 101 100 102 100 103  CO2 27 28 27 30 28   GLUCOSE 119* 113* 132* 142* 116*  BUN 15  16 19 22 22   CREATININE 1.02 0.91 0.89 0.78 0.81  CALCIUM 8.3* 8.6* 8.7* 8.6* 8.7*  MG  --  2.1 1.9 2.1 2.0  PHOS  --  3.3 2.9 3.3 3.5    GFR: Estimated Creatinine Clearance: 77.2 mL/min (by C-G formula based on SCr of 0.81 mg/dL).  Liver Function Tests: Recent Labs  Lab 04/23/21 2215 04/26/21 0427 04/27/21 0425 04/28/21 0521 04/29/21 0509  AST 19 46* 106* 89* 114*  ALT 19 53* 110* 143* 186*  ALKPHOS 55 73 109 115 103  BILITOT 1.0 0.7 0.5 0.4 0.4  PROT 7.2 6.6 6.1* 5.8* 5.8*  ALBUMIN 3.5 3.0* 2.8* 2.5* 2.6*    CBG: No results for input(s): GLUCAP in the last 168 hours.   Recent Results (from the past 240 hour(s))  Resp Panel by RT-PCR (Flu [place]&B, Covid) Nasopharyngeal Swab     Status: None   Collection Time: 04/23/21 10:15 PM   Specimen: Nasopharyngeal Swab; Nasopharyngeal(NP) swabs in vial transport medium  Result Value Ref Range Status   SARS Coronavirus 2 by RT PCR NEGATIVE NEGATIVE Final    Comment: (NOTE) SARS-CoV-2 target nucleic acids are NOT DETECTED.  The SARS-CoV-2 RNA is generally detectable  in upper respiratory specimens during the acute phase of infection. The lowest concentration of SARS-CoV-2 viral copies this assay can detect is 138 copies/mL. Gerald [name] negative result does not preclude SARS-Cov-2 infection and should not be used as the sole basis for treatment or other patient management decisions. Gerald [name] negative result may occur with  improper specimen collection/handling, submission of specimen other than nasopharyngeal swab, presence of viral mutation(s) within the areas targeted by this assay, and inadequate number of viral copies(<138 copies/mL). Gerald [name] negative result must be combined with clinical observations, patient history, and epidemiological information. The expected result is Negative.  Fact Sheet for Patients:  EntrepreneurPulse.com.au  Fact Sheet for Healthcare Providers:  IncredibleEmployment.be  This test is no t yet approved or cleared by the [place] FDA and  has been authorized for detection and/or diagnosis of SARS-CoV-2 by FDA under an Emergency Use Authorization (EUA). This EUA will remain  in effect (meaning this test can be used) for the duration of the COVID-19 declaration under Section 564(b)(1) of the Act, 21 U.S.C.section 360bbb-3(b)(1), unless the authorization is terminated  or revoked sooner.       Influenza Gerald [name] by PCR NEGATIVE NEGATIVE Final   Influenza B by PCR NEGATIVE NEGATIVE Final    Comment: (NOTE) The Xpert Xpress SARS-CoV-2/FLU/RSV plus assay is intended as an aid in the diagnosis of influenza from Nasopharyngeal swab specimens and should not be used as Gerald [name] sole basis for treatment. Nasal washings and aspirates are unacceptable for Xpert Xpress SARS-CoV-2/FLU/RSV testing.  Fact Sheet for Patients: EntrepreneurPulse.com.au  Fact Sheet for Healthcare Providers: IncredibleEmployment.be  This test is not yet approved or cleared by the [place] FDA and has  been authorized for detection and/or diagnosis of SARS-CoV-2 by FDA under an Emergency Use Authorization (EUA). This EUA will remain in effect (meaning this test can be used) for the duration of the COVID-19 declaration under Section 564(b)(1) of the Act, 21 U.S.C. section 360bbb-3(b)(1), unless the authorization is terminated or revoked.  Performed at [facility], [place] [address] [place], [place] [address]   Blood culture (routine x 2)     Status: None   Collection Time: 04/23/21 10:15 PM   Specimen: BLOOD RIGHT ARM  Result Value Ref Range Status   Specimen Description  Final    BLOOD RIGHT ARM Performed at [facility] [address] [place], [place] [address]    Special Requests   Final    BOTTLES DRAWN AEROBIC AND ANAEROBIC Blood Culture results may not be optimal due to an inadequate volume of blood received in culture bottles Performed at [facility] [address], [place], [place] [address]    Culture   Final    NO GROWTH 5 DAYS Performed at [facility], [place] [address] [place], [place] [address]    Report Status 04/29/2021 FINAL  Final  Blood culture (routine x 2)     Status: None   Collection Time: 04/23/21 10:15 PM   Specimen: BLOOD  Result Value Ref Range Status   Specimen Description   Final    BLOOD RIGHT ANTECUBITAL Performed at [facility], [place] [address] [place], [place] [address]    Special Requests   Final    BOTTLES DRAWN AEROBIC AND ANAEROBIC Blood Culture adequate volume Performed at [facility] [address] [place], [place] [address]    Culture   Final    NO GROWTH 5 DAYS Performed at [facility], [place] [address], [place], [place] [address]    Report Status 04/29/2021 FINAL  Final  Urine Culture     Status: None   Collection Time: 04/24/21  3:42 AM   Specimen: In/Out Cath Urine  Result Value Ref Range Status   Specimen Description   Final     IN/OUT CATH URINE Performed at [facility] [address] [place], [place] [address]    Special Requests   Final    NONE Performed at [facility], [place] [address], [place], [place] [address]    Culture   Final    NO GROWTH Performed at [facility], [place] [address] [place], [place] [address]    Report Status 04/24/2021 FINAL  Final  Body fluid culture w Gram Stain     Status: None   Collection Time: 04/26/21 11:15 AM   Specimen: PATH Cytology Pleural fluid  Result Value Ref Range Status   Specimen Description   Final    PLEURAL Performed at [facility] [address], [place], [place] [address]    Special Requests   Final    NONE Performed at [facility], [place] [address] [place], [place] [address]    Gram Stain   Final    WBC PRESENT, PREDOMINANTLY MONONUCLEAR NO ORGANISMS SEEN CYTOSPIN SMEAR    Culture   Final    NO GROWTH Performed at [facility], [place] [address] [place],  [address]    Report Status 04/29/2021 FINAL  Final         Radiology Studies: US Abdomen Limited RUQ (LIVER/GB)  Result Date: 04/27/2021 CLINICAL DATA:  Elevated liver function test. EXAM: ULTRASOUND ABDOMEN LIMITED RIGHT UPPER QUADRANT COMPARISON:  Head CT 03/13/2021 FINDINGS: Gallbladder: Layering calcified stones within the gallbladder lumen. Avascular non mobile echogenic foci adherent along the gallbladder wall may represent tumefactive sludge. No gallbladder wall thickening or pericholecystic fluid visualized. Right side in no sonographic Murphy sign noted by sonographer. Common bile duct: Diameter: 4 mm. Liver: No focal lesion identified. Within normal limits in parenchymal echogenicity. Portal vein is patent on color Doppler imaging with normal direction of blood flow towards the liver. Other: None. IMPRESSION: Cholelithiasis and possibly tumefactive sludge. No  acute cholecystitis.  Electronically Signed   By: Iven Finn M.D.   On: 04/27/2021 21:27        Scheduled Meds:  aspirin EC  81 mg Oral Daily   Chlorhexidine Gluconate Cloth  6 each Topical Daily   erythromycin   Both Eyes QHS   ipratropium-albuterol  3 mL Nebulization TID   liver oil-zinc oxide   Topical BID   mouth rinse  15 mL Mouth Rinse BID   nicotine  21 mg Transdermal Daily   polyvinyl alcohol  2 drop Both Eyes QID   predniSONE  40 mg Oral Q breakfast   tamsulosin  0.4 mg Oral QPC supper   vitamin B-12  500 mcg Oral QPM   Continuous Infusions:  sodium chloride 10 mL/hr at 04/27/21 0400     LOS: 4 days    Time spent: over 30 min    Fayrene Helper, MD [facility]   To contact the attending provider between 7A-7P or the covering provider during after hours 7P-7A, please log into the web site [email or web address] and access using universal [place] password for that web site. If you do not have the password, please call the hospital operator.  04/29/2021, 7:40 PM   5

## 2021-04-30 ENCOUNTER — Other Ambulatory Visit: Payer: Medicare Other

## 2021-04-30 ENCOUNTER — Encounter: Payer: Self-pay | Admitting: Internal Medicine

## 2021-04-30 LAB — CBC WITH DIFFERENTIAL/PLATELET
Abs Immature Granulocytes: 0.02 10*3/uL (ref 0.00–0.07)
Basophils Absolute: 0 10*3/uL (ref 0.0–0.1)
Basophils Relative: 1 %
Eosinophils Absolute: 0 10*3/uL (ref 0.0–0.5)
Eosinophils Relative: 2 %
HCT: 38 % — ABNORMAL LOW (ref 39.0–52.0)
Hemoglobin: 12.1 g/dL — ABNORMAL LOW (ref 13.0–17.0)
Immature Granulocytes: 1 %
Lymphocytes Relative: 32 %
Lymphs Abs: 0.7 10*3/uL (ref 0.7–4.0)
MCH: 30.9 pg (ref 26.0–34.0)
MCHC: 31.8 g/dL (ref 30.0–36.0)
MCV: 96.9 fL (ref 80.0–100.0)
Monocytes Absolute: 0.4 10*3/uL (ref 0.1–1.0)
Monocytes Relative: 20 %
Neutro Abs: 1 10*3/uL — ABNORMAL LOW (ref 1.7–7.7)
Neutrophils Relative %: 44 %
Platelets: 123 10*3/uL — ABNORMAL LOW (ref 150–400)
RBC: 3.92 MIL/uL — ABNORMAL LOW (ref 4.22–5.81)
RDW: 13.3 % (ref 11.5–15.5)
WBC: 2.2 10*3/uL — ABNORMAL LOW (ref 4.0–10.5)
nRBC: 0 % (ref 0.0–0.2)

## 2021-04-30 LAB — COMPREHENSIVE METABOLIC PANEL
ALT: 228 U/L — ABNORMAL HIGH (ref 0–44)
AST: 106 U/L — ABNORMAL HIGH (ref 15–41)
Albumin: 2.6 g/dL — ABNORMAL LOW (ref 3.5–5.0)
Alkaline Phosphatase: 101 U/L (ref 38–126)
Anion gap: 7 (ref 5–15)
BUN: 23 mg/dL (ref 8–23)
CO2: 29 mmol/L (ref 22–32)
Calcium: 8.9 mg/dL (ref 8.9–10.3)
Chloride: 101 mmol/L (ref 98–111)
Creatinine, Ser: 0.77 mg/dL (ref 0.61–1.24)
GFR, Estimated: >60 mL/min (ref >60)
Glucose, Bld: 98 mg/dL (ref 70–99)
Potassium: 4.4 mmol/L (ref 3.5–5.1)
Sodium: 137 mmol/L (ref 135–145)
Total Bilirubin: 0.2 mg/dL — ABNORMAL LOW (ref 0.3–1.2)
Total Protein: 6.1 g/dL — ABNORMAL LOW (ref 6.5–8.1)

## 2021-04-30 LAB — PHOSPHORUS: Phosphorus: 4 mg/dL (ref 2.5–4.6)

## 2021-04-30 LAB — MAGNESIUM: Magnesium: 2.1 mg/dL (ref 1.7–2.4)

## 2021-04-30 MED ORDER — ERYTHROMYCIN 5 MG/GM OP OINT: TOPICAL_OINTMENT | Freq: Every day | OPHTHALMIC | 0 refills | Status: AC

## 2021-04-30 MED ORDER — NICOTINE 21 MG/24HR TD PT24: 21.0000 mg | MEDICATED_PATCH | Freq: Every day | TRANSDERMAL | 0 refills | Status: DC

## 2021-04-30 MED ORDER — ALBUTEROL SULFATE HFA 108 (90 BASE) MCG/ACT IN AERS: 2.0000 | INHALATION_SPRAY | Freq: Four times a day (QID) | RESPIRATORY_TRACT | 2 refills | Status: DC | PRN

## 2021-04-30 MED ORDER — TAMSULOSIN HCL 0.4 MG PO CAPS: 0.4000 mg | ORAL_CAPSULE | Freq: Every day | ORAL | 1 refills | Status: AC

## 2021-04-30 NOTE — Discharge Summary (Signed)
Physician Discharge Summary  Reznor Ferrando HEN:[id number] DOB: 04-12-1940 DOA: 04/23/2021  PCP: [name], DO  Admit date: 04/23/2021 Discharge date: 04/30/2021  Time spent: 40 minutes  Recommendations for Outpatient Follow-up:  Follow outpatient CBC/CMP Follow oxygen needs outpatient Follow with Dr. Julien Nordmann outpatient for his small cell lung cancer Follow repeat LFT's outpatient - currently holding crestor, fish oil, and acitretin - consider resumption when LFT's improve Follow with urology for urinary retention Follow with ophthalmology  Follow effusion outpatient with Dr. 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  Has been having some runny nose and congestion.  Denies any nausea vomiting diarrhea abdominal pain or chest pain.  CT was concerning for progression of disease with right upper lobe mass enlargement, worsening R hilar and mediastinal LAD, enlarging right pleural effusion, and concern for lymphangitic spread of tumor.  He was treated for presumed pneumonia as well as given steroids for wheezing/COPD exacerbation.  He's gradually improved, but continues to require oxygen at the time of  discharge.  Hospitalization complicated by urinary retention requiring foley catheter placement, will follow outpatient for TOV with urology.  Also complicated by liver injury, suspected this is related to chemotherapy or medications, will need to follow outpatient while holding possibly contributing meds.    See below for additional details   Hospital Course:  Assessment and Plan: * CAP (community acquired pneumonia)- (present on admission) Presented with fever after chemo CT chest 1/25 with progression of disease with enlargement of RUL mass which appears to invade the right chest wall and anterior mediastinum, worsening R hilar and mediastinal LAD, enlarging thick walled, presumably malignant R pleural effusion and interval development of what appears to be lymphangitic spread of tumor throughout R lung IR didn't think chest tube would be indicated S/p thoracentesis today 1/27 Will request thora to eval effusion, r/u infection, discussed with oncology -> exudative, follow gram stain no organisms seen, culture NG, and cytology without malignant cells (limiting inflammation) He's completed Ronold Hardgrove course of abx Wheezing has improved, will d/c steroids Blood cx ng, urine cx ng   Respiratory failure, acute ([place]) Continues to improve, but still desatting with activity Requiring 2 L with activity on the day of discharge - suspect related to his acute illness, emphysema, and suspected pneumonia Follow with Dr. 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 Julien Nordmann outpatient CT chest with  findings concerning for progression of disease -- he's aware, it's very early in treatment, too early to assess for response, he'll follow with them outpatient Will need to follow up after completion of treatment for infection  Centrilobular emphysema ([place])- (present on admission) Now with exertional O2 requirement as above (2 L) Follow with Dr. Valeta Harms outpatient trelegy Will prescribe albuterol  Pressure ulcer, stage 2 ([place])- (present on admission) Continue wound care per wife, follow  Liver function abnormality Elevated LFT's today - fluctuating Normal bili/alk phos.  AST improving, ALT rising on day of discharge  Follow acute hepatitis panel negative Will hold acitretin, fish oil, and crestor - consider resumption in future with improvement in LFT's ? Related to meds or chemotherapy possible, discussed with Dr. Julien Nordmann - at this point, plan to hold possibly contributing meds and follow outpatient Consider RUQ [place] with cholelithiasis and tumefactive sludge  Acute urinary retention Now with indwelling foley Will need to discharge with this and follow with urology outpatient - [facility] will call for appt flomax   AKI (acute kidney injury) ([place]) improved  Blepharitis Nightly erythromycin ointment, follow ophtho follow up  Ectropion Follow with ophtho outpatient Nightly ointment   Procedures: IR thora 1/27  Consultations: IR  Discharge Exam: Vitals:   04/30/21 0849 04/30/21 1454  BP:  106/60  Pulse:  92  Resp:  18  Temp:  97.7 F (36.5 C)  SpO2: 93% 100%   Feeling well No CP or SOB Feels back to baseline Daughter and wife at bedside  General: No acute distress. Cardiovascular: Heart sounds show Nafeesa Dils regular rate, and rhythm. No gallops or rubs.  Lungs: CTAB, wheezing is much improved today (none apparent) Abdomen: Soft, nontender, nondistended Neurological: Alert and oriented 3. Moves all extremities 4. Cranial nerves II through XII grossly intact. Skin:  Warm and dry. No rashes or lesions. Extremities: No clubbing or cyanosis. No edema.  No TTP.  Discharge Instructions   Discharge Instructions     Call MD for:  difficulty breathing, headache or visual disturbances   Complete by: As directed    Call MD for:  extreme fatigue   Complete by: As directed    Call MD for:  hives   Complete by: As directed    Call MD for:  persistant dizziness or light-headedness   Complete by: As directed    Call MD for:  persistant nausea and vomiting   Complete by: As directed    Call MD for:  redness, tenderness, or signs of infection (pain, swelling, redness, odor or green/yellow discharge around incision site)   Complete by: As directed    Call MD for:  severe uncontrolled pain   Complete by: As directed    Call MD for:  temperature >100.4   Complete by: As directed    Diet - low sodium heart healthy   Complete by: As directed    Discharge instructions   Complete by: As directed    You were seen with Rianna Lukes fever and treated for pneumonia.    Your CT scan showed evidence of your cancer as well as fluid on the right.  We sampled this fluid and it did not grow any bacteria.  The cytology was negative, but I suspect the fluid is related to your cancer.    Please follow up with Dr. Julien Nordmann as an outpatient.  Your wheezing has improved with steroids.  We'll send you home with albuterol to  continue to use as needed.  You'll also need to continue to use your trelegy as prescribed.    You continue to require oxygen with activity.  Use 2 liters with activity and sleep.  Please follow up with Dr. Valeta Harms as an outpatient for your emphysema and your new oxygen requirement.   You developed urinary retention.  We'll send you home with Tore Carreker foley catheter.  You'll need to follow up with [facility] outpatient.  They should call you, but if you don't hear from them, please call back.  We started you on flomax.  Your liver functions are elevated.  For now, stop your  crestor, fish oil, and acitretin.  Hold off on any tylenol until you follow up with repeat labs and you know it's trending down.  I think this maybe related to your chemotherapy, but it's important that you follow up repeat labs as scheduled (this Thursday).  These medicines can be resumed when your liver function improves.   Please follow up with ophthalmology as an outpatient.   Return for new, recurrent, or worsening symptoms.  Please ask your PCP to request records from this hospitalization so they know what was done and what the next steps will be.   Discharge wound care:   Complete by: As directed    Continue zinc oxide to buttock wound   Increase activity slowly   Complete by: As directed       Allergies as of 04/30/2021       Reactions   Cosela [trilaciclib] Other (See Comments)   thrombophlebitis        Medication List     STOP taking these medications    acitretin 25 MG capsule Commonly known as: SORIATANE   Fish Oil 1200 MG Caps   rosuvastatin 20 MG tablet Commonly known as: CRESTOR       TAKE these medications    albuterol 108 (90 Base) MCG/ACT inhaler Commonly known as: VENTOLIN HFA Inhale 2 puffs into the lungs every 6 (six) hours as needed for wheezing or shortness of breath.   aspirin EC 81 MG tablet Take 1 tablet (81 mg total) by mouth daily. Swallow whole.   clobetasol cream 0.05 % Commonly known as: TEMOVATE Apply 1 application topically daily as needed (Psoriasis).   colchicine 0.6 MG tablet Take 1.40m initially followed by 0.643min 1 hour.  Continue 0.83m57maily until improvement.   erythromycin ophthalmic ointment Place into both eyes at bedtime for 9 days. Please follow up with ophthalmology   lidocaine-prilocaine cream Commonly known as: EMLA Apply to the [place]-Cath site 30-60-minute before chemotherapy.   niacinamide 500 MG tablet Take 500 mg by mouth daily with breakfast.   nicotine 21 mg/24hr patch Commonly known as:  NICODERM CQ - dosed in mg/24 hours Place 1 patch (21 mg total) onto the skin daily. Start taking on: May 01, 2021   PRESERVISION AREDS 2 PO Take 1 tablet by mouth in the morning and at bedtime.   prochlorperazine 10 MG tablet Commonly known as: COMPAZINE Take 1 tablet (10 mg total) by mouth every 6 (six) hours as needed.   tamsulosin 0.4 MG Caps capsule Commonly known as: FLOMAX Take 1 capsule (0.4 mg total) by mouth daily after supper.   Trelegy Ellipta 100-62.5-25 MCG/ACT Aepb Generic drug: Fluticasone-Umeclidin-Vilant Inhale into the lungs.   triamcinolone cream 0.1 % Commonly known as: KENALOG Apply 1 application topically daily as needed (skin irritation.).   vitamin B-12 500 MCG tablet Commonly known as:  CYANOCOBALAMIN Take 500 mcg by mouth every evening.               Durable Medical Equipment  (From admission, onward)           Start     Ordered   04/30/21 1452  For home use only DME oxygen  Once       Comments: SATURATION QUALIFICATIONS:     Patient Saturations on Room Air at Rest = 94%   Patient Saturations on Room Air while Ambulating = 82%   Patient Saturations on 2 Liters of oxygen while Ambulating = 97%   Please briefly explain why patient needs home oxygen: Desaturation with exertion on room air  Question Answer Comment  Length of Need 12 Months   Mode or (Route) Nasal cannula   Liters per Minute 1   Frequency Continuous (stationary and portable oxygen unit needed)   Oxygen conserving device Yes   Oxygen delivery system Gas      04/30/21 1451   04/30/21 1424  For home use only DME 3 n 1  Once        04/30/21 1423   04/30/21 1423  For home use only DME 4 wheeled rolling walker with seat  Once       Question:  Patient needs Versia Mignogna walker to treat with the following condition  Answer:  Physical deconditioning   04/30/21 1423              Discharge Care Instructions  (From admission, onward)           Start     Ordered    04/30/21 0000  Discharge wound care:       Comments: Continue zinc oxide to buttock wound   04/30/21 1503           Allergies  Allergen Reactions   Cosela [Trilaciclib] Other (See Comments)    thrombophlebitis    Follow-up Information     [facility] Follow up.   Specialty: [place] Why: They will call you to set up Gad Aymond time to see you Contact information: [address] [place] [address] [phone]         [facility] [place] Follow up.   Why: This is you home oxygen company Contact information: [place] [address] [phone]         [facility] Follow up.   Why: Call if you don't hear from anyone within 1 week Contact information: [place]        June Leap L, DO Follow up.   Specialty: Pulmonary Disease Why: Call for follow up regarding your new oxygen requirement Contact information: [place] [address] [phone]         Curt Bears, MD Follow up.   Specialty: Oncology Why: Call for follow up from this hospitalization Contact information: [place] [address] [phone]                  The results of significant diagnostics from this hospitalization (including imaging, microbiology, ancillary and laboratory) are listed below for reference.    Significant Diagnostic Studies: DG Chest 1 View  Result Date: 04/26/2021 CLINICAL DATA:  Post right thoracentesis EXAM: CHEST  1 VIEW COMPARISON:  04/23/2021 FINDINGS: No pneumothorax. Persistent right pleural effusion. Right lung opacities and interstitial thickening as before.  Lung aeration at the right lung base has decreased. Similar cardiomediastinal contours with volume loss in the right chest resulting in mediastinal shift. IMPRESSION: No pneumothorax. Persistent right pleural effusion.  Persistent right pulmonary opacities and interstitial thickening with decreased aeration at the right lung base. Electronically Signed   By: Macy Mis M.D.   On: 04/26/2021 10:35   DG Chest 2 View  Result Date: 04/23/2021 CLINICAL DATA:  Shortness of breath fever EXAM: CHEST - 2 VIEW COMPARISON:  09/27/2019, CT 02/28/2021, PET CT 02/28/2021 FINDINGS: Volume loss in the right thorax. Fiducial markers in the right mid lung. Small loculated right pleural effusion. Worsening airspace disease in the right lung base. Right paratracheal opacity corresponding to adenopathy. Stable cardiac size. Left lung is clear. Emphysema IMPRESSION: 1. Small loculated right pleural effusion with increased airspace disease in the right lung base raising concern for acute pneumonia superimposed on previously noted areas of consolidation and possible recurrent on prior CT. 2. Emphysema.  Cardiomegaly. Electronically Signed   By: Donavan Foil M.D.   On: 04/23/2021 22:22   CT CHEST WO CONTRAST  Result Date: 04/24/2021 CLINICAL DATA:  81 year old male with history of fever and congestion. Abnormal chest x-ray. EXAM: CT CHEST WITHOUT CONTRAST TECHNIQUE: Multidetector CT imaging of the chest was performed following the standard protocol without IV contrast. RADIATION DOSE REDUCTION: This exam was performed according to the departmental dose-optimization program which includes automated exposure control, adjustment of the mA and/or kV according to patient size and/or use of iterative reconstruction technique. COMPARISON:  Chest CT 02/28/2021.  PET-CT 03/13/2021. FINDINGS: Cardiovascular: Heart size is normal. There is no significant pericardial fluid, thickening or pericardial calcification. There is aortic atherosclerosis, as well as atherosclerosis of the great vessels of the mediastinum and the coronary arteries, including calcified atherosclerotic plaque in the left main, left anterior descending, left circumflex and right  coronary arteries. Calcifications of the aortic valve and mitral annulus. Mediastinum/Nodes: Worsening mediastinal lymphadenopathy, most notable for Sonakshi Rolland large right paratracheal nodal mass which measures up to 4.1 x 3.9 cm inferiorly (axial image 53 of series 2), as compared with 3.5 x 3.8 cm on the recent prior study from 02/28/2021. Prominent soft tissue in the right hilar region is also noted on today's examination, but difficult to discern from adjacent vasculature on today's noncontrast CT examination. Right hilar lymphadenopathy is suspected, however. Esophagus is unremarkable in appearance. No axillary lymphadenopathy. Asymmetric heterogeneous enlargement of the right lobe of the thyroid gland which demonstrates internal calcifications, similar to numerous prior studies and not hypermetabolic on remote prior PET-CT 09/12/2019, presumably an asymmetric goiter. Lungs/Pleura: Again noted is Ziyon Soltau mass in the anteromedial aspect of the right upper lobe which appears to invade both the anterior chest wall and the anterior mediastinum (axial image 55 of series 2 and coronal image 62 of series 6), which has enlarged compared to the prior study, currently measuring 5.6 x 3.0 x 3.3 cm (previously 2.4 x 3.9 x 2.3 cm on prior chest CT 02/28/2021). Extensive nodular septal thickening, thickening of the peribronchovascular interstitium and regional architectural distortion is noted throughout the right lung on today's examination, new compared to the prior study, likely to reflect progressive lymphangitic spread of tumor throughout the right lung. Minimal septal thickening is also noted in the left lung. Small thick-walled right-sided pleural effusion slightly larger than the prior study, likely malignant. No left pleural effusion. Upper Abdomen: Aortic atherosclerosis. Tiny calcified gallstones lying dependently in the gallbladder. Numerous colonic diverticulae are noted in the  visualized portions of the distal transverse  colon. Musculoskeletal: There are no aggressive appearing lytic or blastic lesions noted in the visualized portions of the skeleton. IMPRESSION: 1. Today's study demonstrates definitive progression of disease with enlargement of the right upper lobe mass which again appears to invade the right chest wall and anterior mediastinum, worsening right hilar and mediastinal lymphadenopathy, enlarging thick-walled presumably malignant right pleural effusion, and interval development of what appears to be lymphangitic spread of tumor throughout the right lung, as detailed above. 2. Aortic atherosclerosis, in addition to left main and three-vessel coronary artery disease. 3. Cholelithiasis. 4. Colonic diverticulosis. 5. Additional incidental findings, as above. Aortic Atherosclerosis (ICD10-I70.0). Electronically Signed   By: Vinnie Langton M.D.   On: 04/24/2021 07:17   MR BRAIN W WO CONTRAST  Result Date: 04/11/2021 CLINICAL DATA:  Non-small cell lung cancer staging EXAM: MRI HEAD WITHOUT AND WITH CONTRAST TECHNIQUE: Multiplanar, multiecho pulse sequences of the brain and surrounding structures were obtained without and with intravenous contrast. CONTRAST:  22m GADAVIST GADOBUTROL 1 MMOL/ML IV SOLN COMPARISON:  No prior MRI FINDINGS: Brain: No restricted diffusion to suggest acute or subacute infarct. No acute hemorrhage, mass, mass effect, or midline shift. No abnormal enhancement. No hydrocephalus or extra-axial collection. No dural thickening or hyperenhancement. T2 hyperintense signal in the periventricular white matter and pons, likely the sequela of chronic small vessel ischemic disease. No foci of hemosiderin deposition to suggest remote hemorrhage. Vascular: Normal flow voids. Skull and upper cervical spine: Normal marrow signal. Sinuses/Orbits: Negative.  Status post bilateral lens replacements. Other: Fluid in the right-greater-than-left mastoid air cells. IMPRESSION: No acute intracranial process. No  evidence of metastatic disease in the brain. Electronically Signed   By: AMerilyn BabaM.D.   On: 04/11/2021 15:50   [place]Abdomen Limited RUQ (LIVER/GB)  Result Date: 04/27/2021 CLINICAL DATA:  Elevated liver function test. EXAM: ULTRASOUND ABDOMEN LIMITED RIGHT UPPER QUADRANT COMPARISON:  Head CT 03/13/2021 FINDINGS: Gallbladder: Layering calcified stones within the gallbladder lumen. Avascular non mobile echogenic foci adherent along the gallbladder wall may represent tumefactive sludge. No gallbladder wall thickening or pericholecystic fluid visualized. Right side in no sonographic Murphy sign noted by sonographer. Common bile duct: Diameter: 4 mm. Liver: No focal lesion identified. Within normal limits in parenchymal echogenicity. Portal vein is patent on color Doppler imaging with normal direction of blood flow towards the liver. Other: None. IMPRESSION: Cholelithiasis and possibly tumefactive sludge. No acute cholecystitis. Electronically Signed   By: MIven FinnM.D.   On: 04/27/2021 21:27   [place]oreaTHORACENTESIS ASP PLEURAL SPACE W/IMG GUIDE  Result Date: 04/26/2021 INDICATION: Patient with history of small cell lung cancer, pneumonia, COPD, right pleural effusion. Request received for diagnostic and therapeutic right thoracentesis. EXAM: ULTRASOUND GUIDED DIAGNOSTIC AND THERAPEUTIC RIGHT THORACENTESIS MEDICATIONS: 10 mL 1% lidocaine COMPLICATIONS: None immediate. PROCEDURE: An ultrasound guided thoracentesis was thoroughly discussed with the patient and questions answered. The benefits, risks, alternatives and complications were also discussed. The patient understands and wishes to proceed with the procedure. Written consent was obtained. Ultrasound was performed to localize and mark an adequate pocket of fluid in the right chest. The area was then prepped and draped in the normal sterile fashion. 1% Lidocaine was used for local anesthesia. Under ultrasound guidance Mikal Blasdell 6 Fr Safe-T-Centesis catheter was  introduced. Thoracentesis was performed. The catheter was removed and Luverta Korte dressing applied. FINDINGS: Anuar Walgren total of approximately 110 cc of yellow fluid was removed. Samples were sent to the laboratory as requested by the  clinical team. Only Kamalani Mastro small amount of free pleural fluid was noted on today's ultrasound. Despite catheter manipulation only the above amount of fluid could be aspirated today. IMPRESSION: Successful ultrasound guided diagnostic and therapeutic right thoracentesis yielding 110 cc of pleural fluid. Read by: Rowe Robert, PA-C Electronically Signed   By: Jerilynn Mages.  Shick M.D.   On: 04/26/2021 13:01    Microbiology: Recent Results (from the past 240 hour(s))  Resp Panel by RT-PCR (Flu Aracelie Addis&B, Covid) Nasopharyngeal Swab     Status: None   Collection Time: 04/23/21 10:15 PM   Specimen: Nasopharyngeal Swab; Nasopharyngeal(NP) swabs in vial transport medium  Result Value Ref Range Status   SARS Coronavirus 2 by RT PCR NEGATIVE NEGATIVE Final    Comment: (NOTE) SARS-CoV-2 target nucleic acids are NOT DETECTED.  The SARS-CoV-2 RNA is generally detectable in upper respiratory specimens during the acute phase of infection. The lowest concentration of SARS-CoV-2 viral copies this assay can detect is 138 copies/mL. Lateasha Breuer negative result does not preclude SARS-Cov-2 infection and should not be used as the sole basis for treatment or other patient management decisions. Cynitha Berte negative result may occur with  improper specimen collection/handling, submission of specimen other than nasopharyngeal swab, presence of viral mutation(s) within the areas targeted by this assay, and inadequate number of viral copies(<138 copies/mL). Fuller Makin negative result must be combined with clinical observations, patient history, and epidemiological information. The expected result is Negative.  Fact Sheet for Patients:  EntrepreneurPulse.com.au  Fact Sheet for Healthcare Providers:   IncredibleEmployment.be  This test is no t yet approved or cleared by the [place] FDA and  has been authorized for detection and/or diagnosis of SARS-CoV-2 by FDA under an Emergency Use Authorization (EUA). This EUA will remain  in effect (meaning this test can be used) for the duration of the COVID-19 declaration under Section 564(b)(1) of the Act, 21 U.S.C.section 360bbb-3(b)(1), unless the authorization is terminated  or revoked sooner.       Influenza Jayel Inks by PCR NEGATIVE NEGATIVE Final   Influenza B by PCR NEGATIVE NEGATIVE Final    Comment: (NOTE) The Xpert Xpress SARS-CoV-2/FLU/RSV plus assay is intended as an aid in the diagnosis of influenza from Nasopharyngeal swab specimens and should not be used as Takila Kronberg sole basis for treatment. Nasal washings and aspirates are unacceptable for Xpert Xpress SARS-CoV-2/FLU/RSV testing.  Fact Sheet for Patients: EntrepreneurPulse.com.au  Fact Sheet for Healthcare Providers: IncredibleEmployment.be  This test is not yet approved or cleared by the [place] FDA and has been authorized for detection and/or diagnosis of SARS-CoV-2 by FDA under an Emergency Use Authorization (EUA). This EUA will remain in effect (meaning this test can be used) for the duration of the COVID-19 declaration under Section 564(b)(1) of the Act, 21 U.S.C. section 360bbb-3(b)(1), unless the authorization is terminated or revoked.  Performed at [facility], [place] [address], [place], [place] [address]   Blood culture (routine x 2)     Status: None   Collection Time: 04/23/21 10:15 PM   Specimen: BLOOD RIGHT ARM  Result Value Ref Range Status   Specimen Description   Final    BLOOD RIGHT ARM Performed at [facility] [address], [place], [place] [address]    Special Requests   Final    BOTTLES DRAWN AEROBIC AND ANAEROBIC Blood Culture results may not be  optimal due to an inadequate volume of blood received in culture bottles Performed at [facility] [address]  [place], [place] [address]    Culture   Final    NO GROWTH 5 DAYS Performed at [facility], [place] [address], [place], [place] [address]    Report Status 04/29/2021 FINAL  Final  Blood culture (routine x 2)     Status: None   Collection Time: 04/23/21 10:15 PM   Specimen: BLOOD  Result Value Ref Range Status   Specimen Description   Final    BLOOD RIGHT ANTECUBITAL Performed at [facility], [place] [address] [place], [place] [address]    Special Requests   Final    BOTTLES DRAWN AEROBIC AND ANAEROBIC Blood Culture adequate volume Performed at [facility] [address] [place], [place] [address]    Culture   Final    NO GROWTH 5 DAYS Performed at [facility], [place] [address] [place], [place] [address]    Report Status 04/29/2021 FINAL  Final  Urine Culture     Status: None   Collection Time: 04/24/21  3:42 AM   Specimen: In/Out Cath Urine  Result Value Ref Range Status   Specimen Description   Final    IN/OUT CATH URINE Performed at [facility] [address] [place], [place] [address]    Special Requests   Final    NONE Performed at [facility], [place] [address] [place], [place] [address]    Culture   Final    NO GROWTH Performed at [facility], [place] [address], [place], [place] [address]    Report Status 04/24/2021 FINAL  Final  Body fluid culture w Gram Stain     Status: None   Collection Time: 04/26/21 11:15 AM   Specimen: PATH Cytology Pleural fluid  Result Value Ref Range Status   Specimen Description   Final    PLEURAL Performed at [facility] [address], [place], [place] [address]    Special Requests   Final    NONE Performed at [facility], [place] [address], [place], [place] [address]    Gram  Stain   Final    WBC PRESENT, PREDOMINANTLY MONONUCLEAR NO ORGANISMS SEEN CYTOSPIN SMEAR    Culture   Final    NO GROWTH Performed at [facility], [place] [address], [place], [place] [address]    Report Status 04/29/2021 FINAL  Final     Labs: Basic Metabolic Panel: Recent Labs  Lab 04/26/21 0427 04/27/21 0425 04/28/21 0521 04/29/21 0509 04/30/21 0445  NA 137 137 136 138 137  K 4.2 4.6 4.6 4.3 4.4  CL 100 102 100 103 101  CO2 28 27 30 28 29   GLUCOSE 113* 132* 142* 116* 98  BUN 16 19 22 22 23   CREATININE 0.91 0.89 0.78 0.81 0.77  CALCIUM 8.6* 8.7* 8.6* 8.7* 8.9  MG 2.1 1.9 2.1 2.0 2.1  PHOS 3.3 2.9 3.3 3.5 4.0   Liver Function Tests: Recent Labs  Lab 04/26/21 0427 04/27/21 0425 04/28/21 0521 04/29/21 0509 04/30/21 0445  AST 46* 106* 89* 114* 106*  ALT 53* 110* 143* 186* 228*  ALKPHOS 73 109 115 103 101  BILITOT 0.7 0.5 0.4 0.4 0.2*  PROT 6.6 6.1* 5.8* 5.8* 6.1*  ALBUMIN 3.0* 2.8* 2.5* 2.6* 2.6*   No results for input(s): LIPASE, AMYLASE in the last 168 hours. No results for input(s): AMMONIA in the last 168 hours. CBC: Recent Labs  Lab 04/26/21 0427 04/27/21 0425 04/28/21  [id number] 04/29/21 0509 04/30/21 0445  WBC 2.5* 2.1* 2.0* 2.2* 2.2*  NEUTROABS 1.5* 1.2* 1.1* 1.1* 1.0*  HGB 12.8* 12.0* 11.5* 11.9* 12.1*  HCT 40.2 37.8* 35.4* 37.9* 38.0*  MCV 98.8 98.2 96.5 98.7 96.9  PLT 70* 75* 83* 117* 123*   Cardiac Enzymes: No results for input(s): CKTOTAL, CKMB, CKMBINDEX, TROPONINI in the last 168 hours. BNP: BNP (last 3 results) Recent Labs    04/27/21 1507  BNP 99.3    ProBNP (last 3 results) No results for input(s): PROBNP in the last 8760 hours.  CBG: No results for input(s): GLUCAP in the last 168 hours.     Signed:  Fayrene Helper MD.  [facility] 04/30/2021, 3:14 PM

## 2021-04-30 NOTE — Progress Notes (Signed)
AVS given and reviewed with pt, pt's wife, and pt's daughter. Medications discussed. Oxygen tank delivered to bedside. Order for shower chair with arms provided to pt's wife. All questions answered to satisfaction. Pt, pt's wife, and pt's daughter verbalized understanding of information given. Pt escorted off the unit with all belongings via wheelchair by staff member.

## 2021-04-30 NOTE — Progress Notes (Signed)
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   GOAL:   Patient will meet greater than or equal to 90% of their needs   MONITOR:   PO intake, Weight trends  REASON FOR ASSESSMENT:   Diagnosis    ASSESSMENT:    Pt is a 81 y.o. male with PMH significant for lung cancer on chemo, COPD, CAD, tobacco abuse who presented to the ED with fever on 1/24. Pt was admitted to the hospital with PNA concerning for sepsis.   During visit with pt, pt was sitting upright in chair. Pt's wife and daughter present in room during visit. Per pt, pt has a good appetite today and has had a good appetite since admission. Per pt's daughter, pt eats when food is present in front of him. Pt's daughter stated that she was headed down to [place] to bring pt up some food. Per pt, pt ate scrambled eggs, breakfast potatoes, fruit cup and coffee for breakfast today. Per pt, no constipation noted as pt states that he has been using the bathroom regularly. Per pt, no chewing or swallowing difficulties or changes in taste noted.   PTA, pt ambulated on his own, however, pt states that he will be using a walker at home once discharged from the hospital.   Reviewed weight hx. Pt's weight appears stable over the last 4-5 months. Per pt and pt's family, no significant weight changes observed.   Discussed the importance of good po intake with pt and pt's family for recovery and gaining energy/strength back. Per pt, pt takes a MVI at home. Pt's family asked if gatorade was a good beverage for pt to be drinking and encouraged pt to continue drinking gatorade as this provides  carbohydrates and electrolytes.   Medications reviewed and include: prednisone, vitamin b12, desitin, medline mouth rinse   Labs reviewed.    NUTRITION - FOCUSED PHYSICAL EXAM:  Flowsheet Row Most Recent Value  Orbital Region Moderate depletion  Upper Arm Region Mild depletion  Thoracic and Lumbar Region No depletion  Buccal Region Moderate depletion  Temple Region Mild depletion  Clavicle Bone Region No depletion  Clavicle and Acromion Bone Region Mild depletion  Scapular Bone Region Mild depletion  Dorsal Hand No depletion  Patellar Region Mild depletion  Anterior Thigh Region Mild depletion  Posterior Calf Region Mild depletion  Edema (RD Assessment) None  Hair Reviewed  Eyes Reviewed  Mouth Reviewed  Skin Reviewed  Nails Reviewed       Diet Order:   Diet Order             Diet Heart Room service appropriate? Yes; Fluid consistency: Thin; Fluid restriction: 1200 mL Fluid  Diet effective now                   EDUCATION NEEDS:   Education needs have been addressed  Skin:  Skin Assessment: Skin Integrity Issues: Skin Integrity Issues:: Stage I Stage I: R buttocks  Last BM:  1/30  Height:   Ht Readings from Last 1 Encounters:  04/23/21 6' (1.829 m)    Weight:   Wt Readings from Last 1 Encounters:  04/27/21 89.9 kg    Ideal Body Weight:  83.6 kg  BMI:  Body mass index is 26.88 kg/m.  Estimated Nutritional Needs:   Kcal:  2200 - 2400  Protein:  105 - 120 grams  Fluid:  > 2 L    Maryruth Hancock, Dietetic Intern 04/30/2021 2:50 PM

## 2021-04-30 NOTE — Progress Notes (Signed)
Registration staff gave my card per my request on 04/18/21 for any financial questions or concerns.

## 2021-04-30 NOTE — Progress Notes (Signed)
Wife and Daughter provided with extensive re-education on foley catheter and home 02.  Exit care notes provided for both and allowed daughter to video demonstration of how to care for and work 02 and foley catheter.

## 2021-04-30 NOTE — TOC Transition Note (Signed)
Transition of Care [facility]) - CM/SW Discharge Note   Patient Details  Name: Clair Alfieri MRN: [id number] Date of Birth: 1940-10-27  Transition of Care [facility]) CM/SW Contact:  Trish Mage, LCSW Phone Number: 04/30/2021, 9:51 AM   Clinical Narrative:  Patient seen in follow up to PT recommendation of [facility] and need for home O2.  SAT note and orders seen and appreciated.  Mr Thrun plans to return home to [place] with his wife; he has family supports in the area as well. Has all needed DME, with the exception of home O2, or course.  Contacted Caryl Pina with [employer] who will arrange for delivery of home unit and travel cannister.  Contacted Cindie with [facility] with patient's permission, and she confirmed that they will be able to provide [facility] PT and RN [for foley.]. TOC will continue to follow during the course of hospitalization.     Final next level of care: [place] Barriers to Discharge: Barriers Resolved   Patient Goals and CMS Choice Patient states their goals for this hospitalization and ongoing recovery are:: to go home CMS Medicare.gov Compare Post Acute Care list provided to:: Patient Choice offered to / list presented to : Patient  Discharge Placement                       Discharge Plan and Services   Discharge Planning Services: CM Consult                                 Social Determinants of Health (SDOH) Interventions     Readmission Risk Interventions No flowsheet data found.

## 2021-04-30 NOTE — Plan of Care (Signed)
°  Problem: Education: Goal: Knowledge of General Education information will improve Description: Including pain rating scale, medication(s)/side effects and non-pharmacologic comfort measures Outcome: Progressing   Problem: Health Behavior/Discharge Planning: Goal: Ability to manage health-related needs will improve Outcome: Progressing   Problem: Clinical Measurements: Goal: Ability to maintain clinical measurements within normal limits will improve Outcome: Progressing Goal: Respiratory complications will improve Outcome: Progressing Goal: Cardiovascular complication will be avoided Outcome: Progressing   Problem: Activity: Goal: Risk for activity intolerance will decrease Outcome: Progressing   Problem: Nutrition: Goal: Adequate nutrition will be maintained Outcome: Progressing   Problem: Elimination: Goal: Will not experience complications related to urinary retention Outcome: Progressing   Problem: Pain Managment: Goal: General experience of comfort will improve Outcome: Progressing   Problem: Safety: Goal: Ability to remain free from injury will improve Outcome: Progressing   Problem: Skin Integrity: Goal: Risk for impaired skin integrity will decrease Outcome: Progressing

## 2021-04-30 NOTE — Progress Notes (Signed)
SATURATION QUALIFICATIONS:   Patient Saturations on Room Air at Rest = 94%  Patient Saturations on Room Air while Ambulating = 82%  Patient Saturations on 2 Liters of oxygen while Ambulating = 97%  Please briefly explain why patient needs home oxygen: Desaturation with exertion on room air

## 2021-05-02 ENCOUNTER — Telehealth: Payer: Self-pay

## 2021-05-02 ENCOUNTER — Inpatient Hospital Stay: Payer: Medicare Other

## 2021-05-02 NOTE — Telephone Encounter (Signed)
Pt's spouse lvm requesting Urology referral be sent to a location in [place]. They currently have an appt with [facility] but it's not until Feb 14th and her husband has an indwelling catheter.

## 2021-05-03 ENCOUNTER — Telehealth (HOSPITAL_BASED_OUTPATIENT_CLINIC_OR_DEPARTMENT_OTHER): Payer: Self-pay | Admitting: Cardiology

## 2021-05-03 NOTE — Telephone Encounter (Signed)
Pt c/o medication issue:  1. Name of Medication: Rosuvastatin   2. How are you currently taking this medication (dosage and times per day)?   3. Are you having a reaction (difficulty breathing--STAT)? no  4. What is your medication issue?  Calling to say while in the hospital they took patient off the rosuvatatin. Wife callin in to maker sure its okay. Please advise

## 2021-05-03 NOTE — Telephone Encounter (Signed)
Looks like they are holding his Crestor due to poor LFT, would you like me to get him some new labs ordered and sent to him to see when he might be able to restart Crestor?

## 2021-05-05 NOTE — Progress Notes (Signed)
[facility] OFFICE PROGRESS NOTE  [name], DO [place]  [address] [place] [address]  DIAGNOSIS: Extensive stage (T2 a, N2, M1b) small cell lung cancer that was initially diagnosed as synchronous stage Ia non-small cell lung cancer, squamous cell carcinoma involving right upper lobe pulmonary nodule in addition to right lower lobe endobronchial lesion diagnosed in June 2021.  The patient has evidence for disease recurrence and metastasis in December 2022  PRIOR THERAPY: SBRT to the right upper lobe pulmonary nodule as well as the right lower lobe pulmonary nodule under the care of Dr. Sondra Come completed on 11/14/2019.  CURRENT THERAPY:  Palliative systemic chemotherapy with carboplatin for an AUC of 5, etoposide 100 mg/m2, and Imfinzi 1500 mg IV every 3 weeks with Cosela.  First dose expected on 04/16/21. Status post 1 cycle. Dose reduced to carboplatin for an AUC of 4 and etoposide 80 mg/m2 starting from cycle #1.   INTERVAL HISTORY: Gerald [name] 81 y.o. male returns to the clinic today for a follow-up visit accompanied by his wife and daughter.  The patient has a history of squamous cell carcinoma in the right lower lobe.  The patient had a restaging CT scan performed on 02/28/21 which showed suspicious evidence of disease recurrence.  Dr. Julien Nordmann arranged for a PET scan which, unfortunately, showed likely metastatic disease.  The patient was seen by Dr. Valeta Harms for bronchoscopy.  Interestingly, the patient's pathology was consistent with small cell lung cancer in the 4R lymph node. The RLL lavage and biopsy showed atypical cells and dysplastic squamous epithelium.   The patient started systemic chemotherapy for the newly diagnosed small cell lung cancer and his first dose of treatment was started on 04/16/2021.  He presented to the emergency room on 04/23/2021 for fever, runny nose, and nasal congestion.  He was admitted to the hospital until 04/30/2021.  The patient  was treated for pneumonia.  The patient had a restaging scan performed in the hospital which showed disease progression which is expected since his initial scan was on 02/28/2021 and he had just started treatment the week prior to his admission.  He was discharged from the hospital on 2 L of oxygen.  He follows up with pulmonology outpatient for his respiratory failure and right pleural effusion for which she underwent thoracentesis while admitted to the hospital. His breathing has been better since discharge. He no longer uses oxygen during the day. He wears oxygen primarily at night. His oxygen is 96% on room air today.   His hospitalization was complicated by elevated LFTs and urinary retention. His family is concerned about his urinary catheter. His scheduled to see his PCP on Thursday 05/09/21 and his urologist on 05/14/21. His LFTs are improved today and several home medications were discontinued that may have been contributing to elevated LFTs.   During his first chemotherapy infusion, he had some irritation at his IV site with Cosela. He received pepcid and benadryl and his IV site was changed to his other arm. He tolerated the remainder of the Cosela well. He also tolerated Cosela on day 2 and 3 well. He was supposed to have a port-a-cath placed but this was cancelled due to the hospitalization.   Since being discharged, the patient is feeling fairly well.  He denies any more fevers, chills, or night sweats. He does not drink a lot of fluids at home and has been a little hypotensive since discharge. His cough has improved but sometimes still will  have a cough producing yellow/clear phlegm. He does not exert himself frequently so denies significant dyspnea. Denies any chest pain or hemoptysis. Denies any nausea, vomiting, diarrhea, or constipation.  Denies any headache or visual changes.  Denies any rashes or skin changes.  The patient was scheduled to start cycle #2 today.  He is here today for evaluation  and repeat blood work before starting cycle #2.  MEDICAL HISTORY: Past Medical History:  Diagnosis Date   Bradycardia    Bursitis    Carotid artery disease (HCC)    COPD (chronic obstructive pulmonary disease) (HCC)    Coronary artery disease    CTO mid-dist CX (fills from left-to-left collaterals), 99% OM2, 30% ost-dist LM, 30% prox-mid LAD, 30% ost-mid CX, 99% prox RCA (too small for PCI), medical management 02/09/20   Hypercholesterolemia    Ischemic cardiomyopathy    Lung cancer ([facility])    Lung nodule    nscl ca dx'd 08/2019   Psoriasis    Tobacco abuse    Wears glasses    reading    ALLERGIES:  is allergic to cosela [trilaciclib].  MEDICATIONS:  Current Outpatient Medications  Medication Sig Dispense Refill   albuterol (VENTOLIN HFA) 108 (90 Base) MCG/ACT inhaler Inhale 2 puffs into the lungs every 6 (six) hours as needed for wheezing or shortness of breath. 8 g 2   aspirin EC 81 MG tablet Take 1 tablet (81 mg total) by mouth daily. Swallow whole. 90 tablet 3   clobetasol cream (TEMOVATE) 2.35 % Apply 1 application topically daily as needed (Psoriasis).     erythromycin ophthalmic ointment Place into both eyes at bedtime for 9 days. Please follow up with ophthalmology 3.5 g 0   Fluticasone-Umeclidin-Vilant (TRELEGY ELLIPTA) 100-62.5-25 MCG/ACT AEPB Inhale into the lungs.     lidocaine-prilocaine (EMLA) cream Apply to the Port-A-Cath site 30-60-minute before chemotherapy. 30 g 0   Multiple Vitamins-Minerals (PRESERVISION AREDS 2 PO) Take 1 tablet by mouth in the morning and at bedtime.     niacinamide 500 MG tablet Take 500 mg by mouth daily with breakfast.     nicotine (NICODERM CQ - DOSED IN MG/24 HOURS) 21 mg/24hr patch Place 1 patch (21 mg total) onto the skin daily. 28 patch 0   prochlorperazine (COMPAZINE) 10 MG tablet Take 1 tablet (10 mg total) by mouth every 6 (six) hours as needed. 30 tablet 2   tamsulosin (FLOMAX) 0.4 MG CAPS capsule Take 1 capsule (0.4 mg total) by  mouth daily after supper. 30 capsule 1   triamcinolone cream (KENALOG) 0.1 % Apply 1 application topically daily as needed (skin irritation.).     vitamin B-12 (CYANOCOBALAMIN) 500 MCG tablet Take 500 mcg by mouth every evening.     colchicine 0.6 MG tablet Take 1.2mg  initially followed by 0.6mg  in 1 hour.  Continue 0.6mg  daily until improvement. (Patient not taking: Reported on 04/23/2021) 20 tablet 1   No current facility-administered medications for this visit.   Facility-Administered Medications Ordered in Other Visits  Medication Dose Route Frequency Provider Last Rate Last Admin   0.9 %  sodium chloride infusion   Intravenous Once Curt Bears, MD       CARBOplatin (PARAPLATIN) 390 mg in sodium chloride 0.9 % 250 mL chemo infusion  390 mg Intravenous Once Curt Bears, MD       dexamethasone (DECADRON) 10 mg in sodium chloride 0.9 % 50 mL IVPB  10 mg Intravenous Once Curt Bears, MD       durvalumab (  IMFINZI) 1,500 mg in sodium chloride 0.9 % 100 mL chemo infusion  1,500 mg Intravenous Once Curt Bears, MD       etoposide (VEPESID) 170 mg in sodium chloride 0.9 % 500 mL chemo infusion  80 mg/m2 (Treatment Plan Recorded) Intravenous Once Curt Bears, MD       famotidine (PEPCID) IVPB 20 mg premix  20 mg Intravenous Once Curt Bears, MD       fosaprepitant (EMEND) 150 mg in sodium chloride 0.9 % 145 mL IVPB  150 mg Intravenous Once Curt Bears, MD       palonosetron (ALOXI) injection 0.25 mg  0.25 mg Intravenous Once Curt Bears, MD       trilaciclib dihydrochloride (COSELA) 510 mg in dextrose 5 % 500 mL (0.9551 mg/mL) infusion  240 mg/m2 (Treatment Plan Recorded) Intravenous Once Curt Bears, MD        SURGICAL HISTORY:  Past Surgical History:  Procedure Laterality Date   BRONCHIAL BIOPSY  09/27/2019   Procedure: BRONCHIAL BIOPSIES;  Surgeon: Garner Nash, DO;  Location: [facility];  Service: Pulmonary;;   BRONCHIAL BRUSHINGS  09/27/2019    Procedure: BRONCHIAL BRUSHINGS;  Surgeon: Garner Nash, DO;  Location: [facility];  Service: Pulmonary;;   BRONCHIAL NEEDLE ASPIRATION BIOPSY  09/27/2019   Procedure: BRONCHIAL NEEDLE ASPIRATION BIOPSIES;  Surgeon: Garner Nash, DO;  Location: [facility];  Service: Pulmonary;;   BRONCHIAL NEEDLE ASPIRATION BIOPSY  03/29/2021   Procedure: BRONCHIAL NEEDLE ASPIRATION BIOPSIES;  Surgeon: Garner Nash, DO;  Location: [facility];  Service: Pulmonary;;   BRONCHIAL WASHINGS  09/27/2019   Procedure: BRONCHIAL WASHINGS;  Surgeon: Garner Nash, DO;  Location: [facility];  Service: Pulmonary;;   BRONCHIAL WASHINGS  03/29/2021   Procedure: BRONCHIAL WASHINGS;  Surgeon: Garner Nash, DO;  Location: [facility];  Service: Pulmonary;;   COLONOSCOPY     CRYOTHERAPY  09/27/2019   Procedure: Cydney Ok;  Surgeon: Garner Nash, DO;  Location: [facility];  Service: Pulmonary;;   CRYOTHERAPY  03/29/2021   Procedure: CRYOTHERAPY;  Surgeon: Garner Nash, DO;  Location: [facility];  Service: Pulmonary;;   FIDUCIAL MARKER PLACEMENT  09/27/2019   Procedure: FIDUCIAL MARKER PLACEMENT;  Surgeon: Garner Nash, DO;  Location: [facility];  Service: Pulmonary;;   HEMOSTASIS CONTROL  09/27/2019   Procedure: HEMOSTASIS CONTROL;  Surgeon: Garner Nash, DO;  Location: [facility];  Service: Pulmonary;;   HERNIA REPAIR     umbicial- no longer as a "belly button"   RIGHT/LEFT HEART CATH AND CORONARY ANGIOGRAPHY N/A 02/09/2020   Procedure: RIGHT/LEFT HEART CATH AND CORONARY ANGIOGRAPHY;  Surgeon: Burnell [place], MD;  Location: [facility];  Service: Cardiovascular;  Laterality: N/A;   VIDEO BRONCHOSCOPY WITH ENDOBRONCHIAL NAVIGATION N/A 09/27/2019   Procedure: VIDEO BRONCHOSCOPY WITH ENDOBRONCHIAL NAVIGATION;  Surgeon: Garner Nash, DO;  Location: [facility];  Service: Pulmonary;  Laterality: N/A;   VIDEO BRONCHOSCOPY WITH ENDOBRONCHIAL ULTRASOUND N/A  03/29/2021   Procedure: VIDEO BRONCHOSCOPY WITH ENDOBRONCHIAL ULTRASOUND;  Surgeon: Garner Nash, DO;  Location: [facility];  Service: Pulmonary;  Laterality: N/A;    REVIEW OF SYSTEMS:   Review of Systems  Constitutional: Positive for fatigue. Negative for appetite change, chills,  fever and unexpected weight change.  HENT:   Negative for mouth sores, nosebleeds, sore throat and trouble swallowing.   Eyes: Negative for eye problems and icterus.  Respiratory: Positive for mild productive cough. Negative for hemoptysis, shortness of breath and wheezing.  Cardiovascular: Negative for chest pain and leg swelling.  Gastrointestinal: Negative for abdominal pain, constipation, diarrhea, nausea and vomiting.  Genitourinary: Positive for urinary catheter in place. Negative for bladder incontinence, dysuria, frequency and hematuria.   Musculoskeletal: Negative for back pain, gait problem, neck pain and neck stiffness.  Skin: Negative for itching and rash.  Neurological: Negative for dizziness, extremity weakness, gait problem, headaches, light-headedness and seizures.  Hematological: Negative for adenopathy. Does not bruise/bleed easily.  Psychiatric/Behavioral: Negative for confusion, depression and sleep disturbance. The patient is not nervous/anxious.     PHYSICAL EXAMINATION:  Blood pressure (!) 98/56, pulse 77, temperature (!) 97.4 F (36.3 C), temperature source Oral, resp. rate 18, SpO2 96 %.  ECOG PERFORMANCE STATUS: 2  Physical Exam  Constitutional: Oriented to person, place, and time and well-developed, well-nourished, and in no distress.  HENT:  Head: Normocephalic and atraumatic.  Mouth/Throat: Oropharynx is clear and moist. No oropharyngeal exudate.  Eyes: Conjunctivae are normal. Right eye exhibits no discharge. Left eye exhibits no discharge. No scleral icterus.  Neck: Normal range of motion. Neck supple.  Cardiovascular: Normal rate, regular rhythm, normal heart sounds  and intact distal pulses.   Pulmonary/Chest: Effort normal and breath sounds normal. No respiratory distress. No rales. Mild wheezing bilaterally.  Abdominal: Soft. Bowel sounds are normal. Exhibits no distension and no mass. There is no tenderness. Urinary catheter in place.  Musculoskeletal: Normal range of motion. Exhibits no edema.  Lymphadenopathy:    No cervical adenopathy.  Neurological: Alert and oriented to person, place, and time. Exhibits normal muscle tone. Gait normal. Coordination normal.  Skin: Skin is warm and dry. No rash noted. Not diaphoretic. No erythema. No pallor.  Psychiatric: Mood, memory and judgment normal.  Vitals reviewed.  LABORATORY DATA: Lab Results  Component Value Date   WBC 8.6 05/07/2021   HGB 13.3 05/07/2021   HCT 40.7 05/07/2021   MCV 93.8 05/07/2021   PLT 282 05/07/2021      Chemistry      Component Value Date/Time   NA 136 05/07/2021 0950   NA 139 02/07/2020 1208   K 4.1 05/07/2021 0950   CL 101 05/07/2021 0950   CO2 29 05/07/2021 0950   BUN 13 05/07/2021 0950   BUN 17 02/07/2020 1208   CREATININE 0.87 05/07/2021 0950      Component Value Date/Time   CALCIUM 8.9 05/07/2021 0950   ALKPHOS 78 05/07/2021 0950   AST 12 (L) 05/07/2021 0950   ALT 29 05/07/2021 0950   BILITOT 0.4 05/07/2021 0950       RADIOGRAPHIC STUDIES:  DG Chest 1 View  Result Date: 04/26/2021 CLINICAL DATA:  Post right thoracentesis EXAM: CHEST  1 VIEW COMPARISON:  04/23/2021 FINDINGS: No pneumothorax. Persistent right pleural effusion. Right lung opacities and interstitial thickening as before. Lung aeration at the right lung base has decreased. Similar cardiomediastinal contours with volume loss in the right chest resulting in mediastinal shift. IMPRESSION: No pneumothorax. Persistent right pleural effusion. Persistent right pulmonary opacities and interstitial thickening with decreased aeration at the right lung base. Electronically Signed   By: Macy Mis  M.D.   On: 04/26/2021 10:35   DG Chest 2 View  Result Date: 04/23/2021 CLINICAL DATA:  Shortness of breath fever EXAM: CHEST - 2 VIEW COMPARISON:  09/27/2019, CT 02/28/2021, PET CT 02/28/2021 FINDINGS: Volume loss in the right thorax. Fiducial markers in the right mid lung. Small loculated right pleural effusion. Worsening airspace disease in the right lung base. Right paratracheal opacity  corresponding to adenopathy. Stable cardiac size. Left lung is clear. Emphysema IMPRESSION: 1. Small loculated right pleural effusion with increased airspace disease in the right lung base raising concern for acute pneumonia superimposed on previously noted areas of consolidation and possible recurrent on prior CT. 2. Emphysema.  Cardiomegaly. Electronically Signed   By: Donavan Foil M.D.   On: 04/23/2021 22:22   CT CHEST WO CONTRAST  Result Date: 04/24/2021 CLINICAL DATA:  81 year old male with history of fever and congestion. Abnormal chest x-ray. EXAM: CT CHEST WITHOUT CONTRAST TECHNIQUE: Multidetector CT imaging of the chest was performed following the standard protocol without IV contrast. RADIATION DOSE REDUCTION: This exam was performed according to the departmental dose-optimization program which includes automated exposure control, adjustment of the mA and/or kV according to patient size and/or use of iterative reconstruction technique. COMPARISON:  Chest CT 02/28/2021.  PET-CT 03/13/2021. FINDINGS: Cardiovascular: Heart size is normal. There is no significant pericardial fluid, thickening or pericardial calcification. There is aortic atherosclerosis, as well as atherosclerosis of the great vessels of the mediastinum and the coronary arteries, including calcified atherosclerotic plaque in the left main, left anterior descending, left circumflex and right coronary arteries. Calcifications of the aortic valve and mitral annulus. Mediastinum/Nodes: Worsening mediastinal lymphadenopathy, most notable for a large  right paratracheal nodal mass which measures up to 4.1 x 3.9 cm inferiorly (axial image 53 of series 2), as compared with 3.5 x 3.8 cm on the recent prior study from 02/28/2021. Prominent soft tissue in the right hilar region is also noted on today's examination, but difficult to discern from adjacent vasculature on today's noncontrast CT examination. Right hilar lymphadenopathy is suspected, however. Esophagus is unremarkable in appearance. No axillary lymphadenopathy. Asymmetric heterogeneous enlargement of the right lobe of the thyroid gland which demonstrates internal calcifications, similar to numerous prior studies and not hypermetabolic on remote prior PET-CT 09/12/2019, presumably an asymmetric goiter. Lungs/Pleura: Again noted is a mass in the anteromedial aspect of the right upper lobe which appears to invade both the anterior chest wall and the anterior mediastinum (axial image 55 of series 2 and coronal image 62 of series 6), which has enlarged compared to the prior study, currently measuring 5.6 x 3.0 x 3.3 cm (previously 2.4 x 3.9 x 2.3 cm on prior chest CT 02/28/2021). Extensive nodular septal thickening, thickening of the peribronchovascular interstitium and regional architectural distortion is noted throughout the right lung on today's examination, new compared to the prior study, likely to reflect progressive lymphangitic spread of tumor throughout the right lung. Minimal septal thickening is also noted in the left lung. Small thick-walled right-sided pleural effusion slightly larger than the prior study, likely malignant. No left pleural effusion. Upper Abdomen: Aortic atherosclerosis. Tiny calcified gallstones lying dependently in the gallbladder. Numerous colonic diverticulae are noted in the visualized portions of the distal transverse colon. Musculoskeletal: There are no aggressive appearing lytic or blastic lesions noted in the visualized portions of the skeleton. IMPRESSION: 1. Today's study  demonstrates definitive progression of disease with enlargement of the right upper lobe mass which again appears to invade the right chest wall and anterior mediastinum, worsening right hilar and mediastinal lymphadenopathy, enlarging thick-walled presumably malignant right pleural effusion, and interval development of what appears to be lymphangitic spread of tumor throughout the right lung, as detailed above. 2. Aortic atherosclerosis, in addition to left main and three-vessel coronary artery disease. 3. Cholelithiasis. 4. Colonic diverticulosis. 5. Additional incidental findings, as above. Aortic Atherosclerosis (ICD10-I70.0). Electronically Signed   By: Quillian Quince  Entrikin M.D.   On: 04/24/2021 07:17   MR BRAIN W WO CONTRAST  Result Date: 04/11/2021 CLINICAL DATA:  Non-small cell lung cancer staging EXAM: MRI HEAD WITHOUT AND WITH CONTRAST TECHNIQUE: Multiplanar, multiecho pulse sequences of the brain and surrounding structures were obtained without and with intravenous contrast. CONTRAST:  18mL GADAVIST GADOBUTROL 1 MMOL/ML IV SOLN COMPARISON:  No prior MRI FINDINGS: Brain: No restricted diffusion to suggest acute or subacute infarct. No acute hemorrhage, mass, mass effect, or midline shift. No abnormal enhancement. No hydrocephalus or extra-axial collection. No dural thickening or hyperenhancement. T2 hyperintense signal in the periventricular white matter and pons, likely the sequela of chronic small vessel ischemic disease. No foci of hemosiderin deposition to suggest remote hemorrhage. Vascular: Normal flow voids. Skull and upper cervical spine: Normal marrow signal. Sinuses/Orbits: Negative.  Status post bilateral lens replacements. Other: Fluid in the right-greater-than-left mastoid air cells. IMPRESSION: No acute intracranial process. No evidence of metastatic disease in the brain. Electronically Signed   By: Merilyn Baba M.D.   On: 04/11/2021 15:50   US Abdomen Limited RUQ (LIVER/GB)  Result Date:  04/27/2021 CLINICAL DATA:  Elevated liver function test. EXAM: ULTRASOUND ABDOMEN LIMITED RIGHT UPPER QUADRANT COMPARISON:  Head CT 03/13/2021 FINDINGS: Gallbladder: Layering calcified stones within the gallbladder lumen. Avascular non mobile echogenic foci adherent along the gallbladder wall may represent tumefactive sludge. No gallbladder wall thickening or pericholecystic fluid visualized. Right side in no sonographic Murphy sign noted by sonographer. Common bile duct: Diameter: 4 mm. Liver: No focal lesion identified. Within normal limits in parenchymal echogenicity. Portal vein is patent on color Doppler imaging with normal direction of blood flow towards the liver. Other: None. IMPRESSION: Cholelithiasis and possibly tumefactive sludge. No acute cholecystitis. Electronically Signed   By: Iven Finn M.D.   On: 04/27/2021 21:27   US THORACENTESIS ASP PLEURAL SPACE W/IMG GUIDE  Result Date: 04/26/2021 INDICATION: Patient with history of small cell lung cancer, pneumonia, COPD, right pleural effusion. Request received for diagnostic and therapeutic right thoracentesis. EXAM: ULTRASOUND GUIDED DIAGNOSTIC AND THERAPEUTIC RIGHT THORACENTESIS MEDICATIONS: 10 mL 1% lidocaine COMPLICATIONS: None immediate. PROCEDURE: An ultrasound guided thoracentesis was thoroughly discussed with the patient and questions answered. The benefits, risks, alternatives and complications were also discussed. The patient understands and wishes to proceed with the procedure. Written consent was obtained. Ultrasound was performed to localize and mark an adequate pocket of fluid in the right chest. The area was then prepped and draped in the normal sterile fashion. 1% Lidocaine was used for local anesthesia. Under ultrasound guidance a 6 Fr Safe-T-Centesis catheter was introduced. Thoracentesis was performed. The catheter was removed and a dressing applied. FINDINGS: A total of approximately 110 cc of yellow fluid was removed. Samples  were sent to the laboratory as requested by the clinical team. Only a small amount of free pleural fluid was noted on today's ultrasound. Despite catheter manipulation only the above amount of fluid could be aspirated today. IMPRESSION: Successful ultrasound guided diagnostic and therapeutic right thoracentesis yielding 110 cc of pleural fluid. Read by: Rowe Robert, PA-C Electronically Signed   By: Jerilynn Mages.  Shick M.D.   On: 04/26/2021 13:01     ASSESSMENT/PLAN:  This is a very pleasant 81 years old white male recently diagnosed with    1) extensive stage (T2 a, N2, M1 B) small cell lung cancer diagnosed in January 2022. Presented with new masslike area in the medial aspect of the right upper lobe with apparent invasion of the mediastinum and  bulky right paratracheal lymphadenopathy in addition to liver lesions. The patient is currently undergoing systemic chemotherapy with carboplatin for AUC of 5 on day 1, etoposide 100 Mg/M2 on days 1, 2 and 3 with Cosela 240 Mg/M2 before chemotherapy as well as Imfinzi 1500 Mg IV on day 1 every 3 weeks.  Status post 1 cycle.  He was hospitalized following treatment due to pneumonia.   The patient had a restaging CT scan performed while in the hospital which showed some disease progression compared to his initial scan from 02/28/2021.  We expect there to be some disease progression given the interval of time between these 2 scans and considering that he only started treatment 1 week prior to this hospital admission.  Therefore, Dr. Julien Nordmann recommends continuing him on the same treatment and restaging his condition after cycle #3  The patient was seen with Dr. Julien Nordmann today.  The patient has recovered from his recent hospitalization.  The patient's labs look adequate for treatment today.  The patient will proceed with cycle #2 today, however, the patient will reduce the dose of etoposide to 80 mg per metered squared and carboplatin to an AUC of 4 starting from today, cycle  #2.  Due to the patient having irritation from [facility], discussed with pharmacy who will dilute Cosela.  These changes will be reflected in the care plan.  Additionally, he will receive Pepcid as an additional premedication as part of his care plan.  He is scheduled to start cycle #2 the patient was advised to call radiology scheduling to reschedule his Port-A-Cath placement.  Recommended waiting 1 to 2 weeks before having this placed, but still having it placed before his next cycle of treatment  Regarding his urinary catheter, recommended that they follow-up with urology regarding this.  Did review with the patient's wife to always ensure to clean away from the body when cleaning the catheter.   The patient is a little hypotensive today.  Encouraged to increase his oral intake of water at home.  Additionally, we will arrange for him to receive 1 L of fluid over 2 hours while in the infusion room today.  His LFTs have improved. He will continue to discontinue his Crestor, fish oil, and acitretin.   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 Sondra Come. He was advised to call immediately if he has any other concerning symptoms in the interval.  The patient was advised to call immediately if he has any concerning symptoms in the interval. The patient voices understanding of current disease status and treatment options and is in agreement with the current care plan. All questions were answered. The patient knows to call the clinic with any problems, questions or concerns. We can certainly see the patient much sooner if necessary   No orders of the defined types were placed in this encounter.     Rayyan Burley L Amen Dargis, PA-C 05/07/21  ADDENDUM: Hematology/Oncology Attending: I had a  face-to-face encounter with the patient today.  I reviewed his record, lab, recent scans and recommended his care plan.  This is a very pleasant 81 years old white male recently diagnosed with extensive stage (T2 a, N2, M1b) small cell lung cancer in December 2022. The patient is started systemic chemotherapy with carboplatin, etoposide, Cosela and Imfinzi 3 weeks ago and he  tolerated the first cycle fairly well.  He was admitted in the interval to the hospital with pneumonia and elevated liver enzymes that completely normalized at this point. The patient is feeling fine and he has acceptable blood count to proceed with cycle #2. He is scheduled to have Port-A-Cath before the next cycle of his treatment. His last scan during hospitalization showed evidence for disease progression but this was few days after starting the first dose of his treatment and does not reflect his response to the treatment. I recommended for the patient to proceed with his treatment today as planned but I will adjust the dose of carboplatin to AUC of 4 on day 1 and 2 etoposide 80 Mg/M2 on days 1, 2 and 3. The patient will come back for follow-up visit in 3 weeks for evaluation before the next cycle of his treatment.  He will have repeat CT scan of the chest, abdomen and pelvis after the next cycle of his treatment. He was advised to call immediately if he has any concerning symptoms in the interval.

## 2021-05-07 ENCOUNTER — Encounter: Payer: Self-pay | Admitting: Physician Assistant

## 2021-05-07 ENCOUNTER — Other Ambulatory Visit: Payer: Self-pay

## 2021-05-07 ENCOUNTER — Inpatient Hospital Stay: Payer: Medicare Other

## 2021-05-07 ENCOUNTER — Inpatient Hospital Stay: Payer: Medicare Other | Attending: Internal Medicine

## 2021-05-07 ENCOUNTER — Inpatient Hospital Stay (HOSPITAL_BASED_OUTPATIENT_CLINIC_OR_DEPARTMENT_OTHER): Payer: Medicare Other | Admitting: Physician Assistant

## 2021-05-07 VITALS — BP 98/56 | HR 77 | Temp 97.4°F | Resp 18

## 2021-05-07 DIAGNOSIS — C3411 Malignant neoplasm of upper lobe, right bronchus or lung: Secondary | ICD-10-CM | POA: Diagnosis present

## 2021-05-07 DIAGNOSIS — R5383 Other fatigue: Secondary | ICD-10-CM

## 2021-05-07 DIAGNOSIS — Z5111 Encounter for antineoplastic chemotherapy: Secondary | ICD-10-CM

## 2021-05-07 DIAGNOSIS — I959 Hypotension, unspecified: Secondary | ICD-10-CM

## 2021-05-07 DIAGNOSIS — Z5112 Encounter for antineoplastic immunotherapy: Secondary | ICD-10-CM | POA: Insufficient documentation

## 2021-05-07 DIAGNOSIS — C3490 Malignant neoplasm of unspecified part of unspecified bronchus or lung: Secondary | ICD-10-CM

## 2021-05-07 DIAGNOSIS — Z79899 Other long term (current) drug therapy: Secondary | ICD-10-CM | POA: Diagnosis not present

## 2021-05-07 LAB — CMP (CANCER CENTER ONLY)
ALT: 29 U/L (ref 0–44)
AST: 12 U/L — ABNORMAL LOW (ref 15–41)
Albumin: 3.5 g/dL (ref 3.5–5.0)
Alkaline Phosphatase: 78 U/L (ref 38–126)
Anion gap: 6 (ref 5–15)
BUN: 13 mg/dL (ref 8–23)
CO2: 29 mmol/L (ref 22–32)
Calcium: 8.9 mg/dL (ref 8.9–10.3)
Chloride: 101 mmol/L (ref 98–111)
Creatinine: 0.87 mg/dL (ref 0.61–1.24)
GFR, Estimated: >60 mL/min (ref >60)
Glucose, Bld: 100 mg/dL — ABNORMAL HIGH (ref 70–99)
Potassium: 4.1 mmol/L (ref 3.5–5.1)
Sodium: 136 mmol/L (ref 135–145)
Total Bilirubin: 0.4 mg/dL (ref 0.3–1.2)
Total Protein: 6.4 g/dL — ABNORMAL LOW (ref 6.5–8.1)

## 2021-05-07 LAB — CBC WITH DIFFERENTIAL (CANCER CENTER ONLY)
Abs Immature Granulocytes: 0.32 K/uL — ABNORMAL HIGH (ref 0.00–0.07)
Basophils Absolute: 0.1 K/uL (ref 0.0–0.1)
Basophils Relative: 1 %
Eosinophils Absolute: 0.1 K/uL (ref 0.0–0.5)
Eosinophils Relative: 1 %
HCT: 40.7 % (ref 39.0–52.0)
Hemoglobin: 13.3 g/dL (ref 13.0–17.0)
Immature Granulocytes: 4 %
Lymphocytes Relative: 12 %
Lymphs Abs: 1 K/uL (ref 0.7–4.0)
MCH: 30.6 pg (ref 26.0–34.0)
MCHC: 32.7 g/dL (ref 30.0–36.0)
MCV: 93.8 fL (ref 80.0–100.0)
Monocytes Absolute: 1 K/uL (ref 0.1–1.0)
Monocytes Relative: 12 %
Neutro Abs: 6.1 K/uL (ref 1.7–7.7)
Neutrophils Relative %: 70 %
Platelet Count: 282 K/uL (ref 150–400)
RBC: 4.34 MIL/uL (ref 4.22–5.81)
RDW: 13.7 % (ref 11.5–15.5)
WBC Count: 8.6 K/uL (ref 4.0–10.5)
nRBC: 0 % (ref 0.0–0.2)

## 2021-05-07 LAB — TSH: TSH: 1.78 u[IU]/mL (ref 0.320–4.118)

## 2021-05-07 MED ORDER — SODIUM CHLORIDE 0.9 % IV SOLN: Freq: Once | INTRAVENOUS | Status: AC

## 2021-05-07 MED ORDER — SODIUM CHLORIDE 0.9 % IV SOLN
386.8000 mg | Freq: Once | INTRAVENOUS | Status: AC
  Administered 2021-05-07: 390 mg via INTRAVENOUS
  Filled 2021-05-07: qty 39

## 2021-05-07 MED ORDER — PALONOSETRON HCL INJECTION 0.25 MG/5ML
0.2500 mg | Freq: Once | INTRAVENOUS | Status: AC
  Administered 2021-05-07: 0.25 mg via INTRAVENOUS
  Filled 2021-05-07: qty 5

## 2021-05-07 MED ORDER — TRILACICLIB DIHYDROCHLORIDE INJECTION 300 MG
240.0000 mg/m2 | Freq: Once | INTRAVENOUS | Status: AC
  Administered 2021-05-07: 510 mg via INTRAVENOUS
  Filled 2021-05-07: qty 34

## 2021-05-07 MED ORDER — SODIUM CHLORIDE 0.9 % IV SOLN
150.0000 mg | Freq: Once | INTRAVENOUS | Status: AC
  Administered 2021-05-07: 150 mg via INTRAVENOUS
  Filled 2021-05-07: qty 150

## 2021-05-07 MED ORDER — SODIUM CHLORIDE 0.9 % IV SOLN
10.0000 mg | Freq: Once | INTRAVENOUS | Status: AC
  Administered 2021-05-07: 10 mg via INTRAVENOUS
  Filled 2021-05-07: qty 10

## 2021-05-07 MED ORDER — FAMOTIDINE IN NACL 20-0.9 MG/50ML-% IV SOLN
20.0000 mg | Freq: Once | INTRAVENOUS | Status: AC
  Administered 2021-05-07: 20 mg via INTRAVENOUS
  Filled 2021-05-07: qty 50

## 2021-05-07 MED ORDER — SODIUM CHLORIDE 0.9 % IV SOLN
1500.0000 mg | Freq: Once | INTRAVENOUS | Status: AC
  Administered 2021-05-07: 1500 mg via INTRAVENOUS
  Filled 2021-05-07: qty 30

## 2021-05-07 MED ORDER — SODIUM CHLORIDE 0.9 % IV SOLN
80.0000 mg/m2 | Freq: Once | INTRAVENOUS | Status: AC
  Administered 2021-05-07: 170 mg via INTRAVENOUS
  Filled 2021-05-07: qty 8.5

## 2021-05-07 NOTE — Patient Instructions (Signed)
[facility]COLOG[facility]   Discharge Instructions: Thank you for choosing [facility] to provide your oncology and hematology care.   If you have a lab appointment with the [facility], please go directly to the [facility] and check in at the registration area.   Wear comfortable clothing and clothing appropriate for easy access to any Portacath or PICC line.   We strive to give you quality time with your provider. You may need to reschedule your appointment if you arrive late (15 or more minutes).  Arriving late affects you and other patients whose appointments are after yours.  Also, if you miss three or more appointments without notifying the office, you may be dismissed from the clinic at the providers discretion.      For prescription refill requests, have your pharmacy contact our office and allow 72 hours for refills to be completed.    Today you received the following chemotherapy and/or immunotherapy agents: Cosea, durvalumab, carboplatin, etoposide      To help prevent nausea and vomiting after your treatment, we encourage you to take your nausea medication as directed.  BELOW ARE SYMPTOMS THAT SHOULD BE REPORTED IMMEDIATELY: *FEVER GREATER THAN 100.4 F (38 C) OR HIGHER *CHILLS OR SWEATING *NAUSEA AND VOMITING THAT IS NOT CONTROLLED WITH YOUR NAUSEA MEDICATION *UNUSUAL SHORTNESS OF BREATH *UNUSUAL BRUISING OR BLEEDING *URINARY PROBLEMS (pain or burning when urinating, or frequent urination) *BOWEL PROBLEMS (unusual diarrhea, constipation, pain near the anus) TENDERNESS IN MOUTH AND THROAT WITH OR WITHOUT PRESENCE OF ULCERS (sore throat, sores in mouth, or a toothache) UNUSUAL RASH, SWELLING OR PAIN  UNUSUAL VAGINAL DISCHARGE OR ITCHING   Items with * indicate a potential emergency and should be followed up as soon as possible or go to the Emergency Department if any problems should occur.  Please show the CHEMOTHERAPY ALERT CARD or  IMMUNOTHERAPY ALERT CARD at check-in to the Emergency Department and triage nurse.  Should you have questions after your visit or need to cancel or reschedule your appointment, please contact [facility]  Dept: [phone]  and follow the prompts.  Office hours are 8:00 a.m. to 4:30 p.m. Monday - Friday. Please note that voicemails left after 4:00 p.m. may not be returned until the following business day.  We are closed weekends and major holidays. You have access to a nurse at all times for urgent questions. Please call the main number to the clinic Dept: [phone] and follow the prompts.   For any non-urgent questions, you may also contact your provider using MyChart. We now offer e-Visits for anyone 41 and older to request care online for non-urgent symptoms. For details visit [email or web address].   Also download the MyChart app! Go to the app store, search "MyChart", open the app, select [facility], and log in with your MyChart username and password.  Due to Covid, a mask is required upon entering the hospital/clinic. If you do not have a mask, one will be given to you upon arrival. For doctor visits, patients may have 1 support person aged 12 or older with them. For treatment visits, patients cannot have anyone with them due to current Covid guidelines and our immunocompromised population.

## 2021-05-08 ENCOUNTER — Other Ambulatory Visit: Payer: Self-pay

## 2021-05-08 ENCOUNTER — Encounter: Payer: Self-pay | Admitting: Internal Medicine

## 2021-05-08 ENCOUNTER — Inpatient Hospital Stay: Payer: Medicare Other

## 2021-05-08 ENCOUNTER — Telehealth: Payer: Self-pay | Admitting: Internal Medicine

## 2021-05-08 VITALS — BP 116/55 | HR 83 | Temp 97.7°F | Resp 17

## 2021-05-08 DIAGNOSIS — Z5111 Encounter for antineoplastic chemotherapy: Secondary | ICD-10-CM | POA: Diagnosis not present

## 2021-05-08 DIAGNOSIS — C3411 Malignant neoplasm of upper lobe, right bronchus or lung: Secondary | ICD-10-CM

## 2021-05-08 MED ORDER — FAMOTIDINE IN NACL 20-0.9 MG/50ML-% IV SOLN
20.0000 mg | Freq: Once | INTRAVENOUS | Status: AC
  Administered 2021-05-08: 20 mg via INTRAVENOUS
  Filled 2021-05-08: qty 50

## 2021-05-08 MED ORDER — TRILACICLIB DIHYDROCHLORIDE INJECTION 300 MG
240.0000 mg/m2 | Freq: Once | INTRAVENOUS | Status: AC
  Administered 2021-05-08: 510 mg via INTRAVENOUS
  Filled 2021-05-08: qty 34

## 2021-05-08 MED ORDER — SODIUM CHLORIDE 0.9 % IV SOLN: Freq: Once | INTRAVENOUS | Status: AC

## 2021-05-08 MED ORDER — SODIUM CHLORIDE 0.9 % IV SOLN
80.0000 mg/m2 | Freq: Once | INTRAVENOUS | Status: AC
  Administered 2021-05-08: 170 mg via INTRAVENOUS
  Filled 2021-05-08: qty 8.5

## 2021-05-08 MED ORDER — SODIUM CHLORIDE 0.9 % IV SOLN
10.0000 mg | Freq: Once | INTRAVENOUS | Status: AC
  Administered 2021-05-08: 10 mg via INTRAVENOUS
  Filled 2021-05-08: qty 10

## 2021-05-08 MED FILL — Dexamethasone Sodium Phosphate Inj 100 MG/10ML: INTRAMUSCULAR | Qty: 1 | Status: CN

## 2021-05-08 NOTE — Addendum Note (Signed)
Addended by: Tora Kindred on: 05/08/2021 11:50 AM   Modules accepted: Orders

## 2021-05-08 NOTE — Telephone Encounter (Signed)
R/S 2/21 appt  per 2/7 inbasket, pt wife is aware

## 2021-05-08 NOTE — Progress Notes (Signed)
After starting cosela, patient began to c/o burning at IV site.  RN assessed site and noticed it was red.  RN paused cosela and checked for blood return - blood return noted.  RN applied heat to area to relieve burning/redness and contacted pharmacy.  Per [place], slow rate to attempt to relieve redness and burning.  RN decreased infusion rate to run over 45 minutes.  Redness and burning subsided.  Continued infusion until completion.  No other s/s or c/o burning/redness.

## 2021-05-08 NOTE — Addendum Note (Signed)
Addended by: Tora Kindred on: 05/08/2021 11:22 AM   Modules accepted: Orders

## 2021-05-08 NOTE — Patient Instructions (Signed)
[facility]COL[facility]  Discharge Instructions: Thank you for choosing [facility] to provide your oncology and hematology care.   If you have a lab appointment with the [facility], please go directly to the [facility] and check in at the registration area.   Wear comfortable clothing and clothing appropriate for easy access to any Portacath or PICC line.   We strive to give you quality time with your provider. You may need to reschedule your appointment if you arrive late (15 or more minutes).  Arriving late affects you and other patients whose appointments are after yours.  Also, if you miss three or more appointments without notifying the office, you may be dismissed from the clinic at the providers discretion.      For prescription refill requests, have your pharmacy contact our office and allow 72 hours for refills to be completed.    Today you received the following chemotherapy and/or immunotherapy agents: Etoposide      To help prevent nausea and vomiting after your treatment, we encourage you to take your nausea medication as directed.  BELOW ARE SYMPTOMS THAT SHOULD BE REPORTED IMMEDIATELY: *FEVER GREATER THAN 100.4 F (38 C) OR HIGHER *CHILLS OR SWEATING *NAUSEA AND VOMITING THAT IS NOT CONTROLLED WITH YOUR NAUSEA MEDICATION *UNUSUAL SHORTNESS OF BREATH *UNUSUAL BRUISING OR BLEEDING *URINARY PROBLEMS (pain or burning when urinating, or frequent urination) *BOWEL PROBLEMS (unusual diarrhea, constipation, pain near the anus) TENDERNESS IN MOUTH AND THROAT WITH OR WITHOUT PRESENCE OF ULCERS (sore throat, sores in mouth, or a toothache) UNUSUAL RASH, SWELLING OR PAIN  UNUSUAL VAGINAL DISCHARGE OR ITCHING   Items with * indicate a potential emergency and should be followed up as soon as possible or go to the Emergency Department if any problems should occur.  Please show the CHEMOTHERAPY ALERT CARD or IMMUNOTHERAPY ALERT CARD at check-in to  the Emergency Department and triage nurse.  Should you have questions after your visit or need to cancel or reschedule your appointment, please contact [facility]  Dept: [phone]  and follow the prompts.  Office hours are 8:00 a.m. to 4:30 p.m. Monday - Friday. Please note that voicemails left after 4:00 p.m. may not be returned until the following business day.  We are closed weekends and major holidays. You have access to a nurse at all times for urgent questions. Please call the main number to the clinic Dept: [phone] and follow the prompts.   For any non-urgent questions, you may also contact your provider using MyChart. We now offer e-Visits for anyone 19 and older to request care online for non-urgent symptoms. For details visit [email or web address].   Also download the MyChart app! Go to the app store, search "MyChart", open the app, select [facility], and log in with your MyChart username and password.  Due to Covid, a mask is required upon entering the hospital/clinic. If you do not have a mask, one will be given to you upon arrival. For doctor visits, patients may have 1 support person aged 72 or older with them. For treatment visits, patients cannot have anyone with them due to current Covid guidelines and our immunocompromised population.

## 2021-05-09 ENCOUNTER — Inpatient Hospital Stay: Payer: Medicare Other

## 2021-05-09 ENCOUNTER — Encounter: Payer: Self-pay | Admitting: Family Medicine

## 2021-05-09 ENCOUNTER — Ambulatory Visit (INDEPENDENT_AMBULATORY_CARE_PROVIDER_SITE_OTHER): Payer: Medicare Other | Admitting: Family Medicine

## 2021-05-09 VITALS — BP 128/59 | HR 70 | Temp 97.7°F | Resp 20

## 2021-05-09 DIAGNOSIS — R338 Other retention of urine: Secondary | ICD-10-CM | POA: Diagnosis not present

## 2021-05-09 DIAGNOSIS — J189 Pneumonia, unspecified organism: Secondary | ICD-10-CM

## 2021-05-09 DIAGNOSIS — Z720 Tobacco use: Secondary | ICD-10-CM | POA: Diagnosis not present

## 2021-05-09 DIAGNOSIS — L89302 Pressure ulcer of unspecified buttock, stage 2: Secondary | ICD-10-CM | POA: Diagnosis not present

## 2021-05-09 DIAGNOSIS — N179 Acute kidney failure, unspecified: Secondary | ICD-10-CM

## 2021-05-09 DIAGNOSIS — R7989 Other specified abnormal findings of blood chemistry: Secondary | ICD-10-CM

## 2021-05-09 DIAGNOSIS — C3411 Malignant neoplasm of upper lobe, right bronchus or lung: Secondary | ICD-10-CM

## 2021-05-09 DIAGNOSIS — Z5111 Encounter for antineoplastic chemotherapy: Secondary | ICD-10-CM | POA: Diagnosis not present

## 2021-05-09 MED ORDER — TRILACICLIB DIHYDROCHLORIDE INJECTION 300 MG
240.0000 mg/m2 | Freq: Once | INTRAVENOUS | Status: AC
  Administered 2021-05-09: 510 mg via INTRAVENOUS
  Filled 2021-05-09: qty 34

## 2021-05-09 MED ORDER — FAMOTIDINE IN NACL 20-0.9 MG/50ML-% IV SOLN
20.0000 mg | Freq: Once | INTRAVENOUS | Status: AC
  Administered 2021-05-09: 20 mg via INTRAVENOUS
  Filled 2021-05-09: qty 50

## 2021-05-09 MED ORDER — SODIUM CHLORIDE 0.9 % IV SOLN
10.0000 mg | Freq: Once | INTRAVENOUS | Status: AC
  Administered 2021-05-09: 10 mg via INTRAVENOUS
  Filled 2021-05-09: qty 10

## 2021-05-09 MED ORDER — SODIUM CHLORIDE 0.9 % IV SOLN
80.0000 mg/m2 | Freq: Once | INTRAVENOUS | Status: AC
  Administered 2021-05-09: 170 mg via INTRAVENOUS
  Filled 2021-05-09: qty 8.5

## 2021-05-09 MED ORDER — SODIUM CHLORIDE 0.9 % IV SOLN: Freq: Once | INTRAVENOUS | Status: AC

## 2021-05-09 NOTE — Assessment & Plan Note (Signed)
Renal function has improved at this point.

## 2021-05-09 NOTE — Assessment & Plan Note (Addendum)
Completed course of antibiotics.  He is no longer using oxygen.  Still has some fatigue but denies dyspnea.

## 2021-05-09 NOTE — Progress Notes (Signed)
Infuse Cosela over 45 minutes due to IV site burning on 05/08/21.  Henreitta Leber, PharmD

## 2021-05-09 NOTE — Assessment & Plan Note (Signed)
Still has indwelling Foley catheter.  Scheduled to see urology next week.

## 2021-05-09 NOTE — Assessment & Plan Note (Signed)
He has been able to remain quit from smoking since discharge from the hospital with use of nicotine patch.  Encouraged to remain quit.

## 2021-05-09 NOTE — Patient Instructions (Signed)
[facility]COL[facility]  Discharge Instructions: Thank you for choosing [facility] to provide your oncology and hematology care.   If you have a lab appointment with the [facility], please go directly to the [facility] and check in at the registration area.   Wear comfortable clothing and clothing appropriate for easy access to any Portacath or PICC line.   We strive to give you quality time with your provider. You may need to reschedule your appointment if you arrive late (15 or more minutes).  Arriving late affects you and other patients whose appointments are after yours.  Also, if you miss three or more appointments without notifying the office, you may be dismissed from the clinic at the providers discretion.      For prescription refill requests, have your pharmacy contact our office and allow 72 hours for refills to be completed.    Today you received the following chemotherapy and/or immunotherapy agents: Etoposide.      To help prevent nausea and vomiting after your treatment, we encourage you to take your nausea medication as directed.  BELOW ARE SYMPTOMS THAT SHOULD BE REPORTED IMMEDIATELY: *FEVER GREATER THAN 100.4 F (38 C) OR HIGHER *CHILLS OR SWEATING *NAUSEA AND VOMITING THAT IS NOT CONTROLLED WITH YOUR NAUSEA MEDICATION *UNUSUAL SHORTNESS OF BREATH *UNUSUAL BRUISING OR BLEEDING *URINARY PROBLEMS (pain or burning when urinating, or frequent urination) *BOWEL PROBLEMS (unusual diarrhea, constipation, pain near the anus) TENDERNESS IN MOUTH AND THROAT WITH OR WITHOUT PRESENCE OF ULCERS (sore throat, sores in mouth, or a toothache) UNUSUAL RASH, SWELLING OR PAIN  UNUSUAL VAGINAL DISCHARGE OR ITCHING   Items with * indicate a potential emergency and should be followed up as soon as possible or go to the Emergency Department if any problems should occur.  Please show the CHEMOTHERAPY ALERT CARD or IMMUNOTHERAPY ALERT CARD at check-in to  the Emergency Department and triage nurse.  Should you have questions after your visit or need to cancel or reschedule your appointment, please contact [facility]  Dept: [phone]  and follow the prompts.  Office hours are 8:00 a.m. to 4:30 p.m. Monday - Friday. Please note that voicemails left after 4:00 p.m. may not be returned until the following business day.  We are closed weekends and major holidays. You have access to a nurse at all times for urgent questions. Please call the main number to the clinic Dept: [phone] and follow the prompts.   For any non-urgent questions, you may also contact your provider using MyChart. We now offer e-Visits for anyone 22 and older to request care online for non-urgent symptoms. For details visit [email or web address].   Also download the MyChart app! Go to the app store, search "MyChart", open the app, select [facility], and log in with your MyChart username and password.  Due to Covid, a mask is required upon entering the hospital/clinic. If you do not have a mask, one will be given to you upon arrival. For doctor visits, patients may have 1 support person aged 70 or older with them. For treatment visits, patients cannot have anyone with them due to current Covid guidelines and our immunocompromised population.

## 2021-05-09 NOTE — Assessment & Plan Note (Signed)
Still has some tenderness over these areas however skin appears to be healing well.

## 2021-05-09 NOTE — Patient Instructions (Addendum)
Liver enzymes have improved.  You may resume crestor and fish oil.  See me again in about 6 weeks.

## 2021-05-09 NOTE — Assessment & Plan Note (Signed)
He will continue management per oncology.

## 2021-05-09 NOTE — Assessment & Plan Note (Signed)
Transaminitis has resolved.  He can go ahead and restart rosuvastatin.

## 2021-05-09 NOTE — Progress Notes (Signed)
Gerald [name] - 81 y.o. male MRN [id number]  Date of birth: 1941/01/30  Subjective Chief Complaint  Patient presents with   Hospitalization Follow-up    HPI Gerald [name] is an 81 year old male here today for hospital follow-up.  Recently in the hospital after presenting to the ED with fever after chemotherapy.  CT of his chest that showed progression of lung cancer with enlargement of right upper lobe mass and development of right pleural effusion.  He is also diagnosed with clearly acquired pneumonia.  He was treated with course of antibiotics as well as steroids.  He did have thoracentesis for therapeutic and diagnostic purposes.  He did require oxygen during his hospitalization as well as with activity at time of discharge.  His dyspnea has improved at this point and he is rarely using oxygen at home.    He did have elevations in his liver enzymes as well.  Several medications were held in the hospital.  He did have recent follow-up with his oncologist and liver function has returned back to normal.  He does continue to have indwelling Foley due to urinary retention.  He has follow-up with urology next week.  ROS:  A comprehensive ROS was completed and negative except as noted per HPI  Allergies  Allergen Reactions   Cosela [Trilaciclib] Other (See Comments)    thrombophlebitis    Past Medical History:  Diagnosis Date   Bradycardia    Bursitis    Carotid artery disease (HCC)    COPD (chronic obstructive pulmonary disease) (HCC)    Coronary artery disease    CTO mid-dist CX (fills from left-to-left collaterals), 99% OM2, 30% ost-dist LM, 30% prox-mid LAD, 30% ost-mid CX, 99% prox RCA (too small for PCI), medical management 02/09/20   Hypercholesterolemia    Ischemic cardiomyopathy    Lung cancer ([facility])    Lung nodule    nscl ca dx'd 08/2019   Psoriasis    Tobacco abuse    Wears glasses    reading    Past Surgical History:  Procedure Laterality Date   BRONCHIAL BIOPSY  09/27/2019    Procedure: BRONCHIAL BIOPSIES;  Surgeon: Garner Nash, DO;  Location: [facility];  Service: Pulmonary;;   BRONCHIAL BRUSHINGS  09/27/2019   Procedure: BRONCHIAL BRUSHINGS;  Surgeon: Garner Nash, DO;  Location: [facility];  Service: Pulmonary;;   BRONCHIAL NEEDLE ASPIRATION BIOPSY  09/27/2019   Procedure: BRONCHIAL NEEDLE ASPIRATION BIOPSIES;  Surgeon: Garner Nash, DO;  Location: [facility];  Service: Pulmonary;;   BRONCHIAL NEEDLE ASPIRATION BIOPSY  03/29/2021   Procedure: BRONCHIAL NEEDLE ASPIRATION BIOPSIES;  Surgeon: Garner Nash, DO;  Location: [facility];  Service: Pulmonary;;   BRONCHIAL WASHINGS  09/27/2019   Procedure: BRONCHIAL WASHINGS;  Surgeon: Garner Nash, DO;  Location: [facility];  Service: Pulmonary;;   BRONCHIAL WASHINGS  03/29/2021   Procedure: BRONCHIAL WASHINGS;  Surgeon: Garner Nash, DO;  Location: [facility];  Service: Pulmonary;;   COLONOSCOPY     CRYOTHERAPY  09/27/2019   Procedure: Cydney Ok;  Surgeon: Garner Nash, DO;  Location: [facility];  Service: Pulmonary;;   CRYOTHERAPY  03/29/2021   Procedure: CRYOTHERAPY;  Surgeon: Garner Nash, DO;  Location: [facility];  Service: Pulmonary;;   FIDUCIAL MARKER PLACEMENT  09/27/2019   Procedure: FIDUCIAL MARKER PLACEMENT;  Surgeon: Garner Nash, DO;  Location: [facility];  Service: Pulmonary;;   HEMOSTASIS CONTROL  09/27/2019   Procedure: HEMOSTASIS CONTROL;  Surgeon: Garner Nash, DO;  [facility];  Service: Pulmonary;;   HERNIA REPAIR     umbicial- no longer as a "belly button"   RIGHT/LEFT HEART CATH AND CORONARY ANGIOGRAPHY N/A 02/09/2020   Procedure: RIGHT/LEFT HEART CATH AND CORONARY ANGIOGRAPHY;  Surgeon: Burnell Blanks, MD;  Location: [facility];  Service: Cardiovascular;  Laterality: N/A;   VIDEO BRONCHOSCOPY WITH ENDOBRONCHIAL NAVIGATION N/A 09/27/2019   Procedure: VIDEO BRONCHOSCOPY WITH ENDOBRONCHIAL NAVIGATION;   Surgeon: Garner Nash, DO;  Location: [facility];  Service: Pulmonary;  Laterality: N/A;   VIDEO BRONCHOSCOPY WITH ENDOBRONCHIAL ULTRASOUND N/A 03/29/2021   Procedure: VIDEO BRONCHOSCOPY WITH ENDOBRONCHIAL ULTRASOUND;  Surgeon: Garner Nash, DO;  Location: [facility];  Service: Pulmonary;  Laterality: N/A;    Social History   Socioeconomic History   Marital status: Married    Spouse name: Not on file   Number of children: Not on file   Years of education: Not on file   Highest education level: Not on file  Occupational History   Not on file  Tobacco Use   Smoking status: Every Day    Packs/day: 2.00    Years: 62.00    Pack years: 124.00    Types: Cigarettes    Passive exposure: Past   Smokeless tobacco: Never   Tobacco comments:    Pt currently using nicotine patch.  Vaping Use   Vaping Use: Never used  Substance and Sexual Activity   Alcohol use: Not Currently    Comment: rare   Drug use: Never   Sexual activity: Not Currently  Other Topics Concern   Not on file  Social History Narrative   Not on file   Social Determinants of Health   Financial Resource Strain: Not on file  Food Insecurity: Not on file  Transportation Needs: Not on file  Physical Activity: Not on file  Stress: Not on file  Social Connections: Not on file    Family History  Problem Relation Age of Onset   Heart attack Father    Mesothelioma Sister 68   Lung cancer Brother     Health Maintenance  Topic Date Due   TETANUS/TDAP  Never done   Zoster Vaccines- Shingrix (1 of 2) Never done   Pneumonia Vaccine 27+ Years old (2 - PPSV23 if available, else PCV20) 03/06/2016   COVID-19 Vaccine (3 - Moderna risk series) 06/22/2019   INFLUENZA VACCINE  Completed   HPV VACCINES  Aged Out      ----------------------------------------------------------------------------------------------------------------------------------------------------------------------------------------------------------------- Physical Exam BP 124/66 (BP Location: Left Arm, Patient Position: Sitting, Cuff Size: Normal)    Pulse 76    Ht 6' (1.829 m)    Wt 209 lb (94.8 kg)    SpO2 96%    BMI 28.35 kg/m   Physical Exam Constitutional:      Appearance: Normal appearance.  Eyes:     General: No scleral icterus. Cardiovascular:     Rate and Rhythm: Normal rate and regular rhythm.  Pulmonary:     Effort: Pulmonary effort is normal.     Breath sounds: Normal breath sounds.  Musculoskeletal:     Cervical back: Neck supple.  Neurological:     General: No focal deficit present.     Mental Status: He is alert.  Psychiatric:        Mood and Affect: Mood normal.        Behavior: Behavior normal.    ------------------------------------------------------------------------------------------------------------------------------------------------------------------------------------------------------------------- Assessment and Plan  CAP (community acquired pneumonia) Completed course of antibiotics.  He is no longer  using oxygen.  Still has some fatigue but denies dyspnea.  Tobacco abuse He has been able to remain quit from smoking since discharge from the hospital with use of nicotine patch.  Encouraged to remain quit.  Pressure ulcer, stage 2 ([facility]) Still has some tenderness over these areas however skin appears to be healing well.  Acute urinary retention Still has indwelling Foley catheter.  Scheduled to see urology next week.  AKI (acute kidney injury) ([facility]) Renal function has improved at this point.  Elevated LFTs Transaminitis has resolved.  He can go ahead and restart rosuvastatin.  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   No  orders of the defined types were placed in this encounter.   Return in about 6 weeks (around 06/20/2021) for F/u Urinary retention/Pneumonia.    This visit occurred during the SARS-CoV-2 public health emergency.  Safety protocols were in place, including screening questions prior to the visit, additional usage of staff PPE, and extensive cleaning of exam room while observing appropriate contact time as indicated for disinfecting solutions.

## 2021-05-10 ENCOUNTER — Telehealth: Payer: Self-pay

## 2021-05-10 ENCOUNTER — Telehealth: Payer: Self-pay | Admitting: Internal Medicine

## 2021-05-10 ENCOUNTER — Other Ambulatory Visit: Payer: Self-pay

## 2021-05-10 DIAGNOSIS — R2681 Unsteadiness on feet: Secondary | ICD-10-CM

## 2021-05-10 MED ORDER — AMBULATORY NON FORMULARY MEDICATION: 0 refills | Status: AC

## 2021-05-10 MED ORDER — TRELEGY ELLIPTA 100-62.5-25 MCG/ACT IN AEPB: 1.0000 | INHALATION_SPRAY | Freq: Every day | RESPIRATORY_TRACT | 0 refills | Status: DC

## 2021-05-10 NOTE — Telephone Encounter (Signed)
Faxed Rx to [facility].   Confirmation received.

## 2021-05-10 NOTE — Telephone Encounter (Signed)
Called patient regarding upcoming appointments, spoke with patient's spouse. Patient will be notified of upcoming appointments.

## 2021-05-10 NOTE — Telephone Encounter (Signed)
DME: [facility] (Fax: [phone])  Prescription for  4-wheel walker with seat including Dx code.

## 2021-05-14 ENCOUNTER — Encounter (HOSPITAL_COMMUNITY): Payer: Self-pay

## 2021-05-14 ENCOUNTER — Other Ambulatory Visit: Payer: Medicare Other

## 2021-05-14 ENCOUNTER — Other Ambulatory Visit: Payer: Self-pay

## 2021-05-14 ENCOUNTER — Inpatient Hospital Stay: Payer: Medicare Other

## 2021-05-14 DIAGNOSIS — Z5111 Encounter for antineoplastic chemotherapy: Secondary | ICD-10-CM | POA: Diagnosis not present

## 2021-05-14 DIAGNOSIS — C3490 Malignant neoplasm of unspecified part of unspecified bronchus or lung: Secondary | ICD-10-CM

## 2021-05-14 LAB — CBC WITH DIFFERENTIAL (CANCER CENTER ONLY)
Abs Immature Granulocytes: 0.04 10*3/uL (ref 0.00–0.07)
Basophils Absolute: 0.1 10*3/uL (ref 0.0–0.1)
Basophils Relative: 1 %
Eosinophils Absolute: 0 10*3/uL (ref 0.0–0.5)
Eosinophils Relative: 0 %
HCT: 36.3 % — ABNORMAL LOW (ref 39.0–52.0)
Hemoglobin: 12 g/dL — ABNORMAL LOW (ref 13.0–17.0)
Immature Granulocytes: 1 %
Lymphocytes Relative: 16 %
Lymphs Abs: 0.9 10*3/uL (ref 0.7–4.0)
MCH: 30.8 pg (ref 26.0–34.0)
MCHC: 33.1 g/dL (ref 30.0–36.0)
MCV: 93.1 fL (ref 80.0–100.0)
Monocytes Absolute: 0.2 10*3/uL (ref 0.1–1.0)
Monocytes Relative: 3 %
Neutro Abs: 4.4 10*3/uL (ref 1.7–7.7)
Neutrophils Relative %: 79 %
Platelet Count: 190 10*3/uL (ref 150–400)
RBC: 3.9 MIL/uL — ABNORMAL LOW (ref 4.22–5.81)
RDW: 13.4 % (ref 11.5–15.5)
WBC Count: 5.6 10*3/uL (ref 4.0–10.5)
nRBC: 0 % (ref 0.0–0.2)

## 2021-05-14 LAB — CMP (CANCER CENTER ONLY)
ALT: 14 U/L (ref 0–44)
AST: 13 U/L — ABNORMAL LOW (ref 15–41)
Albumin: 3.3 g/dL — ABNORMAL LOW (ref 3.5–5.0)
Alkaline Phosphatase: 64 U/L (ref 38–126)
Anion gap: 8 (ref 5–15)
BUN: 25 mg/dL — ABNORMAL HIGH (ref 8–23)
CO2: 27 mmol/L (ref 22–32)
Calcium: 8.5 mg/dL — ABNORMAL LOW (ref 8.9–10.3)
Chloride: 98 mmol/L (ref 98–111)
Creatinine: 1.02 mg/dL (ref 0.61–1.24)
GFR, Estimated: >60 mL/min (ref >60)
Glucose, Bld: 92 mg/dL (ref 70–99)
Potassium: 4.2 mmol/L (ref 3.5–5.1)
Sodium: 133 mmol/L — ABNORMAL LOW (ref 135–145)
Total Bilirubin: 0.3 mg/dL (ref 0.3–1.2)
Total Protein: 6.5 g/dL (ref 6.5–8.1)

## 2021-05-15 NOTE — Telephone Encounter (Signed)
Rosuvastatin on hold while patient in hospital, he has since been restarted on it! No changes to be addressed at this time!

## 2021-05-15 NOTE — Telephone Encounter (Signed)
No rush, we can recheck at his upcoming visit and give him some more time, appreciate it.

## 2021-05-20 ENCOUNTER — Telehealth: Payer: Self-pay

## 2021-05-20 ENCOUNTER — Other Ambulatory Visit: Payer: Self-pay | Admitting: Radiology

## 2021-05-20 ENCOUNTER — Other Ambulatory Visit (HOSPITAL_COMMUNITY): Payer: Self-pay | Admitting: Physician Assistant

## 2021-05-20 NOTE — Telephone Encounter (Signed)
Pts wife, [name], [place] stating pt had "particles and some blood" in his catheter and she was advised by his Urology office that this was normal and given he has to sx, there is no concern for UTI at this time. She further states she was advised to call back if his catheter becomes blocked.  Mrs. Moor wanted to know if pt can still have his PAC placed tomorrow. I have advised that unless anything changes, it is okay for the pt to have his PAC placed tomorrow. Darlene expressed understanding of this information.

## 2021-05-21 ENCOUNTER — Inpatient Hospital Stay: Payer: Medicare Other

## 2021-05-21 ENCOUNTER — Other Ambulatory Visit: Payer: Medicare Other

## 2021-05-21 ENCOUNTER — Ambulatory Visit (HOSPITAL_COMMUNITY)
Admission: RE | Admit: 2021-05-21 | Discharge: 2021-05-21 | Disposition: A | Discharge status: Home / Self Care | Payer: Medicare Other | Source: Ambulatory Visit | Attending: Internal Medicine | Admitting: Internal Medicine

## 2021-05-21 ENCOUNTER — Other Ambulatory Visit: Payer: Self-pay | Admitting: Internal Medicine

## 2021-05-21 ENCOUNTER — Encounter (HOSPITAL_COMMUNITY): Payer: Self-pay

## 2021-05-21 ENCOUNTER — Other Ambulatory Visit: Payer: Self-pay

## 2021-05-21 DIAGNOSIS — C3411 Malignant neoplasm of upper lobe, right bronchus or lung: Secondary | ICD-10-CM | POA: Diagnosis present

## 2021-05-21 DIAGNOSIS — J449 Chronic obstructive pulmonary disease, unspecified: Secondary | ICD-10-CM | POA: Diagnosis not present

## 2021-05-21 DIAGNOSIS — Z87891 Personal history of nicotine dependence: Secondary | ICD-10-CM | POA: Diagnosis not present

## 2021-05-21 DIAGNOSIS — E7800 Pure hypercholesterolemia, unspecified: Secondary | ICD-10-CM | POA: Diagnosis not present

## 2021-05-21 DIAGNOSIS — I2510 Atherosclerotic heart disease of native coronary artery without angina pectoris: Secondary | ICD-10-CM | POA: Diagnosis not present

## 2021-05-21 DIAGNOSIS — C3490 Malignant neoplasm of unspecified part of unspecified bronchus or lung: Secondary | ICD-10-CM

## 2021-05-21 DIAGNOSIS — I255 Ischemic cardiomyopathy: Secondary | ICD-10-CM | POA: Insufficient documentation

## 2021-05-21 HISTORY — PX: IR US GUIDE VASC ACCESS LEFT: IMG2389

## 2021-05-21 HISTORY — PX: IR IMAGING GUIDED PORT INSERTION: IMG5740

## 2021-05-21 LAB — CMP (CANCER CENTER ONLY)
ALT: 9 U/L (ref 0–44)
AST: 10 U/L — ABNORMAL LOW (ref 15–41)
Albumin: 3.8 g/dL (ref 3.5–5.0)
Alkaline Phosphatase: 68 U/L (ref 38–126)
Anion gap: 6 (ref 5–15)
BUN: 13 mg/dL (ref 8–23)
CO2: 31 mmol/L (ref 22–32)
Calcium: 9.2 mg/dL (ref 8.9–10.3)
Chloride: 100 mmol/L (ref 98–111)
Creatinine: 0.97 mg/dL (ref 0.61–1.24)
GFR, Estimated: >60 mL/min (ref >60)
Glucose, Bld: 97 mg/dL (ref 70–99)
Potassium: 4.3 mmol/L (ref 3.5–5.1)
Sodium: 137 mmol/L (ref 135–145)
Total Bilirubin: 0.5 mg/dL (ref 0.3–1.2)
Total Protein: 6.9 g/dL (ref 6.5–8.1)

## 2021-05-21 LAB — CBC WITH DIFFERENTIAL (CANCER CENTER ONLY)
Abs Immature Granulocytes: 0.01 10*3/uL (ref 0.00–0.07)
Basophils Absolute: 0.1 10*3/uL (ref 0.0–0.1)
Basophils Relative: 1 %
Eosinophils Absolute: 0.1 10*3/uL (ref 0.0–0.5)
Eosinophils Relative: 2 %
HCT: 36.9 % — ABNORMAL LOW (ref 39.0–52.0)
Hemoglobin: 12.1 g/dL — ABNORMAL LOW (ref 13.0–17.0)
Immature Granulocytes: 0 %
Lymphocytes Relative: 18 %
Lymphs Abs: 0.7 10*3/uL (ref 0.7–4.0)
MCH: 30.7 pg (ref 26.0–34.0)
MCHC: 32.8 g/dL (ref 30.0–36.0)
MCV: 93.7 fL (ref 80.0–100.0)
Monocytes Absolute: 0.7 10*3/uL (ref 0.1–1.0)
Monocytes Relative: 19 %
Neutro Abs: 2.3 10*3/uL (ref 1.7–7.7)
Neutrophils Relative %: 60 %
Platelet Count: 103 10*3/uL — ABNORMAL LOW (ref 150–400)
RBC: 3.94 MIL/uL — ABNORMAL LOW (ref 4.22–5.81)
RDW: 14 % (ref 11.5–15.5)
WBC Count: 3.8 10*3/uL — ABNORMAL LOW (ref 4.0–10.5)
nRBC: 0 % (ref 0.0–0.2)

## 2021-05-21 MED ORDER — MIDAZOLAM HCL 2 MG/2ML IJ SOLN
INTRAMUSCULAR | Status: AC
  Filled 2021-05-21: qty 2

## 2021-05-21 MED ORDER — HEPARIN SOD (PORK) LOCK FLUSH 100 UNIT/ML IV SOLN
INTRAVENOUS | Status: AC
  Filled 2021-05-21: qty 5

## 2021-05-21 MED ORDER — FENTANYL CITRATE (PF) 100 MCG/2ML IJ SOLN
INTRAMUSCULAR | Status: AC
  Filled 2021-05-21: qty 2

## 2021-05-21 MED ORDER — LIDOCAINE-EPINEPHRINE 1 %-1:100000 IJ SOLN
INTRAMUSCULAR | Status: AC
  Filled 2021-05-21: qty 1

## 2021-05-21 MED ORDER — SODIUM CHLORIDE 0.9 % IV SOLN: INTRAVENOUS | Status: DC

## 2021-05-21 MED ORDER — MIDAZOLAM HCL 2 MG/2ML IJ SOLN
INTRAMUSCULAR | Status: AC | PRN
  Administered 2021-05-21: .5 mg via INTRAVENOUS

## 2021-05-21 MED ORDER — LIDOCAINE-EPINEPHRINE 1 %-1:100000 IJ SOLN
INTRAMUSCULAR | Status: AC | PRN
  Administered 2021-05-21: 10 mL via INTRADERMAL

## 2021-05-21 MED ORDER — FENTANYL CITRATE (PF) 100 MCG/2ML IJ SOLN
INTRAMUSCULAR | Status: AC | PRN
  Administered 2021-05-21: 25 ug via INTRAVENOUS

## 2021-05-21 MED ORDER — FENTANYL CITRATE (PF) 100 MCG/2ML IJ SOLN
INTRAMUSCULAR | Status: AC | PRN
Start: 2021-05-21 — End: 2021-05-21
  Administered 2021-05-21: 25 ug via INTRAVENOUS

## 2021-05-21 MED ORDER — HEPARIN SOD (PORK) LOCK FLUSH 100 UNIT/ML IV SOLN
INTRAVENOUS | Status: AC | PRN
  Administered 2021-05-21: 500 [IU] via INTRAVENOUS

## 2021-05-21 NOTE — Procedures (Signed)
Vascular and Interventional Radiology Procedure Note  Patient: Gerald [name] DOB: 1940/08/12 Medical Record Number: [id number] Note Date/Time: 05/21/21 4:45 PM   Performing Physician: Michaelle Birks, MD Assistant(s): None  Diagnosis: Lung cancer  Procedure: PORT PLACEMENT  Anesthesia: Conscious Sedation Complications: None Estimated Blood Loss: Minimal  Findings:  Successful left-sided port placement, with the tip of the catheter in the proximal right atrium.  Plan: Catheter ready for use.  See detailed procedure note with images in PACS. The patient tolerated the procedure well without incident or complication and was returned to Recovery in stable condition.    Michaelle Birks, MD [facility] Radiology   Pager. [place]

## 2021-05-21 NOTE — H&P (Signed)
Referring Physician(s): Mohamed,Mohamed  Supervising Physician: Michaelle Birks  Patient Status:  [facility] OP  Chief Complaint: "I'm getting a port a cath"   Subjective: Pt known to IR service from right thoracentesis on 04/26/21. 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 He presents today for port a cath placement to assist with treatment. He denies fever, HA,CP, dyspnea, cough, abd/back pain,N/V or bleeding. Additional hx as below.   Past Medical History:  Diagnosis Date   Bradycardia    Bursitis    Carotid artery disease (HCC)    COPD (chronic obstructive pulmonary disease) (HCC)    Coronary artery disease    CTO mid-dist CX (fills from left-to-left collaterals), 99% OM2, 30% ost-dist LM, 30% prox-mid LAD, 30% ost-mid CX, 99% prox RCA (too small for PCI), medical management 02/09/20   Hypercholesterolemia    Ischemic cardiomyopathy    Lung cancer ([place])    Lung nodule    nscl ca dx'd 08/2019   Psoriasis    Tobacco abuse    Wears glasses    reading   Past Surgical History:  Procedure Laterality Date   BRONCHIAL BIOPSY  09/27/2019   Procedure: BRONCHIAL BIOPSIES;  Surgeon: Garner Nash, DO;  Location: [place] [facility];  Service: Pulmonary;;   BRONCHIAL BRUSHINGS  09/27/2019   Procedure: BRONCHIAL BRUSHINGS;  Surgeon: Garner Nash, DO;  Location: [place];  Service: Pulmonary;;   BRONCHIAL NEEDLE ASPIRATION BIOPSY  09/27/2019   Procedure: BRONCHIAL NEEDLE ASPIRATION BIOPSIES;  Surgeon: Garner Nash, DO;  Location: [place];  Service: Pulmonary;;   BRONCHIAL NEEDLE ASPIRATION BIOPSY  03/29/2021   Procedure: BRONCHIAL NEEDLE ASPIRATION BIOPSIES;  Surgeon: Garner Nash, DO;  Location: [place] [facility];  Service: Pulmonary;;   BRONCHIAL WASHINGS  09/27/2019   Procedure: BRONCHIAL WASHINGS;  Surgeon: Garner Nash, DO;  Location: [place] [facility];  Service: Pulmonary;;   BRONCHIAL WASHINGS  03/29/2021    Procedure: BRONCHIAL WASHINGS;  Surgeon: Garner Nash, DO;  Location: [place] [facility];  Service: Pulmonary;;   COLONOSCOPY     CRYOTHERAPY  09/27/2019   Procedure: Cydney Ok;  Surgeon: Garner Nash, DO;  Location: [place] [facility];  Service: Pulmonary;;   CRYOTHERAPY  03/29/2021   Procedure: CRYOTHERAPY;  Surgeon: Garner Nash, DO;  Location: [place] [facility];  Service: Pulmonary;;   FIDUCIAL MARKER PLACEMENT  09/27/2019   Procedure: FIDUCIAL MARKER PLACEMENT;  Surgeon: Garner Nash, DO;  Location: [place] [facility];  Service: Pulmonary;;   HEMOSTASIS CONTROL  09/27/2019   Procedure: HEMOSTASIS CONTROL;  Surgeon: Garner Nash, DO;  Location: [place] [facility];  Service: Pulmonary;;   HERNIA REPAIR     umbicial- no longer as a "belly button"   RIGHT/LEFT HEART CATH AND CORONARY ANGIOGRAPHY N/A 02/09/2020   Procedure: RIGHT/LEFT HEART CATH AND CORONARY ANGIOGRAPHY;  Surgeon: Burnell Blanks, MD;  Location: [place] [facility];  Service: Cardiovascular;  Laterality: N/A;   VIDEO BRONCHOSCOPY WITH ENDOBRONCHIAL NAVIGATION N/A 09/27/2019   Procedure: VIDEO BRONCHOSCOPY WITH ENDOBRONCHIAL NAVIGATION;  Surgeon: Garner Nash, DO;  Location: [place];  Service: Pulmonary;  Laterality: N/A;   VIDEO BRONCHOSCOPY WITH ENDOBRONCHIAL ULTRASOUND N/A 03/29/2021   Procedure: VIDEO BRONCHOSCOPY WITH ENDOBRONCHIAL ULTRASOUND;  Surgeon: Garner Nash, DO;  Location: [place];  Service: Pulmonary;  Laterality: N/A;       Allergies: Cosela [trilaciclib]  Medications: Prior to Admission medications   Medication Sig Start Date End Date Taking?  Authorizing Provider  albuterol (VENTOLIN HFA) 108 (90 Base) MCG/ACT inhaler Inhale 2 puffs into the lungs every 6 (six) hours as needed for wheezing or shortness of breath. 04/30/21  Yes Elodia Florence., MD  aspirin EC 81 MG tablet Take 1 tablet (81 mg total) by mouth daily. Swallow whole. 01/26/20  Yes Buford Dresser, MD   clobetasol cream (TEMOVATE) 4.76 % Apply 1 application topically daily as needed (Psoriasis).   Yes [provider]  Fluticasone-Umeclidin-Vilant (TRELEGY ELLIPTA) 100-62.5-25 MCG/ACT AEPB Inhale 1 puff into the lungs daily. 05/10/21  Yes Icard, Octavio Graves, DO  Multiple Vitamins-Minerals (PRESERVISION AREDS 2 PO) Take 1 tablet by mouth in the morning and at bedtime.   Yes [provider]  niacinamide 500 MG tablet Take 500 mg by mouth daily with breakfast.   Yes [provider]  nicotine (NICODERM CQ - DOSED IN MG/24 HOURS) 21 mg/24hr patch Place 1 patch (21 mg total) onto the skin daily. 05/01/21  Yes Elodia Florence., MD  prochlorperazine (COMPAZINE) 10 MG tablet Take 1 tablet (10 mg total) by mouth every 6 (six) hours as needed. 04/10/21  Yes Heilingoetter, Cassandra L, PA-C  tamsulosin (FLOMAX) 0.4 MG CAPS capsule Take 1 capsule (0.4 mg total) by mouth daily after supper. 04/30/21 05/30/21 Yes Elodia Florence., MD  triamcinolone cream (KENALOG) 0.1 % Apply 1 application topically daily as needed (skin irritation.). 02/01/21  Yes [provider]  vitamin B-12 (CYANOCOBALAMIN) 500 MCG tablet Take 500 mcg by mouth every evening.   Yes [provider]  AMBULATORY NON FORMULARY MEDICATION Please provide 4 wheel rolling walker with seat for [name].  Diagnosis: R26.81 Gait instability 05/10/21   [name], DO  lidocaine-prilocaine (EMLA) cream Apply to the Port-A-Cath site 30-60-minute before chemotherapy. 04/16/21   Curt Bears, MD     Vital Signs: BP 109/69    Pulse 83    Temp 98.2 F (36.8 C) (Oral)    Resp 18    SpO2 95%   Physical Exam awake/alert; chest- dim BS rt base , left clear; heart- RRR; abd- protuberant, soft,+BS,NT; no LE edema  Imaging: No results found.  Labs:  CBC: Recent Labs    04/30/21 0445 05/07/21 0950 05/14/21 1340 05/21/21 1203  WBC 2.2* 8.6 5.6 3.8*  HGB 12.1* 13.3 12.0* 12.1*  HCT 38.0* 40.7  36.3* 36.9*  PLT 123* 282 190 103*    COAGS: Recent Labs    04/23/21 2214  INR 1.1  APTT 29    BMP: Recent Labs    04/30/21 0445 05/07/21 0950 05/14/21 1340 05/21/21 1203  NA 137 136 133* 137  K 4.4 4.1 4.2 4.3  CL 101 101 98 100  CO2 29 29 27 31   GLUCOSE 98 100* 92 97  BUN 23 13 25* 13  CALCIUM 8.9 8.9 8.5* 9.2  CREATININE 0.77 0.87 1.02 0.97  GFRNONAA >60 >60 >60 >60    LIVER FUNCTION TESTS: Recent Labs    04/30/21 0445 05/07/21 0950 05/14/21 1340 05/21/21 1203  BILITOT 0.2* 0.4 0.3 0.5  AST 106* 12* 13* 10*  ALT 228* 29 14 9   ALKPHOS 101 78 64 68  PROT 6.1* 6.4* 6.5 6.9  ALBUMIN 2.6* 3.5 3.3* 3.8    Assessment and Plan: Pt known to IR service from right thoracentesis on 04/26/21. 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 He presents today for port  a cath placement to assist with treatment.Risks and benefits of image guided port-a-catheter placement was discussed with the patient including, but not limited to bleeding, infection, pneumothorax, or fibrin sheath development and need for additional procedures.  All of the patient's questions were answered, patient is agreeable to proceed. Consent signed and in chart.    Electronically Signed: D. Rowe Robert, PA-C 05/21/2021, 1:15 PM   I spent a total of 25 Minutes at the the patient's bedside AND on the patient's hospital floor or unit, greater than 50% of which was counseling/coordinating care for port a cath placement

## 2021-05-21 NOTE — Discharge Instructions (Signed)
Interventional radiology phone numbers °[phone] °After hours [phone] ° ° ° °You have skin glue (dermabond) over your new port. Do not use the lidocaine cream (EMLA cream) over the skin glue until it has healed. The petroleum in the lidocaine cream will dissolve the skin glue resulting in an infection of your new port. Use ice in a zip lock bag for 1-2 minutes over your new port before the cancer center nurses access your port. ° ° °Implanted Port Insertion, Care After °This sheet gives you information about how to care for yourself after your procedure. Your health care provider may also give you more specific instructions. If you have problems or questions, contact your health care provider. °What can I expect after the procedure? °After the procedure, it is common to have: °Discomfort at the port insertion site. °Bruising on the skin over the port. This should improve over 3-4 days. °Follow these instructions at home: °Port care °After your port is placed, you will get a manufacturer's information card. The card has information about your port. Keep this card with you at all times. °Take care of the port as told by your health care provider. Ask your health care provider if you or a family member can get training for taking care of the port at home. A home health care nurse may also take care of the port. °Make sure to remember what type of port you have. °Incision care °Follow instructions from your health care provider about how to take care of your port insertion site. Make sure you: °Wash your hands with soap and water before and after you change your bandage (dressing). If soap and water are not available, use hand sanitizer. °Change your dressing as told by your health care provider. °Leave skin glue in place. These skin closures may need to stay in place for 2 weeks or longer.  °Check your port insertion site every day for signs of infection. Check for: °Redness, swelling, or pain. °Fluid or  blood. °Warmth. °Pus or a bad smell.  °  °  °Activity °Return to your normal activities as told by your health care provider. Ask your health care provider what activities are safe for you. °Do not lift anything that is heavier than 10 lb (4.5 kg), or the limit that you are told, until your health care provider says that it is safe. °General instructions °Take over-the-counter and prescription medicines only as told by your health care provider. °Do not take baths, swim, or use a hot tub until your health care provider approves.You may remove your dressing tomorrow and shower 24 hours after your procedure. °Do not drive for 24 hours if you were given a sedative during your procedure. °Wear a medical alert bracelet in case of an emergency. This will tell any health care providers that you have a port. °Keep all follow-up visits as told by your health care provider. This is important. °Contact a health care provider if: °You cannot flush your port with saline as directed, or you cannot draw blood from the port. °You have a fever or chills. °You have redness, swelling, or pain around your port insertion site. °You have fluid or blood coming from your port insertion site. °Your port insertion site feels warm to the touch. °You have pus or a bad smell coming from the port insertion site. °Get help right away if: °You have chest pain or shortness of breath. °You have bleeding from your port that you cannot control. °Summary °Take care of   the port as told by your health care provider. Keep the manufacturer's information card with you at all times. °Change your dressing as told by your health care provider. °Contact a health care provider if you have a fever or chills or if you have redness, swelling, or pain around your port insertion site. °Keep all follow-up visits as told by your health care provider. °This information is not intended to replace advice given to you by your health care provider. Make sure you discuss any  questions you have with your health care provider. °Document Revised: 10/13/2017 Document Reviewed: 10/13/2017 °Elsevier Patient Education © 2021 [employer] ° ° ° °Moderate Conscious Sedation, Adult, Care After °This sheet gives you information about how to care for yourself after your procedure. Your health care provider may also give you more specific instructions. If you have problems or questions, contact your health care provider. °What can I expect after the procedure? °After the procedure, it is common to have: °Sleepiness for several hours. °Impaired judgment for several hours. °Difficulty with balance. °Vomiting if you eat too soon. °Follow these instructions at home: °For the time period you were told by your health care provider: °Rest. °Do not participate in activities where you could fall or become injured. °Do not drive or use machinery. °Do not drink alcohol. °Do not take sleeping pills or medicines that cause drowsiness. °Do not make important decisions or sign legal documents. °Do not take care of children on your own.  °  °  °Eating and drinking °Follow the diet recommended by your health care provider. °Drink enough fluid to keep your urine pale yellow. °If you vomit: °Drink water, juice, or soup when you can drink without vomiting. °Make sure you have little or no nausea before eating solid foods.   °General instructions °Take over-the-counter and prescription medicines only as told by your health care provider. °Have a responsible adult stay with you for the time you are told. It is important to have someone help care for you until you are awake and alert. °Do not smoke. °Keep all follow-up visits as told by your health care provider. This is important. °Contact a health care provider if: °You are still sleepy or having trouble with balance after 24 hours. °You feel light-headed. °You keep feeling nauseous or you keep vomiting. °You develop a rash. °You have a fever. °You have redness or  swelling around the IV site. °Get help right away if: °You have trouble breathing. °You have new-onset confusion at home. °Summary °After the procedure, it is common to feel sleepy, have impaired judgment, or feel nauseous if you eat too soon. °Rest after you get home. Know the things you should not do after the procedure. °Follow the diet recommended by your health care provider and drink enough fluid to keep your urine pale yellow. °Get help right away if you have trouble breathing or new-onset confusion at home. °This information is not intended to replace advice given to you by your health care provider. Make sure you discuss any questions you have with your health care provider. °Document Revised: 07/15/2019 Document Reviewed: 02/10/2019 °Elsevier Patient Education © 2021 [employer]  °

## 2021-05-28 ENCOUNTER — Inpatient Hospital Stay (HOSPITAL_BASED_OUTPATIENT_CLINIC_OR_DEPARTMENT_OTHER): Payer: Medicare Other | Admitting: Internal Medicine

## 2021-05-28 ENCOUNTER — Other Ambulatory Visit: Payer: Self-pay

## 2021-05-28 ENCOUNTER — Inpatient Hospital Stay: Payer: Medicare Other

## 2021-05-28 ENCOUNTER — Other Ambulatory Visit: Payer: Medicare Other

## 2021-05-28 VITALS — BP 101/55 | HR 93 | Temp 97.3°F | Resp 17 | Ht 72.0 in | Wt 197.5 lb

## 2021-05-28 DIAGNOSIS — Z5112 Encounter for antineoplastic immunotherapy: Secondary | ICD-10-CM

## 2021-05-28 DIAGNOSIS — C3411 Malignant neoplasm of upper lobe, right bronchus or lung: Secondary | ICD-10-CM

## 2021-05-28 DIAGNOSIS — R5383 Other fatigue: Secondary | ICD-10-CM

## 2021-05-28 DIAGNOSIS — C3490 Malignant neoplasm of unspecified part of unspecified bronchus or lung: Secondary | ICD-10-CM

## 2021-05-28 DIAGNOSIS — Z95828 Presence of other vascular implants and grafts: Secondary | ICD-10-CM

## 2021-05-28 DIAGNOSIS — Z5111 Encounter for antineoplastic chemotherapy: Secondary | ICD-10-CM | POA: Diagnosis not present

## 2021-05-28 LAB — CBC WITH DIFFERENTIAL (CANCER CENTER ONLY)
Abs Immature Granulocytes: 0.21 10*3/uL — ABNORMAL HIGH (ref 0.00–0.07)
Basophils Absolute: 0.1 10*3/uL (ref 0.0–0.1)
Basophils Relative: 1 %
Eosinophils Absolute: 0.2 10*3/uL (ref 0.0–0.5)
Eosinophils Relative: 3 %
HCT: 34.3 % — ABNORMAL LOW (ref 39.0–52.0)
Hemoglobin: 11.7 g/dL — ABNORMAL LOW (ref 13.0–17.0)
Immature Granulocytes: 4 %
Lymphocytes Relative: 14 %
Lymphs Abs: 0.9 10*3/uL (ref 0.7–4.0)
MCH: 31.5 pg (ref 26.0–34.0)
MCHC: 34.1 g/dL (ref 30.0–36.0)
MCV: 92.5 fL (ref 80.0–100.0)
Monocytes Absolute: 0.8 10*3/uL (ref 0.1–1.0)
Monocytes Relative: 14 %
Neutro Abs: 3.8 10*3/uL (ref 1.7–7.7)
Neutrophils Relative %: 64 %
Platelet Count: 225 10*3/uL (ref 150–400)
RBC: 3.71 MIL/uL — ABNORMAL LOW (ref 4.22–5.81)
RDW: 14.6 % (ref 11.5–15.5)
WBC Count: 6 10*3/uL (ref 4.0–10.5)
nRBC: 0.3 % — ABNORMAL HIGH (ref 0.0–0.2)

## 2021-05-28 LAB — CMP (CANCER CENTER ONLY)
ALT: 8 U/L (ref 0–44)
AST: 11 U/L — ABNORMAL LOW (ref 15–41)
Albumin: 3.6 g/dL (ref 3.5–5.0)
Alkaline Phosphatase: 68 U/L (ref 38–126)
Anion gap: 6 (ref 5–15)
BUN: 17 mg/dL (ref 8–23)
CO2: 29 mmol/L (ref 22–32)
Calcium: 9.1 mg/dL (ref 8.9–10.3)
Chloride: 101 mmol/L (ref 98–111)
Creatinine: 0.99 mg/dL (ref 0.61–1.24)
GFR, Estimated: >60 mL/min
Glucose, Bld: 139 mg/dL — ABNORMAL HIGH (ref 70–99)
Potassium: 4 mmol/L (ref 3.5–5.1)
Sodium: 136 mmol/L (ref 135–145)
Total Bilirubin: 0.4 mg/dL (ref 0.3–1.2)
Total Protein: 6.7 g/dL (ref 6.5–8.1)

## 2021-05-28 LAB — TSH: TSH: 1.377 u[IU]/mL (ref 0.320–4.118)

## 2021-05-28 MED ORDER — HEPARIN SOD (PORK) LOCK FLUSH 100 UNIT/ML IV SOLN
500.0000 [IU] | Freq: Once | INTRAVENOUS | Status: AC | PRN
  Administered 2021-05-28: 500 [IU]

## 2021-05-28 MED ORDER — SODIUM CHLORIDE 0.9 % IV SOLN: Freq: Once | INTRAVENOUS | Status: AC

## 2021-05-28 MED ORDER — SODIUM CHLORIDE 0.9 % IV SOLN
390.0000 mg | Freq: Once | INTRAVENOUS | Status: AC
  Administered 2021-05-28: 390 mg via INTRAVENOUS
  Filled 2021-05-28: qty 39

## 2021-05-28 MED ORDER — SODIUM CHLORIDE 0.9% FLUSH
10.0000 mL | Freq: Once | INTRAVENOUS | Status: AC
  Administered 2021-05-28: 10 mL

## 2021-05-28 MED ORDER — TRILACICLIB DIHYDROCHLORIDE INJECTION 300 MG
240.0000 mg/m2 | Freq: Once | INTRAVENOUS | Status: AC
  Administered 2021-05-28: 510 mg via INTRAVENOUS
  Filled 2021-05-28: qty 34

## 2021-05-28 MED ORDER — SODIUM CHLORIDE 0.9 % IV SOLN
80.0000 mg/m2 | Freq: Once | INTRAVENOUS | Status: AC
  Administered 2021-05-28: 170 mg via INTRAVENOUS
  Filled 2021-05-28: qty 8.5

## 2021-05-28 MED ORDER — FAMOTIDINE IN NACL 20-0.9 MG/50ML-% IV SOLN
20.0000 mg | Freq: Once | INTRAVENOUS | Status: AC
  Administered 2021-05-28: 20 mg via INTRAVENOUS
  Filled 2021-05-28: qty 50

## 2021-05-28 MED ORDER — SODIUM CHLORIDE 0.9 % IV SOLN
1500.0000 mg | Freq: Once | INTRAVENOUS | Status: AC
  Administered 2021-05-28: 1500 mg via INTRAVENOUS
  Filled 2021-05-28: qty 30

## 2021-05-28 MED ORDER — SODIUM CHLORIDE 0.9 % IV SOLN
150.0000 mg | Freq: Once | INTRAVENOUS | Status: AC
  Administered 2021-05-28: 150 mg via INTRAVENOUS
  Filled 2021-05-28: qty 150

## 2021-05-28 MED ORDER — SODIUM CHLORIDE 0.9% FLUSH
10.0000 mL | INTRAVENOUS | Status: DC | PRN
  Administered 2021-05-28: 10 mL

## 2021-05-28 MED ORDER — PALONOSETRON HCL INJECTION 0.25 MG/5ML
0.2500 mg | Freq: Once | INTRAVENOUS | Status: AC
  Administered 2021-05-28: 0.25 mg via INTRAVENOUS
  Filled 2021-05-28: qty 5

## 2021-05-28 MED ORDER — SODIUM CHLORIDE 0.9 % IV SOLN
10.0000 mg | Freq: Once | INTRAVENOUS | Status: AC
  Administered 2021-05-28: 10 mg via INTRAVENOUS
  Filled 2021-05-28: qty 10

## 2021-05-28 NOTE — Progress Notes (Signed)
[facility] [phone]   [phone]  OFFICE PROGRESS NOTE  [name], DO [place]  [address] [place] [address]  DIAGNOSIS: Extensive stage (T2 a, N2, M1b) small cell lung cancer that was initially diagnosed as synchronous stage Ia non-small cell lung cancer, squamous cell carcinoma involving right upper lobe pulmonary nodule in addition to right lower lobe endobronchial lesion diagnosed in June 2021.  The patient has evidence for disease recurrence and metastasis in December 2022.  PRIOR THERAPY: SBRT to the right upper lobe pulmonary nodule as well as the right lower lobe pulmonary nodule under the care of Dr. Sondra Come completed on 11/14/2019.   CURRENT THERAPY: Palliative systemic chemotherapy with carboplatin for an AUC of 5, etoposide 100 mg/m2, and Imfinzi 1500 mg IV every 3 weeks with Cosela.  First dose expected on 04/16/21.  Status post 2 cycles.  INTERVAL HISTORY: Gerald [name] 81 y.o. male returns to the clinic today for follow-up visit accompanied by his wife and his daughter was available by FaceTime during the visit.  The patient is feeling fine today with no concerning complaints.  He tolerated the second cycle of his treatment with chemotherapy fairly well.  He has no chest pain, shortness of breath except with exertion with mild cough and no hemoptysis.  He has no nausea, vomiting, diarrhea or constipation.  He has no headache or visual changes.  He denied having any significant weight loss or night sweats.  He is here today for evaluation before starting cycle #3 of his treatment.  He had a Port-A-Cath placed recently.   MEDICAL HISTORY: Past Medical History:  Diagnosis Date   Bradycardia    Bursitis    Carotid artery disease (HCC)    COPD (chronic obstructive pulmonary disease) (HCC)    Coronary artery disease    CTO mid-dist CX (fills from left-to-left collaterals), 99% OM2, 30% ost-dist LM, 30% prox-mid LAD,  30% ost-mid CX, 99% prox RCA (too small for PCI), medical management 02/09/20   Hypercholesterolemia    Ischemic cardiomyopathy    Lung cancer ([facility])    Lung nodule    nscl ca dx'd 08/2019   Psoriasis    Tobacco abuse    Wears glasses    reading    ALLERGIES:  is allergic to cosela [trilaciclib].  MEDICATIONS:  Current Outpatient Medications  Medication Sig Dispense Refill   albuterol (VENTOLIN HFA) 108 (90 Base) MCG/ACT inhaler Inhale 2 puffs into the lungs every 6 (six) hours as needed for wheezing or shortness of breath. 8 g 2   AMBULATORY NON FORMULARY MEDICATION Please provide 4 wheel rolling walker with seat for [employer].  Diagnosis: R26.81 Gait instability 1 Device 0   aspirin EC 81 MG tablet Take 1 tablet (81 mg total) by mouth daily. Swallow whole. 90 tablet 3   clobetasol cream (TEMOVATE) 4.65 % Apply 1 application topically daily as needed (Psoriasis).     Fluticasone-Umeclidin-Vilant (TRELEGY ELLIPTA) 100-62.5-25 MCG/ACT AEPB Inhale 1 puff into the lungs daily. 60 each 0   lidocaine-prilocaine (EMLA) cream Apply to the Port-A-Cath site 30-60-minute before chemotherapy. 30 g 0   Multiple Vitamins-Minerals (PRESERVISION AREDS 2 PO) Take 1 tablet by mouth in the morning and at bedtime.     niacinamide 500 MG tablet Take 500 mg by mouth daily with breakfast.     nicotine (NICODERM CQ - DOSED IN MG/24 HOURS) 21 mg/24hr patch Place 1 patch (21 mg total) onto the  skin daily. 28 patch 0   prochlorperazine (COMPAZINE) 10 MG tablet Take 1 tablet (10 mg total) by mouth every 6 (six) hours as needed. 30 tablet 2   tamsulosin (FLOMAX) 0.4 MG CAPS capsule Take 1 capsule (0.4 mg total) by mouth daily after supper. 30 capsule 1   triamcinolone cream (KENALOG) 0.1 % Apply 1 application topically daily as needed (skin irritation.).     vitamin B-12 (CYANOCOBALAMIN) 500 MCG tablet Take 500 mcg by mouth every evening.     No current facility-administered medications for this visit.     SURGICAL HISTORY:  Past Surgical History:  Procedure Laterality Date   BRONCHIAL BIOPSY  09/27/2019   Procedure: BRONCHIAL BIOPSIES;  Surgeon: Garner Nash, DO;  Location: [facility];  Service: Pulmonary;;   BRONCHIAL BRUSHINGS  09/27/2019   Procedure: BRONCHIAL BRUSHINGS;  Surgeon: Garner Nash, DO;  Location: [facility];  Service: Pulmonary;;   BRONCHIAL NEEDLE ASPIRATION BIOPSY  09/27/2019   Procedure: BRONCHIAL NEEDLE ASPIRATION BIOPSIES;  Surgeon: Garner Nash, DO;  Location: [facility];  Service: Pulmonary;;   BRONCHIAL NEEDLE ASPIRATION BIOPSY  03/29/2021   Procedure: BRONCHIAL NEEDLE ASPIRATION BIOPSIES;  Surgeon: Garner Nash, DO;  Location: [facility];  Service: Pulmonary;;   BRONCHIAL WASHINGS  09/27/2019   Procedure: BRONCHIAL WASHINGS;  Surgeon: Garner Nash, DO;  Location: [facility];  Service: Pulmonary;;   BRONCHIAL WASHINGS  03/29/2021   Procedure: BRONCHIAL WASHINGS;  Surgeon: Garner Nash, DO;  Location: [facility];  Service: Pulmonary;;   COLONOSCOPY     CRYOTHERAPY  09/27/2019   Procedure: Cydney Ok;  Surgeon: Garner Nash, DO;  Location: [facility];  Service: Pulmonary;;   CRYOTHERAPY  03/29/2021   Procedure: CRYOTHERAPY;  Surgeon: Garner Nash, DO;  Location: [facility];  Service: Pulmonary;;   FIDUCIAL MARKER PLACEMENT  09/27/2019   Procedure: FIDUCIAL MARKER PLACEMENT;  Surgeon: Garner Nash, DO;  Location: [facility];  Service: Pulmonary;;   HEMOSTASIS CONTROL  09/27/2019   Procedure: HEMOSTASIS CONTROL;  Surgeon: Garner Nash, DO;  Location: [facility];  Service: Pulmonary;;   HERNIA REPAIR     umbicial- no longer as a "belly button"   IR IMAGING GUIDED PORT INSERTION  05/21/2021   IR US GUIDE VASC ACCESS LEFT  05/21/2021   RIGHT/LEFT HEART CATH AND CORONARY ANGIOGRAPHY N/A 02/09/2020   Procedure: RIGHT/LEFT HEART CATH AND CORONARY ANGIOGRAPHY;  Surgeon: Burnell Blanks, MD;  Location: [facility];  Service: Cardiovascular;  Laterality: N/A;   VIDEO BRONCHOSCOPY WITH ENDOBRONCHIAL NAVIGATION N/A 09/27/2019   Procedure: VIDEO BRONCHOSCOPY WITH ENDOBRONCHIAL NAVIGATION;  Surgeon: Garner Nash, DO;  Location: [facility];  Service: Pulmonary;  Laterality: N/A;   VIDEO BRONCHOSCOPY WITH ENDOBRONCHIAL ULTRASOUND N/A 03/29/2021   Procedure: VIDEO BRONCHOSCOPY WITH ENDOBRONCHIAL ULTRASOUND;  Surgeon: Garner Nash, DO;  Location: [facility];  Service: Pulmonary;  Laterality: N/A;    REVIEW OF SYSTEMS:  A comprehensive review of systems was negative except for: Constitutional: positive for fatigue   PHYSICAL EXAMINATION: General appearance: alert, cooperative, and no distress Head: Normocephalic, without obvious abnormality, atraumatic Neck: no adenopathy, no JVD, supple, symmetrical, trachea midline, and thyroid not enlarged, symmetric, no tenderness/mass/nodules Lymph nodes: Cervical, supraclavicular, and axillary nodes normal. Resp: clear to auscultation bilaterally Back: symmetric, no curvature. ROM normal. No CVA tenderness. Cardio: regular rate and rhythm, S1, S2 normal, no murmur, click, rub or gallop GI: soft, non-tender; bowel sounds normal; no masses,  no organomegaly Extremities: extremities  normal, atraumatic, no cyanosis or edema  ECOG PERFORMANCE STATUS: 1 - Symptomatic but completely ambulatory  Blood pressure (!) 101/55, pulse 93, temperature (!) 97.3 F (36.3 C), temperature source Tympanic, resp. rate 17, height 6' (1.829 m), weight 197 lb 8 oz (89.6 kg), SpO2 97 %.  LABORATORY DATA: Lab Results  Component Value Date   WBC 6.0 05/28/2021   HGB 11.7 (L) 05/28/2021   HCT 34.3 (L) 05/28/2021   MCV 92.5 05/28/2021   PLT 225 05/28/2021      Chemistry      Component Value Date/Time   NA 137 05/21/2021 1203   NA 139 02/07/2020 1208   K 4.3 05/21/2021 1203   CL 100 05/21/2021 1203   CO2 31 05/21/2021 1203   BUN 13 05/21/2021 1203   BUN 17  02/07/2020 1208   CREATININE 0.97 05/21/2021 1203      Component Value Date/Time   CALCIUM 9.2 05/21/2021 1203   ALKPHOS 68 05/21/2021 1203   AST 10 (L) 05/21/2021 1203   ALT 9 05/21/2021 1203   BILITOT 0.5 05/21/2021 1203       RADIOGRAPHIC STUDIES: IR US Guide Vasc Access Left  Result Date: 05/21/2021 INDICATION: RIGHT lung cancer EXAM: IMPLANTED PORT A CATH PLACEMENT WITH ULTRASOUND AND FLUOROSCOPIC GUIDANCE MEDICATIONS: None ANESTHESIA/SEDATION: Moderate (conscious) sedation was employed during this procedure. A total of Versed 1 mg and Fentanyl 50 mcg was administered intravenously. Moderate Sedation Time: 24 minutes. The patient's level of consciousness and vital signs were monitored continuously by radiology nursing throughout the procedure under my direct supervision. FLUOROSCOPY TIME:  0 minutes, 30 seconds (1 mGy) COMPLICATIONS: None immediate. PROCEDURE: The procedure, risks, benefits, and alternatives were explained to the patient. Questions regarding the procedure were encouraged and answered. The patient understands and consents to the procedure. The LEFT neck and chest were prepped with chlorhexidine in a sterile fashion, and a sterile drape was applied covering the operative field. Maximum barrier sterile technique with sterile gowns and gloves were used for the procedure. A timeout was performed prior to the initiation of the procedure. Local anesthesia was provided with 1% lidocaine with epinephrine. After creating a small venotomy incision, a micropuncture kit was utilized to access the internal jugular vein under direct, real-time ultrasound guidance. Ultrasound image documentation was performed. The microwire was kinked to measure appropriate catheter length. A subcutaneous port pocket was then created along the upper chest wall utilizing a combination of sharp and blunt dissection. The pocket was irrigated with sterile saline. A single lumen ISP power injectable port was chosen  for placement. The 8 Fr catheter was tunneled from the port pocket site to the venotomy incision. The port was placed in the pocket. The external catheter was trimmed to appropriate length. At the venotomy, an 8 Fr peel-away sheath was placed over a guidewire under fluoroscopic guidance. The catheter was then placed through the sheath and the sheath was removed. Final catheter positioning was confirmed and documented with a fluoroscopic spot radiograph. The port was accessed with a Huber needle, aspirated and flushed with heparinized saline. The port pocket incision was closed with interrupted 3-0 Vicryl suture then Dermabond was applied, including at the venotomy incision. Dressings were placed. The patient tolerated the procedure well without immediate post procedural complication. IMPRESSION: Successful placement of a LEFT internal jugular approach power injectable Port-A-Cath. The tip of the catheter is positioned within the proximal RIGHT atrium. The catheter is ready for immediate use. Michaelle Birks, MD [facility]  [facility] Radiology Electronically Signed   By: Michaelle Birks M.D.   On: 05/21/2021 20:08   IR IMAGING GUIDED PORT INSERTION  Result Date: 05/21/2021 INDICATION: RIGHT lung cancer EXAM: IMPLANTED PORT A CATH PLACEMENT WITH ULTRASOUND AND FLUOROSCOPIC GUIDANCE MEDICATIONS: None ANESTHESIA/SEDATION: Moderate (conscious) sedation was employed during this procedure. A total of Versed 1 mg and Fentanyl 50 mcg was administered intravenously. Moderate Sedation Time: 24 minutes. The patient's level of consciousness and vital signs were monitored continuously by radiology nursing throughout the procedure under my direct supervision. FLUOROSCOPY TIME:  0 minutes, 30 seconds (1 mGy) COMPLICATIONS: None immediate. PROCEDURE: The procedure, risks, benefits, and alternatives were explained to the patient. Questions regarding the procedure were encouraged and answered. The  patient understands and consents to the procedure. The LEFT neck and chest were prepped with chlorhexidine in a sterile fashion, and a sterile drape was applied covering the operative field. Maximum barrier sterile technique with sterile gowns and gloves were used for the procedure. A timeout was performed prior to the initiation of the procedure. Local anesthesia was provided with 1% lidocaine with epinephrine. After creating a small venotomy incision, a micropuncture kit was utilized to access the internal jugular vein under direct, real-time ultrasound guidance. Ultrasound image documentation was performed. The microwire was kinked to measure appropriate catheter length. A subcutaneous port pocket was then created along the upper chest wall utilizing a combination of sharp and blunt dissection. The pocket was irrigated with sterile saline. A single lumen ISP power injectable port was chosen for placement. The 8 Fr catheter was tunneled from the port pocket site to the venotomy incision. The port was placed in the pocket. The external catheter was trimmed to appropriate length. At the venotomy, an 8 Fr peel-away sheath was placed over a guidewire under fluoroscopic guidance. The catheter was then placed through the sheath and the sheath was removed. Final catheter positioning was confirmed and documented with a fluoroscopic spot radiograph. The port was accessed with a Huber needle, aspirated and flushed with heparinized saline. The port pocket incision was closed with interrupted 3-0 Vicryl suture then Dermabond was applied, including at the venotomy incision. Dressings were placed. The patient tolerated the procedure well without immediate post procedural complication. IMPRESSION: Successful placement of a LEFT internal jugular approach power injectable Port-A-Cath. The tip of the catheter is positioned within the proximal RIGHT atrium. The catheter is ready for immediate use. Michaelle Birks, MD [facility] Radiology Electronically Signed   By: Michaelle Birks M.D.   On: 05/21/2021 20:08     ASSESSMENT AND PLAN: This is a very pleasant 81 years old white male recently diagnosed with   1) extensive stage (T2 a, N2, M1 B) small cell lung cancer diagnosed in January 2022. Presented with new masslike area in the medial aspect of the right upper lobe with apparent invasion of the mediastinum and bulky right paratracheal lymphadenopathy in addition to liver lesions. The patient is currently undergoing systemic chemotherapy with carboplatin for AUC of 5 on day 1, etoposide 100 Mg/M2 on days 1, 2 and 3 with Cosela 240 Mg/M2 before chemotherapy as well as Imfinzi 1500 Mg IV on day 1 every 3 weeks.  Status post 2 cycles.  The patient tolerated the second cycle of his treatment well with no concerning adverse effects. I recommended for him to proceed with cycle #3 today as planned. I will see him back for follow-up visit in 3 weeks for evaluation before starting cycle #  4 of his chemotherapy. The patient was advised to call immediately if he has any other concerning symptoms in the interval.  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 Sondra Come.   The patient voices understanding of current disease status and treatment options and is in agreement with the current care plan.  All questions were answered. The patient knows to call the clinic with any problems, questions or concerns. We can certainly see the patient much sooner if necessary.   Disclaimer: This note was dictated with voice recognition software. Similar sounding words can inadvertently be transcribed and may not be corrected upon review.

## 2021-05-28 NOTE — Patient Instructions (Addendum)
[facility]COL[facility]   Discharge Instructions: Thank you for choosing [facility] to provide your oncology and hematology care.   If you have a lab appointment with the [facility], please go directly to the [facility] and check in at the registration area.   Wear comfortable clothing and clothing appropriate for easy access to any Portacath or PICC line.   We strive to give you quality time with your provider. You may need to reschedule your appointment if you arrive late (15 or more minutes).  Arriving late affects you and other patients whose appointments are after yours.  Also, if you miss three or more appointments without notifying the office, you may be dismissed from the clinic at the providers discretion.      For prescription refill requests, have your pharmacy contact our office and allow 72 hours for refills to be completed.    Today you received the following chemotherapy and/or immunotherapy agents: durvalumab, carboplatin and etoposide      To help prevent nausea and vomiting after your treatment, we encourage you to take your nausea medication as directed.  BELOW ARE SYMPTOMS THAT SHOULD BE REPORTED IMMEDIATELY: *FEVER GREATER THAN 100.4 F (38 C) OR HIGHER *CHILLS OR SWEATING *NAUSEA AND VOMITING THAT IS NOT CONTROLLED WITH YOUR NAUSEA MEDICATION *UNUSUAL SHORTNESS OF BREATH *UNUSUAL BRUISING OR BLEEDING *URINARY PROBLEMS (pain or burning when urinating, or frequent urination) *BOWEL PROBLEMS (unusual diarrhea, constipation, pain near the anus) TENDERNESS IN MOUTH AND THROAT WITH OR WITHOUT PRESENCE OF ULCERS (sore throat, sores in mouth, or a toothache) UNUSUAL RASH, SWELLING OR PAIN  UNUSUAL VAGINAL DISCHARGE OR ITCHING   Items with * indicate a potential emergency and should be followed up as soon as possible or go to the Emergency Department if any problems should occur.  Please show the CHEMOTHERAPY ALERT CARD or  IMMUNOTHERAPY ALERT CARD at check-in to the Emergency Department and triage nurse.  Should you have questions after your visit or need to cancel or reschedule your appointment, please contact [facility]  Dept: [phone]  and follow the prompts.  Office hours are 8:00 a.m. to 4:30 p.m. Monday - Friday. Please note that voicemails left after 4:00 p.m. may not be returned until the following business day.  We are closed weekends and major holidays. You have access to a nurse at all times for urgent questions. Please call the main number to the clinic Dept: [phone] and follow the prompts.   For any non-urgent questions, you may also contact your provider using MyChart. We now offer e-Visits for anyone 50 and older to request care online for non-urgent symptoms. For details visit [email or web address].   Also download the MyChart app! Go to the app store, search "MyChart", open the app, select [facility], and log in with your MyChart username and password.  Due to Covid, a mask is required upon entering the hospital/clinic. If you do not have a mask, one will be given to you upon arrival. For doctor visits, patients may have 1 support person aged 20 or older with them. For treatment visits, patients cannot have anyone with them due to current Covid guidelines and our immunocompromised population.

## 2021-05-29 ENCOUNTER — Inpatient Hospital Stay: Payer: Medicare Other | Attending: Internal Medicine

## 2021-05-29 ENCOUNTER — Encounter: Payer: Self-pay | Admitting: *Deleted

## 2021-05-29 VITALS — BP 120/72 | HR 88 | Temp 97.6°F | Resp 16

## 2021-05-29 DIAGNOSIS — Z5112 Encounter for antineoplastic immunotherapy: Secondary | ICD-10-CM | POA: Insufficient documentation

## 2021-05-29 DIAGNOSIS — Z79899 Other long term (current) drug therapy: Secondary | ICD-10-CM | POA: Diagnosis not present

## 2021-05-29 DIAGNOSIS — C3411 Malignant neoplasm of upper lobe, right bronchus or lung: Secondary | ICD-10-CM | POA: Diagnosis present

## 2021-05-29 DIAGNOSIS — C781 Secondary malignant neoplasm of mediastinum: Secondary | ICD-10-CM | POA: Diagnosis not present

## 2021-05-29 DIAGNOSIS — Z5111 Encounter for antineoplastic chemotherapy: Secondary | ICD-10-CM | POA: Insufficient documentation

## 2021-05-29 MED ORDER — SODIUM CHLORIDE 0.9 % IV SOLN
10.0000 mg | Freq: Once | INTRAVENOUS | Status: AC
  Administered 2021-05-29: 10 mg via INTRAVENOUS
  Filled 2021-05-29: qty 10

## 2021-05-29 MED ORDER — TRILACICLIB DIHYDROCHLORIDE INJECTION 300 MG
240.0000 mg/m2 | Freq: Once | INTRAVENOUS | Status: DC
  Filled 2021-05-29: qty 34

## 2021-05-29 MED ORDER — TRILACICLIB DIHYDROCHLORIDE INJECTION 300 MG
240.0000 mg/m2 | Freq: Once | INTRAVENOUS | Status: AC
  Administered 2021-05-29: 510 mg via INTRAVENOUS
  Filled 2021-05-29: qty 34

## 2021-05-29 MED ORDER — SODIUM CHLORIDE 0.9% FLUSH
10.0000 mL | INTRAVENOUS | Status: DC | PRN
  Administered 2021-05-29: 10 mL

## 2021-05-29 MED ORDER — SODIUM CHLORIDE 0.9 % IV SOLN: Freq: Once | INTRAVENOUS | Status: AC

## 2021-05-29 MED ORDER — HEPARIN SOD (PORK) LOCK FLUSH 100 UNIT/ML IV SOLN
500.0000 [IU] | Freq: Once | INTRAVENOUS | Status: AC | PRN
  Administered 2021-05-29: 500 [IU]

## 2021-05-29 MED ORDER — FAMOTIDINE IN NACL 20-0.9 MG/50ML-% IV SOLN
20.0000 mg | Freq: Once | INTRAVENOUS | Status: AC
  Administered 2021-05-29: 20 mg via INTRAVENOUS
  Filled 2021-05-29: qty 50

## 2021-05-29 MED ORDER — SODIUM CHLORIDE 0.9 % IV SOLN
80.0000 mg/m2 | Freq: Once | INTRAVENOUS | Status: AC
  Administered 2021-05-29: 170 mg via INTRAVENOUS
  Filled 2021-05-29: qty 8.5

## 2021-05-29 MED FILL — Dexamethasone Sodium Phosphate Inj 100 MG/10ML: INTRAMUSCULAR | Qty: 1 | Status: AC

## 2021-05-29 NOTE — Patient Instructions (Signed)
[facility]   ?Discharge Instructions: ?Thank you for choosing [facility] to provide your oncology and hematology care.  ? ?If you have a lab appointment with the [facility], please go directly to the [facility] and check in at the registration area. ?  ?Wear comfortable clothing and clothing appropriate for easy access to any Portacath or PICC line.  ? ?We strive to give you quality time with your provider. You may need to reschedule your appointment if you arrive late (15 or more minutes).  Arriving late affects you and other patients whose appointments are after yours.  Also, if you miss three or more appointments without notifying the office, you may be dismissed from the clinic at the provider?s discretion.    ?  ?For prescription refill requests, have your pharmacy contact our office and allow 72 hours for refills to be completed.   ? ?Today you received the following chemotherapy and/or immunotherapy agents: Cosela and etoposide    ?  ?To help prevent nausea and vomiting after your treatment, we encourage you to take your nausea medication as directed. ? ?BELOW ARE SYMPTOMS THAT SHOULD BE REPORTED IMMEDIATELY: ?*FEVER GREATER THAN 100.4 F (38 ?C) OR HIGHER ?*CHILLS OR SWEATING ?*NAUSEA AND VOMITING THAT IS NOT CONTROLLED WITH YOUR NAUSEA MEDICATION ?*UNUSUAL SHORTNESS OF BREATH ?*UNUSUAL BRUISING OR BLEEDING ?*URINARY PROBLEMS (pain or burning when urinating, or frequent urination) ?*BOWEL PROBLEMS (unusual diarrhea, constipation, pain near the anus) ?TENDERNESS IN MOUTH AND THROAT WITH OR WITHOUT PRESENCE OF ULCERS (sore throat, sores in mouth, or a toothache) ?UNUSUAL RASH, SWELLING OR PAIN  ?UNUSUAL VAGINAL DISCHARGE OR ITCHING  ? ?Items with * indicate a potential emergency and should be followed up as soon as possible or go to the Emergency Department if any problems should occur. ? ?Please show the CHEMOTHERAPY ALERT CARD or IMMUNOTHERAPY ALERT CARD at  check-in to the Emergency Department and triage nurse. ? ?Should you have questions after your visit or need to cancel or reschedule your appointment, please contact [facility]  Dept: [phone]  and follow the prompts.  Office hours are 8:00 a.m. to 4:30 p.m. Monday - Friday. Please note that voicemails left after 4:00 p.m. may not be returned until the following business day.  We are closed weekends and major holidays. You have access to a nurse at all times for urgent questions. Please call the main number to the clinic Dept: [phone] and follow the prompts. ? ? ?For any non-urgent questions, you may also contact your provider using MyChart. We now offer e-Visits for anyone 60 and older to request care online for non-urgent symptoms. For details visit [email or web address]. ?  ?Also download the MyChart app! Go to the app store, search "MyChart", open the app, select [facility], and log in with your MyChart username and password. ? ?Due to Covid, a mask is required upon entering the hospital/clinic. If you do not have a mask, one will be given to you upon arrival. For doctor visits, patients may have 1 support person aged 9 or older with them. For treatment visits, patients cannot have anyone with them due to current Covid guidelines and our immunocompromised population.  ? ?

## 2021-05-29 NOTE — Progress Notes (Signed)
Oncology Nurse Navigator Documentation ? ?Oncology Nurse Navigator Flowsheets 05/29/2021 04/22/2021 04/12/2021 04/10/2021 04/04/2021 03/27/2021 03/26/2021  ?Abnormal Finding Date - - - - - - 03/01/2021  ?Confirmed Diagnosis Date - - 03/29/2021 - - - -  ?Diagnosis Status - - Confirmed Diagnosis Complete - - - -  ?Planned Course of Treatment - - Chemotherapy;Targeted Therapy - - - -  ?Phase of Treatment - - Targeted Therapy - - - -  ?Chemotherapy Actual Start Date: - - 04/17/2021 - - - -  ?Targeted Therapy Actual Start Date: - - 04/17/2021 - - - -  ?Navigator Follow Up Date: 06/18/2021 - 05/07/2021 04/10/2021 04/05/2021 04/04/2021 03/28/2021  ?Navigator Follow Up Reason: Follow-up Appointment - Follow-up Appointment Follow-up Appointment Appointment Review;Patient Call Pathology Appointment Review  ?Navigator Location [facility]  ?Navigator Encounter Type Clinic/MDC Clinic/MDC Other: Clinic/MDC Telephone Appt/Treatment Plan Review Clinic/MDC  ?Telephone - - - - Outgoing Call - -  ?Treatment Initiated Date - - 04/17/2021 - - - -  ?Patient Visit Type MedOnc/I spoke with patient yesterday at his appt with Dr. Julien Nordmann. He has no complaints. I checked his schedule and he is on track with his plan of care. I helped to educate on tx.  MedOnc Other MedOnc - Other MedOnc  ?Treatment Phase Treatment Treatment - Pre-Tx/Tx Discussion Pre-Tx/Tx Discussion Abnormal Scans -  ?Barriers/Navigation Needs - Programmer, applications of Care Education Coordination of Care;Education Coordination of Care;Education Coordination of Care  ?Education Other Other - Newly Diagnosed Cancer Education;Understanding Cancer/ Treatment Options;Other Other Other -  ?Interventions Education;Psycho-Social Support Education;Psycho-Social Support Coordination of Care Education;Psycho-Social Support Coordination of Care;Education Coordination of Care;Education Coordination  of Care  ?Acuity Level 2-Minimal Needs (1-2 Barriers Identified) Level 2-Minimal Needs (1-2 Barriers Identified) Level 2-Minimal Needs (1-2 Barriers Identified) Level 3-Moderate Needs (3-4 Barriers Identified) Level 2-Minimal Needs (1-2 Barriers Identified) Level 3-Moderate Needs (3-4 Barriers Identified) Level 2-Minimal Needs (1-2 Barriers Identified)  ?Coordination of Care - - Other - - Appts Other  ?Education Method Verbal Verbal;Other - Written;Verbal Verbal Verbal -  ?Time Spent with Patient 15 15 30 30 30  45 30  ?  ?

## 2021-05-30 ENCOUNTER — Inpatient Hospital Stay: Payer: Medicare Other

## 2021-05-30 ENCOUNTER — Other Ambulatory Visit: Payer: Self-pay

## 2021-05-30 VITALS — BP 119/60 | HR 80 | Temp 98.2°F | Resp 16

## 2021-05-30 DIAGNOSIS — Z5111 Encounter for antineoplastic chemotherapy: Secondary | ICD-10-CM | POA: Diagnosis not present

## 2021-05-30 DIAGNOSIS — C3411 Malignant neoplasm of upper lobe, right bronchus or lung: Secondary | ICD-10-CM

## 2021-05-30 MED ORDER — SODIUM CHLORIDE 0.9 % IV SOLN
10.0000 mg | Freq: Once | INTRAVENOUS | Status: AC
  Administered 2021-05-30: 10 mg via INTRAVENOUS
  Filled 2021-05-30: qty 10

## 2021-05-30 MED ORDER — HEPARIN SOD (PORK) LOCK FLUSH 100 UNIT/ML IV SOLN
500.0000 [IU] | Freq: Once | INTRAVENOUS | Status: AC | PRN
  Administered 2021-05-30: 500 [IU]

## 2021-05-30 MED ORDER — FAMOTIDINE IN NACL 20-0.9 MG/50ML-% IV SOLN
20.0000 mg | Freq: Once | INTRAVENOUS | Status: AC
  Administered 2021-05-30: 20 mg via INTRAVENOUS
  Filled 2021-05-30: qty 50

## 2021-05-30 MED ORDER — TRILACICLIB DIHYDROCHLORIDE INJECTION 300 MG
240.0000 mg/m2 | Freq: Once | INTRAVENOUS | Status: AC
  Administered 2021-05-30: 510 mg via INTRAVENOUS
  Filled 2021-05-30: qty 34

## 2021-05-30 MED ORDER — SODIUM CHLORIDE 0.9 % IV SOLN
80.0000 mg/m2 | Freq: Once | INTRAVENOUS | Status: AC
  Administered 2021-05-30: 170 mg via INTRAVENOUS
  Filled 2021-05-30: qty 8.5

## 2021-05-30 MED ORDER — SODIUM CHLORIDE 0.9 % IV SOLN: Freq: Once | INTRAVENOUS | Status: AC

## 2021-05-30 MED ORDER — SODIUM CHLORIDE 0.9% FLUSH
10.0000 mL | INTRAVENOUS | Status: DC | PRN
  Administered 2021-05-30: 10 mL

## 2021-05-30 NOTE — Patient Instructions (Signed)
[facility]   ?Discharge Instructions: ?Thank you for choosing [facility] to provide your oncology and hematology care.  ? ?If you have a lab appointment with the [facility], please go directly to the [facility] and check in at the registration area. ?  ?Wear comfortable clothing and clothing appropriate for easy access to any Portacath or PICC line.  ? ?We strive to give you quality time with your provider. You may need to reschedule your appointment if you arrive late (15 or more minutes).  Arriving late affects you and other patients whose appointments are after yours.  Also, if you miss three or more appointments without notifying the office, you may be dismissed from the clinic at the provider?s discretion.    ?  ?For prescription refill requests, have your pharmacy contact our office and allow 72 hours for refills to be completed.   ? ?Today you received the following chemotherapy and/or immunotherapy agents: Cosela and etoposide    ?  ?To help prevent nausea and vomiting after your treatment, we encourage you to take your nausea medication as directed. ? ?BELOW ARE SYMPTOMS THAT SHOULD BE REPORTED IMMEDIATELY: ?*FEVER GREATER THAN 100.4 F (38 ?C) OR HIGHER ?*CHILLS OR SWEATING ?*NAUSEA AND VOMITING THAT IS NOT CONTROLLED WITH YOUR NAUSEA MEDICATION ?*UNUSUAL SHORTNESS OF BREATH ?*UNUSUAL BRUISING OR BLEEDING ?*URINARY PROBLEMS (pain or burning when urinating, or frequent urination) ?*BOWEL PROBLEMS (unusual diarrhea, constipation, pain near the anus) ?TENDERNESS IN MOUTH AND THROAT WITH OR WITHOUT PRESENCE OF ULCERS (sore throat, sores in mouth, or a toothache) ?UNUSUAL RASH, SWELLING OR PAIN  ?UNUSUAL VAGINAL DISCHARGE OR ITCHING  ? ?Items with * indicate a potential emergency and should be followed up as soon as possible or go to the Emergency Department if any problems should occur. ? ?Please show the CHEMOTHERAPY ALERT CARD or IMMUNOTHERAPY ALERT CARD at  check-in to the Emergency Department and triage nurse. ? ?Should you have questions after your visit or need to cancel or reschedule your appointment, please contact [facility]  Dept: [phone]  and follow the prompts.  Office hours are 8:00 a.m. to 4:30 p.m. Monday - Friday. Please note that voicemails left after 4:00 p.m. may not be returned until the following business day.  We are closed weekends and major holidays. You have access to a nurse at all times for urgent questions. Please call the main number to the clinic Dept: [phone] and follow the prompts. ? ? ?For any non-urgent questions, you may also contact your provider using MyChart. We now offer e-Visits for anyone 72 and older to request care online for non-urgent symptoms. For details visit [email or web address]. ?  ?Also download the MyChart app! Go to the app store, search "MyChart", open the app, select [facility], and log in with your MyChart username and password. ? ?Due to Covid, a mask is required upon entering the hospital/clinic. If you do not have a mask, one will be given to you upon arrival. For doctor visits, patients may have 1 support person aged 58 or older with them. For treatment visits, patients cannot have anyone with them due to current Covid guidelines and our immunocompromised population.  ? ?

## 2021-05-31 ENCOUNTER — Ambulatory Visit: Payer: Medicare Other | Admitting: Family Medicine

## 2021-06-03 ENCOUNTER — Telehealth: Payer: Self-pay | Admitting: Internal Medicine

## 2021-06-03 NOTE — Telephone Encounter (Signed)
Scheduled per 02/28 los, patient has been called and voicemail was left. ?

## 2021-06-04 ENCOUNTER — Other Ambulatory Visit: Payer: Self-pay

## 2021-06-04 ENCOUNTER — Other Ambulatory Visit: Payer: Medicare Other

## 2021-06-04 ENCOUNTER — Inpatient Hospital Stay: Payer: Medicare Other

## 2021-06-04 DIAGNOSIS — C3490 Malignant neoplasm of unspecified part of unspecified bronchus or lung: Secondary | ICD-10-CM

## 2021-06-04 DIAGNOSIS — Z5111 Encounter for antineoplastic chemotherapy: Secondary | ICD-10-CM | POA: Diagnosis not present

## 2021-06-04 DIAGNOSIS — Z95828 Presence of other vascular implants and grafts: Secondary | ICD-10-CM

## 2021-06-04 LAB — CBC WITH DIFFERENTIAL (CANCER CENTER ONLY)
Abs Immature Granulocytes: 0.04 10*3/uL (ref 0.00–0.07)
Basophils Absolute: 0.1 10*3/uL (ref 0.0–0.1)
Basophils Relative: 1 %
Eosinophils Absolute: 0.1 10*3/uL (ref 0.0–0.5)
Eosinophils Relative: 2 %
HCT: 34 % — ABNORMAL LOW (ref 39.0–52.0)
Hemoglobin: 11.5 g/dL — ABNORMAL LOW (ref 13.0–17.0)
Immature Granulocytes: 1 %
Lymphocytes Relative: 14 %
Lymphs Abs: 0.9 10*3/uL (ref 0.7–4.0)
MCH: 31.1 pg (ref 26.0–34.0)
MCHC: 33.8 g/dL (ref 30.0–36.0)
MCV: 91.9 fL (ref 80.0–100.0)
Monocytes Absolute: 0.1 10*3/uL (ref 0.1–1.0)
Monocytes Relative: 1 %
Neutro Abs: 5.2 10*3/uL (ref 1.7–7.7)
Neutrophils Relative %: 81 %
Platelet Count: 171 10*3/uL (ref 150–400)
RBC: 3.7 MIL/uL — ABNORMAL LOW (ref 4.22–5.81)
RDW: 14.2 % (ref 11.5–15.5)
Smear Review: NORMAL
WBC Count: 6.4 10*3/uL (ref 4.0–10.5)
nRBC: 0 % (ref 0.0–0.2)

## 2021-06-04 LAB — CMP (CANCER CENTER ONLY)
ALT: 11 U/L (ref 0–44)
AST: 13 U/L — ABNORMAL LOW (ref 15–41)
Albumin: 3.8 g/dL (ref 3.5–5.0)
Alkaline Phosphatase: 64 U/L (ref 38–126)
Anion gap: 5 (ref 5–15)
BUN: 26 mg/dL — ABNORMAL HIGH (ref 8–23)
CO2: 30 mmol/L (ref 22–32)
Calcium: 9 mg/dL (ref 8.9–10.3)
Chloride: 102 mmol/L (ref 98–111)
Creatinine: 0.85 mg/dL (ref 0.61–1.24)
GFR, Estimated: >60 mL/min (ref >60)
Glucose, Bld: 119 mg/dL — ABNORMAL HIGH (ref 70–99)
Potassium: 4.2 mmol/L (ref 3.5–5.1)
Sodium: 137 mmol/L (ref 135–145)
Total Bilirubin: 0.5 mg/dL (ref 0.3–1.2)
Total Protein: 6.7 g/dL (ref 6.5–8.1)

## 2021-06-04 MED ORDER — HEPARIN SOD (PORK) LOCK FLUSH 100 UNIT/ML IV SOLN
500.0000 [IU] | Freq: Once | INTRAVENOUS | Status: AC
  Administered 2021-06-04: 500 [IU]

## 2021-06-04 MED ORDER — SODIUM CHLORIDE 0.9% FLUSH
10.0000 mL | Freq: Once | INTRAVENOUS | Status: AC
  Administered 2021-06-04: 10 mL

## 2021-06-06 ENCOUNTER — Telehealth: Payer: Self-pay

## 2021-06-06 NOTE — Telephone Encounter (Signed)
I have called the pts wife back to clarify what bandage she was referring to. She states the pt came home with a small round band-aid on his port from it being accessed. ? ?Pts wife was advise the band-aid can be removed. She expressed understanding of this information. ?

## 2021-06-06 NOTE — Telephone Encounter (Signed)
Pts wife LM requesting a call back. She states the pt was advised not to remove the bandage from his port until he got home. She wants to make sure it can be removed. ?

## 2021-06-10 ENCOUNTER — Other Ambulatory Visit: Payer: Self-pay

## 2021-06-10 ENCOUNTER — Encounter (HOSPITAL_BASED_OUTPATIENT_CLINIC_OR_DEPARTMENT_OTHER): Payer: Self-pay | Admitting: Cardiology

## 2021-06-10 ENCOUNTER — Ambulatory Visit (INDEPENDENT_AMBULATORY_CARE_PROVIDER_SITE_OTHER): Payer: Medicare Other | Admitting: Cardiology

## 2021-06-10 VITALS — BP 115/60 | HR 95 | Ht 72.0 in | Wt 195.2 lb

## 2021-06-10 DIAGNOSIS — I255 Ischemic cardiomyopathy: Secondary | ICD-10-CM

## 2021-06-10 DIAGNOSIS — I2584 Coronary atherosclerosis due to calcified coronary lesion: Secondary | ICD-10-CM

## 2021-06-10 DIAGNOSIS — Z87898 Personal history of other specified conditions: Secondary | ICD-10-CM | POA: Diagnosis not present

## 2021-06-10 DIAGNOSIS — I2510 Atherosclerotic heart disease of native coronary artery without angina pectoris: Secondary | ICD-10-CM

## 2021-06-10 DIAGNOSIS — Z7189 Other specified counseling: Secondary | ICD-10-CM

## 2021-06-10 DIAGNOSIS — E7800 Pure hypercholesterolemia, unspecified: Secondary | ICD-10-CM | POA: Diagnosis not present

## 2021-06-10 MED ORDER — ROSUVASTATIN CALCIUM 20 MG PO TABS: 20.0000 mg | ORAL_TABLET | Freq: Every day | ORAL | 3 refills | Status: AC

## 2021-06-10 NOTE — Patient Instructions (Signed)
Medication Instructions:  ?Your Physician recommend you continue on your current medication as directed.   ? ?*If you need a refill on your cardiac medications before your next appointment, please call your pharmacy* ? ? ?Lab Work: ?None ordered today ? ? ?Testing/Procedures: ?None ordered today ? ? ?Follow-Up: ?At [facility], you and your health needs are our priority.  As part of our continuing mission to provide you with exceptional heart care, we have created designated Provider Care Teams.  These Care Teams include your primary Cardiologist (physician) and Advanced Practice Providers (APPs -  Physician Assistants and Nurse Practitioners) who all work together to provide you with the care you need, when you need it. ? ?We recommend signing up for the patient portal called "MyChart".  Sign up information is provided on this After Visit Summary.  MyChart is used to connect with patients for Virtual Visits (Telemedicine).  Patients are able to view lab/test results, encounter notes, upcoming appointments, etc.  Non-urgent messages can be sent to your provider as well.   ?To learn more about what you can do with MyChart, go to [email or web address].   ? ?Your next appointment:   ?6 month(s) ? ?The format for your next appointment:   ?In Person ? ?Provider:   ?Buford Dresser, MD{ ? ? ? ?

## 2021-06-10 NOTE — Progress Notes (Signed)
Cardiology Office Note:    Date:  06/10/2021   ID:  Gerald [name], DOB 1940/07/30, MRN [id number]  PCP:  [name], DO  Cardiologist:  Buford Dresser, MD  Referring MD: [name], DO   CC: follow up  History of Present Illness:    Gerald [name] is a 81 y.o. 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 Julien Nordmann, who is seen for follow up today. I initially met him 01/26/20 as a new consult at the request of [name], DO for the evaluation and management of bradycardia.  From [facility]:  Echo dated 01/11/20 has an incomplete report in the scanned records. Per Care Everywhere, summary was a technically difficult study, no effusion, globally decreased EF (reported at 40%), no significant valve disease.  Note from 11/29/19 from Dr. Carlis Abbott notes ECG with second degree AV block, type 1. Pulse charted as 50 bpm. Planned for ambulatory monitor and echo.   Bio[employer] monitor result report scanned. Notes 6 days of use. Per report, most severe heart block was first degree AV block, slowest rate during this was 63 bpm. Did have PAC burden of 37%, PVC burden <1%, one episode of NSVT at 126 bpm (4 beats).   Cardiac management: bradycardia limits use of beta blocker and hypotension/lightheadedness limits ACEi/ARB/ARNI/MRA. He is taking aspirin 81 mg daily without issue. Tolerating rosuvastatin.  Today: He is accompanied by his wife. He is doing well with the cancer treatments. In 9 treatments, he only had one episode of feeling poorly that occurred after he ate an orange. He has one more treatment and starts immunotherapy afterwards.   He was in the hospital with pneumonia for a week after his first round of chemotherapy. He was unable to walk without his oxygen saturation dropping significantly. Recently, his oxygen saturation at home ranges from 94% to 99%.  He has a blister on his  R-hand from a cancer removed in the past. His wife reports he will occasionally hit it against something which causes it to bleed.  He takes Crestor at night. His appetite is good.  Denies chest pain, shortness of breath at rest or with normal exertion. No PND, orthopnea, LE edema or unexpected weight gain. No syncope or palpitations.  Past Medical History:  Diagnosis Date   Bradycardia    Bursitis    Carotid artery disease (HCC)    COPD (chronic obstructive pulmonary disease) (HCC)    Coronary artery disease    CTO mid-dist CX (fills from left-to-left collaterals), 99% OM2, 30% ost-dist LM, 30% prox-mid LAD, 30% ost-mid CX, 99% prox RCA (too small for PCI), medical management 02/09/20   Hypercholesterolemia    Ischemic cardiomyopathy    Lung cancer ([place])    Lung nodule    nscl ca dx'd 08/2019   Psoriasis    Tobacco abuse    Wears glasses    reading    Past Surgical History:  Procedure Laterality Date   BRONCHIAL BIOPSY  09/27/2019   Procedure: BRONCHIAL BIOPSIES;  Surgeon: Garner Nash, DO;  Location: [facility];  Service: Pulmonary;;   BRONCHIAL BRUSHINGS  09/27/2019   Procedure: BRONCHIAL BRUSHINGS;  Surgeon: Garner Nash, DO;  Location: [facility];  Service: Pulmonary;;   BRONCHIAL NEEDLE ASPIRATION BIOPSY  09/27/2019   Procedure: BRONCHIAL NEEDLE ASPIRATION BIOPSIES;  Surgeon: Garner Nash, DO;  Location: [facility];  Service: Pulmonary;;   BRONCHIAL NEEDLE  ASPIRATION BIOPSY  03/29/2021   Procedure: BRONCHIAL NEEDLE ASPIRATION BIOPSIES;  Surgeon: Garner Nash, DO;  Location: [facility];  Service: Pulmonary;;   BRONCHIAL WASHINGS  09/27/2019   Procedure: BRONCHIAL WASHINGS;  Surgeon: Garner Nash, DO;  Location: [facility];  Service: Pulmonary;;   BRONCHIAL WASHINGS  03/29/2021   Procedure: BRONCHIAL WASHINGS;  Surgeon: Garner Nash, DO;  Location: [facility];  Service: Pulmonary;;   COLONOSCOPY     CRYOTHERAPY  09/27/2019   Procedure:  Cydney Ok;  Surgeon: Garner Nash, DO;  Location: [facility];  Service: Pulmonary;;   CRYOTHERAPY  03/29/2021   Procedure: CRYOTHERAPY;  Surgeon: Garner Nash, DO;  Location: [facility];  Service: Pulmonary;;   FIDUCIAL MARKER PLACEMENT  09/27/2019   Procedure: FIDUCIAL MARKER PLACEMENT;  Surgeon: Garner Nash, DO;  Location: [facility];  Service: Pulmonary;;   HEMOSTASIS CONTROL  09/27/2019   Procedure: HEMOSTASIS CONTROL;  Surgeon: Garner Nash, DO;  Location: [facility];  Service: Pulmonary;;   HERNIA REPAIR     umbicial- no longer as a "belly button"   IR IMAGING GUIDED PORT INSERTION  05/21/2021   IR US GUIDE VASC ACCESS LEFT  05/21/2021   RIGHT/LEFT HEART CATH AND CORONARY ANGIOGRAPHY N/A 02/09/2020   Procedure: RIGHT/LEFT HEART CATH AND CORONARY ANGIOGRAPHY;  Surgeon: Burnell Blanks, MD;  Location: [facility];  Service: Cardiovascular;  Laterality: N/A;   VIDEO BRONCHOSCOPY WITH ENDOBRONCHIAL NAVIGATION N/A 09/27/2019   Procedure: VIDEO BRONCHOSCOPY WITH ENDOBRONCHIAL NAVIGATION;  Surgeon: Garner Nash, DO;  Location: [facility];  Service: Pulmonary;  Laterality: N/A;   VIDEO BRONCHOSCOPY WITH ENDOBRONCHIAL ULTRASOUND N/A 03/29/2021   Procedure: VIDEO BRONCHOSCOPY WITH ENDOBRONCHIAL ULTRASOUND;  Surgeon: Garner Nash, DO;  Location: [facility];  Service: Pulmonary;  Laterality: N/A;    Current Medications: Current Outpatient Medications on File Prior to Visit  Medication Sig   albuterol (VENTOLIN HFA) 108 (90 Base) MCG/ACT inhaler Inhale 2 puffs into the lungs every 6 (six) hours as needed for wheezing or shortness of breath.   AMBULATORY NON FORMULARY MEDICATION Please provide 4 wheel rolling walker with seat for [employer].  Diagnosis: R26.81 Gait instability   aspirin EC 81 MG tablet Take 1 tablet (81 mg total) by mouth daily. Swallow whole.   clobetasol cream (TEMOVATE) 5.63 % Apply 1 application topically daily as needed  (Psoriasis).   Fluticasone-Umeclidin-Vilant (TRELEGY ELLIPTA) 100-62.5-25 MCG/ACT AEPB Inhale 1 puff into the lungs daily.   lidocaine-prilocaine (EMLA) cream Apply to the Port-A-Cath site 30-60-minute before chemotherapy.   Multiple Vitamins-Minerals (PRESERVISION AREDS 2 PO) Take 1 tablet by mouth in the morning and at bedtime.   niacinamide 500 MG tablet Take 500 mg by mouth daily with breakfast.   nicotine (NICODERM CQ - DOSED IN MG/24 HOURS) 21 mg/24hr patch Place 1 patch (21 mg total) onto the skin daily.   prochlorperazine (COMPAZINE) 10 MG tablet Take 1 tablet (10 mg total) by mouth every 6 (six) hours as needed.   tamsulosin (FLOMAX) 0.4 MG CAPS capsule Take 0.4 mg by mouth.   triamcinolone cream (KENALOG) 0.1 % Apply 1 application topically daily as needed (skin irritation.).   vitamin B-12 (CYANOCOBALAMIN) 500 MCG tablet Take 500 mcg by mouth every evening.   No current facility-administered medications on file prior to visit.     Allergies:   Cosela [trilaciclib]   Social History   Tobacco Use   Smoking status: Every Day    Packs/day: 2.00  Years: 62.00    Pack years: 124.00    Types: Cigarettes    Passive exposure: Past   Smokeless tobacco: Never   Tobacco comments:    Pt currently using nicotine patch.  Vaping Use   Vaping Use: Never used  Substance Use Topics   Alcohol use: Not Currently    Comment: rare   Drug use: Never    Family History: family history includes Heart attack in his father; Lung cancer in his brother; Mesothelioma (age of onset: 26) in his sister.  ROS:   Please see the history of present illness.  Additional pertinent ROS otherwise unremarkable.    EKGs/Labs/Other Studies Reviewed:    The following studies were reviewed today: CT Chest 04/24/21 IMPRESSION: 1. Today's study demonstrates definitive progression of disease with enlargement of the right upper lobe mass which again appears to invade the right chest wall and anterior  mediastinum, worsening right hilar and mediastinal lymphadenopathy, enlarging thick-walled presumably malignant right pleural effusion, and interval development of what appears to be lymphangitic spread of tumor throughout the right lung, as detailed above. 2. Aortic atherosclerosis, in addition to left main and three-vessel coronary artery disease. 3. Cholelithiasis. 4. Colonic diverticulosis. 5. Additional incidental findings, as above.   Aortic Atherosclerosis (ICD10-I70.0).  Outside testing summarized in HPI Cardiac cath 02/09/20 Mid Cx to Dist Cx lesion is 100% stenosed. 2nd Mrg lesion is 99% stenosed. Ost LM to Dist LM lesion is 30% stenosed. Prox LAD to Mid LAD lesion is 30% stenosed. Ost Cx to Mid Cx lesion is 30% stenosed. Prox RCA lesion is 99% stenosed.   1. The LAD is a large caliber vessel that courses to the apex. Diffuse mild calcified disease without any focally obstructive lesions.  2. The Circumflex is a large dominant vessel. There is heavy calcification in the proximal and mid vessel. The mid Circumflex is chronically occluded just beyond a small caliber obtuse marginal branch. The left sided PDA fills from left to left collaterals.  3. The small non-dominant RCA has severe diffuse proximal and mid vessel disease. This non-dominant vessel is too small for PCI.  4. Normal right and left heart pressures   Recommendations: Medical management of CAD. His mid Circumflex is chronically occluded and fills briskly from left to left collaterals. The RCA is a small non-dominant vessel with severe diffuse proximal and mid disease. Could consider viability study to assess the lateral wall and if viable myocardium, review his films with the CTO team to consider PCI of the Circumflex. He seems to be asymptomatic at this time so the best approach may be medical management of his CAD without an attempt at PCI.   EKG:  EKG personally reviewed today 3.13.23: ECG was not ordered  today 12/06/20 SR/SA at 90 bpm with 1st degree AV block 01/26/20 SR with SA at 85 bpm  Recent Labs: 04/27/2021: B Natriuretic Peptide 99.3 04/30/2021: Magnesium 2.1 05/28/2021: TSH 1.377 06/04/2021: ALT 11; BUN 26; Creatinine 0.85; Hemoglobin 11.5; Platelet Count 171; Potassium 4.2; Sodium 137  Recent Lipid Panel    Component Value Date/Time   CHOL 128 03/18/2021 0000   TRIG 179 (H) 03/18/2021 0000   HDL 41 03/18/2021 0000   CHOLHDL 3.1 03/18/2021 0000   LDLCALC 61 03/18/2021 0000    Physical Exam:    VS:  BP 115/60    Pulse 95    Ht 6' (1.829 m)    Wt 195 lb 3.2 oz (88.5 kg)    SpO2 98%  BMI 26.47 kg/m     Wt Readings from Last 3 Encounters:  06/10/21 195 lb 3.2 oz (88.5 kg)  05/28/21 197 lb 8 oz (89.6 kg)  05/09/21 209 lb (94.8 kg)    GEN: Well nourished, well developed in no acute distress HEENT: Normal, moist mucous membranes NECK: No JVD CARDIAC: regular rhythm, normal S1 and S2, no rubs or gallops. Soft systolic murmur. VASCULAR: Radial and DP pulses 2+ bilaterally. No carotid bruits RESPIRATORY:  Clear to auscultation without rales, wheezing or rhonchi  ABDOMEN: Soft, non-tender, non-distended MUSCULOSKELETAL:  Ambulates independently SKIN: Warm and dry, no edema, R-hand blister NEUROLOGIC:  Alert and oriented x 3. No focal neuro deficits noted. PSYCHIATRIC:  Normal affect    ASSESSMENT:    1. Coronary artery disease due to calcified coronary lesion   2. Ischemic cardiomyopathy   3. Pure hypercholesterolemia   4. History of bradycardia   5. Counseling on health promotion and disease prevention      PLAN:    Coronary artery disease, with CTO and collaterals as above Ischemic cardiomyopathy Hypercholesterolemia, goal LDL <70 -tolerating aspirin, continue -tolerating rosuvastatin 20 mg daily. LFTs have normalized, they have restarted rosuvastatin without issues -no beta blocker given bradycardia history -no blood pressure room for  ACEi/ARB/ARNI/MRA -asymptomatic -counseled on signs of heart failure, what to watch for, daily weights, etc -he has general fatigue, of unclear etiology. He appears euvolemic. Discussed heart failure education, signs/symptoms that need immediate medical attention.  Bradycardia: based on monitor results, no high degree block. Avoid AV nodal agents. Asymptomatic  Extensive stage small cell carcinoma: on palliative chemotherapy under the care of Dr. Julien Nordmann. Tolerating very well, appetite is excellent.  Cardiac risk counseling and prevention recommendations: -recommend heart healthy/Mediterranean diet, with whole grains, fruits, vegetable, fish, lean meats, nuts, and olive oil. Limit salt. -recommend moderate walking, 3-5 times/week for 30-50 minutes each session. Aim for at least 150 minutes.week. Goal should be pace of 3 miles/hours, or walking 1.5 miles in 30 minutes -recommend avoidance of tobacco products. Avoid excess alcohol.  Plan for follow up: 6 months or sooner as needed  Buford Dresser, MD, PhD [place]   [facility]   Medication Adjustments/Labs and Tests Ordered: Current medicines are reviewed at length with the patient today.  Concerns regarding medicines are outlined above.  No orders of the defined types were placed in this encounter.   Meds ordered this encounter  Medications   rosuvastatin (CRESTOR) 20 MG tablet    Sig: Take 1 tablet (20 mg total) by mouth daily.    Dispense:  90 tablet    Refill:  3   I,Mykaella Javier,acting as a scribe for [employer], MD.,have documented all relevant documentation on the behalf of Buford Dresser, MD,as directed by  Buford Dresser, MD while in the presence of Buford Dresser, MD.  I, Buford Dresser, MD, have reviewed all documentation for this visit. The documentation on 06/10/21 for the exam, diagnosis, procedures, and orders are all accurate and complete.   Patient Instructions   Medication Instructions:  Your Physician recommend you continue on your current medication as directed.    *If you need a refill on your cardiac medications before your next appointment, please call your pharmacy*   Lab Work: None ordered today   Testing/Procedures: None ordered today   Follow-Up: At [facility], you and your health needs are our priority.  As part of our continuing mission to provide you with exceptional heart care, we have created designated Provider  Care Teams.  These Care Teams include your primary Cardiologist (physician) and Advanced Practice Providers (APPs -  Physician Assistants and Nurse Practitioners) who all work together to provide you with the care you need, when you need it.  We recommend signing up for the patient portal called "MyChart".  Sign up information is provided on this After Visit Summary.  MyChart is used to connect with patients for Virtual Visits (Telemedicine).  Patients are able to view lab/test results, encounter notes, upcoming appointments, etc.  Non-urgent messages can be sent to your provider as well.   To learn more about what you can do with MyChart, go to [email or web address].    Your next appointment:   6 month(s)  The format for your next appointment:   In Person  Provider:   Buford Dresser, MD{     Signed, Buford Dresser, MD PhD 06/10/2021  [place]

## 2021-06-11 ENCOUNTER — Other Ambulatory Visit: Payer: Medicare Other

## 2021-06-11 ENCOUNTER — Inpatient Hospital Stay: Payer: Medicare Other

## 2021-06-11 DIAGNOSIS — C3490 Malignant neoplasm of unspecified part of unspecified bronchus or lung: Secondary | ICD-10-CM

## 2021-06-11 DIAGNOSIS — R5383 Other fatigue: Secondary | ICD-10-CM

## 2021-06-11 DIAGNOSIS — Z5111 Encounter for antineoplastic chemotherapy: Secondary | ICD-10-CM | POA: Diagnosis not present

## 2021-06-11 DIAGNOSIS — Z95828 Presence of other vascular implants and grafts: Secondary | ICD-10-CM

## 2021-06-11 LAB — CMP (CANCER CENTER ONLY)
ALT: 8 U/L (ref 0–44)
AST: 10 U/L — ABNORMAL LOW (ref 15–41)
Albumin: 3.9 g/dL (ref 3.5–5.0)
Alkaline Phosphatase: 64 U/L (ref 38–126)
Anion gap: 8 (ref 5–15)
BUN: 14 mg/dL (ref 8–23)
CO2: 26 mmol/L (ref 22–32)
Calcium: 9.2 mg/dL (ref 8.9–10.3)
Chloride: 103 mmol/L (ref 98–111)
Creatinine: 0.92 mg/dL (ref 0.61–1.24)
GFR, Estimated: >60 mL/min (ref >60)
Glucose, Bld: 102 mg/dL — ABNORMAL HIGH (ref 70–99)
Potassium: 4 mmol/L (ref 3.5–5.1)
Sodium: 137 mmol/L (ref 135–145)
Total Bilirubin: 0.6 mg/dL (ref 0.3–1.2)
Total Protein: 6.4 g/dL — ABNORMAL LOW (ref 6.5–8.1)

## 2021-06-11 LAB — CBC WITH DIFFERENTIAL (CANCER CENTER ONLY)
Abs Immature Granulocytes: 0 10*3/uL (ref 0.00–0.07)
Basophils Absolute: 0.1 10*3/uL (ref 0.0–0.1)
Basophils Relative: 1 %
Eosinophils Absolute: 0.1 10*3/uL (ref 0.0–0.5)
Eosinophils Relative: 3 %
HCT: 32.5 % — ABNORMAL LOW (ref 39.0–52.0)
Hemoglobin: 10.7 g/dL — ABNORMAL LOW (ref 13.0–17.0)
Immature Granulocytes: 0 %
Lymphocytes Relative: 22 %
Lymphs Abs: 0.8 10*3/uL (ref 0.7–4.0)
MCH: 31 pg (ref 26.0–34.0)
MCHC: 32.9 g/dL (ref 30.0–36.0)
MCV: 94.2 fL (ref 80.0–100.0)
Monocytes Absolute: 0.7 10*3/uL (ref 0.1–1.0)
Monocytes Relative: 20 %
Neutro Abs: 1.9 10*3/uL (ref 1.7–7.7)
Neutrophils Relative %: 54 %
Platelet Count: 66 10*3/uL — ABNORMAL LOW (ref 150–400)
RBC: 3.45 MIL/uL — ABNORMAL LOW (ref 4.22–5.81)
RDW: 15.4 % (ref 11.5–15.5)
WBC Count: 3.6 10*3/uL — ABNORMAL LOW (ref 4.0–10.5)
nRBC: 0 % (ref 0.0–0.2)

## 2021-06-11 LAB — TSH: TSH: 1.311 u[IU]/mL (ref 0.320–4.118)

## 2021-06-11 MED ORDER — HEPARIN SOD (PORK) LOCK FLUSH 100 UNIT/ML IV SOLN
500.0000 [IU] | Freq: Once | INTRAVENOUS | Status: AC
  Administered 2021-06-11: 500 [IU]

## 2021-06-11 MED ORDER — SODIUM CHLORIDE 0.9% FLUSH
10.0000 mL | Freq: Once | INTRAVENOUS | Status: AC
  Administered 2021-06-11: 10 mL

## 2021-06-17 NOTE — Progress Notes (Addendum)
[facility] ?OFFICE PROGRESS NOTE ? ?Gerald Nutting, Gerald [name] ?[address] ?[place] [address] ? ?DIAGNOSIS:  Extensive stage (T2 a, N2, M1b) small cell lung cancer that was initially diagnosed as synchronous stage Ia non-small cell lung cancer, squamous cell carcinoma involving right upper lobe pulmonary nodule in addition to right lower lobe endobronchial lesion diagnosed in June 2021.  The patient has evidence for disease recurrence and metastasis in December 2022 ? ?PRIOR THERAPY: SBRT to the right upper lobe pulmonary nodule as well as the right lower lobe pulmonary nodule under the care of Dr. Sondra Come completed on 11/14/2019. ? ?CURRENT THERAPY:  Palliative systemic chemotherapy with carboplatin for an AUC of 5, etoposide 100 mg/m2, and Imfinzi 1500 mg IV every 3 weeks with Cosela.  First dose expected on 04/16/21. Status post 3 cycle. Dose reduced to carboplatin for an AUC of 4 and etoposide 80 mg/m2 starting from cycle #1.  ? ?INTERVAL HISTORY: ?Gerald [name] 80 y.o. male returns to the clinic today for a follow-up visit accompanied by his wife.  The patient is currently undergoing treatment for his newly diagnosed small cell lung cancer.  He has been tolerating treatment well.  He denies any fever, chills, or night sweats.  His weight has stayed stable and he has continued to have an appetite. He denies any significant dyspnea on exertion. He denies significant cough. Denies any chest pain or hemoptysis.  Denies any nausea, vomiting, diarrhea, or constipation.  Denies any headache or visual changes. He has baseline rashes for psoriasis. The patient is here today for evaluation and repeat blood work before starting cycle #4. ? ? ? ? ?MEDICAL HISTORY: ?Past Medical History:  ?Diagnosis Date  ? Bradycardia   ? Bursitis   ? Carotid artery disease ([facility])   ? COPD (chronic obstructive pulmonary disease) ([facility])   ? Coronary artery disease   ? CTO mid-dist CX (fills from left-to-left  collaterals), 99% OM2, 30% ost-dist LM, 30% prox-mid LAD, 30% ost-mid CX, 99% prox RCA (too small for PCI), medical management 02/09/20  ? Hypercholesterolemia   ? Ischemic cardiomyopathy   ? Lung cancer ([facility])   ? Lung nodule   ? nscl ca dx'd 08/2019  ? Psoriasis   ? Tobacco abuse   ? Wears glasses   ? reading  ? ? ?ALLERGIES:  is allergic to cosela [trilaciclib]. ? ?MEDICATIONS:  ?Current Outpatient Medications  ?Medication Sig Dispense Refill  ? albuterol (VENTOLIN HFA) 108 (90 Base) MCG/ACT inhaler Inhale 2 puffs into the lungs every 6 (six) hours as needed for wheezing or shortness of breath. 8 g 2  ? AMBULATORY NON FORMULARY MEDICATION Please provide 4 wheel rolling walker with seat for [employer].  Diagnosis: R26.81 Gait instability 1 Device 0  ? aspirin EC 81 MG tablet Take 1 tablet (81 mg total) by mouth daily. Swallow whole. 90 tablet 3  ? clobetasol cream (TEMOVATE) 7.82 % Apply 1 application topically daily as needed (Psoriasis).    ? Fluticasone-Umeclidin-Vilant (TRELEGY ELLIPTA) 100-62.5-25 MCG/ACT AEPB Inhale 1 puff into the lungs daily. 60 each 0  ? lidocaine-prilocaine (EMLA) cream Apply to the Port-A-Cath site 30-60-minute before chemotherapy. 30 g 0  ? Multiple Vitamins-Minerals (PRESERVISION AREDS 2 PO) Take 1 tablet by mouth in the morning and at bedtime.    ? niacinamide 500 MG tablet Take 500 mg by mouth daily with breakfast.    ? nicotine (NICODERM CQ - DOSED IN MG/24 HOURS) 21 mg/24hr patch Place  1 patch (21 mg total) onto the skin daily. 28 patch 0  ? prochlorperazine (COMPAZINE) 10 MG tablet Take 1 tablet (10 mg total) by mouth every 6 (six) hours as needed. 30 tablet 2  ? rosuvastatin (CRESTOR) 20 MG tablet Take 1 tablet (20 mg total) by mouth daily. 90 tablet 3  ? tamsulosin (FLOMAX) 0.4 MG CAPS capsule Take 0.4 mg by mouth.    ? triamcinolone cream (KENALOG) 0.1 % Apply 1 application topically daily as needed (skin irritation.).    ? vitamin B-12 (CYANOCOBALAMIN) 500 MCG tablet Take 500  mcg by mouth every evening.    ? ?No current facility-administered medications for this visit.  ? ? ?SURGICAL HISTORY:  ?Past Surgical History:  ?Procedure Laterality Date  ? BRONCHIAL BIOPSY  09/27/2019  ? Procedure: BRONCHIAL BIOPSIES;  Surgeon: Garner Nash, Gerald [name];  Location: [facility];  Service: Pulmonary;;  ? BRONCHIAL BRUSHINGS  09/27/2019  ? Procedure: BRONCHIAL BRUSHINGS;  Surgeon: Garner Nash, Gerald [name];  Location: [facility];  Service: Pulmonary;;  ? BRONCHIAL NEEDLE ASPIRATION BIOPSY  09/27/2019  ? Procedure: BRONCHIAL NEEDLE ASPIRATION BIOPSIES;  Surgeon: Garner Nash, Gerald [name];  Location: [facility];  Service: Pulmonary;;  ? BRONCHIAL NEEDLE ASPIRATION BIOPSY  03/29/2021  ? Procedure: BRONCHIAL NEEDLE ASPIRATION BIOPSIES;  Surgeon: Garner Nash, Gerald [name];  Location: [facility];  Service: Pulmonary;;  ? BRONCHIAL WASHINGS  09/27/2019  ? Procedure: BRONCHIAL WASHINGS;  Surgeon: Garner Nash, Gerald [name];  Location: [facility];  Service: Pulmonary;;  ? BRONCHIAL WASHINGS  03/29/2021  ? Procedure: BRONCHIAL WASHINGS;  Surgeon: Garner Nash, Gerald [name];  Location: [facility];  Service: Pulmonary;;  ? COLONOSCOPY    ? CRYOTHERAPY  09/27/2019  ? Procedure: CRYOTHERAPY;  Surgeon: Garner Nash, Gerald [name];  Location: [facility];  Service: Pulmonary;;  ? CRYOTHERAPY  03/29/2021  ? Procedure: CRYOTHERAPY;  Surgeon: Garner Nash, Gerald [name];  Location: [facility];  Service: Pulmonary;;  ? FIDUCIAL MARKER PLACEMENT  09/27/2019  ? Procedure: FIDUCIAL MARKER PLACEMENT;  Surgeon: Garner Nash, Gerald [name];  Location: [facility];  Service: Pulmonary;;  ? HEMOSTASIS CONTROL  09/27/2019  ? Procedure: HEMOSTASIS CONTROL;  Surgeon: Garner Nash, Gerald [name];  Location: [facility];  Service: Pulmonary;;  ? HERNIA REPAIR    ? umbicial- no longer as a "belly button"  ? IR IMAGING GUIDED PORT INSERTION  05/21/2021  ? IR US GUIDE VASC ACCESS LEFT  05/21/2021  ? RIGHT/LEFT HEART CATH AND CORONARY ANGIOGRAPHY N/A 02/09/2020  ? Procedure:  RIGHT/LEFT HEART CATH AND CORONARY ANGIOGRAPHY;  Surgeon: Burnell Blanks, MD;  Location: [facility];  Service: Cardiovascular;  Laterality: N/A;  ? VIDEO BRONCHOSCOPY WITH ENDOBRONCHIAL NAVIGATION N/A 09/27/2019  ? Procedure: VIDEO BRONCHOSCOPY WITH ENDOBRONCHIAL NAVIGATION;  Surgeon: Garner Nash, Gerald [name];  Location: [facility];  Service: Pulmonary;  Laterality: N/A;  ? VIDEO BRONCHOSCOPY WITH ENDOBRONCHIAL ULTRASOUND N/A 03/29/2021  ? Procedure: VIDEO BRONCHOSCOPY WITH ENDOBRONCHIAL ULTRASOUND;  Surgeon: Garner Nash, Gerald [name];  Location: [facility];  Service: Pulmonary;  Laterality: N/A;  ? ? ?REVIEW OF SYSTEMS:   ?Review of Systems  ?Constitutional: Negative for appetite change, chills, fatigue, fever and unexpected weight change.  ?HENT:   Negative for mouth sores, nosebleeds, sore throat and trouble swallowing.   ?Eyes: Negative for eye problems and icterus.  ?Respiratory: Negative for cough, hemoptysis, shortness of breath and wheezing.   ?Cardiovascular: Negative for chest pain and leg swelling.  ?Gastrointestinal: Negative for abdominal pain, constipation, diarrhea, nausea and vomiting.  ?Genitourinary: Negative for  bladder incontinence, difficulty urinating, dysuria, frequency and hematuria.   ?Musculoskeletal: Negative for back pain, gait problem, neck pain and neck stiffness.  ?Skin: Positive for psoriasis.  ?Neurological: Negative for dizziness, extremity weakness, gait problem, headaches, light-headedness and seizures.  ?Hematological: Negative for adenopathy. Does not bruise/bleed easily.  ?Psychiatric/Behavioral: Negative for confusion, depression and sleep disturbance. The patient is not nervous/anxious.   ? ? ?PHYSICAL EXAMINATION:  ?Blood pressure 108/67, pulse 98, temperature 97.8 ?F (36.6 ?C), temperature source Oral, resp. rate 18, height 6' (1.829 m), weight 197 lb 1.6 oz (89.4 kg), SpO2 99 %. ? ?ECOG PERFORMANCE STATUS: 1 ? ?Physical Exam  ?Constitutional: Oriented to  person, place, and time and well-developed, well-nourished, and in no distress.  ?HENT:  ?Head: Normocephalic and atraumatic.  ?Mouth/Throat: Oropharynx is clear and moist. No oropharyngeal exudate.  ?Eyes: Conju

## 2021-06-18 ENCOUNTER — Inpatient Hospital Stay: Payer: Medicare Other

## 2021-06-18 ENCOUNTER — Other Ambulatory Visit: Payer: Self-pay

## 2021-06-18 ENCOUNTER — Inpatient Hospital Stay (HOSPITAL_BASED_OUTPATIENT_CLINIC_OR_DEPARTMENT_OTHER): Payer: Medicare Other | Admitting: Physician Assistant

## 2021-06-18 ENCOUNTER — Other Ambulatory Visit: Payer: Medicare Other

## 2021-06-18 VITALS — BP 108/67 | HR 98 | Temp 97.8°F | Resp 18 | Ht 72.0 in | Wt 197.1 lb

## 2021-06-18 DIAGNOSIS — C3411 Malignant neoplasm of upper lobe, right bronchus or lung: Secondary | ICD-10-CM

## 2021-06-18 DIAGNOSIS — C3490 Malignant neoplasm of unspecified part of unspecified bronchus or lung: Secondary | ICD-10-CM

## 2021-06-18 DIAGNOSIS — Z5112 Encounter for antineoplastic immunotherapy: Secondary | ICD-10-CM

## 2021-06-18 DIAGNOSIS — Z5111 Encounter for antineoplastic chemotherapy: Secondary | ICD-10-CM

## 2021-06-18 DIAGNOSIS — Z95828 Presence of other vascular implants and grafts: Secondary | ICD-10-CM

## 2021-06-18 LAB — CMP (CANCER CENTER ONLY)
ALT: 9 U/L (ref 0–44)
AST: 12 U/L — ABNORMAL LOW (ref 15–41)
Albumin: 4 g/dL (ref 3.5–5.0)
Alkaline Phosphatase: 62 U/L (ref 38–126)
Anion gap: 6 (ref 5–15)
BUN: 21 mg/dL (ref 8–23)
CO2: 29 mmol/L (ref 22–32)
Calcium: 9.4 mg/dL (ref 8.9–10.3)
Chloride: 105 mmol/L (ref 98–111)
Creatinine: 0.94 mg/dL (ref 0.61–1.24)
GFR, Estimated: >60 mL/min (ref >60)
Glucose, Bld: 103 mg/dL — ABNORMAL HIGH (ref 70–99)
Potassium: 4.2 mmol/L (ref 3.5–5.1)
Sodium: 140 mmol/L (ref 135–145)
Total Bilirubin: 0.4 mg/dL (ref 0.3–1.2)
Total Protein: 7 g/dL (ref 6.5–8.1)

## 2021-06-18 LAB — CBC WITH DIFFERENTIAL (CANCER CENTER ONLY)
Abs Immature Granulocytes: 0.09 10*3/uL — ABNORMAL HIGH (ref 0.00–0.07)
Basophils Absolute: 0.1 10*3/uL (ref 0.0–0.1)
Basophils Relative: 2 %
Eosinophils Absolute: 0.2 10*3/uL (ref 0.0–0.5)
Eosinophils Relative: 4 %
HCT: 35.2 % — ABNORMAL LOW (ref 39.0–52.0)
Hemoglobin: 11.4 g/dL — ABNORMAL LOW (ref 13.0–17.0)
Immature Granulocytes: 2 %
Lymphocytes Relative: 25 %
Lymphs Abs: 1 10*3/uL (ref 0.7–4.0)
MCH: 30.8 pg (ref 26.0–34.0)
MCHC: 32.4 g/dL (ref 30.0–36.0)
MCV: 95.1 fL (ref 80.0–100.0)
Monocytes Absolute: 0.8 10*3/uL (ref 0.1–1.0)
Monocytes Relative: 21 %
Neutro Abs: 1.8 10*3/uL (ref 1.7–7.7)
Neutrophils Relative %: 46 %
Platelet Count: 212 10*3/uL (ref 150–400)
RBC: 3.7 MIL/uL — ABNORMAL LOW (ref 4.22–5.81)
RDW: 16.5 % — ABNORMAL HIGH (ref 11.5–15.5)
WBC Count: 3.8 10*3/uL — ABNORMAL LOW (ref 4.0–10.5)
nRBC: 0.8 % — ABNORMAL HIGH (ref 0.0–0.2)

## 2021-06-18 MED ORDER — SODIUM CHLORIDE 0.9 % IV SOLN
1500.0000 mg | Freq: Once | INTRAVENOUS | Status: AC
  Administered 2021-06-18: 1500 mg via INTRAVENOUS
  Filled 2021-06-18: qty 30

## 2021-06-18 MED ORDER — SODIUM CHLORIDE 0.9 % IV SOLN
10.0000 mg | Freq: Once | INTRAVENOUS | Status: AC
  Administered 2021-06-18: 10 mg via INTRAVENOUS
  Filled 2021-06-18: qty 10

## 2021-06-18 MED ORDER — SODIUM CHLORIDE 0.9 % IV SOLN
386.8000 mg | Freq: Once | INTRAVENOUS | Status: AC
  Administered 2021-06-18: 390 mg via INTRAVENOUS
  Filled 2021-06-18: qty 39

## 2021-06-18 MED ORDER — FAMOTIDINE IN NACL 20-0.9 MG/50ML-% IV SOLN
20.0000 mg | Freq: Once | INTRAVENOUS | Status: AC
  Administered 2021-06-18: 20 mg via INTRAVENOUS
  Filled 2021-06-18: qty 50

## 2021-06-18 MED ORDER — PALONOSETRON HCL INJECTION 0.25 MG/5ML
0.2500 mg | Freq: Once | INTRAVENOUS | Status: AC
  Administered 2021-06-18: 0.25 mg via INTRAVENOUS
  Filled 2021-06-18: qty 5

## 2021-06-18 MED ORDER — SODIUM CHLORIDE 0.9 % IV SOLN
150.0000 mg | Freq: Once | INTRAVENOUS | Status: AC
  Administered 2021-06-18: 150 mg via INTRAVENOUS
  Filled 2021-06-18: qty 150

## 2021-06-18 MED ORDER — SODIUM CHLORIDE 0.9% FLUSH
10.0000 mL | Freq: Once | INTRAVENOUS | Status: AC
  Administered 2021-06-18: 10 mL

## 2021-06-18 MED ORDER — SODIUM CHLORIDE 0.9 % IV SOLN
80.0000 mg/m2 | Freq: Once | INTRAVENOUS | Status: AC
  Administered 2021-06-18: 170 mg via INTRAVENOUS
  Filled 2021-06-18: qty 8.5

## 2021-06-18 MED ORDER — HEPARIN SOD (PORK) LOCK FLUSH 100 UNIT/ML IV SOLN
500.0000 [IU] | Freq: Once | INTRAVENOUS | Status: AC | PRN
  Administered 2021-06-18: 500 [IU]

## 2021-06-18 MED ORDER — SODIUM CHLORIDE 0.9% FLUSH
10.0000 mL | INTRAVENOUS | Status: DC | PRN
  Administered 2021-06-18: 10 mL

## 2021-06-18 MED ORDER — TRILACICLIB DIHYDROCHLORIDE INJECTION 300 MG
240.0000 mg/m2 | Freq: Once | INTRAVENOUS | Status: AC
  Administered 2021-06-18: 510 mg via INTRAVENOUS
  Filled 2021-06-18: qty 34

## 2021-06-18 MED ORDER — SODIUM CHLORIDE 0.9 % IV SOLN: Freq: Once | INTRAVENOUS | Status: AC

## 2021-06-18 NOTE — Patient Instructions (Signed)
[facility]  Discharge Instructions: ?Thank you for choosing [facility] to provide your oncology and hematology care.  ? ?If you have a lab appointment with the [facility], please go directly to the [facility] and check in at the registration area. ?  ?Wear comfortable clothing and clothing appropriate for easy access to any Portacath or PICC line.  ? ?We strive to give you quality time with your provider. You may need to reschedule your appointment if you arrive late (15 or more minutes).  Arriving late affects you and other patients whose appointments are after yours.  Also, if you miss three or more appointments without notifying the office, you may be dismissed from the clinic at the provider?s discretion.    ?  ?For prescription refill requests, have your pharmacy contact our office and allow 72 hours for refills to be completed.   ? ?Today you received the following chemotherapy and/or immunotherapy agents : Imfinzi, Carboplatin, Etoposide    ?  ?To help prevent nausea and vomiting after your treatment, we encourage you to take your nausea medication as directed. ? ?BELOW ARE SYMPTOMS THAT SHOULD BE REPORTED IMMEDIATELY: ?*FEVER GREATER THAN 100.4 F (38 ?C) OR HIGHER ?*CHILLS OR SWEATING ?*NAUSEA AND VOMITING THAT IS NOT CONTROLLED WITH YOUR NAUSEA MEDICATION ?*UNUSUAL SHORTNESS OF BREATH ?*UNUSUAL BRUISING OR BLEEDING ?*URINARY PROBLEMS (pain or burning when urinating, or frequent urination) ?*BOWEL PROBLEMS (unusual diarrhea, constipation, pain near the anus) ?TENDERNESS IN MOUTH AND THROAT WITH OR WITHOUT PRESENCE OF ULCERS (sore throat, sores in mouth, or a toothache) ?UNUSUAL RASH, SWELLING OR PAIN  ?UNUSUAL VAGINAL DISCHARGE OR ITCHING  ? ?Items with * indicate a potential emergency and should be followed up as soon as possible or go to the Emergency Department if any problems should occur. ? ?Please show the CHEMOTHERAPY ALERT CARD or IMMUNOTHERAPY  ALERT CARD at check-in to the Emergency Department and triage nurse. ? ?Should you have questions after your visit or need to cancel or reschedule your appointment, please contact [facility]  Dept: [phone]  and follow the prompts.  Office hours are 8:00 a.m. to 4:30 p.m. Monday - Friday. Please note that voicemails left after 4:00 p.m. may not be returned until the following business day.  We are closed weekends and major holidays. You have access to a nurse at all times for urgent questions. Please call the main number to the clinic Dept: [phone] and follow the prompts. ? ? ?For any non-urgent questions, you may also contact your provider using MyChart. We now offer e-Visits for anyone 60 and older to request care online for non-urgent symptoms. For details visit [email or web address]. ?  ?Also download the MyChart app! Go to the app store, search "MyChart", open the app, select [facility], and log in with your MyChart username and password. ? ?Due to Covid, a mask is required upon entering the hospital/clinic. If you do not have a mask, one will be given to you upon arrival. For doctor visits, patients may have 1 support person aged 40 or older with them. For treatment visits, patients cannot have anyone with them due to current Covid guidelines and our immunocompromised population.  ? ?

## 2021-06-19 ENCOUNTER — Other Ambulatory Visit: Payer: Self-pay

## 2021-06-19 ENCOUNTER — Inpatient Hospital Stay: Payer: Medicare Other

## 2021-06-19 ENCOUNTER — Telehealth: Payer: Self-pay | Admitting: Pulmonary Disease

## 2021-06-19 VITALS — BP 106/63 | HR 84 | Temp 97.8°F | Resp 18

## 2021-06-19 DIAGNOSIS — C3411 Malignant neoplasm of upper lobe, right bronchus or lung: Secondary | ICD-10-CM

## 2021-06-19 DIAGNOSIS — Z5111 Encounter for antineoplastic chemotherapy: Secondary | ICD-10-CM | POA: Diagnosis not present

## 2021-06-19 MED ORDER — SODIUM CHLORIDE 0.9 % IV SOLN
10.0000 mg | Freq: Once | INTRAVENOUS | Status: AC
  Administered 2021-06-19: 10 mg via INTRAVENOUS
  Filled 2021-06-19: qty 10

## 2021-06-19 MED ORDER — SODIUM CHLORIDE 0.9% FLUSH
10.0000 mL | INTRAVENOUS | Status: DC | PRN
  Administered 2021-06-19: 10 mL

## 2021-06-19 MED ORDER — FAMOTIDINE IN NACL 20-0.9 MG/50ML-% IV SOLN
20.0000 mg | Freq: Once | INTRAVENOUS | Status: AC
  Administered 2021-06-19: 20 mg via INTRAVENOUS
  Filled 2021-06-19: qty 50

## 2021-06-19 MED ORDER — HEPARIN SOD (PORK) LOCK FLUSH 100 UNIT/ML IV SOLN
500.0000 [IU] | Freq: Once | INTRAVENOUS | Status: AC | PRN
  Administered 2021-06-19: 500 [IU]

## 2021-06-19 MED ORDER — SODIUM CHLORIDE 0.9 % IV SOLN: Freq: Once | INTRAVENOUS | Status: AC

## 2021-06-19 MED ORDER — SODIUM CHLORIDE 0.9 % IV SOLN
80.0000 mg/m2 | Freq: Once | INTRAVENOUS | Status: AC
  Administered 2021-06-19: 170 mg via INTRAVENOUS
  Filled 2021-06-19: qty 8.5

## 2021-06-19 MED ORDER — TRILACICLIB DIHYDROCHLORIDE INJECTION 300 MG
240.0000 mg/m2 | Freq: Once | INTRAVENOUS | Status: AC
  Administered 2021-06-19: 510 mg via INTRAVENOUS
  Filled 2021-06-19: qty 34

## 2021-06-19 NOTE — Telephone Encounter (Signed)
Pt has not been seen for an appt since 10/26/19. Pt will need to be seen prior to [place] being able to fill his meds. ? ? ?Attempted to call [facility] but unable to reach. Left message for pt to return call. ? ? ?When call is returned, please make pt an appt with either APP or Dr. Valeta Harms. ?

## 2021-06-20 ENCOUNTER — Inpatient Hospital Stay: Payer: Medicare Other

## 2021-06-20 VITALS — BP 117/59 | HR 78 | Temp 97.7°F | Resp 18

## 2021-06-20 DIAGNOSIS — C3411 Malignant neoplasm of upper lobe, right bronchus or lung: Secondary | ICD-10-CM

## 2021-06-20 DIAGNOSIS — Z5111 Encounter for antineoplastic chemotherapy: Secondary | ICD-10-CM | POA: Diagnosis not present

## 2021-06-20 MED ORDER — SODIUM CHLORIDE 0.9% FLUSH: 10.0000 mL | INTRAVENOUS | Status: DC | PRN

## 2021-06-20 MED ORDER — TRELEGY ELLIPTA 100-62.5-25 MCG/ACT IN AEPB: 1.0000 | INHALATION_SPRAY | Freq: Every day | RESPIRATORY_TRACT | 11 refills | Status: AC

## 2021-06-20 MED ORDER — SODIUM CHLORIDE 0.9 % IV SOLN: Freq: Once | INTRAVENOUS | Status: AC

## 2021-06-20 MED ORDER — HEPARIN SOD (PORK) LOCK FLUSH 100 UNIT/ML IV SOLN: 500.0000 [IU] | Freq: Once | INTRAVENOUS | Status: DC | PRN

## 2021-06-20 MED ORDER — SODIUM CHLORIDE 0.9 % IV SOLN
80.0000 mg/m2 | Freq: Once | INTRAVENOUS | Status: AC
  Administered 2021-06-20: 170 mg via INTRAVENOUS
  Filled 2021-06-20: qty 8.5

## 2021-06-20 MED ORDER — SODIUM CHLORIDE 0.9 % IV SOLN
10.0000 mg | Freq: Once | INTRAVENOUS | Status: AC
  Administered 2021-06-20: 10 mg via INTRAVENOUS
  Filled 2021-06-20: qty 10

## 2021-06-20 MED ORDER — FAMOTIDINE IN NACL 20-0.9 MG/50ML-% IV SOLN
20.0000 mg | Freq: Once | INTRAVENOUS | Status: AC
  Administered 2021-06-20: 20 mg via INTRAVENOUS
  Filled 2021-06-20: qty 50

## 2021-06-20 MED ORDER — TRILACICLIB DIHYDROCHLORIDE INJECTION 300 MG
240.0000 mg/m2 | Freq: Once | INTRAVENOUS | Status: AC
  Administered 2021-06-20: 510 mg via INTRAVENOUS
  Filled 2021-06-20: qty 34

## 2021-06-20 NOTE — Telephone Encounter (Signed)
PCCM: ? ?Refill orders sent in.  ?I spoke with patients daughter ?Patient currently under going chemo ? ?Patient hasnt been seen in office recently but I just took him back for a bronch recently.  ? ?BLI  ? ? ?Garner Nash, DO ?[facility] ?06/20/2021 8:59 AM   ? ?

## 2021-06-20 NOTE — Patient Instructions (Signed)
[facility]  Discharge Instructions: ?Thank you for choosing [facility] to provide your oncology and hematology care.  ? ?If you have a lab appointment with the [facility], please go directly to the [facility] and check in at the registration area. ?  ?Wear comfortable clothing and clothing appropriate for easy access to any Portacath or PICC line.  ? ?We strive to give you quality time with your provider. You may need to reschedule your appointment if you arrive late (15 or more minutes).  Arriving late affects you and other patients whose appointments are after yours.  Also, if you miss three or more appointments without notifying the office, you may be dismissed from the clinic at the provider?s discretion.    ?  ?For prescription refill requests, have your pharmacy contact our office and allow 72 hours for refills to be completed.   ? ?Today you received the following chemotherapy and/or immunotherapy agents Etoposide    ?  ?To help prevent nausea and vomiting after your treatment, we encourage you to take your nausea medication as directed. ? ?BELOW ARE SYMPTOMS THAT SHOULD BE REPORTED IMMEDIATELY: ?*FEVER GREATER THAN 100.4 F (38 ?C) OR HIGHER ?*CHILLS OR SWEATING ?*NAUSEA AND VOMITING THAT IS NOT CONTROLLED WITH YOUR NAUSEA MEDICATION ?*UNUSUAL SHORTNESS OF BREATH ?*UNUSUAL BRUISING OR BLEEDING ?*URINARY PROBLEMS (pain or burning when urinating, or frequent urination) ?*BOWEL PROBLEMS (unusual diarrhea, constipation, pain near the anus) ?TENDERNESS IN MOUTH AND THROAT WITH OR WITHOUT PRESENCE OF ULCERS (sore throat, sores in mouth, or a toothache) ?UNUSUAL RASH, SWELLING OR PAIN  ?UNUSUAL VAGINAL DISCHARGE OR ITCHING  ? ?Items with * indicate a potential emergency and should be followed up as soon as possible or go to the Emergency Department if any problems should occur. ? ?Please show the CHEMOTHERAPY ALERT CARD or IMMUNOTHERAPY ALERT CARD at check-in to  the Emergency Department and triage nurse. ? ?Should you have questions after your visit or need to cancel or reschedule your appointment, please contact [facility]  Dept: [phone]  and follow the prompts.  Office hours are 8:00 a.m. to 4:30 p.m. Monday - Friday. Please note that voicemails left after 4:00 p.m. may not be returned until the following business day.  We are closed weekends and major holidays. You have access to a nurse at all times for urgent questions. Please call the main number to the clinic Dept: [phone] and follow the prompts. ? ? ?For any non-urgent questions, you may also contact your provider using MyChart. We now offer e-Visits for anyone 56 and older to request care online for non-urgent symptoms. For details visit [email or web address]. ?  ?Also download the MyChart app! Go to the app store, search "MyChart", open the app, select [facility], and log in with your MyChart username and password. ? ?Due to Covid, a mask is required upon entering the hospital/clinic. If you do not have a mask, one will be given to you upon arrival. For doctor visits, patients may have 1 support person aged 64 or older with them. For treatment visits, patients cannot have anyone with them due to current Covid guidelines and our immunocompromised population.  ? ?

## 2021-06-24 ENCOUNTER — Ambulatory Visit: Payer: Medicare Other | Admitting: Pulmonary Disease

## 2021-06-25 ENCOUNTER — Other Ambulatory Visit: Payer: Self-pay

## 2021-06-25 ENCOUNTER — Inpatient Hospital Stay: Payer: Medicare Other

## 2021-06-25 ENCOUNTER — Other Ambulatory Visit: Payer: Medicare Other

## 2021-06-25 DIAGNOSIS — Z95828 Presence of other vascular implants and grafts: Secondary | ICD-10-CM

## 2021-06-25 DIAGNOSIS — Z5111 Encounter for antineoplastic chemotherapy: Secondary | ICD-10-CM | POA: Diagnosis not present

## 2021-06-25 DIAGNOSIS — C3490 Malignant neoplasm of unspecified part of unspecified bronchus or lung: Secondary | ICD-10-CM

## 2021-06-25 LAB — CBC WITH DIFFERENTIAL (CANCER CENTER ONLY)
Abs Immature Granulocytes: 0.04 10*3/uL (ref 0.00–0.07)
Basophils Absolute: 0.1 10*3/uL (ref 0.0–0.1)
Basophils Relative: 1 %
Eosinophils Absolute: 0.1 10*3/uL (ref 0.0–0.5)
Eosinophils Relative: 2 %
HCT: 31.5 % — ABNORMAL LOW (ref 39.0–52.0)
Hemoglobin: 10.7 g/dL — ABNORMAL LOW (ref 13.0–17.0)
Immature Granulocytes: 1 %
Lymphocytes Relative: 22 %
Lymphs Abs: 1 10*3/uL (ref 0.7–4.0)
MCH: 31.9 pg (ref 26.0–34.0)
MCHC: 34 g/dL (ref 30.0–36.0)
MCV: 94 fL (ref 80.0–100.0)
Monocytes Absolute: 0.2 10*3/uL (ref 0.1–1.0)
Monocytes Relative: 3 %
Neutro Abs: 3.3 10*3/uL (ref 1.7–7.7)
Neutrophils Relative %: 71 %
Platelet Count: 167 10*3/uL (ref 150–400)
RBC: 3.35 MIL/uL — ABNORMAL LOW (ref 4.22–5.81)
RDW: 15.7 % — ABNORMAL HIGH (ref 11.5–15.5)
Smear Review: NORMAL
WBC Count: 4.7 10*3/uL (ref 4.0–10.5)
nRBC: 0 % (ref 0.0–0.2)

## 2021-06-25 LAB — CMP (CANCER CENTER ONLY)
ALT: 10 U/L (ref 0–44)
AST: 13 U/L — ABNORMAL LOW (ref 15–41)
Albumin: 3.9 g/dL (ref 3.5–5.0)
Alkaline Phosphatase: 62 U/L (ref 38–126)
Anion gap: 7 (ref 5–15)
BUN: 28 mg/dL — ABNORMAL HIGH (ref 8–23)
CO2: 29 mmol/L (ref 22–32)
Calcium: 9 mg/dL (ref 8.9–10.3)
Chloride: 102 mmol/L (ref 98–111)
Creatinine: 0.91 mg/dL (ref 0.61–1.24)
GFR, Estimated: >60 mL/min (ref >60)
Glucose, Bld: 118 mg/dL — ABNORMAL HIGH (ref 70–99)
Potassium: 4.1 mmol/L (ref 3.5–5.1)
Sodium: 138 mmol/L (ref 135–145)
Total Bilirubin: 0.5 mg/dL (ref 0.3–1.2)
Total Protein: 6.6 g/dL (ref 6.5–8.1)

## 2021-06-25 MED ORDER — SODIUM CHLORIDE 0.9% FLUSH
10.0000 mL | Freq: Once | INTRAVENOUS | Status: AC
  Administered 2021-06-25: 10 mL

## 2021-06-25 MED ORDER — HEPARIN SOD (PORK) LOCK FLUSH 100 UNIT/ML IV SOLN
500.0000 [IU] | Freq: Once | INTRAVENOUS | Status: AC
  Administered 2021-06-25: 500 [IU]

## 2021-07-02 ENCOUNTER — Other Ambulatory Visit: Payer: Medicare Other

## 2021-07-02 ENCOUNTER — Other Ambulatory Visit: Payer: Self-pay

## 2021-07-02 ENCOUNTER — Inpatient Hospital Stay: Payer: Medicare Other | Attending: Internal Medicine

## 2021-07-02 DIAGNOSIS — Z5111 Encounter for antineoplastic chemotherapy: Secondary | ICD-10-CM | POA: Diagnosis present

## 2021-07-02 DIAGNOSIS — C3411 Malignant neoplasm of upper lobe, right bronchus or lung: Secondary | ICD-10-CM | POA: Insufficient documentation

## 2021-07-02 DIAGNOSIS — M25562 Pain in left knee: Secondary | ICD-10-CM | POA: Diagnosis not present

## 2021-07-02 DIAGNOSIS — C3431 Malignant neoplasm of lower lobe, right bronchus or lung: Secondary | ICD-10-CM | POA: Diagnosis present

## 2021-07-02 DIAGNOSIS — D649 Anemia, unspecified: Secondary | ICD-10-CM | POA: Diagnosis not present

## 2021-07-02 DIAGNOSIS — C3490 Malignant neoplasm of unspecified part of unspecified bronchus or lung: Secondary | ICD-10-CM

## 2021-07-02 DIAGNOSIS — G8929 Other chronic pain: Secondary | ICD-10-CM | POA: Insufficient documentation

## 2021-07-02 DIAGNOSIS — R591 Generalized enlarged lymph nodes: Secondary | ICD-10-CM | POA: Insufficient documentation

## 2021-07-02 DIAGNOSIS — Z79899 Other long term (current) drug therapy: Secondary | ICD-10-CM | POA: Diagnosis not present

## 2021-07-02 DIAGNOSIS — C787 Secondary malignant neoplasm of liver and intrahepatic bile duct: Secondary | ICD-10-CM | POA: Diagnosis not present

## 2021-07-02 DIAGNOSIS — D696 Thrombocytopenia, unspecified: Secondary | ICD-10-CM | POA: Diagnosis not present

## 2021-07-02 DIAGNOSIS — Z95828 Presence of other vascular implants and grafts: Secondary | ICD-10-CM

## 2021-07-02 LAB — CMP (CANCER CENTER ONLY)
ALT: 9 U/L (ref 0–44)
AST: 10 U/L — ABNORMAL LOW (ref 15–41)
Albumin: 3.9 g/dL (ref 3.5–5.0)
Alkaline Phosphatase: 60 U/L (ref 38–126)
Anion gap: 6 (ref 5–15)
BUN: 22 mg/dL (ref 8–23)
CO2: 28 mmol/L (ref 22–32)
Calcium: 9.2 mg/dL (ref 8.9–10.3)
Chloride: 104 mmol/L (ref 98–111)
Creatinine: 0.98 mg/dL (ref 0.61–1.24)
GFR, Estimated: >60 mL/min (ref >60)
Glucose, Bld: 110 mg/dL — ABNORMAL HIGH (ref 70–99)
Potassium: 4.1 mmol/L (ref 3.5–5.1)
Sodium: 138 mmol/L (ref 135–145)
Total Bilirubin: 0.3 mg/dL (ref 0.3–1.2)
Total Protein: 6.7 g/dL (ref 6.5–8.1)

## 2021-07-02 LAB — CBC WITH DIFFERENTIAL (CANCER CENTER ONLY)
Abs Immature Granulocytes: 0.01 10*3/uL (ref 0.00–0.07)
Basophils Absolute: 0 10*3/uL (ref 0.0–0.1)
Basophils Relative: 1 %
Eosinophils Absolute: 0.1 10*3/uL (ref 0.0–0.5)
Eosinophils Relative: 2 %
HCT: 29.5 % — ABNORMAL LOW (ref 39.0–52.0)
Hemoglobin: 9.6 g/dL — ABNORMAL LOW (ref 13.0–17.0)
Immature Granulocytes: 0 %
Lymphocytes Relative: 32 %
Lymphs Abs: 0.8 10*3/uL (ref 0.7–4.0)
MCH: 31.3 pg (ref 26.0–34.0)
MCHC: 32.5 g/dL (ref 30.0–36.0)
MCV: 96.1 fL (ref 80.0–100.0)
Monocytes Absolute: 0.7 10*3/uL (ref 0.1–1.0)
Monocytes Relative: 27 %
Neutro Abs: 1 10*3/uL — ABNORMAL LOW (ref 1.7–7.7)
Neutrophils Relative %: 38 %
Platelet Count: 67 10*3/uL — ABNORMAL LOW (ref 150–400)
RBC: 3.07 MIL/uL — ABNORMAL LOW (ref 4.22–5.81)
RDW: 16.8 % — ABNORMAL HIGH (ref 11.5–15.5)
WBC Count: 2.6 10*3/uL — ABNORMAL LOW (ref 4.0–10.5)
nRBC: 0 % (ref 0.0–0.2)

## 2021-07-02 MED ORDER — SODIUM CHLORIDE 0.9% FLUSH
10.0000 mL | Freq: Once | INTRAVENOUS | Status: AC
  Administered 2021-07-02: 10 mL

## 2021-07-02 MED ORDER — HEPARIN SOD (PORK) LOCK FLUSH 100 UNIT/ML IV SOLN
500.0000 [IU] | Freq: Once | INTRAVENOUS | Status: AC
  Administered 2021-07-02: 500 [IU]

## 2021-07-04 ENCOUNTER — Ambulatory Visit (HOSPITAL_COMMUNITY)
Admission: RE | Admit: 2021-07-04 | Discharge: 2021-07-04 | Disposition: A | Discharge status: Home / Self Care | Payer: Medicare Other | Source: Ambulatory Visit | Attending: Physician Assistant | Admitting: Physician Assistant

## 2021-07-04 DIAGNOSIS — C3490 Malignant neoplasm of unspecified part of unspecified bronchus or lung: Secondary | ICD-10-CM | POA: Diagnosis present

## 2021-07-04 MED ORDER — HEPARIN SOD (PORK) LOCK FLUSH 100 UNIT/ML IV SOLN
500.0000 [IU] | Freq: Once | INTRAVENOUS | Status: AC
  Administered 2021-07-04: 500 [IU] via INTRAVENOUS

## 2021-07-04 MED ORDER — HEPARIN SOD (PORK) LOCK FLUSH 100 UNIT/ML IV SOLN
INTRAVENOUS | Status: DC
Start: 2021-07-04 — End: 2021-07-05
  Filled 2021-07-04: qty 5

## 2021-07-04 MED ORDER — IOHEXOL 300 MG/ML  SOLN
100.0000 mL | Freq: Once | INTRAMUSCULAR | Status: AC | PRN
  Administered 2021-07-04: 100 mL via INTRAVENOUS

## 2021-07-04 MED ORDER — SODIUM CHLORIDE (PF) 0.9 % IJ SOLN
INTRAMUSCULAR | Status: DC
Start: 2021-07-04 — End: 2021-07-05
  Filled 2021-07-04: qty 50

## 2021-07-06 ENCOUNTER — Ambulatory Visit (HOSPITAL_COMMUNITY): Payer: Medicare Other

## 2021-07-08 ENCOUNTER — Encounter: Payer: Self-pay | Admitting: Medical-Surgical

## 2021-07-08 ENCOUNTER — Ambulatory Visit (INDEPENDENT_AMBULATORY_CARE_PROVIDER_SITE_OTHER): Payer: Medicare Other

## 2021-07-08 ENCOUNTER — Ambulatory Visit (INDEPENDENT_AMBULATORY_CARE_PROVIDER_SITE_OTHER): Payer: Medicare Other | Admitting: Medical-Surgical

## 2021-07-08 ENCOUNTER — Telehealth: Payer: Self-pay

## 2021-07-08 VITALS — BP 113/75 | HR 93 | Resp 20 | Ht 72.0 in | Wt 200.1 lb

## 2021-07-08 DIAGNOSIS — G8929 Other chronic pain: Secondary | ICD-10-CM

## 2021-07-08 DIAGNOSIS — M25562 Pain in left knee: Secondary | ICD-10-CM | POA: Diagnosis not present

## 2021-07-08 MED ORDER — PREDNISONE 50 MG PO TABS: 50.0000 mg | ORAL_TABLET | Freq: Every day | ORAL | 0 refills | Status: DC

## 2021-07-08 NOTE — Progress Notes (Signed)
?  HPI with pertinent ROS:  ? ?CC: left knee pain ? ?HPI: ?Pleasant 81 year old male accompanied by his wife presenting today for acute onset severe left knee pain along the top medial aspect of the knee. Pain started Wednesday of last week (~ 6 days ago) and has continued without improvement despite interventions. They have tried Tylenol, Colchicine, Ice, Heat, Elevation, and Rest but nothing has been helpful. Pain worse when attempting to rise from a seated position, rated 10/10. When up and walking pain is only about 2/10.  ? ?Of note, they received a phone call on arrival to our office that his urine was positive for a UTI and they are starting him on Keflex.  ? ?I reviewed the past medical history, family history, social history, surgical history, and allergies today and no changes were needed.  Please see the problem list section below in epic for further details. ? ?Physical exam:  ? ?General: Well Developed, well nourished, and in no acute distress.  ?Neuro: Alert and oriented x3.  ?HEENT: Normocephalic, atraumatic.  ?Skin: Warm and dry. ?Cardiac: Regular rate and rhythm, no murmurs rubs or gallops, no lower extremity edema.  ?Respiratory: Clear to auscultation bilaterally. Not using accessory muscles, speaking in full sentences. ? ?Impression and Recommendations:   ? ?1. Acute pain of left knee ?Left knee x-rays today. Discussed RICE and continued conservative treatment. Adding Prednisone 50mg  daily x 5 days but recommend they contact Oncology to verify that this is ok since he starts Immunotherapy tomorrow. If not ok, can add a low dose Tramadol for pain management for short term.  ?- DG Knee Complete 4 Views Left; Future ? ?Return if symptoms worsen or fail to improve. ?___________________________________________ ?Clearnce Sorrel, DNP, APRN, FNP-BC ?Primary Care and Sports Medicine ?[facility] ?

## 2021-07-08 NOTE — Telephone Encounter (Signed)
Pt's wife called to say that patient has been prescribed Cephalexin for UTI and Prednisone for knee pain. Medication list updated. ?

## 2021-07-09 ENCOUNTER — Other Ambulatory Visit: Payer: Self-pay

## 2021-07-09 ENCOUNTER — Inpatient Hospital Stay: Payer: Medicare Other

## 2021-07-09 ENCOUNTER — Inpatient Hospital Stay (HOSPITAL_BASED_OUTPATIENT_CLINIC_OR_DEPARTMENT_OTHER): Payer: Medicare Other | Admitting: Internal Medicine

## 2021-07-09 VITALS — BP 140/70 | HR 79 | Temp 97.4°F | Resp 20 | Ht 72.0 in | Wt 200.5 lb

## 2021-07-09 DIAGNOSIS — Z95828 Presence of other vascular implants and grafts: Secondary | ICD-10-CM

## 2021-07-09 DIAGNOSIS — C3490 Malignant neoplasm of unspecified part of unspecified bronchus or lung: Secondary | ICD-10-CM

## 2021-07-09 DIAGNOSIS — C3411 Malignant neoplasm of upper lobe, right bronchus or lung: Secondary | ICD-10-CM

## 2021-07-09 DIAGNOSIS — Z5111 Encounter for antineoplastic chemotherapy: Secondary | ICD-10-CM | POA: Diagnosis not present

## 2021-07-09 LAB — CMP (CANCER CENTER ONLY)
ALT: 11 U/L (ref 0–44)
AST: 14 U/L — ABNORMAL LOW (ref 15–41)
Albumin: 4 g/dL (ref 3.5–5.0)
Alkaline Phosphatase: 63 U/L (ref 38–126)
Anion gap: 8 (ref 5–15)
BUN: 22 mg/dL (ref 8–23)
CO2: 26 mmol/L (ref 22–32)
Calcium: 9.4 mg/dL (ref 8.9–10.3)
Chloride: 105 mmol/L (ref 98–111)
Creatinine: 1.02 mg/dL (ref 0.61–1.24)
GFR, Estimated: >60 mL/min (ref >60)
Glucose, Bld: 107 mg/dL — ABNORMAL HIGH (ref 70–99)
Potassium: 4.2 mmol/L (ref 3.5–5.1)
Sodium: 139 mmol/L (ref 135–145)
Total Bilirubin: 0.3 mg/dL (ref 0.3–1.2)
Total Protein: 6.9 g/dL (ref 6.5–8.1)

## 2021-07-09 LAB — CBC WITH DIFFERENTIAL (CANCER CENTER ONLY)
Abs Immature Granulocytes: 0.31 10*3/uL — ABNORMAL HIGH (ref 0.00–0.07)
Basophils Absolute: 0 10*3/uL (ref 0.0–0.1)
Basophils Relative: 1 %
Eosinophils Absolute: 0 10*3/uL (ref 0.0–0.5)
Eosinophils Relative: 1 %
HCT: 29.9 % — ABNORMAL LOW (ref 39.0–52.0)
Hemoglobin: 9.7 g/dL — ABNORMAL LOW (ref 13.0–17.0)
Immature Granulocytes: 5 %
Lymphocytes Relative: 12 %
Lymphs Abs: 0.8 10*3/uL (ref 0.7–4.0)
MCH: 31.5 pg (ref 26.0–34.0)
MCHC: 32.4 g/dL (ref 30.0–36.0)
MCV: 97.1 fL (ref 80.0–100.0)
Monocytes Absolute: 0.5 10*3/uL (ref 0.1–1.0)
Monocytes Relative: 7 %
Neutro Abs: 5 10*3/uL (ref 1.7–7.7)
Neutrophils Relative %: 74 %
Platelet Count: 207 10*3/uL (ref 150–400)
RBC: 3.08 MIL/uL — ABNORMAL LOW (ref 4.22–5.81)
RDW: 17.8 % — ABNORMAL HIGH (ref 11.5–15.5)
WBC Count: 6.6 10*3/uL (ref 4.0–10.5)
nRBC: 1.4 % — ABNORMAL HIGH (ref 0.0–0.2)

## 2021-07-09 MED ORDER — SODIUM CHLORIDE 0.9% FLUSH
10.0000 mL | Freq: Once | INTRAVENOUS | Status: AC
  Administered 2021-07-09: 10 mL via INTRAVENOUS

## 2021-07-09 MED ORDER — SODIUM CHLORIDE 0.9% FLUSH
10.0000 mL | Freq: Once | INTRAVENOUS | Status: AC
  Administered 2021-07-09: 10 mL

## 2021-07-09 MED ORDER — HEPARIN SOD (PORK) LOCK FLUSH 100 UNIT/ML IV SOLN
500.0000 [IU] | Freq: Once | INTRAVENOUS | Status: AC
  Administered 2021-07-09: 500 [IU] via INTRAVENOUS

## 2021-07-09 NOTE — Addendum Note (Signed)
Addended by: Georgette Shell T on: 07/09/2021 02:27 PM ? ? Modules accepted: Orders ? ?

## 2021-07-09 NOTE — Progress Notes (Signed)
?    [facility] ?[phone]   [phone] ? ?OFFICE PROGRESS NOTE ? ?[name], DO ?[address] ?[place] [address] ? ?DIAGNOSIS: Extensive stage (T2 a, N2, M1b) small cell lung cancer that was initially diagnosed as synchronous stage Ia non-small cell lung cancer, squamous cell carcinoma involving right upper lobe pulmonary nodule in addition to right lower lobe endobronchial lesion diagnosed in June 2021.  The patient has evidence for disease recurrence and metastasis in December 2022. ? ?PRIOR THERAPY:  ?1) SBRT to the right upper lobe pulmonary nodule as well as the right lower lobe pulmonary nodule under the care of Dr. Sondra Come completed on 11/14/2019. ?2) Palliative systemic chemotherapy with carboplatin for an AUC of 5, etoposide 100 mg/m2, and Imfinzi 1500 mg IV every 3 weeks with Cosela.  First dose expected on 04/16/21.  Status post 4 cycles. ?  ?CURRENT THERAPY: Zepzelca (lurbinectedin) 3.2 Mg/M2 every 3 weeks.  First dose July 16, 2021 ? ?INTERVAL HISTORY: ?Hilman Kissling 81 y.o. male returns to the clinic today for follow-up visit accompanied by his wife.  The patient is feeling fine today with no concerning complaints except for arthralgia in the knees.  He was started on prednisone tablets the last few days.  He denied having any current chest pain, shortness of breath except with exertion with no cough or hemoptysis.  He denied having any current fever or chills.  He has no nausea, vomiting, diarrhea or constipation.  He continues to complain of fatigue and feeling cold most of the time.  He has been tolerating his systemic chemotherapy with carboplatin, etoposide and Imfinzi fairly well.  The patient had repeat CT scan of the chest, abdomen pelvis performed recently and he is here for evaluation and discussion of his scan results and treatment options. ? ? ?MEDICAL HISTORY: ?Past Medical History:  ?Diagnosis Date  ? Bradycardia   ?  Bursitis   ? Carotid artery disease ([facility])   ? COPD (chronic obstructive pulmonary disease) ([facility])   ? Coronary artery disease   ? CTO mid-dist CX (fills from left-to-left collaterals), 99% OM2, 30% ost-dist LM, 30% prox-mid LAD, 30% ost-mid CX, 99% prox RCA (too small for PCI), medical management 02/09/20  ? Hypercholesterolemia   ? Ischemic cardiomyopathy   ? Lung cancer ([facility])   ? Lung nodule   ? nscl ca dx'd 08/2019  ? Psoriasis   ? Tobacco abuse   ? Wears glasses   ? reading  ? ? ?ALLERGIES:  is allergic to cosela [trilaciclib]. ? ?MEDICATIONS:  ?Current Outpatient Medications  ?Medication Sig Dispense Refill  ? albuterol (VENTOLIN HFA) 108 (90 Base) MCG/ACT inhaler Inhale 2 puffs into the lungs every 6 (six) hours as needed for wheezing or shortness of breath. 8 g 2  ? AMBULATORY NON FORMULARY MEDICATION Please provide 4 wheel rolling walker with seat for [employer].  Diagnosis: R26.81 Gait instability 1 Device 0  ? aspirin EC 81 MG tablet Take 1 tablet (81 mg total) by mouth daily. Swallow whole. 90 tablet 3  ? cephALEXin (KEFLEX) 500 MG capsule Take 500 mg by mouth 2 (two) times daily. Take 500 mg BID for 7 days    ? clobetasol cream (TEMOVATE) 3.41 % Apply 1 application topically daily as needed (Psoriasis).    ? Fluticasone-Umeclidin-Vilant (TRELEGY ELLIPTA) 100-62.5-25 MCG/ACT AEPB Inhale 1 puff into the lungs daily. 60 each 11  ? lidocaine-prilocaine (EMLA) cream Apply to the Port-A-Cath site 30-60-minute  before chemotherapy. 30 g 0  ? Multiple Vitamins-Minerals (PRESERVISION AREDS 2 PO) Take 1 tablet by mouth in the morning and at bedtime.    ? niacinamide 500 MG tablet Take 500 mg by mouth daily with breakfast.    ? nicotine (NICODERM CQ - DOSED IN MG/24 HOURS) 21 mg/24hr patch Place 1 patch (21 mg total) onto the skin daily. 28 patch 0  ? predniSONE (DELTASONE) 50 MG tablet Take 1 tablet (50 mg total) by mouth daily. 5 tablet 0  ? prochlorperazine (COMPAZINE) 10 MG tablet Take 1 tablet (10 mg total) by  mouth every 6 (six) hours as needed. 30 tablet 2  ? rosuvastatin (CRESTOR) 20 MG tablet Take 1 tablet (20 mg total) by mouth daily. 90 tablet 3  ? tamsulosin (FLOMAX) 0.4 MG CAPS capsule Take 0.4 mg by mouth.    ? triamcinolone cream (KENALOG) 0.1 % Apply 1 application topically daily as needed (skin irritation.).    ? vitamin B-12 (CYANOCOBALAMIN) 500 MCG tablet Take 500 mcg by mouth every evening.    ? ?No current facility-administered medications for this visit.  ? ? ?SURGICAL HISTORY:  ?Past Surgical History:  ?Procedure Laterality Date  ? BRONCHIAL BIOPSY  09/27/2019  ? Procedure: BRONCHIAL BIOPSIES;  Surgeon: Garner Nash, DO;  Location: [facility];  Service: Pulmonary;;  ? BRONCHIAL BRUSHINGS  09/27/2019  ? Procedure: BRONCHIAL BRUSHINGS;  Surgeon: Garner Nash, DO;  Location: [facility];  Service: Pulmonary;;  ? BRONCHIAL NEEDLE ASPIRATION BIOPSY  09/27/2019  ? Procedure: BRONCHIAL NEEDLE ASPIRATION BIOPSIES;  Surgeon: Garner Nash, DO;  Location: [facility];  Service: Pulmonary;;  ? BRONCHIAL NEEDLE ASPIRATION BIOPSY  03/29/2021  ? Procedure: BRONCHIAL NEEDLE ASPIRATION BIOPSIES;  Surgeon: Garner Nash, DO;  Location: [facility];  Service: Pulmonary;;  ? BRONCHIAL WASHINGS  09/27/2019  ? Procedure: BRONCHIAL WASHINGS;  Surgeon: Garner Nash, DO;  Location: [facility];  Service: Pulmonary;;  ? BRONCHIAL WASHINGS  03/29/2021  ? Procedure: BRONCHIAL WASHINGS;  Surgeon: Garner Nash, DO;  Location: [facility];  Service: Pulmonary;;  ? COLONOSCOPY    ? CRYOTHERAPY  09/27/2019  ? Procedure: CRYOTHERAPY;  Surgeon: Garner Nash, DO;  Location: [facility];  Service: Pulmonary;;  ? CRYOTHERAPY  03/29/2021  ? Procedure: CRYOTHERAPY;  Surgeon: Garner Nash, DO;  Location: [facility];  Service: Pulmonary;;  ? FIDUCIAL MARKER PLACEMENT  09/27/2019  ? Procedure: FIDUCIAL MARKER PLACEMENT;  Surgeon: Garner Nash, DO;  Location: [facility];  Service: Pulmonary;;  ?  HEMOSTASIS CONTROL  09/27/2019  ? Procedure: HEMOSTASIS CONTROL;  Surgeon: Garner Nash, DO;  Location: [facility];  Service: Pulmonary;;  ? HERNIA REPAIR    ? umbicial- no longer as a "belly button"  ? IR IMAGING GUIDED PORT INSERTION  05/21/2021  ? IR US GUIDE VASC ACCESS LEFT  05/21/2021  ? RIGHT/LEFT HEART CATH AND CORONARY ANGIOGRAPHY N/A 02/09/2020  ? Procedure: RIGHT/LEFT HEART CATH AND CORONARY ANGIOGRAPHY;  Surgeon: Burnell Blanks, MD;  Location: [facility];  Service: Cardiovascular;  Laterality: N/A;  ? VIDEO BRONCHOSCOPY WITH ENDOBRONCHIAL NAVIGATION N/A 09/27/2019  ? Procedure: VIDEO BRONCHOSCOPY WITH ENDOBRONCHIAL NAVIGATION;  Surgeon: Garner Nash, DO;  Location: [facility];  Service: Pulmonary;  Laterality: N/A;  ? VIDEO BRONCHOSCOPY WITH ENDOBRONCHIAL ULTRASOUND N/A 03/29/2021  ? Procedure: VIDEO BRONCHOSCOPY WITH ENDOBRONCHIAL ULTRASOUND;  Surgeon: Garner Nash, DO;  Location: [facility];  Service: Pulmonary;  Laterality: N/A;  ? ? ?REVIEW OF SYSTEMS:  Constitutional:  positive for fatigue ?Eyes: negative ?Ears, nose, mouth, throat, and face: negative ?Respiratory: positive for dyspnea on exertion ?Cardiovascular: negative ?Gastrointestinal: negative ?Genitourinary:negative ?Integument/breast: negative ?Hematologic/lymphatic: negative ?Musculoskeletal:negative ?Neurological: negative ?Behavioral/Psych: negative ?Endocrine: negative ?Allergic/Immunologic: negative  ? ?PHYSICAL EXAMINATION: General appearance: alert, cooperative, fatigued, and no distress ?Head: Normocephalic, without obvious abnormality, atraumatic ?Neck: no adenopathy, no JVD, supple, symmetrical, trachea midline, and thyroid not enlarged, symmetric, no tenderness/mass/nodules ?Lymph nodes: Cervical, supraclavicular, and axillary nodes normal. ?Resp: clear to auscultation bilaterally ?Back: symmetric, no curvature. ROM normal. No CVA tenderness. ?Cardio: regular rate and rhythm, S1, S2 normal, no  murmur, click, rub or gallop ?GI: soft, non-tender; bowel sounds normal; no masses,  no organomegaly ?Extremities: extremities normal, atraumatic, no cyanosis or edema ?Neurologic: Alert and oriented X 3, normal

## 2021-07-09 NOTE — Progress Notes (Signed)
DISCONTINUE ON PATHWAY REGIMEN - Small Cell Lung ? ? ?  Cycles 1 through 4: A cycle is every 21 days: ?    Durvalumab  ?    Carboplatin  ?    Etoposide  ?  Cycles 5 and beyond: A cycle is every 28 days: ?    Durvalumab  ? ?**Always confirm dose/schedule in your pharmacy ordering system** ? ?REASON: Disease Progression ?PRIOR TREATMENT: [id number]: Durvalumab 1,500 mg D1 + Carboplatin AUC=5 D1 + Etoposide 100 mg/m2 D1-3 q21 Days x 4 Cycles, Followed by Durvalumab 1,500 mg q28 Days Until Progression or Unacceptable Toxicity ?TREATMENT RESPONSE: Progressive Disease (PD) ? ?START ON PATHWAY REGIMEN - Small Cell Lung ? ? ?  A cycle is every 21 days: ?    Lurbinectedin  ? ?**Always confirm dose/schedule in your pharmacy ordering system** ? ?Patient Characteristics: ?Relapsed or Progressive Disease, Second Line, Relapse ? 6 Months ?Therapeutic Status: Relapsed or Progressive Disease ?Line of Therapy: Second Line ?Time to Relapse: Relapse ? 6 Months ?Intent of Therapy: ?Non-Curative / Palliative Intent, Discussed with Patient ?

## 2021-07-12 ENCOUNTER — Telehealth: Payer: Self-pay | Admitting: Internal Medicine

## 2021-07-12 NOTE — Telephone Encounter (Signed)
Scheduled per 04/11 los, patient has been called and notified of upcoming appointments.  ?

## 2021-07-16 ENCOUNTER — Inpatient Hospital Stay: Payer: Medicare Other

## 2021-07-16 ENCOUNTER — Other Ambulatory Visit: Payer: Self-pay | Admitting: Medical-Surgical

## 2021-07-16 ENCOUNTER — Other Ambulatory Visit: Payer: Self-pay

## 2021-07-16 ENCOUNTER — Other Ambulatory Visit: Payer: Medicare Other

## 2021-07-16 VITALS — BP 117/62 | HR 92 | Resp 18 | Wt 198.0 lb

## 2021-07-16 DIAGNOSIS — Z95828 Presence of other vascular implants and grafts: Secondary | ICD-10-CM

## 2021-07-16 DIAGNOSIS — C3411 Malignant neoplasm of upper lobe, right bronchus or lung: Secondary | ICD-10-CM

## 2021-07-16 DIAGNOSIS — Z5111 Encounter for antineoplastic chemotherapy: Secondary | ICD-10-CM | POA: Diagnosis not present

## 2021-07-16 LAB — CBC WITH DIFFERENTIAL (CANCER CENTER ONLY)
Abs Immature Granulocytes: 0.11 10*3/uL — ABNORMAL HIGH (ref 0.00–0.07)
Basophils Absolute: 0 10*3/uL (ref 0.0–0.1)
Basophils Relative: 0 %
Eosinophils Absolute: 0.1 10*3/uL (ref 0.0–0.5)
Eosinophils Relative: 0 %
HCT: 31.6 % — ABNORMAL LOW (ref 39.0–52.0)
Hemoglobin: 10.3 g/dL — ABNORMAL LOW (ref 13.0–17.0)
Immature Granulocytes: 1 %
Lymphocytes Relative: 7 %
Lymphs Abs: 1 10*3/uL (ref 0.7–4.0)
MCH: 31.8 pg (ref 26.0–34.0)
MCHC: 32.6 g/dL (ref 30.0–36.0)
MCV: 97.5 fL (ref 80.0–100.0)
Monocytes Absolute: 2.1 10*3/uL — ABNORMAL HIGH (ref 0.1–1.0)
Monocytes Relative: 15 %
Neutro Abs: 11 10*3/uL — ABNORMAL HIGH (ref 1.7–7.7)
Neutrophils Relative %: 77 %
Platelet Count: 175 10*3/uL (ref 150–400)
RBC: 3.24 MIL/uL — ABNORMAL LOW (ref 4.22–5.81)
RDW: 17.2 % — ABNORMAL HIGH (ref 11.5–15.5)
WBC Count: 14.2 10*3/uL — ABNORMAL HIGH (ref 4.0–10.5)
nRBC: 0 % (ref 0.0–0.2)

## 2021-07-16 LAB — CMP (CANCER CENTER ONLY)
ALT: 15 U/L (ref 0–44)
AST: 15 U/L (ref 15–41)
Albumin: 3.4 g/dL — ABNORMAL LOW (ref 3.5–5.0)
Alkaline Phosphatase: 61 U/L (ref 38–126)
Anion gap: 7 (ref 5–15)
BUN: 19 mg/dL (ref 8–23)
CO2: 27 mmol/L (ref 22–32)
Calcium: 8.7 mg/dL — ABNORMAL LOW (ref 8.9–10.3)
Chloride: 99 mmol/L (ref 98–111)
Creatinine: 1.02 mg/dL (ref 0.61–1.24)
GFR, Estimated: >60 mL/min (ref >60)
Glucose, Bld: 146 mg/dL — ABNORMAL HIGH (ref 70–99)
Potassium: 3.9 mmol/L (ref 3.5–5.1)
Sodium: 133 mmol/L — ABNORMAL LOW (ref 135–145)
Total Bilirubin: 1 mg/dL (ref 0.3–1.2)
Total Protein: 6.4 g/dL — ABNORMAL LOW (ref 6.5–8.1)

## 2021-07-16 MED ORDER — HEPARIN SOD (PORK) LOCK FLUSH 100 UNIT/ML IV SOLN
500.0000 [IU] | Freq: Once | INTRAVENOUS | Status: AC | PRN
  Administered 2021-07-16: 500 [IU]

## 2021-07-16 MED ORDER — SODIUM CHLORIDE 0.9 % IV SOLN
10.0000 mg | Freq: Once | INTRAVENOUS | Status: AC
  Administered 2021-07-16: 10 mg via INTRAVENOUS
  Filled 2021-07-16: qty 10

## 2021-07-16 MED ORDER — SODIUM CHLORIDE 0.9 % IV SOLN
3.2000 mg/m2 | Freq: Once | INTRAVENOUS | Status: AC
  Administered 2021-07-16: 6.9 mg via INTRAVENOUS
  Filled 2021-07-16: qty 13.8

## 2021-07-16 MED ORDER — PALONOSETRON HCL INJECTION 0.25 MG/5ML
0.2500 mg | Freq: Once | INTRAVENOUS | Status: AC
  Administered 2021-07-16: 0.25 mg via INTRAVENOUS
  Filled 2021-07-16: qty 5

## 2021-07-16 MED ORDER — SODIUM CHLORIDE 0.9% FLUSH
10.0000 mL | INTRAVENOUS | Status: DC | PRN
  Administered 2021-07-16: 10 mL

## 2021-07-16 MED ORDER — SODIUM CHLORIDE 0.9 % IV SOLN: Freq: Once | INTRAVENOUS | Status: AC

## 2021-07-16 MED ORDER — SODIUM CHLORIDE 0.9% FLUSH
10.0000 mL | Freq: Once | INTRAVENOUS | Status: AC
  Administered 2021-07-16: 10 mL

## 2021-07-16 NOTE — Patient Instructions (Signed)
[facility]  Discharge Instructions: ?Thank you for choosing [facility] to provide your oncology and hematology care.  ? ?If you have a lab appointment with the [facility], please go directly to the [facility] and check in at the registration area. ?  ?Wear comfortable clothing and clothing appropriate for easy access to any Portacath or PICC line.  ? ?We strive to give you quality time with your provider. You may need to reschedule your appointment if you arrive late (15 or more minutes).  Arriving late affects you and other patients whose appointments are after yours.  Also, if you miss three or more appointments without notifying the office, you may be dismissed from the clinic at the provider?s discretion.    ?  ?For prescription refill requests, have your pharmacy contact our office and allow 72 hours for refills to be completed.   ? ?Today you received the following chemotherapy and/or immunotherapy agents: Zepzelca    ?  ?To help prevent nausea and vomiting after your treatment, we encourage you to take your nausea medication as directed. ? ?BELOW ARE SYMPTOMS THAT SHOULD BE REPORTED IMMEDIATELY: ?*FEVER GREATER THAN 100.4 F (38 ?C) OR HIGHER ?*CHILLS OR SWEATING ?*NAUSEA AND VOMITING THAT IS NOT CONTROLLED WITH YOUR NAUSEA MEDICATION ?*UNUSUAL SHORTNESS OF BREATH ?*UNUSUAL BRUISING OR BLEEDING ?*URINARY PROBLEMS (pain or burning when urinating, or frequent urination) ?*BOWEL PROBLEMS (unusual diarrhea, constipation, pain near the anus) ?TENDERNESS IN MOUTH AND THROAT WITH OR WITHOUT PRESENCE OF ULCERS (sore throat, sores in mouth, or a toothache) ?UNUSUAL RASH, SWELLING OR PAIN  ?UNUSUAL VAGINAL DISCHARGE OR ITCHING  ? ?Items with * indicate a potential emergency and should be followed up as soon as possible or go to the Emergency Department if any problems should occur. ? ?Please show the CHEMOTHERAPY ALERT CARD or IMMUNOTHERAPY ALERT CARD at check-in to  the Emergency Department and triage nurse. ? ?Should you have questions after your visit or need to cancel or reschedule your appointment, please contact [facility]  Dept: [phone]  and follow the prompts.  Office hours are 8:00 a.m. to 4:30 p.m. Monday - Friday. Please note that voicemails left after 4:00 p.m. may not be returned until the following business day.  We are closed weekends and major holidays. You have access to a nurse at all times for urgent questions. Please call the main number to the clinic Dept: [phone] and follow the prompts. ? ? ?For any non-urgent questions, you may also contact your provider using MyChart. We now offer e-Visits for anyone 102 and older to request care online for non-urgent symptoms. For details visit [email or web address]. ?  ?Also download the MyChart app! Go to the app store, search "MyChart", open the app, select [facility], and log in with your MyChart username and password. ? ?Due to Covid, a mask is required upon entering the hospital/clinic. If you do not have a mask, one will be given to you upon arrival. For doctor visits, patients may have 1 support person aged 31 or older with them. For treatment visits, patients cannot have anyone with them due to current Covid guidelines and our immunocompromised population.  ? ?Lurbinectedin Injection ?What is this medication? ?LURBINECTEDIN (LOOR bin EK te din) treats lung cancer. It works by slowing down the growth of cancer cells. ?This medicine may be used for other purposes; ask your health care provider or pharmacist if you have questions. ?COMMON BRAND NAME(S): ZEPZELCA ?What  should I tell my care team before I take this medication? ?They need to know if you have any of these conditions: ?Liver disease ?Low blood counts--white cells, platelets, red blood cells ?An unusual or allergic reaction to lurbinectedin, other medicines, foods, dyes or preservatives ?Pregnant or trying to  get pregnant ?Beast-feeding ?How should I use this medication? ?This medication is injected into a vein. It is given in a hospital or clinic setting. ?Talk to your care team about the use of this medicine in children. Special care may be needed. ?Overdosage: If you think you have taken too much of this medicine contact a poison control center or emergency room at once. ?NOTE: This medicine is only for you. Do not share this medicine with others. ?What if I miss a dose? ?Keep appointments for follow-up doses. It is important not to miss your dose. Call your care team if you are unable to keep an appointment. ?What may interact with this medication? ?Certain antibiotics like erythromycin or clarithromycin ?Certain antivirals for HIV or hepatitis ?Certain medicines for fungal infections like ketoconazole, itraconazole, posaconazole ?Certain medicines for seizures like carbamazepine, phenobarbital, phenytoin ?Grapefruit or grapefruit juice ?[facility] ?This list may not describe all possible interactions. Give your health care provider a list of all the medicines, herbs, non-prescription drugs, or dietary supplements you use. Also tell them if you smoke, drink alcohol, or use illegal drugs. Some items may interact with your medicine. ?What should I watch for while using this medication? ?This medication may make you feel generally unwell. This is not uncommon as chemotherapy can affect healthy cells as well as cancer cells. Report any side effects. Continue your course of treatment even though you feel ill unless your care team tells you to stop. ?This medication may increase your risk of getting an infection. Call your care team for advice if you get a fever, chills, sore throat, or other symptoms of a cold or flu. Do not treat yourself. Try to avoid being around people who are sick. ?Avoid taking medications that contain aspirin, acetaminophen, ibuprofen, naproxen, or ketoprofen unless instructed by your care  team. These medications may hide a fever. ?Be careful brushing or flossing your teeth or using a toothpick because you may get an infection or bleed more easily. If you have any dental work done, tell your dentist you are receiving this medication. ?Do not become pregnant while taking this medication or for 6 months after stopping it. Women should inform their care team if they wish to become pregnant or think they might be pregnant. Men should not father a child while taking this medication and for 4 months after stopping it. There is potential for serious side effects to an unborn child. Talk to your care team for more information. Do not breast-feed a child while taking this medication or for 2 weeks after stopping it. ?What side effects may I notice from receiving this medication? ?Side effects that you should report to your care team as soon as possible: ?Allergic reactions--skin rash, itching, hives, swelling of the face, lips, tongue, or throat ?Infection--fever, chills, cough, or sore throat ?Kidney injury--decrease in the amount of urine, swelling of the ankles, hands, or feet ?Leakage of medicine out of your vein during the infusion ?Liver injury--right upper belly pain, loss of appetite, nausea, light-colored stool, dark yellow or brown urine, yellowing skin or eyes, unusual weakness or fatigue ?Low red blood cell count--unusual weakness or fatigue, dizziness, headache, trouble breathing ?Muscle injury--unusual weakness or fatigue,  muscle pain, dark yellow or brown urine, decrease in amount of urine ?Painful swelling, warmth, or redness of the skin, blisters, or sores ?Unusual bruising or bleeding ?Vomiting ?Side effects that usually do not require medical attention (report these to your care team if they continue or are bothersome): ?Constipation ?Cough ?Diarrhea ?High blood sugar (hyperglycemia)--increased thirst or amount of urine, unusual weakness or fatigue, blurry vision ?Loss of appetite ?Low  magnesium level--muscle pain or cramps, unusual weakness or fatigue, fast or irregular heartbeat, tremors ?Low sodium level--muscle weakness, fatigue, dizziness, headache, confusion ?Nausea ?Trouble breathing ?T

## 2021-07-17 ENCOUNTER — Telehealth: Payer: Self-pay

## 2021-07-17 NOTE — Telephone Encounter (Signed)
Spoke with the patient's wife Gerald [name]. She states that her husband is doing fine today. He is eating and drinking fluids.  He is not getting in 64 oz. Encourage her to push the fluids.  Told her that 6 oz of Jello is 6 oz of fluid. ?She called after hours lat evening around 6:30 pm as her husband was very difficult to arouse. She was told to bring him to the ED. She called 911 and then cancelled it as her husband said he was very tired. ?No N/V.noted. ?[name] knows to call the office at [phone] if she has any questions or concerns. ?

## 2021-07-22 ENCOUNTER — Ambulatory Visit: Payer: Medicare Other | Admitting: Family Medicine

## 2021-07-23 ENCOUNTER — Inpatient Hospital Stay: Payer: Medicare Other

## 2021-07-23 ENCOUNTER — Other Ambulatory Visit: Payer: Medicare Other

## 2021-07-23 ENCOUNTER — Other Ambulatory Visit: Payer: Self-pay

## 2021-07-23 ENCOUNTER — Inpatient Hospital Stay (HOSPITAL_BASED_OUTPATIENT_CLINIC_OR_DEPARTMENT_OTHER): Payer: Medicare Other | Admitting: Internal Medicine

## 2021-07-23 VITALS — BP 102/58 | HR 93 | Temp 97.6°F | Resp 18 | Wt 202.4 lb

## 2021-07-23 DIAGNOSIS — C3411 Malignant neoplasm of upper lobe, right bronchus or lung: Secondary | ICD-10-CM

## 2021-07-23 DIAGNOSIS — Z95828 Presence of other vascular implants and grafts: Secondary | ICD-10-CM

## 2021-07-23 DIAGNOSIS — Z5111 Encounter for antineoplastic chemotherapy: Secondary | ICD-10-CM | POA: Diagnosis not present

## 2021-07-23 LAB — CMP (CANCER CENTER ONLY)
ALT: 114 U/L — ABNORMAL HIGH (ref 0–44)
AST: 30 U/L (ref 15–41)
Albumin: 3.2 g/dL — ABNORMAL LOW (ref 3.5–5.0)
Alkaline Phosphatase: 86 U/L (ref 38–126)
Anion gap: 5 (ref 5–15)
BUN: 21 mg/dL (ref 8–23)
CO2: 28 mmol/L (ref 22–32)
Calcium: 8.5 mg/dL — ABNORMAL LOW (ref 8.9–10.3)
Chloride: 104 mmol/L (ref 98–111)
Creatinine: 0.95 mg/dL (ref 0.61–1.24)
GFR, Estimated: >60 mL/min (ref >60)
Glucose, Bld: 144 mg/dL — ABNORMAL HIGH (ref 70–99)
Potassium: 4.2 mmol/L (ref 3.5–5.1)
Sodium: 137 mmol/L (ref 135–145)
Total Bilirubin: 0.4 mg/dL (ref 0.3–1.2)
Total Protein: 6.1 g/dL — ABNORMAL LOW (ref 6.5–8.1)

## 2021-07-23 LAB — CBC WITH DIFFERENTIAL (CANCER CENTER ONLY)
Abs Immature Granulocytes: 0.04 10*3/uL (ref 0.00–0.07)
Basophils Absolute: 0 10*3/uL (ref 0.0–0.1)
Basophils Relative: 1 %
Eosinophils Absolute: 0.1 10*3/uL (ref 0.0–0.5)
Eosinophils Relative: 1 %
HCT: 27.9 % — ABNORMAL LOW (ref 39.0–52.0)
Hemoglobin: 9.1 g/dL — ABNORMAL LOW (ref 13.0–17.0)
Immature Granulocytes: 1 %
Lymphocytes Relative: 18 %
Lymphs Abs: 0.7 10*3/uL (ref 0.7–4.0)
MCH: 31.9 pg (ref 26.0–34.0)
MCHC: 32.6 g/dL (ref 30.0–36.0)
MCV: 97.9 fL (ref 80.0–100.0)
Monocytes Absolute: 0 10*3/uL — ABNORMAL LOW (ref 0.1–1.0)
Monocytes Relative: 1 %
Neutro Abs: 2.9 10*3/uL (ref 1.7–7.7)
Neutrophils Relative %: 78 %
Platelet Count: 52 10*3/uL — ABNORMAL LOW (ref 150–400)
RBC: 2.85 MIL/uL — ABNORMAL LOW (ref 4.22–5.81)
RDW: 15.6 % — ABNORMAL HIGH (ref 11.5–15.5)
WBC Count: 3.7 10*3/uL — ABNORMAL LOW (ref 4.0–10.5)
nRBC: 0 % (ref 0.0–0.2)

## 2021-07-23 MED ORDER — SODIUM CHLORIDE 0.9% FLUSH
10.0000 mL | Freq: Once | INTRAVENOUS | Status: AC
  Administered 2021-07-23: 10 mL

## 2021-07-23 MED ORDER — HEPARIN SOD (PORK) LOCK FLUSH 100 UNIT/ML IV SOLN
500.0000 [IU] | Freq: Once | INTRAVENOUS | Status: AC
  Administered 2021-07-23: 500 [IU]

## 2021-07-23 NOTE — Progress Notes (Signed)
?    Gerald [name] ?[phone]   [phone] ? ?OFFICE PROGRESS NOTE ? ?[name], DO ?[address] ?[place] [address] ? ?DIAGNOSIS: Extensive stage (T2 a, N2, M1b) small cell lung cancer that was initially diagnosed as synchronous stage Ia non-small cell lung cancer, squamous cell carcinoma involving right upper lobe pulmonary nodule in addition to right lower lobe endobronchial lesion diagnosed in June 2021.  The patient has evidence for disease recurrence and metastasis in December 2022. ? ?PRIOR THERAPY:  ?1) SBRT to the right upper lobe pulmonary nodule as well as the right lower lobe pulmonary nodule under the care of Dr. Sondra Come completed on 11/14/2019. ?2) Palliative systemic chemotherapy with carboplatin for an AUC of 5, etoposide 100 mg/m2, and Imfinzi 1500 mg IV every 3 weeks with Cosela.  First dose expected on 04/16/21.  Status post 4 cycles. ?  ?CURRENT THERAPY: Zepzelca (lurbinectedin) 3.2 Mg/M2 every 3 weeks.  First dose July 16, 2021.  Status post 1 cycle. ? ?INTERVAL HISTORY: ?Gerald [name] 81 y.o. male returns to the clinic today for follow-up visit accompanied by his wife.  The patient tolerated the first week of his treatment well except for fatigue and sleepiness the night of the treatment but he recovered the next day and felt much better.  He continues to have shortness of breath with exertion.  He denied having any nausea, vomiting, diarrhea or constipation.  He has no headache or visual changes.  He has no bleeding, bruises or ecchymosis.  He lost few pounds since his last visit.  The patient is here today for evaluation and repeat blood work. ? ? ?MEDICAL HISTORY: ?Past Medical History:  ?Diagnosis Date  ? Bradycardia   ? Bursitis   ? Carotid artery disease ([place])   ? COPD (chronic obstructive pulmonary disease) ([place])   ? Coronary artery disease   ? CTO mid-dist CX (fills from left-to-left collaterals), 99% OM2, 30% ost-dist LM,  30% prox-mid LAD, 30% ost-mid CX, 99% prox RCA (too small for PCI), medical management 02/09/20  ? Hypercholesterolemia   ? Ischemic cardiomyopathy   ? Lung cancer ([place])   ? Lung nodule   ? nscl ca dx'd 08/2019  ? Psoriasis   ? Tobacco abuse   ? Wears glasses   ? reading  ? ? ?ALLERGIES:  is allergic to cosela [trilaciclib]. ? ?MEDICATIONS:  ?Current Outpatient Medications  ?Medication Sig Dispense Refill  ? albuterol (VENTOLIN HFA) 108 (90 Base) MCG/ACT inhaler Inhale 2 puffs into the lungs every 6 (six) hours as needed for wheezing or shortness of breath. 8 g 2  ? AMBULATORY NON FORMULARY MEDICATION Please provide 4 wheel rolling walker with seat for [employer].  Diagnosis: R26.81 Gait instability 1 Device 0  ? aspirin EC 81 MG tablet Take 1 tablet (81 mg total) by mouth daily. Swallow whole. 90 tablet 3  ? clobetasol cream (TEMOVATE) 9.14 % Apply 1 application topically daily as needed (Psoriasis).    ? Fluticasone-Umeclidin-Vilant (TRELEGY ELLIPTA) 100-62.5-25 MCG/ACT AEPB Inhale 1 puff into the lungs daily. 60 each 11  ? lidocaine-prilocaine (EMLA) cream Apply to the Port-A-Cath site 30-60-minute before chemotherapy. 30 g 0  ? Multiple Vitamins-Minerals (PRESERVISION AREDS 2 PO) Take 1 tablet by mouth in the morning and at bedtime.    ? niacinamide 500 MG tablet Take 500 mg by mouth daily with breakfast.    ? nicotine (NICODERM CQ - DOSED IN MG/24 HOURS) 21 mg/24hr patch  Place 1 patch (21 mg total) onto the skin daily. 28 patch 0  ? predniSONE (DELTASONE) 50 MG tablet Take 1 tablet (50 mg total) by mouth daily. (Patient not taking: Reported on 07/16/2021) 5 tablet 0  ? prochlorperazine (COMPAZINE) 10 MG tablet Take 1 tablet (10 mg total) by mouth every 6 (six) hours as needed. 30 tablet 2  ? rosuvastatin (CRESTOR) 20 MG tablet Take 1 tablet (20 mg total) by mouth daily. 90 tablet 3  ? tamsulosin (FLOMAX) 0.4 MG CAPS capsule Take 0.4 mg by mouth.    ? triamcinolone cream (KENALOG) 0.1 % Apply 1 application  topically daily as needed (skin irritation.).    ? vitamin B-12 (CYANOCOBALAMIN) 500 MCG tablet Take 500 mcg by mouth every evening.    ? ?No current facility-administered medications for this visit.  ? ? ?SURGICAL HISTORY:  ?Past Surgical History:  ?Procedure Laterality Date  ? BRONCHIAL BIOPSY  09/27/2019  ? Procedure: BRONCHIAL BIOPSIES;  Surgeon: Garner Nash, DO;  Location: [facility];  Service: Pulmonary;;  ? BRONCHIAL BRUSHINGS  09/27/2019  ? Procedure: BRONCHIAL BRUSHINGS;  Surgeon: Garner Nash, DO;  Location: [facility];  Service: Pulmonary;;  ? BRONCHIAL NEEDLE ASPIRATION BIOPSY  09/27/2019  ? Procedure: BRONCHIAL NEEDLE ASPIRATION BIOPSIES;  Surgeon: Garner Nash, DO;  Location: [facility];  Service: Pulmonary;;  ? BRONCHIAL NEEDLE ASPIRATION BIOPSY  03/29/2021  ? Procedure: BRONCHIAL NEEDLE ASPIRATION BIOPSIES;  Surgeon: Garner Nash, DO;  Location: [facility];  Service: Pulmonary;;  ? BRONCHIAL WASHINGS  09/27/2019  ? Procedure: BRONCHIAL WASHINGS;  Surgeon: Garner Nash, DO;  Location: [facility];  Service: Pulmonary;;  ? BRONCHIAL WASHINGS  03/29/2021  ? Procedure: BRONCHIAL WASHINGS;  Surgeon: Garner Nash, DO;  Location: [facility];  Service: Pulmonary;;  ? COLONOSCOPY    ? CRYOTHERAPY  09/27/2019  ? Procedure: CRYOTHERAPY;  Surgeon: Garner Nash, DO;  Location: [facility];  Service: Pulmonary;;  ? CRYOTHERAPY  03/29/2021  ? Procedure: CRYOTHERAPY;  Surgeon: Garner Nash, DO;  Location: [facility];  Service: Pulmonary;;  ? FIDUCIAL MARKER PLACEMENT  09/27/2019  ? Procedure: FIDUCIAL MARKER PLACEMENT;  Surgeon: Garner Nash, DO;  Location: [facility];  Service: Pulmonary;;  ? HEMOSTASIS CONTROL  09/27/2019  ? Procedure: HEMOSTASIS CONTROL;  Surgeon: Garner Nash, DO;  Location: [facility];  Service: Pulmonary;;  ? HERNIA REPAIR    ? umbicial- no longer as a "belly button"  ? IR IMAGING GUIDED PORT INSERTION  05/21/2021  ? IR US GUIDE VASC  ACCESS LEFT  05/21/2021  ? RIGHT/LEFT HEART CATH AND CORONARY ANGIOGRAPHY N/A 02/09/2020  ? Procedure: RIGHT/LEFT HEART CATH AND CORONARY ANGIOGRAPHY;  Surgeon: Burnell Blanks, MD;  Location: [facility];  Service: Cardiovascular;  Laterality: N/A;  ? VIDEO BRONCHOSCOPY WITH ENDOBRONCHIAL NAVIGATION N/A 09/27/2019  ? Procedure: VIDEO BRONCHOSCOPY WITH ENDOBRONCHIAL NAVIGATION;  Surgeon: Garner Nash, DO;  Location: [facility];  Service: Pulmonary;  Laterality: N/A;  ? VIDEO BRONCHOSCOPY WITH ENDOBRONCHIAL ULTRASOUND N/A 03/29/2021  ? Procedure: VIDEO BRONCHOSCOPY WITH ENDOBRONCHIAL ULTRASOUND;  Surgeon: Garner Nash, DO;  Location: [facility];  Service: Pulmonary;  Laterality: N/A;  ? ? ?REVIEW OF SYSTEMS:  A comprehensive review of systems was negative except for: Constitutional: positive for fatigue ?Respiratory: positive for dyspnea on exertion ?Musculoskeletal: positive for muscle weakness  ? ?PHYSICAL EXAMINATION: General appearance: alert, cooperative, fatigued, and no distress ?Head: Normocephalic, without obvious abnormality, atraumatic ?Neck: no adenopathy, no JVD, supple, symmetrical, trachea  midline, and thyroid not enlarged, symmetric, no tenderness/mass/nodules ?Lymph nodes: Cervical, supraclavicular, and axillary nodes normal. ?Resp: clear to auscultation bilaterally ?Back: symmetric, no curvature. ROM normal. No CVA tenderness. ?Cardio: regular rate and rhythm, S1, S2 normal, no murmur, click, rub or gallop ?GI: soft, non-tender; bowel sounds normal; no masses,  no organomegaly ?Extremities: extremities normal, atraumatic, no cyanosis or edema ? ?ECOG PERFORMANCE STATUS: 1 - Symptomatic but completely ambulatory ? ?Blood pressure (!) 102/58, pulse 93, temperature 97.6 ?F (36.4 ?C), temperature source Oral, resp. rate 18, weight 202 lb 6 oz (91.8 kg), SpO2 99 %. ? ?LABORATORY DATA: ?Lab Results  ?Component Value Date  ? WBC 3.7 (L) 07/23/2021  ? HGB 9.1 (L) 07/23/2021  ? HCT  27.9 (L) 07/23/2021  ? MCV 97.9 07/23/2021  ? PLT 52 (L) 07/23/2021  ? ? ?  Chemistry   ?   ?Component Value Date/Time  ? NA 133 (L) 07/16/2021 1240  ? NA 139 02/07/2020 1208  ? K 3.9 07/16/2021 1240  ? CL

## 2021-07-24 ENCOUNTER — Ambulatory Visit (INDEPENDENT_AMBULATORY_CARE_PROVIDER_SITE_OTHER): Payer: Medicare Other | Admitting: Family Medicine

## 2021-07-24 ENCOUNTER — Encounter: Payer: Self-pay | Admitting: Family Medicine

## 2021-07-24 VITALS — BP 108/70 | HR 92 | Ht 72.0 in | Wt 203.0 lb

## 2021-07-24 DIAGNOSIS — N179 Acute kidney failure, unspecified: Secondary | ICD-10-CM | POA: Diagnosis not present

## 2021-07-24 DIAGNOSIS — I10 Essential (primary) hypertension: Secondary | ICD-10-CM | POA: Diagnosis not present

## 2021-07-24 DIAGNOSIS — K5900 Constipation, unspecified: Secondary | ICD-10-CM

## 2021-07-24 DIAGNOSIS — R338 Other retention of urine: Secondary | ICD-10-CM | POA: Diagnosis not present

## 2021-07-24 DIAGNOSIS — R009 Unspecified abnormalities of heart beat: Secondary | ICD-10-CM

## 2021-07-24 LAB — POCT URINALYSIS DIP (CLINITEK)
Bilirubin, UA: NEGATIVE
Blood, UA: NEGATIVE
Glucose, UA: NEGATIVE mg/dL
Ketones, POC UA: NEGATIVE mg/dL
Leukocytes, UA: NEGATIVE
Nitrite, UA: NEGATIVE
POC PROTEIN,UA: NEGATIVE
Spec Grav, UA: 1.02 (ref 1.010–1.025)
Urobilinogen, UA: 1 E.U./dL
pH, UA: 6.5 (ref 5.0–8.0)

## 2021-07-24 NOTE — Progress Notes (Signed)
?Gerald [name] - 81 y.o. male MRN [id number]  Date of birth: 05-26-1940 ? ?Subjective ?Chief Complaint  ?Patient presents with  ? Follow-up  ? ? ?HPI ?Gerald [name] is an 81 year old male here today to discuss elevated heart rate.  Noted to have elevated heart rate after his previous chemoinfusion.  Heart rate was in the 110 range.  He denies any symptoms related to this including chest pain, shortness of breath or palpitations.  It has returned back to his baseline 80-90 at this point.  He did also have some fatigue after his last infusion.  Previous labs with pancytopenia, this is followed weekly. ? ?Has noted to have some constipation as well related to his chemo regimen.  He is currently managing with prune juice would like to discuss other alternatives if this becomes ineffective. ? ?He does continue to have some difficulty with voiding.  Previously had Foley catheter.  He does remain on Flomax.  Denies dysuria. ? ?ROS:  A comprehensive ROS was completed and negative except as noted per HPI ? ? ? ?Allergies  ?Allergen Reactions  ? Cosela [Trilaciclib] Other (See Comments)  ?  thrombophlebitis  ? ? ?Past Medical History:  ?Diagnosis Date  ? Bradycardia   ? Bursitis   ? Carotid artery disease ([place])   ? COPD (chronic obstructive pulmonary disease) ([place])   ? Coronary artery disease   ? CTO mid-dist CX (fills from left-to-left collaterals), 99% OM2, 30% ost-dist LM, 30% prox-mid LAD, 30% ost-mid CX, 99% prox RCA (too small for PCI), medical management 02/09/20  ? Hypercholesterolemia   ? Ischemic cardiomyopathy   ? Lung cancer ([place])   ? Lung nodule   ? nscl ca dx'd 08/2019  ? Psoriasis   ? Tobacco abuse   ? Wears glasses   ? reading  ? ? ?Past Surgical History:  ?Procedure Laterality Date  ? BRONCHIAL BIOPSY  09/27/2019  ? Procedure: BRONCHIAL BIOPSIES;  Surgeon: Garner Nash, DO;  Location: [facility];  Service: Pulmonary;;  ? BRONCHIAL BRUSHINGS  09/27/2019  ? Procedure: BRONCHIAL BRUSHINGS;  Surgeon: Garner Nash,  DO;  Location: [facility];  Service: Pulmonary;;  ? BRONCHIAL NEEDLE ASPIRATION BIOPSY  09/27/2019  ? Procedure: BRONCHIAL NEEDLE ASPIRATION BIOPSIES;  Surgeon: Garner Nash, DO;  Location: [facility];  Service: Pulmonary;;  ? BRONCHIAL NEEDLE ASPIRATION BIOPSY  03/29/2021  ? Procedure: BRONCHIAL NEEDLE ASPIRATION BIOPSIES;  Surgeon: Garner Nash, DO;  Location: [facility];  Service: Pulmonary;;  ? BRONCHIAL WASHINGS  09/27/2019  ? Procedure: BRONCHIAL WASHINGS;  Surgeon: Garner Nash, DO;  Location: [facility];  Service: Pulmonary;;  ? BRONCHIAL WASHINGS  03/29/2021  ? Procedure: BRONCHIAL WASHINGS;  Surgeon: Garner Nash, DO;  Location: [facility];  Service: Pulmonary;;  ? COLONOSCOPY    ? CRYOTHERAPY  09/27/2019  ? Procedure: CRYOTHERAPY;  Surgeon: Garner Nash, DO;  Location: [facility];  Service: Pulmonary;;  ? CRYOTHERAPY  03/29/2021  ? Procedure: CRYOTHERAPY;  Surgeon: Garner Nash, DO;  Location: [facility];  Service: Pulmonary;;  ? FIDUCIAL MARKER PLACEMENT  09/27/2019  ? Procedure: FIDUCIAL MARKER PLACEMENT;  Surgeon: Garner Nash, DO;  Location: [facility];  Service: Pulmonary;;  ? HEMOSTASIS CONTROL  09/27/2019  ? Procedure: HEMOSTASIS CONTROL;  Surgeon: Garner Nash, DO;  Location: [facility];  Service: Pulmonary;;  ? HERNIA REPAIR    ? umbicial- no longer as a "belly button"  ? IR IMAGING GUIDED PORT INSERTION  05/21/2021  ? IR US GUIDE VASC  ACCESS LEFT  05/21/2021  ? RIGHT/LEFT HEART CATH AND CORONARY ANGIOGRAPHY N/A 02/09/2020  ? Procedure: RIGHT/LEFT HEART CATH AND CORONARY ANGIOGRAPHY;  Surgeon: Burnell Blanks, MD;  Location: [facility];  Service: Cardiovascular;  Laterality: N/A;  ? VIDEO BRONCHOSCOPY WITH ENDOBRONCHIAL NAVIGATION N/A 09/27/2019  ? Procedure: VIDEO BRONCHOSCOPY WITH ENDOBRONCHIAL NAVIGATION;  Surgeon: Garner Nash, DO;  Location: [facility];  Service: Pulmonary;  Laterality: N/A;  ? VIDEO BRONCHOSCOPY WITH  ENDOBRONCHIAL ULTRASOUND N/A 03/29/2021  ? Procedure: VIDEO BRONCHOSCOPY WITH ENDOBRONCHIAL ULTRASOUND;  Surgeon: Garner Nash, DO;  Location: [facility];  Service: Pulmonary;  Laterality: N/A;  ? ? ?Social History  ? ?Socioeconomic History  ? Marital status: Married  ?  Spouse name: Not on file  ? Number of children: Not on file  ? Years of education: Not on file  ? Highest education level: Not on file  ?Occupational History  ? Not on file  ?Tobacco Use  ? Smoking status: Every Day  ?  Packs/day: 2.00  ?  Years: 62.00  ?  Pack years: 124.00  ?  Types: Cigarettes  ?  Passive exposure: Past  ? Smokeless tobacco: Never  ? Tobacco comments:  ?  Pt currently using nicotine patch.  ?Vaping Use  ? Vaping Use: Never used  ?Substance and Sexual Activity  ? Alcohol use: Not Currently  ?  Comment: rare  ? Drug use: Never  ? Sexual activity: Not Currently  ?Other Topics Concern  ? Not on file  ?Social History Narrative  ? Not on file  ? ?Social Determinants of Health  ? ?Financial Resource Strain: Not on file  ?Food Insecurity: Not on file  ?Transportation Needs: Not on file  ?Physical Activity: Not on file  ?Stress: Not on file  ?Social Connections: Not on file  ? ? ?Family History  ?Problem Relation Age of Onset  ? Heart attack Father   ? Mesothelioma Sister 58  ? Lung cancer Brother   ? ? ?Health Maintenance  ?Topic Date Due  ? COVID-19 Vaccine (3 - Moderna risk series) 08/09/2021 (Originally 06/22/2019)  ? Zoster Vaccines- Shingrix (1 of 2) 11/29/2021 (Originally 07/24/1959)  ? Pneumonia Vaccine 84+ Years old (2 - PPSV23 if available, else PCV20) 07/25/2022 (Originally 03/06/2016)  ? TETANUS/TDAP  07/25/2022 (Originally 07/24/1959)  ? INFLUENZA VACCINE  10/29/2021  ? HPV VACCINES  Aged Out  ? ? ? ?----------------------------------------------------------------------------------------------------------------------------------------------------------------------------------------------------------------- ?Physical  Exam ?BP 108/70 (BP Location: Left Arm, Patient Position: Sitting, Cuff Size: Normal)   Pulse 92   Ht 6' (1.829 m)   Wt 203 lb (92.1 kg)   SpO2 96%   BMI 27.53 kg/m?  ? ?Physical Exam ?Constitutional:   ?   Appearance: Normal appearance.  ?Cardiovascular:  ?   Rate and Rhythm: Normal rate and regular rhythm.  ?Pulmonary:  ?   Effort: Pulmonary effort is normal.  ?   Breath sounds: Normal breath sounds.  ?Neurological:  ?   Mental Status: He is alert.  ?Psychiatric:     ?   Mood and Affect: Mood normal.     ?   Behavior: Behavior normal.  ? ? ?------------------------------------------------------------------------------------------------------------------------------------------------------------------------------------------------------------------- ?Assessment and Plan ? ?Acute urinary retention ?No evidence of difficulty with voiding.  Continue Flomax.  Recheck urinalysis.  May need referral back to urology to rule out urethral stricture. ? ?Elevated heart rate with elevated blood pressure and diagnosis of hypertension ?Noted to have some tachycardia after previous chemoinfusion.  Asymptomatic with this.  This has resolved  at this point.  Recommend remaining well-hydrated. ? ?Constipation ?Constipation related to the chemotherapy regimen.  He may continue prune juice as needed.  Alternatively he may use MiraLAX or magnesium citrate for severe constipation. ? ? ?No orders of the defined types were placed in this encounter. ? ? ?No follow-ups on file. ? ? ? ?This visit occurred during the SARS-CoV-2 public health emergency.  Safety protocols were in place, including screening questions prior to the visit, additional usage of staff PPE, and extensive cleaning of exam room while observing appropriate contact time as indicated for disinfecting solutions.  ? ?

## 2021-07-24 NOTE — Assessment & Plan Note (Signed)
No evidence of difficulty with voiding.  Continue Flomax.  Recheck urinalysis.  May need referral back to urology to rule out urethral stricture. ?

## 2021-07-24 NOTE — Assessment & Plan Note (Addendum)
Noted to have some tachycardia after previous chemoinfusion.  Asymptomatic with this.  This has resolved at this point.  Recommend remaining well-hydrated. ?

## 2021-07-24 NOTE — Patient Instructions (Signed)
Happy Birthday! ?Please bring back urine sample.  ?Try tylenol arthritis for knee pain.   ?I would continue the flomax for now.   ? ?

## 2021-07-24 NOTE — Assessment & Plan Note (Signed)
Constipation related to the chemotherapy regimen.  He may continue prune juice as needed.  Alternatively he may use MiraLAX or magnesium citrate for severe constipation. ?

## 2021-07-26 ENCOUNTER — Emergency Department (HOSPITAL_BASED_OUTPATIENT_CLINIC_OR_DEPARTMENT_OTHER): Payer: Medicare Other

## 2021-07-26 ENCOUNTER — Encounter (HOSPITAL_BASED_OUTPATIENT_CLINIC_OR_DEPARTMENT_OTHER): Payer: Self-pay

## 2021-07-26 ENCOUNTER — Inpatient Hospital Stay (HOSPITAL_BASED_OUTPATIENT_CLINIC_OR_DEPARTMENT_OTHER)
Admission: EM | Admit: 2021-07-26 | Discharge: 2021-07-31 | DRG: 871 | Disposition: A | Discharge status: Home Health | Payer: Medicare Other | Attending: Internal Medicine | Admitting: Internal Medicine

## 2021-07-26 ENCOUNTER — Other Ambulatory Visit: Payer: Self-pay

## 2021-07-26 DIAGNOSIS — A419 Sepsis, unspecified organism: Secondary | ICD-10-CM | POA: Diagnosis not present

## 2021-07-26 DIAGNOSIS — D701 Agranulocytosis secondary to cancer chemotherapy: Secondary | ICD-10-CM | POA: Diagnosis present

## 2021-07-26 DIAGNOSIS — Z20822 Contact with and (suspected) exposure to covid-19: Secondary | ICD-10-CM | POA: Diagnosis present

## 2021-07-26 DIAGNOSIS — I2584 Coronary atherosclerosis due to calcified coronary lesion: Secondary | ICD-10-CM | POA: Diagnosis present

## 2021-07-26 DIAGNOSIS — Z888 Allergy status to other drugs, medicaments and biological substances status: Secondary | ICD-10-CM

## 2021-07-26 DIAGNOSIS — D709 Neutropenia, unspecified: Secondary | ICD-10-CM | POA: Diagnosis present

## 2021-07-26 DIAGNOSIS — I255 Ischemic cardiomyopathy: Secondary | ICD-10-CM | POA: Diagnosis present

## 2021-07-26 DIAGNOSIS — T451X5A Adverse effect of antineoplastic and immunosuppressive drugs, initial encounter: Secondary | ICD-10-CM | POA: Diagnosis present

## 2021-07-26 DIAGNOSIS — L409 Psoriasis, unspecified: Secondary | ICD-10-CM | POA: Diagnosis present

## 2021-07-26 DIAGNOSIS — I10 Essential (primary) hypertension: Secondary | ICD-10-CM

## 2021-07-26 DIAGNOSIS — J449 Chronic obstructive pulmonary disease, unspecified: Secondary | ICD-10-CM

## 2021-07-26 DIAGNOSIS — R5081 Fever presenting with conditions classified elsewhere: Secondary | ICD-10-CM | POA: Diagnosis present

## 2021-07-26 DIAGNOSIS — J188 Other pneumonia, unspecified organism: Secondary | ICD-10-CM

## 2021-07-26 DIAGNOSIS — R509 Fever, unspecified: Principal | ICD-10-CM

## 2021-07-26 DIAGNOSIS — J189 Pneumonia, unspecified organism: Secondary | ICD-10-CM | POA: Diagnosis present

## 2021-07-26 DIAGNOSIS — Z7982 Long term (current) use of aspirin: Secondary | ICD-10-CM

## 2021-07-26 DIAGNOSIS — I2510 Atherosclerotic heart disease of native coronary artery without angina pectoris: Secondary | ICD-10-CM | POA: Diagnosis present

## 2021-07-26 DIAGNOSIS — J441 Chronic obstructive pulmonary disease with (acute) exacerbation: Secondary | ICD-10-CM | POA: Diagnosis present

## 2021-07-26 DIAGNOSIS — E7800 Pure hypercholesterolemia, unspecified: Secondary | ICD-10-CM | POA: Diagnosis present

## 2021-07-26 DIAGNOSIS — Z79899 Other long term (current) drug therapy: Secondary | ICD-10-CM

## 2021-07-26 DIAGNOSIS — D61810 Antineoplastic chemotherapy induced pancytopenia: Secondary | ICD-10-CM | POA: Diagnosis present

## 2021-07-26 DIAGNOSIS — Z801 Family history of malignant neoplasm of trachea, bronchus and lung: Secondary | ICD-10-CM

## 2021-07-26 DIAGNOSIS — R339 Retention of urine, unspecified: Secondary | ICD-10-CM | POA: Diagnosis present

## 2021-07-26 DIAGNOSIS — F17210 Nicotine dependence, cigarettes, uncomplicated: Secondary | ICD-10-CM | POA: Diagnosis present

## 2021-07-26 DIAGNOSIS — C3411 Malignant neoplasm of upper lobe, right bronchus or lung: Secondary | ICD-10-CM | POA: Diagnosis present

## 2021-07-26 DIAGNOSIS — J440 Chronic obstructive pulmonary disease with acute lower respiratory infection: Secondary | ICD-10-CM | POA: Diagnosis present

## 2021-07-26 DIAGNOSIS — Z7951 Long term (current) use of inhaled steroids: Secondary | ICD-10-CM

## 2021-07-26 DIAGNOSIS — E785 Hyperlipidemia, unspecified: Secondary | ICD-10-CM

## 2021-07-26 DIAGNOSIS — C3490 Malignant neoplasm of unspecified part of unspecified bronchus or lung: Secondary | ICD-10-CM | POA: Diagnosis present

## 2021-07-26 DIAGNOSIS — C787 Secondary malignant neoplasm of liver and intrahepatic bile duct: Secondary | ICD-10-CM | POA: Diagnosis present

## 2021-07-26 DIAGNOSIS — D61818 Other pancytopenia: Secondary | ICD-10-CM | POA: Diagnosis present

## 2021-07-26 DIAGNOSIS — Z8249 Family history of ischemic heart disease and other diseases of the circulatory system: Secondary | ICD-10-CM

## 2021-07-26 DIAGNOSIS — Z87898 Personal history of other specified conditions: Secondary | ICD-10-CM

## 2021-07-26 LAB — CBC WITH DIFFERENTIAL/PLATELET
Abs Immature Granulocytes: 0 10*3/uL (ref 0.00–0.07)
Basophils Absolute: 0 10*3/uL (ref 0.0–0.1)
Basophils Relative: 2 %
Eosinophils Absolute: 0 10*3/uL (ref 0.0–0.5)
Eosinophils Relative: 0 %
HCT: 29.3 % — ABNORMAL LOW (ref 39.0–52.0)
Hemoglobin: 9.8 g/dL — ABNORMAL LOW (ref 13.0–17.0)
Lymphocytes Relative: 54 %
Lymphs Abs: 0.4 10*3/uL — ABNORMAL LOW (ref 0.7–4.0)
MCH: 32.5 pg (ref 26.0–34.0)
MCHC: 33.4 g/dL (ref 30.0–36.0)
MCV: 97 fL (ref 80.0–100.0)
Monocytes Absolute: 0.1 10*3/uL (ref 0.1–1.0)
Monocytes Relative: 16 %
Neutro Abs: 0.2 10*3/uL — CL (ref 1.7–7.7)
Neutrophils Relative %: 28 %
Platelets: 57 10*3/uL — ABNORMAL LOW (ref 150–400)
RBC: 3.02 MIL/uL — ABNORMAL LOW (ref 4.22–5.81)
RDW: 15.9 % — ABNORMAL HIGH (ref 11.5–15.5)
WBC: 0.7 10*3/uL — CL (ref 4.0–10.5)
nRBC: 0 % (ref 0.0–0.2)

## 2021-07-26 LAB — COMPREHENSIVE METABOLIC PANEL
ALT: 49 U/L — ABNORMAL HIGH (ref 0–44)
AST: 17 U/L (ref 15–41)
Albumin: 3.8 g/dL (ref 3.5–5.0)
Alkaline Phosphatase: 79 U/L (ref 38–126)
Anion gap: 11 (ref 5–15)
BUN: 16 mg/dL (ref 8–23)
CO2: 23 mmol/L (ref 22–32)
Calcium: 9.1 mg/dL (ref 8.9–10.3)
Chloride: 100 mmol/L (ref 98–111)
Creatinine, Ser: 1.05 mg/dL (ref 0.61–1.24)
GFR, Estimated: >60 mL/min (ref >60)
Glucose, Bld: 120 mg/dL — ABNORMAL HIGH (ref 70–99)
Potassium: 4.1 mmol/L (ref 3.5–5.1)
Sodium: 134 mmol/L — ABNORMAL LOW (ref 135–145)
Total Bilirubin: 0.9 mg/dL (ref 0.3–1.2)
Total Protein: 6.9 g/dL (ref 6.5–8.1)

## 2021-07-26 LAB — RESP PANEL BY RT-PCR (FLU A&B, COVID) ARPGX2
Influenza A by PCR: NEGATIVE
Influenza B by PCR: NEGATIVE
SARS Coronavirus 2 by RT PCR: NEGATIVE

## 2021-07-26 LAB — PROTIME-INR
INR: 1.1 (ref 0.8–1.2)
Prothrombin Time: 13.6 seconds (ref 11.4–15.2)

## 2021-07-26 LAB — LACTIC ACID, PLASMA: Lactic Acid, Venous: 2.1 mmol/L (ref 0.5–1.9)

## 2021-07-26 LAB — APTT: aPTT: 31 seconds (ref 24–36)

## 2021-07-26 MED ORDER — METRONIDAZOLE 500 MG/100ML IV SOLN
500.0000 mg | Freq: Once | INTRAVENOUS | Status: AC
  Administered 2021-07-26: 500 mg via INTRAVENOUS
  Filled 2021-07-26: qty 100

## 2021-07-26 MED ORDER — IOHEXOL 350 MG/ML SOLN
100.0000 mL | Freq: Once | INTRAVENOUS | Status: AC | PRN
  Administered 2021-07-26: 90 mL via INTRAVENOUS

## 2021-07-26 MED ORDER — LACTATED RINGERS IV BOLUS (SEPSIS): 1000.0000 mL | Freq: Once | INTRAVENOUS | Status: DC

## 2021-07-26 MED ORDER — VANCOMYCIN HCL IN DEXTROSE 1-5 GM/200ML-% IV SOLN
1000.0000 mg | Freq: Once | INTRAVENOUS | Status: DC
Start: 2021-07-26 — End: 2021-07-27

## 2021-07-26 MED ORDER — LACTATED RINGERS IV BOLUS (SEPSIS)
1000.0000 mL | Freq: Once | INTRAVENOUS | Status: AC
  Administered 2021-07-26: 1000 mL via INTRAVENOUS

## 2021-07-26 MED ORDER — VANCOMYCIN HCL IN DEXTROSE 1-5 GM/200ML-% IV SOLN: 1000.0000 mg | Freq: Once | INTRAVENOUS | Status: DC

## 2021-07-26 MED ORDER — VANCOMYCIN HCL 1750 MG/350ML IV SOLN
1750.0000 mg | INTRAVENOUS | Status: DC
  Filled 2021-07-26: qty 350

## 2021-07-26 MED ORDER — SODIUM CHLORIDE 0.9 % IV SOLN
2.0000 g | Freq: Once | INTRAVENOUS | Status: AC
  Administered 2021-07-26: 2 g via INTRAVENOUS
  Filled 2021-07-26: qty 12.5

## 2021-07-26 MED ORDER — LACTATED RINGERS IV SOLN: INTRAVENOUS | Status: AC

## 2021-07-26 NOTE — ED Notes (Signed)
Date and time results received: 07/26/21 2155 ? ?(use smartphrase ".now" to insert current time) ? ?Test: wbc  neutro ?Critical Value: wbc 0.7  neutro 0.2 ? ?Name of Provider Notified: Rolan Lipa, MD ? ?Orders Received? Or Actions Taken?:  n/a ?

## 2021-07-26 NOTE — Progress Notes (Signed)
Pharmacy Antibiotic Note ? ?Gerald [name] is a 81 y.o. male admitted on 07/26/2021 with sepsis.  Pharmacy has been consulted for Cefepime and vancomycin dosing. ? ?WBC low, SCr wnl  ? ?Plan: ?-Cefepime 2 gm IV Q 12 hours  ?-Vancomycin 2 gm IV load followed by Vancomycin 1750 mg IV Q 24 hrs. Goal AUC 400-550. ?Expected AUC: 498 ?SCr used: 1.05 ?-Monitor CBC, renal fx, cultures and clinical progress ?-Vanc levels as indicated  ? ? ?Height: 6' (182.9 cm) ?Weight: 92.1 kg (203 lb) ?IBW/kg (Calculated) : 77.6 ? ?Temp (24hrs), Avg:100.5 ?F (38.1 ?C), Min:100.5 ?F (38.1 ?C), Max:100.5 ?F (38.1 ?C) ? ?Recent Labs  ?Lab 07/23/21 ?1455 07/26/21 ?2114  ?WBC 3.7* 0.7*  ?CREATININE 0.95 1.05  ?LATICACIDVEN  --  2.1*  ?  ?Estimated Creatinine Clearance: 58.5 mL/min (by C-G formula based on SCr of 1.05 mg/dL).   ? ?Allergies  ?Allergen Reactions  ? Cosela [Trilaciclib] Other (See Comments)  ?  thrombophlebitis  ? ? ?Antimicrobials this admission: ?Cefepime 4/28 >>  ?Vancomycin 4/28 >>  ?Flagyl 4/28 >>  ? ?Dose adjustments this admission: ? ? ?Microbiology results: ?4/28 BCx:  ?4/28 UCx:   ? ? ?Thank you for allowing pharmacy to be a part of this patient?s care. ? ?Gerald [name], PharmD., BCCCP ?Clinical Pharmacist ?Please refer to [facility] for unit-specific pharmacist  ? ? ?

## 2021-07-26 NOTE — ED Notes (Signed)
Date and time results received: 07/26/21 2206 ?(use smartphrase ".now" to insert current time) ? ?Test: lactic acid ?Critical Value: 2.1 ? ?Name of Provider Notified: Rolan Lipa, MD ? ?Orders Received? Or Actions Taken?:  n/a ?

## 2021-07-26 NOTE — Progress Notes (Signed)
  [facility] will assume care on arrival to accepting facility. Until arrival, care as per EDP. However, [facility] available 24/7 for questions and assistance.   Nursing staff please page [facility] Admits and Consults ([phone]) as soon as the patient arrives to the hospital.  Oday Ridings, DO [facility]  

## 2021-07-26 NOTE — ED Provider Notes (Signed)
?[facility] ?Provider Note ? ? ?CSN: [id number] ?Arrival date & time: 07/26/21  2053 ? ?  ? ?History ? ?Chief Complaint  ?Patient presents with  ? Shortness of Breath  ? ? ?Gerald [name] is a 81 y.o. male. ? ?HPI ? ?  ? ?Generalized weakness, tonight couldn't even sit up, began just prior to arrival ?1 week ago Tuesday chemo ?Shortness of breath began [birth year] his birthday ?No increased cough ?Fever at home ?No nausea or vomiting, no urinary symptoms (had urine yesterday because not going as much) ?No abdominal pain ?No diarrhea, no sore throat  ?Runny nose ?Has O2 at home to use as needed but doesn't normally need it  ? ?Past Medical History:  ?Diagnosis Date  ? Bradycardia   ? Bursitis   ? Carotid artery disease ([place])   ? COPD (chronic obstructive pulmonary disease) ([place])   ? Coronary artery disease   ? CTO mid-dist CX (fills from left-to-left collaterals), 99% OM2, 30% ost-dist LM, 30% prox-mid LAD, 30% ost-mid CX, 99% prox RCA (too small for PCI), medical management 02/09/20  ? Hypercholesterolemia   ? Ischemic cardiomyopathy   ? Lung cancer ([place])   ? Lung nodule   ? nscl ca dx'd 08/2019  ? Psoriasis   ? Tobacco abuse   ? Wears glasses   ? reading  ?  ?Home Medications ?Prior to Admission medications   ?Medication Sig Start Date End Date Taking? Authorizing Provider  ?albuterol (VENTOLIN HFA) 108 (90 Base) MCG/ACT inhaler Inhale 2 puffs into the lungs every 6 (six) hours as needed for wheezing or shortness of breath. 04/30/21  Yes Elodia Florence., MD  ?aspirin EC 81 MG tablet Take 1 tablet (81 mg total) by mouth daily. Swallow whole. 01/26/20  Yes Buford Dresser, MD  ?clobetasol cream (TEMOVATE) 1.69 % Apply 1 application topically daily as needed (Psoriasis).   Yes [provider]  ?Fluticasone-Umeclidin-Vilant (TRELEGY ELLIPTA) 100-62.5-25 MCG/ACT AEPB Inhale 1 puff into the lungs daily. 06/20/21 09/18/21 Yes Icard, Octavio Graves, DO  ?lidocaine-prilocaine (EMLA)  cream Apply to the Port-A-Cath site 30-60-minute before chemotherapy. 04/16/21  Yes Curt Bears, MD  ?Multiple Vitamins-Minerals (PRESERVISION AREDS 2 PO) Take 1 tablet by mouth in the morning and at bedtime.   Yes [provider]  ?niacinamide 500 MG tablet Take 500 mg by mouth daily with breakfast.   Yes [provider]  ?nicotine (NICODERM CQ - DOSED IN MG/24 HOURS) 14 mg/24hr patch Place 14 mg onto the skin daily.   Yes [provider]  ?prochlorperazine (COMPAZINE) 10 MG tablet Take 1 tablet (10 mg total) by mouth every 6 (six) hours as needed. 04/10/21  Yes Heilingoetter, Cassandra L, PA-C  ?rosuvastatin (CRESTOR) 20 MG tablet Take 1 tablet (20 mg total) by mouth daily. 06/10/21 06/05/22 Yes Buford Dresser, MD  ?tamsulosin (FLOMAX) 0.4 MG CAPS capsule Take 0.4 mg by mouth.   Yes [provider]  ?triamcinolone cream (KENALOG) 0.1 % Apply 1 application topically daily as needed (skin irritation.). 02/01/21  Yes [provider]  ?vitamin B-12 (CYANOCOBALAMIN) 500 MCG tablet Take 500 mcg by mouth every evening.   Yes [provider]  ?AMBULATORY NON FORMULARY MEDICATION Please provide 4 wheel rolling walker with seat for [employer].  Diagnosis: R26.81 Gait instability 05/10/21   [name], DO  ?   ? ?Allergies    ?Cosela [trilaciclib]   ? ?Review of Systems   ?Review of Systems ? ?Physical Exam ?Updated Vital Signs ?BP Marland Kitchen)  102/39   Pulse 74   Temp 98.5 ?F (36.9 ?C) (Oral)   Resp 12   Ht 6' (1.829 m)   Wt 93.8 kg   SpO2 94%   BMI 28.05 kg/m?  ?Physical Exam ?Vitals and nursing note reviewed.  ?Constitutional:   ?   General: He is not in acute distress. ?   Appearance: He is well-developed. He is ill-appearing. He is not diaphoretic.  ?HENT:  ?   Head: Normocephalic and atraumatic.  ?Eyes:  ?   Conjunctiva/sclera: Conjunctivae normal.  ?Cardiovascular:  ?   Rate and Rhythm: Regular rhythm. Tachycardia present.  ?   Heart sounds: Normal heart  sounds. No murmur heard. ?  No friction rub. No gallop.  ?Pulmonary:  ?   Effort: Pulmonary effort is normal. No respiratory distress.  ?   Breath sounds: Decreased breath sounds present. No wheezing or rales.  ?Abdominal:  ?   General: There is no distension.  ?   Palpations: Abdomen is soft.  ?   Tenderness: There is no abdominal tenderness. There is no guarding.  ?Musculoskeletal:  ?   Cervical back: Normal range of motion.  ?Skin: ?   General: Skin is warm and dry.  ?Neurological:  ?   Mental Status: He is alert and oriented to person, place, and time.  ? ? ?ED Results / Procedures / Treatments   ?Labs ?(all labs ordered are listed, but only abnormal results are displayed) ?Labs Reviewed  ?MRSA NEXT GEN BY PCR, NASAL - Abnormal; Notable for the following components:  ?    Result Value  ? MRSA by PCR Next Gen DETECTED (*)   ? All other components within normal limits  ?COMPREHENSIVE METABOLIC PANEL - Abnormal; Notable for the following components:  ? Sodium 134 (*)   ? Glucose, Bld 120 (*)   ? ALT 49 (*)   ? All other components within normal limits  ?LACTIC ACID, PLASMA - Abnormal; Notable for the following components:  ? Lactic Acid, Venous 2.1 (*)   ? All other components within normal limits  ?CBC WITH DIFFERENTIAL/PLATELET - Abnormal; Notable for the following components:  ? WBC 0.7 (*)   ? RBC 3.02 (*)   ? Hemoglobin 9.8 (*)   ? HCT 29.3 (*)   ? RDW 15.9 (*)   ? Platelets 57 (*)   ? Neutro Abs 0.2 (*)   ? Lymphs Abs 0.4 (*)   ? All other components within normal limits  ?URINALYSIS, ROUTINE W REFLEX MICROSCOPIC - Abnormal; Notable for the following components:  ? Specific Gravity, Urine 1.040 (*)   ? All other components within normal limits  ?COMPREHENSIVE METABOLIC PANEL - Abnormal; Notable for the following components:  ? Glucose, Bld 101 (*)   ? Calcium 8.2 (*)   ? Total Protein 5.7 (*)   ? Albumin 2.5 (*)   ? Anion gap 4 (*)   ? All other components within normal limits  ?CBC WITH DIFFERENTIAL/PLATELET  - Abnormal; Notable for the following components:  ? WBC 0.8 (*)   ? RBC 2.65 (*)   ? Hemoglobin 8.6 (*)   ? HCT 26.2 (*)   ? RDW 16.1 (*)   ? Platelets 50 (*)   ? Neutro Abs 0.1 (*)   ? Lymphs Abs 0.4 (*)   ? All other components within normal limits  ?RESP PANEL BY RT-PCR (FLU A&B, COVID) [id number]  ?CULTURE, BLOOD (ROUTINE X 2)  ?CULTURE, BLOOD (ROUTINE X 2)  ?URINE CULTURE  ?  LACTIC ACID, PLASMA  ?PROTIME-INR  ?APTT  ?PATHOLOGIST SMEAR REVIEW  ? ? ?EKG ?None ? ?Radiology ?CT Angio Chest PE W and/or Wo Contrast ? ?Result Date: 07/26/2021 ?CLINICAL DATA:  Shortness of breath. EXAM: CT ANGIOGRAPHY CHEST WITH CONTRAST TECHNIQUE: Multidetector CT imaging of the chest was performed using the standard protocol during bolus administration of intravenous contrast. Multiplanar CT image reconstructions and MIPs were obtained to evaluate the vascular anatomy. RADIATION DOSE REDUCTION: This exam was performed according to the departmental dose-optimization program which includes automated exposure control, adjustment of the mA and/or kV according to patient size and/or use of iterative reconstruction technique. CONTRAST:  61mL OMNIPAQUE IOHEXOL 350 MG/ML SOLN COMPARISON:  July 04, 2021 FINDINGS: Cardiovascular: A left-sided venous Port-A-Cath is seen. There is marked severity calcification of the aortic arch, without evidence of aortic aneurysm or dissection. The segmental and subsegmental pulmonary arteries are limited in evaluation secondary to suboptimal opacification with intravenous contrast and areas of overlying artifact. There is mild cardiomegaly with marked severity coronary artery calcification. No pericardial effusion. Mediastinum/Nodes: A 4.9 cm x 4.4 cm right paratracheal soft tissue mass is seen. This measures approximately 5.6 cm x 4.8 cm on the prior study predominant stable right hilar lymphadenopathy is noted. Multiple low-attenuation thyroid nodules are seen within an enlarged, heterogeneous right lobe of the  thyroid gland. The trachea and esophagus demonstrate no significant findings. 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

## 2021-07-26 NOTE — ED Triage Notes (Signed)
Presented via triage with c/o sob and elevated temp (100.38F) x3 days  ? ?Hx lung CA  ?Last chemo on 07/16/21 ?

## 2021-07-27 ENCOUNTER — Encounter (HOSPITAL_COMMUNITY): Payer: Self-pay | Admitting: Internal Medicine

## 2021-07-27 DIAGNOSIS — E785 Hyperlipidemia, unspecified: Secondary | ICD-10-CM

## 2021-07-27 DIAGNOSIS — Z7951 Long term (current) use of inhaled steroids: Secondary | ICD-10-CM | POA: Diagnosis not present

## 2021-07-27 DIAGNOSIS — D61810 Antineoplastic chemotherapy induced pancytopenia: Secondary | ICD-10-CM | POA: Diagnosis present

## 2021-07-27 DIAGNOSIS — R509 Fever, unspecified: Secondary | ICD-10-CM | POA: Diagnosis not present

## 2021-07-27 DIAGNOSIS — L409 Psoriasis, unspecified: Secondary | ICD-10-CM | POA: Diagnosis present

## 2021-07-27 DIAGNOSIS — I517 Cardiomegaly: Secondary | ICD-10-CM | POA: Diagnosis not present

## 2021-07-27 DIAGNOSIS — A419 Sepsis, unspecified organism: Secondary | ICD-10-CM | POA: Diagnosis present

## 2021-07-27 DIAGNOSIS — J189 Pneumonia, unspecified organism: Secondary | ICD-10-CM | POA: Diagnosis present

## 2021-07-27 DIAGNOSIS — D709 Neutropenia, unspecified: Secondary | ICD-10-CM

## 2021-07-27 DIAGNOSIS — D701 Agranulocytosis secondary to cancer chemotherapy: Secondary | ICD-10-CM | POA: Diagnosis present

## 2021-07-27 DIAGNOSIS — R5081 Fever presenting with conditions classified elsewhere: Secondary | ICD-10-CM

## 2021-07-27 DIAGNOSIS — I255 Ischemic cardiomyopathy: Secondary | ICD-10-CM | POA: Diagnosis present

## 2021-07-27 DIAGNOSIS — J441 Chronic obstructive pulmonary disease with (acute) exacerbation: Secondary | ICD-10-CM | POA: Diagnosis present

## 2021-07-27 DIAGNOSIS — R339 Retention of urine, unspecified: Secondary | ICD-10-CM | POA: Diagnosis present

## 2021-07-27 DIAGNOSIS — Z888 Allergy status to other drugs, medicaments and biological substances status: Secondary | ICD-10-CM | POA: Diagnosis not present

## 2021-07-27 DIAGNOSIS — F17210 Nicotine dependence, cigarettes, uncomplicated: Secondary | ICD-10-CM | POA: Diagnosis present

## 2021-07-27 DIAGNOSIS — Z87898 Personal history of other specified conditions: Secondary | ICD-10-CM

## 2021-07-27 DIAGNOSIS — Z801 Family history of malignant neoplasm of trachea, bronchus and lung: Secondary | ICD-10-CM | POA: Diagnosis not present

## 2021-07-27 DIAGNOSIS — Z79899 Other long term (current) drug therapy: Secondary | ICD-10-CM | POA: Diagnosis not present

## 2021-07-27 DIAGNOSIS — C3411 Malignant neoplasm of upper lobe, right bronchus or lung: Secondary | ICD-10-CM | POA: Diagnosis present

## 2021-07-27 DIAGNOSIS — C787 Secondary malignant neoplasm of liver and intrahepatic bile duct: Secondary | ICD-10-CM | POA: Diagnosis present

## 2021-07-27 DIAGNOSIS — T451X5A Adverse effect of antineoplastic and immunosuppressive drugs, initial encounter: Secondary | ICD-10-CM | POA: Diagnosis present

## 2021-07-27 DIAGNOSIS — I2584 Coronary atherosclerosis due to calcified coronary lesion: Secondary | ICD-10-CM | POA: Diagnosis present

## 2021-07-27 DIAGNOSIS — Z20822 Contact with and (suspected) exposure to covid-19: Secondary | ICD-10-CM | POA: Diagnosis present

## 2021-07-27 DIAGNOSIS — J431 Panlobular emphysema: Secondary | ICD-10-CM | POA: Diagnosis not present

## 2021-07-27 DIAGNOSIS — Z7982 Long term (current) use of aspirin: Secondary | ICD-10-CM | POA: Diagnosis not present

## 2021-07-27 DIAGNOSIS — J449 Chronic obstructive pulmonary disease, unspecified: Secondary | ICD-10-CM | POA: Diagnosis not present

## 2021-07-27 DIAGNOSIS — E7800 Pure hypercholesterolemia, unspecified: Secondary | ICD-10-CM | POA: Diagnosis present

## 2021-07-27 DIAGNOSIS — I10 Essential (primary) hypertension: Secondary | ICD-10-CM | POA: Diagnosis present

## 2021-07-27 DIAGNOSIS — J440 Chronic obstructive pulmonary disease with acute lower respiratory infection: Secondary | ICD-10-CM | POA: Diagnosis present

## 2021-07-27 DIAGNOSIS — I2510 Atherosclerotic heart disease of native coronary artery without angina pectoris: Secondary | ICD-10-CM | POA: Diagnosis present

## 2021-07-27 LAB — CBC WITH DIFFERENTIAL/PLATELET
Abs Immature Granulocytes: 0.01 10*3/uL (ref 0.00–0.07)
Basophils Absolute: 0 10*3/uL (ref 0.0–0.1)
Basophils Relative: 0 %
Eosinophils Absolute: 0.1 10*3/uL (ref 0.0–0.5)
Eosinophils Relative: 6 %
HCT: 26.2 % — ABNORMAL LOW (ref 39.0–52.0)
Hemoglobin: 8.6 g/dL — ABNORMAL LOW (ref 13.0–17.0)
Immature Granulocytes: 1 %
Lymphocytes Relative: 45 %
Lymphs Abs: 0.4 10*3/uL — ABNORMAL LOW (ref 0.7–4.0)
MCH: 32.5 pg (ref 26.0–34.0)
MCHC: 32.8 g/dL (ref 30.0–36.0)
MCV: 98.9 fL (ref 80.0–100.0)
Monocytes Absolute: 0.3 10*3/uL (ref 0.1–1.0)
Monocytes Relative: 39 %
Neutro Abs: 0.1 10*3/uL — CL (ref 1.7–7.7)
Neutrophils Relative %: 9 %
Platelet Morphology: NORMAL
Platelets: 50 10*3/uL — ABNORMAL LOW (ref 150–400)
RBC: 2.65 MIL/uL — ABNORMAL LOW (ref 4.22–5.81)
RDW: 16.1 % — ABNORMAL HIGH (ref 11.5–15.5)
WBC: 0.8 10*3/uL — CL (ref 4.0–10.5)
nRBC: 0 % (ref 0.0–0.2)

## 2021-07-27 LAB — URINALYSIS, ROUTINE W REFLEX MICROSCOPIC
Bilirubin Urine: NEGATIVE
Glucose, UA: NEGATIVE mg/dL
Hgb urine dipstick: NEGATIVE
Ketones, ur: NEGATIVE mg/dL
Leukocytes,Ua: NEGATIVE
Nitrite: NEGATIVE
Protein, ur: NEGATIVE mg/dL
Specific Gravity, Urine: 1.04 — ABNORMAL HIGH (ref 1.005–1.030)
pH: 5.5 (ref 5.0–8.0)

## 2021-07-27 LAB — URINE CULTURE
MICRO NUMBER:: 13320891
SPECIMEN QUALITY:: ADEQUATE

## 2021-07-27 LAB — COMPREHENSIVE METABOLIC PANEL
ALT: 38 U/L (ref 0–44)
AST: 15 U/L (ref 15–41)
Albumin: 2.5 g/dL — ABNORMAL LOW (ref 3.5–5.0)
Alkaline Phosphatase: 60 U/L (ref 38–126)
Anion gap: 4 — ABNORMAL LOW (ref 5–15)
BUN: 13 mg/dL (ref 8–23)
CO2: 28 mmol/L (ref 22–32)
Calcium: 8.2 mg/dL — ABNORMAL LOW (ref 8.9–10.3)
Chloride: 106 mmol/L (ref 98–111)
Creatinine, Ser: 0.88 mg/dL (ref 0.61–1.24)
GFR, Estimated: >60 mL/min (ref >60)
Glucose, Bld: 101 mg/dL — ABNORMAL HIGH (ref 70–99)
Potassium: 4 mmol/L (ref 3.5–5.1)
Sodium: 138 mmol/L (ref 135–145)
Total Bilirubin: 1 mg/dL (ref 0.3–1.2)
Total Protein: 5.7 g/dL — ABNORMAL LOW (ref 6.5–8.1)

## 2021-07-27 LAB — MRSA NEXT GEN BY PCR, NASAL: MRSA by PCR Next Gen: DETECTED — AB

## 2021-07-27 LAB — LACTIC ACID, PLASMA: Lactic Acid, Venous: 1.6 mmol/L (ref 0.5–1.9)

## 2021-07-27 MED ORDER — PROSIGHT PO TABS
1.0000 | ORAL_TABLET | Freq: Two times a day (BID) | ORAL | Status: DC
  Administered 2021-07-27 – 2021-07-31 (×9): 1 via ORAL
  Filled 2021-07-27 (×9): qty 1

## 2021-07-27 MED ORDER — SODIUM CHLORIDE 0.9 % IV BOLUS
1000.0000 mL | Freq: Once | INTRAVENOUS | Status: AC
  Administered 2021-07-27: 1000 mL via INTRAVENOUS

## 2021-07-27 MED ORDER — VITAMIN B-12 1000 MCG PO TABS
500.0000 ug | ORAL_TABLET | Freq: Every evening | ORAL | Status: DC
  Administered 2021-07-27 – 2021-07-30 (×4): 500 ug via ORAL
  Filled 2021-07-27 (×4): qty 1

## 2021-07-27 MED ORDER — ACETAMINOPHEN 325 MG PO TABS: 650.0000 mg | ORAL_TABLET | Freq: Four times a day (QID) | ORAL | Status: DC | PRN

## 2021-07-27 MED ORDER — ALBUTEROL SULFATE (2.5 MG/3ML) 0.083% IN NEBU
2.5000 mg | INHALATION_SOLUTION | Freq: Four times a day (QID) | RESPIRATORY_TRACT | Status: DC | PRN
  Administered 2021-07-29: 2.5 mg via RESPIRATORY_TRACT
  Filled 2021-07-27: qty 3

## 2021-07-27 MED ORDER — FLUTICASONE FUROATE-VILANTEROL 100-25 MCG/ACT IN AEPB
1.0000 | INHALATION_SPRAY | Freq: Every day | RESPIRATORY_TRACT | Status: DC
  Administered 2021-07-27 – 2021-07-28 (×2): 1 via RESPIRATORY_TRACT
  Filled 2021-07-27: qty 28

## 2021-07-27 MED ORDER — ACETAMINOPHEN 650 MG RE SUPP: 650.0000 mg | Freq: Four times a day (QID) | RECTAL | Status: DC | PRN

## 2021-07-27 MED ORDER — SODIUM CHLORIDE 0.9 % IV BOLUS
500.0000 mL | Freq: Once | INTRAVENOUS | Status: AC
  Administered 2021-07-27: 500 mL via INTRAVENOUS

## 2021-07-27 MED ORDER — NICOTINE 14 MG/24HR TD PT24
14.0000 mg | MEDICATED_PATCH | Freq: Every day | TRANSDERMAL | Status: DC
  Administered 2021-07-27 – 2021-07-31 (×5): 14 mg via TRANSDERMAL
  Filled 2021-07-27 (×5): qty 1

## 2021-07-27 MED ORDER — VANCOMYCIN HCL IN DEXTROSE 1-5 GM/200ML-% IV SOLN: 1000.0000 mg | INTRAVENOUS | Status: DC

## 2021-07-27 MED ORDER — VANCOMYCIN HCL 1750 MG/350ML IV SOLN
1750.0000 mg | INTRAVENOUS | Status: DC
  Administered 2021-07-27 – 2021-07-29 (×3): 1750 mg via INTRAVENOUS
  Filled 2021-07-27 (×5): qty 350

## 2021-07-27 MED ORDER — MUPIROCIN 2 % EX OINT
1.0000 "application " | TOPICAL_OINTMENT | Freq: Two times a day (BID) | CUTANEOUS | Status: DC
  Administered 2021-07-27 – 2021-07-31 (×8): 1 via NASAL
  Filled 2021-07-27: qty 22

## 2021-07-27 MED ORDER — VANCOMYCIN HCL IN DEXTROSE 1-5 GM/200ML-% IV SOLN
1000.0000 mg | INTRAVENOUS | Status: AC
  Administered 2021-07-27 (×2): 1000 mg via INTRAVENOUS
  Filled 2021-07-27 (×2): qty 200

## 2021-07-27 MED ORDER — UMECLIDINIUM BROMIDE 62.5 MCG/ACT IN AEPB
1.0000 | INHALATION_SPRAY | Freq: Every day | RESPIRATORY_TRACT | Status: DC
Start: 2021-07-27 — End: 2021-07-28
  Administered 2021-07-27 – 2021-07-28 (×2): 1 via RESPIRATORY_TRACT
  Filled 2021-07-27: qty 7

## 2021-07-27 MED ORDER — ROSUVASTATIN CALCIUM 20 MG PO TABS
20.0000 mg | ORAL_TABLET | Freq: Every day | ORAL | Status: DC
  Administered 2021-07-27 – 2021-07-31 (×5): 20 mg via ORAL
  Filled 2021-07-27 (×5): qty 1

## 2021-07-27 MED ORDER — SODIUM CHLORIDE 0.9 % IV SOLN
2.0000 g | Freq: Three times a day (TID) | INTRAVENOUS | Status: DC
  Administered 2021-07-27 – 2021-07-30 (×10): 2 g via INTRAVENOUS
  Filled 2021-07-27 (×10): qty 12.5

## 2021-07-27 MED ORDER — GUAIFENESIN ER 600 MG PO TB12
600.0000 mg | ORAL_TABLET | Freq: Two times a day (BID) | ORAL | Status: DC
  Administered 2021-07-27 – 2021-07-31 (×9): 600 mg via ORAL
  Filled 2021-07-27 (×9): qty 1

## 2021-07-27 MED ORDER — CHLORHEXIDINE GLUCONATE CLOTH 2 % EX PADS
6.0000 | MEDICATED_PAD | Freq: Every day | CUTANEOUS | Status: DC
  Administered 2021-07-28: 6 via TOPICAL

## 2021-07-27 MED ORDER — CHLORHEXIDINE GLUCONATE CLOTH 2 % EX PADS
6.0000 | MEDICATED_PAD | Freq: Every day | CUTANEOUS | Status: DC
  Administered 2021-07-27 – 2021-07-30 (×4): 6 via TOPICAL

## 2021-07-27 MED ORDER — TBO-FILGRASTIM 480 MCG/0.8ML ~~LOC~~ SOSY
480.0000 ug | PREFILLED_SYRINGE | Freq: Once | SUBCUTANEOUS | Status: AC
  Administered 2021-07-27: 480 ug via SUBCUTANEOUS
  Filled 2021-07-27: qty 0.8

## 2021-07-27 MED ORDER — ASPIRIN EC 81 MG PO TBEC
81.0000 mg | DELAYED_RELEASE_TABLET | Freq: Every day | ORAL | Status: DC
  Administered 2021-07-27 – 2021-07-31 (×5): 81 mg via ORAL
  Filled 2021-07-27 (×5): qty 1

## 2021-07-27 NOTE — Progress Notes (Signed)
PHARMACY NOTE:  ANTIMICROBIAL RENAL DOSAGE ADJUSTMENT ? ?Current antimicrobial regimen includes a mismatch between antimicrobial dosage and estimated renal function.  As per policy approved by the Pharmacy & Therapeutics and Medical Executive Committees, the antimicrobial dosage will be adjusted accordingly. ? ?Current antimicrobial dosage:  cefepime 2g q12 ? ?Indication: FN ? ?Renal Function: ? ?Estimated Creatinine Clearance: 63.4 mL/min (by C-G formula based on SCr of 1.05 mg/dL). ?[]      On intermittent HD, scheduled: ?[]      On CRRT ?   ?Antimicrobial dosage has been changed to:  cefepime 2g q8 ? ?Additional comments: ? ? ?Thank you for allowing pharmacy to be a part of this patient's care. ? ?Kara Mead, [facility] ?07/27/2021 12:16 PM ?

## 2021-07-27 NOTE — ED Notes (Signed)
Patient is resting comfortably; remains on 3L O2 via [place] with continuous cardiac and pulse ox maintained.  NS continues to infuse @150ml /hr to L chest port; dressing dry and intact.  External male urinary cath remains patent connected to wall suction.  Will continue to monitor for acute changes and maintain plan of care.  ?

## 2021-07-27 NOTE — H&P (Signed)
?History and Physical  ? ? ?Patient: Gerald [name] HBZ:[id number] DOB: 1940-10-16 ?DOA: 07/26/2021 ?DOS: the patient was seen and examined on 07/27/2021 ?PCP: [name], DO  ?Patient coming from: Home ? ?Chief Complaint:  ?Chief Complaint  ?Patient presents with  ? Shortness of Breath  ? ?HPI: Gerald [name] is a 81 y.o. male with medical history significant of COPD, small cell lung cancer, HLD, HTN, urinary retension, CAD. Presenting with weakness, shortness of breath, and fever. He had a new chemo regimen about 11 days ago. 4 days ago, he noticed that he was more short of breath and felt weaker. He was having more cough. He took his regular inhalers, but they didn't help. His wife noticed that he had a poor appetite and wasn't getting around as much. Yesterday, she noticed that the patient had a fever of 100.6. She became concerned and brought him to the ED. They deny any other aggravating or alleviating factors.   ? ?Review of Systems: As mentioned in the history of present illness. All other systems reviewed and are negative. ?Past Medical History:  ?Diagnosis Date  ? Bradycardia   ? Bursitis   ? Carotid artery disease ([place])   ? COPD (chronic obstructive pulmonary disease) ([place])   ? Coronary artery disease   ? CTO mid-dist CX (fills from left-to-left collaterals), 99% OM2, 30% ost-dist LM, 30% prox-mid LAD, 30% ost-mid CX, 99% prox RCA (too small for PCI), medical management 02/09/20  ? Hypercholesterolemia   ? Ischemic cardiomyopathy   ? Lung cancer ([place])   ? Lung nodule   ? nscl ca dx'd 08/2019  ? Psoriasis   ? Tobacco abuse   ? Wears glasses   ? reading  ? ?Past Surgical History:  ?Procedure Laterality Date  ? BRONCHIAL BIOPSY  09/27/2019  ? Procedure: BRONCHIAL BIOPSIES;  Surgeon: Garner Nash, DO;  Location: [facility];  Service: Pulmonary;;  ? BRONCHIAL BRUSHINGS  09/27/2019  ? Procedure: BRONCHIAL BRUSHINGS;  Surgeon: Garner Nash, DO;  Location: [facility];  Service: Pulmonary;;  ? BRONCHIAL  NEEDLE ASPIRATION BIOPSY  09/27/2019  ? Procedure: BRONCHIAL NEEDLE ASPIRATION BIOPSIES;  Surgeon: Garner Nash, DO;  Location: [facility];  Service: Pulmonary;;  ? BRONCHIAL NEEDLE ASPIRATION BIOPSY  03/29/2021  ? Procedure: BRONCHIAL NEEDLE ASPIRATION BIOPSIES;  Surgeon: Garner Nash, DO;  Location: [facility];  Service: Pulmonary;;  ? BRONCHIAL WASHINGS  09/27/2019  ? Procedure: BRONCHIAL WASHINGS;  Surgeon: Garner Nash, DO;  Location: [facility];  Service: Pulmonary;;  ? BRONCHIAL WASHINGS  03/29/2021  ? Procedure: BRONCHIAL WASHINGS;  Surgeon: Garner Nash, DO;  Location: [facility];  Service: Pulmonary;;  ? COLONOSCOPY    ? CRYOTHERAPY  09/27/2019  ? Procedure: CRYOTHERAPY;  Surgeon: Garner Nash, DO;  Location: [facility];  Service: Pulmonary;;  ? CRYOTHERAPY  03/29/2021  ? Procedure: CRYOTHERAPY;  Surgeon: Garner Nash, DO;  Location: [facility];  Service: Pulmonary;;  ? FIDUCIAL MARKER PLACEMENT  09/27/2019  ? Procedure: FIDUCIAL MARKER PLACEMENT;  Surgeon: Garner Nash, DO;  Location: [facility];  Service: Pulmonary;;  ? HEMOSTASIS CONTROL  09/27/2019  ? Procedure: HEMOSTASIS CONTROL;  Surgeon: Garner Nash, DO;  Location: [facility];  Service: Pulmonary;;  ? HERNIA REPAIR    ? umbicial- no longer as a "belly button"  ? IR IMAGING GUIDED PORT INSERTION  05/21/2021  ? IR US GUIDE VASC ACCESS LEFT  05/21/2021  ? RIGHT/LEFT HEART CATH AND CORONARY ANGIOGRAPHY N/A 02/09/2020  ? Procedure:  RIGHT/LEFT HEART CATH AND CORONARY ANGIOGRAPHY;  Surgeon: Burnell Blanks, MD;  Location: [facility];  Service: Cardiovascular;  Laterality: N/A;  ? VIDEO BRONCHOSCOPY WITH ENDOBRONCHIAL NAVIGATION N/A 09/27/2019  ? Procedure: VIDEO BRONCHOSCOPY WITH ENDOBRONCHIAL NAVIGATION;  Surgeon: Garner Nash, DO;  Location: [facility];  Service: Pulmonary;  Laterality: N/A;  ? VIDEO BRONCHOSCOPY WITH ENDOBRONCHIAL ULTRASOUND N/A 03/29/2021  ? Procedure: VIDEO  BRONCHOSCOPY WITH ENDOBRONCHIAL ULTRASOUND;  Surgeon: Garner Nash, DO;  Location: [facility];  Service: Pulmonary;  Laterality: N/A;  ? ?Social History:  reports that he has been smoking cigarettes. He has a 124.00 pack-year smoking history. He has been exposed to tobacco smoke. He has never used smokeless tobacco. He reports that he does not currently use alcohol. He reports that he does not use drugs. ? ?Allergies  ?Allergen Reactions  ? Cosela [Trilaciclib] Other (See Comments)  ?  thrombophlebitis  ? ? ?Family History  ?Problem Relation Age of Onset  ? Heart attack Father   ? Mesothelioma Sister 18  ? Lung cancer Brother   ? ? ?Prior to Admission medications   ?Medication Sig Start Date End Date Taking? Authorizing Provider  ?albuterol (VENTOLIN HFA) 108 (90 Base) MCG/ACT inhaler Inhale 2 puffs into the lungs every 6 (six) hours as needed for wheezing or shortness of breath. 04/30/21   Elodia Florence., MD  ?AMBULATORY NON FORMULARY MEDICATION Please provide 4 wheel rolling walker with seat for Gerald [name].  Diagnosis: R26.81 Gait instability 05/10/21   [name], DO  ?aspirin EC 81 MG tablet Take 1 tablet (81 mg total) by mouth daily. Swallow whole. 01/26/20   Buford Dresser, MD  ?clobetasol cream (TEMOVATE) 4.12 % Apply 1 application topically daily as needed (Psoriasis).    [provider]  ?Fluticasone-Umeclidin-Vilant (TRELEGY ELLIPTA) 100-62.5-25 MCG/ACT AEPB Inhale 1 puff into the lungs daily. 06/20/21 09/18/21  Garner Nash, DO  ?lidocaine-prilocaine (EMLA) cream Apply to the Port-A-Cath site 30-60-minute before chemotherapy. 04/16/21   Curt Bears, MD  ?Multiple Vitamins-Minerals (PRESERVISION AREDS 2 PO) Take 1 tablet by mouth in the morning and at bedtime.    [provider]  ?niacinamide 500 MG tablet Take 500 mg by mouth daily with breakfast.    [provider]  ?nicotine (NICODERM CQ - DOSED IN MG/24 HOURS) 14 mg/24hr patch Place 14 mg onto  the skin daily.    [provider]  ?predniSONE (DELTASONE) 50 MG tablet Take 1 tablet (50 mg total) by mouth daily. 07/08/21   Samuel Bouche, NP  ?prochlorperazine (COMPAZINE) 10 MG tablet Take 1 tablet (10 mg total) by mouth every 6 (six) hours as needed. 04/10/21   Heilingoetter, Cassandra L, PA-C  ?rosuvastatin (CRESTOR) 20 MG tablet Take 1 tablet (20 mg total) by mouth daily. 06/10/21 06/05/22  Buford Dresser, MD  ?tamsulosin (FLOMAX) 0.4 MG CAPS capsule Take 0.4 mg by mouth.    [provider]  ?triamcinolone cream (KENALOG) 0.1 % Apply 1 application topically daily as needed (skin irritation.). 02/01/21   [provider]  ?vitamin B-12 (CYANOCOBALAMIN) 500 MCG tablet Take 500 mcg by mouth every evening.    [provider]  ? ? ?Physical Exam: ?Vitals:  ? 07/27/21 0733 07/27/21 0800 07/27/21 0830 07/27/21 0900  ?BP: 105/63 (!) 100/53 (!) 100/57 (!) 104/52  ?Pulse: 71 81 77 80  ?Resp: 18 12 10 14   ?Temp:      ?TempSrc:      ?SpO2: 97% 95% 94% 95%  ?Weight:      ?  Height:      ? ?General: 81 y.o. male resting in bed in NAD ?Eyes: PERRL, normal sclera ?ENMT: Nares patent w/o discharge, orophaynx clear, dentition normal, ears w/o discharge/lesions/ulcers ?Neck: Supple, trachea midline ?Cardiovascular: RRR, +S1, S2, nog/r, 1/6 SEM, equal pulses throughout ?Respiratory: soft rhonchi right side, normal WOB ?GI: BS+, NDNT, no masses noted, no organomegaly noted ?MSK: No e/c/c ?Neuro: A&O x 3, no focal deficits ?Psyc: Appropriate interaction and affect, calm/cooperative ? ?Data Reviewed: ? ?Na+  134 ?SCr  1.05 ?AST  17 ?ALT  49 ?Lactic 2.1 -> 1.6 ?WBC  0.7 ?Hgb  9.8 ?Plt  57 ? ?CTA Chest: ?1. Marked severity right middle lobe and right lower lobe atelectasis and/or infiltrate, increased in severity when compared to the prior study. ?2. Moderate severity multifocal right upper lobe infiltrates which represents a new finding. ?3. Stable mass-like area along the anteromedial aspect of  the right upper lobe with involvement of the anterior chest wall and anterior mediastinum. ?4. Stable small right pleural effusion. ?5. 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

## 2021-07-27 NOTE — ED Notes (Addendum)
Pt became labored when attempting to get in bed // oxygen level 87-89% RA.  ? ?Pt placed on 3L [place]  ?

## 2021-07-28 DIAGNOSIS — T451X5A Adverse effect of antineoplastic and immunosuppressive drugs, initial encounter: Secondary | ICD-10-CM

## 2021-07-28 DIAGNOSIS — D709 Neutropenia, unspecified: Secondary | ICD-10-CM | POA: Diagnosis not present

## 2021-07-28 DIAGNOSIS — I2510 Atherosclerotic heart disease of native coronary artery without angina pectoris: Secondary | ICD-10-CM | POA: Diagnosis not present

## 2021-07-28 DIAGNOSIS — I255 Ischemic cardiomyopathy: Secondary | ICD-10-CM

## 2021-07-28 DIAGNOSIS — J449 Chronic obstructive pulmonary disease, unspecified: Secondary | ICD-10-CM | POA: Diagnosis not present

## 2021-07-28 DIAGNOSIS — E785 Hyperlipidemia, unspecified: Secondary | ICD-10-CM

## 2021-07-28 DIAGNOSIS — D701 Agranulocytosis secondary to cancer chemotherapy: Secondary | ICD-10-CM | POA: Diagnosis not present

## 2021-07-28 DIAGNOSIS — I2584 Coronary atherosclerosis due to calcified coronary lesion: Secondary | ICD-10-CM

## 2021-07-28 LAB — COMPREHENSIVE METABOLIC PANEL
ALT: 32 U/L (ref 0–44)
AST: 17 U/L (ref 15–41)
Albumin: 2.3 g/dL — ABNORMAL LOW (ref 3.5–5.0)
Alkaline Phosphatase: 62 U/L (ref 38–126)
Anion gap: 6 (ref 5–15)
BUN: 13 mg/dL (ref 8–23)
CO2: 24 mmol/L (ref 22–32)
Calcium: 8.2 mg/dL — ABNORMAL LOW (ref 8.9–10.3)
Chloride: 107 mmol/L (ref 98–111)
Creatinine, Ser: 0.96 mg/dL (ref 0.61–1.24)
GFR, Estimated: >60 mL/min (ref >60)
Glucose, Bld: 108 mg/dL — ABNORMAL HIGH (ref 70–99)
Potassium: 3.6 mmol/L (ref 3.5–5.1)
Sodium: 137 mmol/L (ref 135–145)
Total Bilirubin: 0.9 mg/dL (ref 0.3–1.2)
Total Protein: 5.4 g/dL — ABNORMAL LOW (ref 6.5–8.1)

## 2021-07-28 LAB — PREPARE RBC (CROSSMATCH)

## 2021-07-28 LAB — URINE CULTURE: Culture: <10000 — AB

## 2021-07-28 LAB — PROTIME-INR
INR: 1.3 — ABNORMAL HIGH (ref 0.8–1.2)
Prothrombin Time: 15.7 seconds — ABNORMAL HIGH (ref 11.4–15.2)

## 2021-07-28 LAB — CBC
HCT: 23.4 % — ABNORMAL LOW (ref 39.0–52.0)
Hemoglobin: 7.7 g/dL — ABNORMAL LOW (ref 13.0–17.0)
MCH: 32.8 pg (ref 26.0–34.0)
MCHC: 32.9 g/dL (ref 30.0–36.0)
MCV: 99.6 fL (ref 80.0–100.0)
Platelets: 60 10*3/uL — ABNORMAL LOW (ref 150–400)
RBC: 2.35 MIL/uL — ABNORMAL LOW (ref 4.22–5.81)
RDW: 15.9 % — ABNORMAL HIGH (ref 11.5–15.5)
WBC: 1 10*3/uL — CL (ref 4.0–10.5)
nRBC: 0 % (ref 0.0–0.2)

## 2021-07-28 LAB — CORTISOL-AM, BLOOD: Cortisol - AM: 13.6 ug/dL (ref 6.7–22.6)

## 2021-07-28 LAB — PROCALCITONIN: Procalcitonin: <0.1 ng/mL

## 2021-07-28 LAB — ABO/RH: ABO/RH(D): B NEG

## 2021-07-28 LAB — BRAIN NATRIURETIC PEPTIDE: B Natriuretic Peptide: 466.4 pg/mL — ABNORMAL HIGH (ref 0.0–100.0)

## 2021-07-28 MED ORDER — SODIUM CHLORIDE 0.9 % IV SOLN: INTRAVENOUS | Status: DC

## 2021-07-28 MED ORDER — ARFORMOTEROL TARTRATE 15 MCG/2ML IN NEBU
15.0000 ug | INHALATION_SOLUTION | Freq: Two times a day (BID) | RESPIRATORY_TRACT | Status: DC
  Administered 2021-07-28 – 2021-07-31 (×5): 15 ug via RESPIRATORY_TRACT
  Filled 2021-07-28 (×7): qty 2

## 2021-07-28 MED ORDER — TBO-FILGRASTIM 480 MCG/0.8ML ~~LOC~~ SOSY
480.0000 ug | PREFILLED_SYRINGE | Freq: Once | SUBCUTANEOUS | Status: AC
  Administered 2021-07-28: 480 ug via SUBCUTANEOUS
  Filled 2021-07-28: qty 0.8

## 2021-07-28 MED ORDER — ALBUMIN HUMAN 25 % IV SOLN
50.0000 g | Freq: Once | INTRAVENOUS | Status: AC
  Administered 2021-07-28: 50 g via INTRAVENOUS
  Filled 2021-07-28: qty 200

## 2021-07-28 MED ORDER — IPRATROPIUM-ALBUTEROL 0.5-2.5 (3) MG/3ML IN SOLN
3.0000 mL | Freq: Three times a day (TID) | RESPIRATORY_TRACT | Status: DC
  Administered 2021-07-29 – 2021-07-31 (×7): 3 mL via RESPIRATORY_TRACT
  Filled 2021-07-28 (×7): qty 3

## 2021-07-28 MED ORDER — POLYETHYLENE GLYCOL 3350 17 G PO PACK
17.0000 g | PACK | Freq: Every day | ORAL | Status: DC
  Administered 2021-07-28 – 2021-07-31 (×4): 17 g via ORAL
  Filled 2021-07-28 (×4): qty 1

## 2021-07-28 MED ORDER — BUDESONIDE 0.25 MG/2ML IN SUSP
0.2500 mg | Freq: Two times a day (BID) | RESPIRATORY_TRACT | Status: DC
  Administered 2021-07-28 – 2021-07-31 (×6): 0.25 mg via RESPIRATORY_TRACT
  Filled 2021-07-28 (×6): qty 2

## 2021-07-28 MED ORDER — SODIUM CHLORIDE 0.9% IV SOLUTION: Freq: Once | INTRAVENOUS | Status: AC

## 2021-07-28 MED ORDER — TRAZODONE HCL 50 MG PO TABS
25.0000 mg | ORAL_TABLET | Freq: Every evening | ORAL | Status: DC | PRN
  Administered 2021-07-28 – 2021-07-30 (×2): 25 mg via ORAL
  Filled 2021-07-28 (×2): qty 1

## 2021-07-28 MED ORDER — IPRATROPIUM-ALBUTEROL 0.5-2.5 (3) MG/3ML IN SOLN
3.0000 mL | Freq: Four times a day (QID) | RESPIRATORY_TRACT | Status: DC
  Administered 2021-07-28 (×2): 3 mL via RESPIRATORY_TRACT
  Filled 2021-07-28 (×2): qty 3

## 2021-07-28 NOTE — Progress Notes (Signed)
?PROGRESS NOTE ? ? ? ?Gerald [name]  LEX:[id number] DOB: 05-31-1940 DOA: 07/26/2021 ?PCP: [name], DO  ? ? 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 Started on IV antibiotics, IV fluids. ? ? ?Assessment & Plan: ?  ?Principal Problem: ?  Neutropenic fever ([place]) ?Active Problems: ?  Coronary artery disease due to calcified coronary lesion ?  Pure hypercholesterolemia ?  Small cell lung cancer ([place]) ?  Multifocal pneumonia ?  Sepsis ([place]) ?  Pancytopenia ([place]) ?  HTN (hypertension) ?  HLD (hyperlipidemia) ?  History of urinary retention ? ? ?Sepsis ?Neutropenic fever ?Multifocal pneumonia ?-patient admitted with fever, leukopenia, tachycardia , hypotension and elevated lactate ?-He received IV fluids and started on IV antibiotics ?-Urine culture with insignificant growth ?-Blood cultures in process, no growth as of yet ?-Sputum culture has been ordered ? ?Multifocal pneumonia ?-He does have productive cough ?-Unclear how much of this finding is his progressive malignancy ?-We will continue IV antibiotics ?-MRSA PCR positive ?-Sputum culture has been ordered ?-Continue pulmonary hygiene ? ?COPD ?-He does have mild wheezing ?-Continue inhaled steroids ?-Continue bronchodilators ? ?Pancytopenia ?-Likely related to recent chemotherapy ?-WBC 1.0 -> after discussing with oncology, he was started on Granix.  Continue for now ?-Platelets 60K -> no signs of bleeding, continue observation ?-Hemoglobin 7.7 -> since blood pressures are low, will transfuse 1 unit PRBC ? ?Ischemic cardiomyopathy ?-Last echo per cardiology records in 12/2019, from [facility] clinic with EF of 40% ?-We will update echocardiogram since he is receiving volume resuscitation would be at risk for developing decompensated CHF ? 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  Julien Nordmann ?-on chemo with lurbinectedin every 3 weeks, first dose 07/16/21 ? ?History of urinary retention ?-resume flomax once blood pressure will allow ? ?Hyperlipidemia ?-continued on statin ? ?Generalized weakness ?PT/OT ? ? ? ? ?DVT prophylaxis: SCDs Start: 07/27/21 1114 ? ?Code Status: Full code ?Family Communication: Patient's wife and son at the bedside ?Disposition Plan: Status is: Inpatient ?Remains inpatient appropriate because: Continued management of neutropenic fever/sepsis with IV antibiotics pending culture data ? ?Consultants:  ? ? ?Procedures:  ? ? ?Antimicrobials:  ?Vancomycin 4/29 > ?Cefepime 4/29 > ? ? ?Subjective: ?He does have a productive cough.  Overall he is feeling mildly better than he did yesterday.  He had a bowel movement earlier today. ? ?Objective: ?Vitals:  ? 07/28/21 1251 07/28/21 1300 07/28/21 1400 07/28/21 1500  ?BP:  (!) 83/51 (!) 99/44 (!) 86/36  ?Pulse:  79 86 63  ?Resp:  20 (!) 24 (!) 25  ?Temp: 98.9 ?F (37.2 ?C)     ?TempSrc: Oral     ?SpO2:  92% 97% (!) 72%  ?Weight:      ?Height:      ? ? ?Intake/Output Summary (Last 24 hours) at 07/28/2021 1630 ?Last data filed at 07/28/2021 1550 ?Gross per 24 hour  ?Intake 931.96 ml  ?Output 700 ml  ?Net 231.96 ml  ? ?Filed Weights  ? 07/26/21 2101 07/27/21 1005  ?Weight: 92.1 kg 93.8 kg  ? ? ?Examination: ? ?General exam: Appears calm and comfortable  ?Respiratory system: Bilateral rhonchi with mild wheeze bilaterally. Respiratory effort normal. ?Cardiovascular system: S1 & S2 heard, RRR. No JVD, murmurs, rubs, gallops or clicks. No pedal edema. ?Gastrointestinal system: Abdomen is distended, soft and nontender. No organomegaly or masses felt. Normal bowel sounds  heard. ?Central nervous system: Alert and oriented. No focal neurological deficits. ?Extremities: Symmetric 5 x 5 power. ?Skin: No rashes, lesions or ulcers ?Psychiatry: Judgement and insight appear normal. Mood & affect appropriate.  ? ? ? ?Data Reviewed: I have personally reviewed  following labs and imaging studies ? ?CBC: ?Recent Labs  ?Lab 07/23/21 ?1455 07/26/21 ?2114 07/27/21 ?1130 07/28/21 ?0640  ?WBC 3.7* 0.7* 0.8* 1.0*  ?NEUTROABS 2.9 0.2* 0.1*  --   ?HGB 9.1* 9.8* 8.6* 7.7*  ?HCT 27.9* 29.3* 26.2* 23.4*  ?MCV 97.9 97.0 98.9 99.6  ?PLT 52* 57* 50* 60*  ? ?Basic Metabolic Panel: ?Recent Labs  ?Lab 07/23/21 ?1455 07/26/21 ?2114 07/27/21 ?1130 07/28/21 ?0640  ?NA 137 134* 138 137  ?K 4.2 4.1 4.0 3.6  ?CL 104 100 106 107  ?CO2 28 23 28 24   ?GLUCOSE 144* 120* 101* 108*  ?BUN 21 16 13 13   ?CREATININE 0.95 1.05 0.88 0.96  ?CALCIUM 8.5* 9.1 8.2* 8.2*  ? ?GFR: ?Estimated Creatinine Clearance: 69.4 mL/min (by C-G formula based on SCr of 0.96 mg/dL). ?Liver Function Tests: ?Recent Labs  ?Lab 07/23/21 ?1455 07/26/21 ?2114 07/27/21 ?1130 07/28/21 ?0640  ?AST 30 17 15 17   ?ALT 114* 49* 38 32  ?ALKPHOS 86 79 60 62  ?BILITOT 0.4 0.9 1.0 0.9  ?PROT 6.1* 6.9 5.7* 5.4*  ?ALBUMIN 3.2* 3.8 2.5* 2.3*  ? ?No results for input(s): LIPASE, AMYLASE in the last 168 hours. ?No results for input(s): AMMONIA in the last 168 hours. ?Coagulation Profile: ?Recent Labs  ?Lab 07/26/21 ?2114 07/28/21 ?5681  ?INR 1.1 1.3*  ? ?Cardiac Enzymes: ?No results for input(s): CKTOTAL, CKMB, CKMBINDEX, TROPONINI in the last 168 hours. ?BNP (last 3 results) ?No results for input(s): PROBNP in the last 8760 hours. ?HbA1C: ?No results for input(s): HGBA1C in the last 72 hours. ?CBG: ?No results for input(s): GLUCAP in the last 168 hours. ?Lipid Profile: ?No results for input(s): CHOL, HDL, LDLCALC, TRIG, CHOLHDL, LDLDIRECT in the last 72 hours. ?Thyroid Function Tests: ?No results for input(s): TSH, T4TOTAL, FREET4, T3FREE, THYROIDAB in the last 72 hours. ?Anemia Panel: ?No results for input(s): VITAMINB12, FOLATE, FERRITIN, TIBC, IRON, RETICCTPCT in the last 72 hours. ?Sepsis Labs: ?Recent Labs  ?Lab 07/26/21 ?2114 07/27/21 ?0155 07/28/21 ?2751  ?PROCALCITON  --   --  <0.10  ?LATICACIDVEN 2.1* 1.6  --   ? ? ?Recent Results (from the  past 240 hour(s))  ?Urine Culture     Status: Abnormal  ? Collection Time: 07/24/21  1:47 PM  ? Specimen: Urine  ?Result Value Ref Range Status  ? MICRO NUMBER: [id number]  Final  ? SPECIMEN QUALITY: Adequate  Final  ? Sample Source NOT GIVEN  Final  ? STATUS: FINAL  Final  ? ISOLATE 1: Klebsiella pneumoniae (A)  Final  ?  Comment: Greater than 100,000 CFU/mL of Klebsiella pneumoniae  ?    Susceptibility  ? Klebsiella pneumoniae - URINE CULTURE, REFLEX  ?  AMOX/CLAVULANIC <=2 Sensitive   ?  AMPICILLIN 16 Resistant   ?  AMPICILLIN/SULBACTAM 4 Sensitive   ?  CEFAZOLIN* <=4 Not Reportable   ?   * For infections other than uncomplicated UTI ?caused by E. coli, K. pneumoniae or P. mirabilis: ?Cefazolin is resistant if MIC > or = 8 mcg/mL. ?(Distinguishing susceptible versus intermediate ?for isolates with MIC < or = 4 mcg/mL requires ?additional testing.) ?For uncomplicated UTI caused by E. coli, ?K. pneumoniae or P. mirabilis: Cefazolin is ?susceptible if MIC <32 mcg/mL and predicts ?susceptible  to the oral agents cefaclor, cefdinir, ?cefpodoxime, cefprozil, cefuroxime, cephalexin ?and loracarbef. ?  ?  CEFTAZIDIME <=1 Sensitive   ?  CEFEPIME <=1 Sensitive   ?  CEFTRIAXONE <=1 Sensitive   ?  CIPROFLOXACIN <=0.25 Sensitive   ?  LEVOFLOXACIN <=0.12 Sensitive   ?  GENTAMICIN <=1 Sensitive   ?  IMIPENEM <=0.25 Sensitive   ?  NITROFURANTOIN 128 Resistant   ?  PIP/TAZO <=4 Sensitive   ?  TOBRAMYCIN <=1 Sensitive   ?  TRIMETH/SULFA* <=20 Sensitive   ?   * For infections other than uncomplicated UTI ?caused by E. coli, K. pneumoniae or P. mirabilis: ?Cefazolin is resistant if MIC > or = 8 mcg/mL. ?(Distinguishing susceptible versus intermediate ?for isolates with MIC < or = 4 mcg/mL requires ?additional testing.) ?For uncomplicated UTI caused by E. coli, ?K. pneumoniae or P. mirabilis: Cefazolin is ?susceptible if MIC <32 mcg/mL and predicts ?susceptible to the oral agents cefaclor, cefdinir, ?cefpodoxime, cefprozil, cefuroxime,  cephalexin ?and loracarbef. ?Legend: ?S = Susceptible  I = Intermediate ?R = Resistant  NS = Not susceptible ?* = Not tested  NR = Not reported ?**NN = See antimicrobic comments ?  ?Culture, blood ([place]

## 2021-07-29 ENCOUNTER — Inpatient Hospital Stay (HOSPITAL_COMMUNITY): Payer: Medicare Other

## 2021-07-29 DIAGNOSIS — J449 Chronic obstructive pulmonary disease, unspecified: Secondary | ICD-10-CM | POA: Diagnosis not present

## 2021-07-29 DIAGNOSIS — R5081 Fever presenting with conditions classified elsewhere: Secondary | ICD-10-CM | POA: Diagnosis not present

## 2021-07-29 DIAGNOSIS — I517 Cardiomegaly: Secondary | ICD-10-CM

## 2021-07-29 DIAGNOSIS — D709 Neutropenia, unspecified: Secondary | ICD-10-CM | POA: Diagnosis not present

## 2021-07-29 DIAGNOSIS — D701 Agranulocytosis secondary to cancer chemotherapy: Secondary | ICD-10-CM | POA: Diagnosis not present

## 2021-07-29 DIAGNOSIS — Z87898 Personal history of other specified conditions: Secondary | ICD-10-CM | POA: Diagnosis not present

## 2021-07-29 DIAGNOSIS — J189 Pneumonia, unspecified organism: Secondary | ICD-10-CM

## 2021-07-29 LAB — TYPE AND SCREEN
ABO/RH(D): B NEG
Antibody Screen: NEGATIVE
Unit division: 0

## 2021-07-29 LAB — CBC WITH DIFFERENTIAL/PLATELET
Abs Immature Granulocytes: 0.07 10*3/uL (ref 0.00–0.07)
Basophils Absolute: 0 10*3/uL (ref 0.0–0.1)
Basophils Relative: 1 %
Eosinophils Absolute: 0 10*3/uL (ref 0.0–0.5)
Eosinophils Relative: 2 %
HCT: 25.3 % — ABNORMAL LOW (ref 39.0–52.0)
Hemoglobin: 8.3 g/dL — ABNORMAL LOW (ref 13.0–17.0)
Immature Granulocytes: 4 %
Lymphocytes Relative: 26 %
Lymphs Abs: 0.5 10*3/uL — ABNORMAL LOW (ref 0.7–4.0)
MCH: 31.8 pg (ref 26.0–34.0)
MCHC: 32.8 g/dL (ref 30.0–36.0)
MCV: 96.9 fL (ref 80.0–100.0)
Monocytes Absolute: 0.8 10*3/uL (ref 0.1–1.0)
Monocytes Relative: 38 %
Neutro Abs: 0.6 10*3/uL — ABNORMAL LOW (ref 1.7–7.7)
Neutrophils Relative %: 29 %
Platelets: 79 10*3/uL — ABNORMAL LOW (ref 150–400)
RBC: 2.61 MIL/uL — ABNORMAL LOW (ref 4.22–5.81)
RDW: 18 % — ABNORMAL HIGH (ref 11.5–15.5)
WBC: 2 10*3/uL — ABNORMAL LOW (ref 4.0–10.5)
nRBC: 0 % (ref 0.0–0.2)

## 2021-07-29 LAB — COMPREHENSIVE METABOLIC PANEL
ALT: 28 U/L (ref 0–44)
AST: 17 U/L (ref 15–41)
Albumin: 2.8 g/dL — ABNORMAL LOW (ref 3.5–5.0)
Alkaline Phosphatase: 56 U/L (ref 38–126)
Anion gap: 4 — ABNORMAL LOW (ref 5–15)
BUN: 10 mg/dL (ref 8–23)
CO2: 24 mmol/L (ref 22–32)
Calcium: 8.1 mg/dL — ABNORMAL LOW (ref 8.9–10.3)
Chloride: 109 mmol/L (ref 98–111)
Creatinine, Ser: 0.9 mg/dL (ref 0.61–1.24)
GFR, Estimated: >60 mL/min (ref >60)
Glucose, Bld: 120 mg/dL — ABNORMAL HIGH (ref 70–99)
Potassium: 3.5 mmol/L (ref 3.5–5.1)
Sodium: 137 mmol/L (ref 135–145)
Total Bilirubin: 0.9 mg/dL (ref 0.3–1.2)
Total Protein: 5.5 g/dL — ABNORMAL LOW (ref 6.5–8.1)

## 2021-07-29 LAB — BPAM RBC
Blood Product Expiration Date: 202306032359
ISSUE DATE / TIME: 202304301816
Unit Type and Rh: 1700

## 2021-07-29 LAB — ECHOCARDIOGRAM COMPLETE
AR max vel: 1.68 cm2
AV Area VTI: 1.56 cm2
AV Area mean vel: 1.7 cm2
AV Mean grad: 8.7 mmHg
AV Peak grad: 16.1 mmHg
Ao pk vel: 2.01 m/s
Area-P 1/2: 3.31 cm2
Calc EF: 58 %
Height: 72 in
MV VTI: 1.97 cm2
S' Lateral: 3.4 cm
Single Plane A2C EF: 56.4 %
Single Plane A4C EF: 58.9 %
Weight: 3308.66 oz

## 2021-07-29 LAB — PATHOLOGIST SMEAR REVIEW

## 2021-07-29 MED ORDER — POTASSIUM CHLORIDE CRYS ER 20 MEQ PO TBCR
40.0000 meq | EXTENDED_RELEASE_TABLET | Freq: Once | ORAL | Status: AC
Start: 2021-07-29 — End: 2021-07-29
  Administered 2021-07-29: 40 meq via ORAL
  Filled 2021-07-29: qty 2

## 2021-07-29 MED ORDER — TBO-FILGRASTIM 480 MCG/0.8ML ~~LOC~~ SOSY
480.0000 ug | PREFILLED_SYRINGE | Freq: Every day | SUBCUTANEOUS | Status: DC
  Administered 2021-07-29: 480 ug via SUBCUTANEOUS
  Filled 2021-07-29 (×2): qty 0.8

## 2021-07-29 MED ORDER — PERFLUTREN LIPID MICROSPHERE
1.0000 mL | INTRAVENOUS | Status: AC | PRN
  Administered 2021-07-29: 2 mL via INTRAVENOUS
  Filled 2021-07-29: qty 10

## 2021-07-29 MED ORDER — TAMSULOSIN HCL 0.4 MG PO CAPS
0.4000 mg | ORAL_CAPSULE | Freq: Every day | ORAL | Status: DC
  Administered 2021-07-29 – 2021-07-30 (×2): 0.4 mg via ORAL
  Filled 2021-07-29 (×2): qty 1

## 2021-07-29 NOTE — Progress Notes (Signed)
?PROGRESS NOTE ? ? ? ?Gerald [name]  ZOX:[id number] DOB: 04/22/1940 DOA: 07/26/2021 ?PCP: [name], DO  ? ? 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 Started on IV antibiotics, IV fluids. ? ? ?Assessment & Plan: ?  ?Principal Problem: ?  Neutropenic fever ([place]) ?Active Problems: ?  Ischemic cardiomyopathy ?  Coronary artery disease due to calcified coronary lesion ?  Pure hypercholesterolemia ?  Small cell lung cancer ([place]) ?  Multifocal pneumonia ?  Sepsis ([place]) ?  Pancytopenia ([place]) ?  HTN (hypertension) ?  HLD (hyperlipidemia) ?  History of urinary retention ?  COPD (chronic obstructive pulmonary disease) ([place]) ? ? ?Sepsis ?Neutropenic fever ?Multifocal pneumonia ?-patient admitted with fever, leukopenia, tachycardia , hypotension and elevated lactate ?-He received IV fluids and started on IV antibiotics ?-Urine culture with insignificant growth ?-Blood cultures in process, no growth as of yet ?-Sputum culture has been ordered ?-hemodynamics improving ? ?Multifocal pneumonia ?-He does have productive cough ?-Unclear how much of this finding is his progressive malignancy ?-We will continue IV antibiotics ?-MRSA PCR positive ?-Sputum culture has been ordered ?-Continue pulmonary hygiene ? ?COPD ?-Continue inhaled steroids ?-Continue bronchodilators ? ?Pancytopenia ?-Likely related to recent chemotherapy ?-oncology following ?-WBC 2.0, ANC 600 -> continue on Granix until ANC > 1500 ?-Platelets 79K -> no signs of bleeding, continue observation ?-Hemoglobin 8.3, s/p 1 unit prbc. Goal to keep Hgb >8 ? ?Ischemic cardiomyopathy ?-Last echo per cardiology records in 12/2019, from [facility] clinic with EF of 40% ?-We will update echocardiogram since he is receiving volume resuscitation would be at risk for developing decompensated CHF ? ?Metastatic small cell lung cancer with  mets to liver ?-Followed by oncology, Dr. Julien Nordmann ?-on chemo with lurbinectedin every 3 weeks, first dose 07/16/21 ? ?History of urinary retention ?-resume flomax ? ?Hyperlipidemia ?-continued on statin ? ?Generalized weakness ?PT/OT ? ? ? ? ?DVT prophylaxis: SCDs Start: 07/27/21 1114 ? ?Code Status: Full code ?Family Communication: Patient's daughter at the bedside ?Disposition Plan: Status is: Inpatient ?Remains inpatient appropriate because: Continued management of neutropenic fever/sepsis with IV antibiotics pending culture data ? ?Consultants:  ?oncology ? ?Procedures:  ? ? ?Antimicrobials:  ?Vancomycin 4/29 > ?Cefepime 4/29 > ? ? ?Subjective: ?Continues to have productive cough. Overall he is feeling better. Walked in the hall today. ? ?Objective: ?Vitals:  ? 07/29/21 0800 07/29/21 0852 07/29/21 0900 07/29/21 1130  ?BP: (!) 109/51  (!) 118/52   ?Pulse: 84  77   ?Resp: 16  (!) 22   ?Temp: 98.1 ?F (36.7 ?C)   97.7 ?F (36.5 ?C)  ?TempSrc: Oral   Oral  ?SpO2: 97% 95% 100%   ?Weight:      ?Height:      ? ? ?Intake/Output Summary (Last 24 hours) at 07/29/2021 1233 ?Last data filed at 07/29/2021 0800 ?Gross per 24 hour  ?Intake 2188.27 ml  ?Output 2050 ml  ?Net 138.27 ml  ? ?Filed Weights  ? 07/26/21 2101 07/27/21 1005  ?Weight: 92.1 kg 93.8 kg  ? ? ?Examination: ? ?General exam: Alert, awake, oriented x 3 ?Respiratory system: bilateral rhonchi. Mild increased resp effort. Currently on room air. ?Cardiovascular system:RRR. No murmurs, rubs, gallops. ?Gastrointestinal system: Abdomen is nondistended, soft and nontender. No organomegaly or masses felt. Normal bowel sounds heard. ?Central nervous system: Alert and oriented. No focal neurological deficits. ?Extremities: No C/C/E, +pedal pulses ?Skin: No rashes, lesions or ulcers ?  Psychiatry: Judgement and insight appear normal. Mood & affect appropriate.  ? ? ? ? ?Data Reviewed: I have personally reviewed following labs and imaging studies ? ?CBC: ?Recent Labs  ?Lab  07/23/21 ?1455 07/26/21 ?2114 07/27/21 ?1130 07/28/21 ?0640 07/29/21 ?0600  ?WBC 3.7* 0.7* 0.8* 1.0* 2.0*  ?NEUTROABS 2.9 0.2* 0.1*  --  0.6*  ?HGB 9.1* 9.8* 8.6* 7.7* 8.3*  ?HCT 27.9* 29.3* 26.2* 23.4* 25.3*  ?MCV 97.9 97.0 98.9 99.6 96.9  ?PLT 52* 57* 50* 60* 79*  ? ?Basic Metabolic Panel: ?Recent Labs  ?Lab 07/23/21 ?1455 07/26/21 ?2114 07/27/21 ?1130 07/28/21 ?0640 07/29/21 ?0600  ?NA 137 134* 138 137 137  ?K 4.2 4.1 4.0 3.6 3.5  ?CL 104 100 106 107 109  ?CO2 28 23 28 24 24   ?GLUCOSE 144* 120* 101* 108* 120*  ?BUN 21 16 13 13 10   ?CREATININE 0.95 1.05 0.88 0.96 0.90  ?CALCIUM 8.5* 9.1 8.2* 8.2* 8.1*  ? ?GFR: ?Estimated Creatinine Clearance: 74 mL/min (by C-G formula based on SCr of 0.9 mg/dL). ?Liver Function Tests: ?Recent Labs  ?Lab 07/23/21 ?1455 07/26/21 ?2114 07/27/21 ?1130 07/28/21 ?0640 07/29/21 ?0600  ?AST 30 17 15 17 17   ?ALT 114* 49* 38 32 28  ?ALKPHOS 86 79 60 62 56  ?BILITOT 0.4 0.9 1.0 0.9 0.9  ?PROT 6.1* 6.9 5.7* 5.4* 5.5*  ?ALBUMIN 3.2* 3.8 2.5* 2.3* 2.8*  ? ?No results for input(s): LIPASE, AMYLASE in the last 168 hours. ?No results for input(s): AMMONIA in the last 168 hours. ?Coagulation Profile: ?Recent Labs  ?Lab 07/26/21 ?2114 07/28/21 ?1607  ?INR 1.1 1.3*  ? ?Cardiac Enzymes: ?No results for input(s): CKTOTAL, CKMB, CKMBINDEX, TROPONINI in the last 168 hours. ?BNP (last 3 results) ?No results for input(s): PROBNP in the last 8760 hours. ?HbA1C: ?No results for input(s): HGBA1C in the last 72 hours. ?CBG: ?No results for input(s): GLUCAP in the last 168 hours. ?Lipid Profile: ?No results for input(s): CHOL, HDL, LDLCALC, TRIG, CHOLHDL, LDLDIRECT in the last 72 hours. ?Thyroid Function Tests: ?No results for input(s): TSH, T4TOTAL, FREET4, T3FREE, THYROIDAB in the last 72 hours. ?Anemia Panel: ?No results for input(s): VITAMINB12, FOLATE, FERRITIN, TIBC, IRON, RETICCTPCT in the last 72 hours. ?Sepsis Labs: ?Recent Labs  ?Lab 07/26/21 ?2114 07/27/21 ?0155 07/28/21 ?3710  ?PROCALCITON  --   --   <0.10  ?LATICACIDVEN 2.1* 1.6  --   ? ? ?Recent Results (from the past 240 hour(s))  ?Urine Culture     Status: Abnormal  ? Collection Time: 07/24/21  1:47 PM  ? Specimen: Urine  ?Result Value Ref Range Status  ? MICRO NUMBER: [id number]  Final  ? SPECIMEN QUALITY: Adequate  Final  ? Sample Source NOT GIVEN  Final  ? STATUS: FINAL  Final  ? ISOLATE 1: Klebsiella pneumoniae (A)  Final  ?  Comment: Greater than 100,000 CFU/mL of Klebsiella pneumoniae  ?    Susceptibility  ? Klebsiella pneumoniae - URINE CULTURE, REFLEX  ?  AMOX/CLAVULANIC <=2 Sensitive   ?  AMPICILLIN 16 Resistant   ?  AMPICILLIN/SULBACTAM 4 Sensitive   ?  CEFAZOLIN* <=4 Not Reportable   ?   * For infections other than uncomplicated UTI ?caused by E. coli, K. pneumoniae or P. mirabilis: ?Cefazolin is resistant if MIC > or = 8 mcg/mL. ?(Distinguishing susceptible versus intermediate ?for isolates with MIC < or = 4 mcg/mL requires ?additional testing.) ?For uncomplicated UTI caused by E. coli, ?K. pneumoniae or P. mirabilis: Cefazolin is ?susceptible if MIC <32  mcg/mL and predicts ?susceptible to the oral agents cefaclor, cefdinir, ?cefpodoxime, cefprozil, cefuroxime, cephalexin ?and loracarbef. ?  ?  CEFTAZIDIME <=1 Sensitive   ?  CEFEPIME <=1 Sensitive   ?  CEFTRIAXONE <=1 Sensitive   ?  CIPROFLOXACIN <=0.25 Sensitive   ?  LEVOFLOXACIN <=0.12 Sensitive   ?  GENTAMICIN <=1 Sensitive   ?  IMIPENEM <=0.25 Sensitive   ?  NITROFURANTOIN 128 Resistant   ?  PIP/TAZO <=4 Sensitive   ?  TOBRAMYCIN <=1 Sensitive   ?  TRIMETH/SULFA* <=20 Sensitive   ?   * For infections other than uncomplicated UTI ?caused by E. coli, K. pneumoniae or P. mirabilis: ?Cefazolin is resistant if MIC > or = 8 mcg/mL. ?(Distinguishing susceptible versus intermediate ?for isolates with MIC < or = 4 mcg/mL requires ?additional testing.) ?For uncomplicated UTI caused by E. coli, ?K. pneumoniae or P. mirabilis: Cefazolin is ?susceptible if MIC <32 mcg/mL and predicts ?susceptible to the  oral agents cefaclor, cefdinir, ?cefpodoxime, cefprozil, cefuroxime, cephalexin ?and loracarbef. ?Legend: ?S = Susceptible  I = Intermediate ?R = Resistant  NS = Not susceptible ?* = Not tested  NR = Not reporte

## 2021-07-29 NOTE — Progress Notes (Signed)
SATURATION QUALIFICATIONS: (This note is used to comply with regulatory documentation for home oxygen) ? ?Patient Saturations on Room Air at Rest = 90% ? ?Patient Saturations on Room Air while Ambulating = 85% ? ?Patient Saturations on 2 Liters of oxygen while Ambulating = 90% ? ?Please briefly explain why patient needs home oxygen:Pt requring O2 while talking and with activity.  ?Michaelia Beilfuss M,PT ?Acute Rehab Services ?[phone] ?[phone] (pager)  ?

## 2021-07-29 NOTE — Progress Notes (Addendum)
HEMATOLOGY-ONCOLOGY PROGRESS NOTE ? 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 ?Presented with new masslike area in the medial aspect of the right upper lobe with apparent invasion of the mediastinum and bulky right paratracheal lymphadenopathy in addition to liver lesions. ?The patient underwent systemic chemotherapy with carboplatin for AUC of 5 on day 1, etoposide 100 Mg/M2 on days 1, 2 and 3 with Cosela 240 Mg/M2 before chemotherapy as well as Imfinzi 1500 Mg IV on day 1 every 3 weeks.  Status post 4 cycles.  He tolerated this treatment well with no concerning adverse effect except for fatigue. ?Unfortunately his scan showed evidence for disease progression in the lung and liver. ?The patient is currently undergoing second line treatment with Zepzelca (lurbinectedin) 3.2 Mg/M2 every 3 weeks status post 2 cycles. 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 Sondra Come. ? ?The patient has developed febrile neutropenia.  Pancytopenia secondary to chemotherapy.  He is no longer having fevers.  Blood cultures are negative to date.  Urine culture with insignificant growth.  He remains on IV antibiotics per primary team.  Continue to follow-up on blood cultures.  Continue treatment of pneumonia per primary team. ? ?CBC from this morning has been reviewed.  His WBC is 2.0 and ANC is 0.6.  Recommend continuation of Granix 480 mcg subcu daily until ANC is 1.5 or higher.  His hemoglobin has improved to 8.3.  Recommend PRBC transfusion to keep hemoglobin above 8.  Platelet count this morning is 79,000.  Recommend platelet transfusion if platelet count is less than 20,000 or active bleeding.  Check CBC with differential daily. ? ?Mikey Bussing, DNP,  AGPCNP-BC, AOCNP ? ?SUBJECTIVE: Mr. Casebolt is followed by our office for extensive stage (T2a, N2, M1b) small cell lung cancer that was initially diagnosed as synchronous stage Ia non-small cell lung cancer, squamous cell involving the right upper lobe in addition to right lower lobe endobronchial lesion diagnosed in June 2021.  He had evidence for disease recurrence and metastasis in December 2022.  He is currently receiving systemic treatment with Zepzelca.  He is status post 2 cycles of this treatment last given on 07/16/2021.  He is now admitted with neutropenic fever and multifocal pneumonia.  He was started on Granix. ? ?This morning, he reports that he feels slightly better than when he came in.  He still has some shortness of breath.  He reports swelling in his upper extremities.  No other new complaints this morning. ? ?Oncology History  ?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[place])  ?04/10/2021 Initial Diagnosis  ? 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[place]) ? ?  ?04/16/2021 - 06/20/2021 Chemotherapy  ? Patient is on Treatment Plan : LUNG SMALL CELL EXTENSIVE STAGE Durvalumab + Carboplatin D1 + Etoposide D1-3 q21d x 4 Cycles / Durvalumab q28d  ? ?  ?  ?07/16/2021 -  Chemotherapy  ? Patient is on Treatment Plan : LUNG SMALL CELL Lurbinectedin q21d  ? ?  ?  ? ? ? ?REVIEW OF SYSTEMS:   ?Review of Systems  ?Constitutional:  Negative for chills and fever.  ?HENT: Negative.    ?Eyes: Negative.   ?Respiratory:  Positive for cough and shortness of breath. Negative for  hemoptysis.   ?Cardiovascular: Negative.   ?Gastrointestinal: Negative.   ?Genitourinary: Negative.   ?Skin: Negative.   ?Neurological: Negative.   ? ?I have reviewed the past medical history, past surgical history, social history and family history with the patient and they are unchanged from previous note. ? ? ?PHYSICAL EXAMINATION: ?ECOG PERFORMANCE STATUS: 3 - Symptomatic, >50% confined to bed ? ?Vitals:  ? 07/29/21 0600 07/29/21  0630  ?BP: (!) 102/48   ?Pulse: 83 78  ?Resp: (!) 23 19  ?Temp:    ?SpO2: 91% 97%  ? ?Filed Weights  ? 07/26/21 2101 07/27/21 1005  ?Weight: 92.1 kg 93.8 kg  ? ? ?Intake/Output from previous day: ?04/30 0701 - 05/01 0700 ?In: 2047.9 [I.V.:914.8; Blood:315; IV Piggyback:818.1] ?Out: 6606 [Urine:1650] ? ?Physical Exam ?Vitals reviewed.  ?Constitutional:   ?   General: He is not in acute distress. ?HENT:  ?   Head: Normocephalic.  ?Eyes:  ?   General: No scleral icterus. ?   Conjunctiva/sclera: Conjunctivae normal.  ?Cardiovascular:  ?   Rate and Rhythm: Normal rate.  ?Pulmonary:  ?   Effort: Pulmonary effort is normal. No respiratory distress.  ?   Breath sounds: Rhonchi present.  ?Musculoskeletal:  ?   Comments: Trace edema to the bilateral upper extremities.  No lower extremity edema.  ?Neurological:  ?   Mental Status: He is alert and oriented to person, place, and time.  ? ? ?LABORATORY DATA:  ?I have reviewed the data as listed ? ?  Latest Ref Rng & Units 07/29/2021  ?  6:00 AM 07/28/2021  ?  6:40 AM 07/27/2021  ? 11:30 AM  ?CMP  ?Glucose 70 - 99 mg/dL 120   108   101    ?BUN 8 - 23 mg/dL 10   13   13     ?Creatinine 0.61 - 1.24 mg/dL 0.90   0.96   0.88    ?Sodium 135 - 145 mmol/L 137   137   138    ?Potassium 3.5 - 5.1 mmol/L 3.5   3.6   4.0    ?Chloride 98 - 111 mmol/L 109   107   106    ?CO2 22 - 32 mmol/L 24   24   28     ?Calcium 8.9 - 10.3 mg/dL 8.1   8.2   8.2    ?Total Protein 6.5 - 8.1 g/dL 5.5   5.4   5.7    ?Total Bilirubin 0.3 - 1.2 mg/dL 0.9   0.9   1.0    ?Alkaline Phos 38 - 126 U/L 56   62   60    ?AST 15 - 41 U/L 17   17   15     ?ALT 0 - 44 U/L 28   32   38    ? ? ?Lab Results  ?Component Value Date  ? WBC 2.0 (L) 07/29/2021  ? HGB 8.3 (L) 07/29/2021  ? HCT 25.3 (L) 07/29/2021  ? MCV 96.9 07/29/2021  ? PLT 79 (L) 07/29/2021  ? NEUTROABS 0.6 (L) 07/29/2021  ? ? ?No results found for: CEA1, CEA, [id number], CA125, PSA1 ? ?CT Chest W Contrast ? ?Result Date: 07/05/2021 ?CLINICAL DATA:  Small cell lung cancer  restaging * Tracking Code: BO * EXAM: CT CHEST WITH CONTRAST TECHNIQUE: Multidetector CT imaging of the chest was performed during intravenous contrast administration. RADIATION DOSE REDUCTION: This exam was performed according to the departmental dose-optimization program which includes automated exposure control, adjustment of the mA  and/or kV according to patient size and/or use of iterative reconstruction technique. CONTRAST:  167mL OMNIPAQUE IOHEXOL 300 MG/ML  SOLN COMPARISON:  Multiple exams, including 04/24/2021 FINDINGS: Cardiovascular: Left Port-A-Cath tip: Cavoatrial junction. Coronary, aortic arch, and branch vessel atherosclerotic vascular disease. Upper normal heart size. Mediastinum/Nodes: Hypodense right thyroid nodule 2.6 cm in long axis on image 14 series 504. Enlarged right paratracheal nodal mass 5.6 by 4.8 cm on image 25 series 504, previously 4.1 by 3.9 cm. Conglomerate right hilar adenopathy currently 1.9 cm in short axis on image 32 series 500 for, previously 1.5 cm. Right subpectoral lymph node 0.8 cm in short axis on image 21 series 500 for, previously 0.6 cm. Lymph node in the right pericardial adipose tissue 1.0 cm in short axis on image 34 series 504, previously 0.9 cm. Clustered lymph nodes along the right internal mammary chain including a 0.7 cm node on image 27 series 504 and a 0.8 cm node on image 32 series 504. Lungs/Pleura: Anterior right upper lobe mass medially involving the anterior chest wall and anterior mediastinum measures 3.8 by 3.4 cm on image 27 series 504, previously 3.8 by 2.6 cm by my measurements. Suspected tumor extending along the pleura in this vicinity. Small right pleural effusion with enhancing margins, presumably malignant. Centrilobular emphysema. Airway thickening with volume loss in the right lower lobe and especially in the right middle lobe. Interstitial accentuation in the right upper lobe is improved compared to prior. Upper Abdomen: Please see today's  CT abdomen report. Musculoskeletal: Unremarkable IMPRESSION: 1. Mild enlargement of the dominant anterior right upper lobe lesion with mediastinal and chest wall invasion. Worsened right paratracheal mass/aden

## 2021-07-29 NOTE — Evaluation (Addendum)
Physical Therapy Evaluation ?Patient Details ?Name: Gerald [name] ?MRN: [id number] ?DOB: 12/10/1940 ?Today's Date: 07/29/2021 ? ?History of Present Illness ? Tramain Gershman is a 81 y.o. male admitted 4/28 presenting with weakness, shortness of breath, and fever. He had a new chemo regimen about 11 days ago. 4 days ago, he noticed that he was more short of breath and felt weaker.  PMH: COPD, small cell lung cancer, HLD, HTN, urinary retension, CAD.  ?Clinical Impression ? Pt admitted with above diagnosis. Pt was able to ambulate with RW needing some cues and assist as pt somewhat impulsive and slightly unsteady.  Pt will not have 24 hour hands on care and will need to ambulate on his own at home. Encouraged pt to use the RW at home and he agreed.  Pt did desaturate with activity and with talking and needed 2LO2 to recover.  Nurse made aware as on arrival pt was on RA.  Pt currently with functional limitations due to balance and endurance deficits. Pt will benefit from skilled PT to increase their independence and safety with mobility to allow discharge to the venue listed below.      ?   ? ?Recommendations for follow up therapy are one component of a multi-disciplinary discharge planning process, led by the attending physician.  Recommendations may be updated based on patient status, additional functional criteria and insurance authorization. ? ?Follow Up Recommendations Home health PT ? ?  ?Assistance Recommended at Discharge Intermittent Supervision/Assistance  ?Patient can return home with the following ? A little help with walking and/or transfers;A little help with bathing/dressing/bathroom ? ?  ?Equipment Recommendations None recommended by PT  ?Recommendations for Other Services ?    ?  ?Functional Status Assessment Patient has had a recent decline in their functional status and demonstrates the ability to make significant improvements in function in a reasonable and predictable amount of time.  ? ?  ?Precautions /  Restrictions Precautions ?Precautions: Fall ?Restrictions ?Weight Bearing Restrictions: No  ? ?  ? ?Mobility ? Bed Mobility ?Overal bed mobility: Independent ?  ?  ?  ?  ?  ?  ?  ?  ? ?Transfers ?Overall transfer level: Needs assistance ?Equipment used: Rolling walker (2 wheels) ?Transfers: Sit to/from Stand ?Sit to Stand: Min assist ?  ?  ?  ?  ?  ?General transfer comment: Pt did not need assist to power up from raised bed however upon standing needed steadying assist. Pt stated that he got foot caught under bed however pt is impulsive and feel that this may have caused him to stagger. ?  ? ?Ambulation/Gait ?Ambulation/Gait assistance: Min assist ?Gait Distance (Feet): 150 Feet ?Assistive device: Rolling walker (2 wheels) ?Gait Pattern/deviations: Step-through pattern, Decreased stride length, Staggering left, Staggering right ?  ?Gait velocity interpretation: <1.8 ft/sec, indicate of risk for recurrent falls ?  ?General Gait Details: Pt was able to ambulate with RW however needed cues for safety as he is impulsive at times and needed cues to stay close to RW. Pt at times walked left of the RW.  Pt seemed generally unsteady. Intiially pt on RA and sats 90%.   Requiring O2 today to keep sats above 88% with talking and with gait.  With 2LO2, pt sats >88% with talking and with gait.  Without the O2 pt sats drop to 85%.  Educated pt with incentive spirometer. ? ?Stairs ?  ?  ?  ?  ?  ? ?Wheelchair Mobility ?  ? ?Modified Rankin (Stroke Patients  Only) ?  ? ?  ? ?Balance Overall balance assessment: Needs assistance ?Sitting-balance support: No upper extremity supported, Feet supported ?Sitting balance-Leahy Scale: Fair ?  ?  ?Standing balance support: Bilateral upper extremity supported, During functional activity, Reliant on assistive device for balance ?Standing balance-Leahy Scale: Poor ?Standing balance comment: relies on UE support on RW for balance and needed external support as well. ?  ?  ?  ?  ?  ?  ?  ?  ?  ?   ?  ?   ? ? ? ?Pertinent Vitals/Pain Pain Assessment ?Pain Assessment: No/denies pain  ? ? ?Home Living Family/patient expects to be discharged to:: Private residence ?Living Arrangements: Spouse/significant other ?Available Help at Discharge: Family;Available 24 hours/day (wife cant help physically, daughter close by) ?Type of Home: Other(Comment) (Townhouse) ?Home Access: Level entry ?  ?  ?  ?Home Layout: One level ?Home Equipment: Air cabin crew (4 wheels);Cane - single point;Hand held shower head ?   ?  ?Prior Function Prior Level of Function : Independent/Modified Independent ?  ?  ?  ?  ?  ?  ?Mobility Comments: independent; still drive; wife handles money and meds ?ADLs Comments: usually independent ?  ? ? ?Hand Dominance  ? Dominant Hand: Right ? ?  ?Extremity/Trunk Assessment  ? Upper Extremity Assessment ?Upper Extremity Assessment: Defer to OT evaluation ?  ? ?Lower Extremity Assessment ?Lower Extremity Assessment: Generalized weakness ?  ? ?Cervical / Trunk Assessment ?Cervical / Trunk Assessment: Normal  ?Communication  ? Communication: No difficulties  ?Cognition Arousal/Alertness: Awake/alert ?Behavior During Therapy: [facility] for tasks assessed/performed ?Overall Cognitive Status: Within Functional Limits for tasks assessed ?  ?  ?  ?  ?  ?  ?  ?  ?  ?  ?  ?  ?  ?  ?  ?  ?General Comments: Pt does demonstrate poor safety awareness at times.  Needs cues for incr safety with impulsivity noted. ?  ?  ? ?  ?General Comments General comments (skin integrity, edema, etc.): 81 bpm, 93% RA, 22, 113/56. See above for O2 drop with ambulation.   126/46 post walk, 94% on RA after walk until pt talking and sats drop to 87% placing O2 on pt at 2L.  Nurse aware. ? ?  ?Exercises    ? ?Assessment/Plan  ?  ?PT Assessment Patient needs continued PT services  ?PT Problem List Decreased activity tolerance;Decreased balance;Decreased mobility;Decreased knowledge of use of DME;Decreased safety awareness;Decreased  knowledge of precautions;Cardiopulmonary status limiting activity ? ?   ?  ?PT Treatment Interventions DME instruction;Gait training;Functional mobility training;Therapeutic activities;Therapeutic exercise;Balance training;Patient/family education   ? ?PT Goals (Current goals can be found in the Care Plan section)  ?Acute Rehab PT Goals ?Patient Stated Goal: to go home ?PT Goal Formulation: With patient ?Time For Goal Achievement: 08/12/21 ?Potential to Achieve Goals: Good ? ?  ?Frequency Min 3X/week ?  ? ? ?Co-evaluation   ?  ?  ?  ?  ? ? ?  ?AM-PAC PT "6 Clicks" Mobility  ?Outcome Measure Help needed turning from your back to your side while in a flat bed without using bedrails?: None ?Help needed moving from lying on your back to sitting on the side of a flat bed without using bedrails?: None ?Help needed moving to and from a bed to a chair (including a wheelchair)?: A Little ?Help needed standing up from a chair using your arms (e.g., wheelchair or bedside chair)?: A Little ?Help needed to walk in  hospital room?: A Little ?Help needed climbing 3-5 steps with a railing? : A Lot ?6 Click Score: 19 ? ?  ?End of Session Equipment Utilized During Treatment: Gait belt;Oxygen ?Activity Tolerance: Patient limited by fatigue ?Patient left: with call bell/phone within reach;in bed;with family/visitor present (bed in chair position) ?Nurse Communication: Mobility status ?PT Visit Diagnosis: Unsteadiness on feet (R26.81);Muscle weakness (generalized) (M62.81) ?  ? ?Time: 4503-8882 ?PT Time Calculation (min) (ACUTE ONLY): 55 min ? ? ?Charges:   PT Evaluation ?$PT Eval Moderate Complexity: 1 Mod ?PT Treatments ?$Gait Training: 23-37 mins ?$Self Care/Home Management: 8-22 ?  ?   ? ? ?Destaney Sarkis M,PT ?Acute Rehab Services ?[phone] ?[phone] (pager)  ? ?Alvira Philips ?07/29/2021, 2:39 PM ? ?

## 2021-07-30 ENCOUNTER — Inpatient Hospital Stay: Payer: Medicare Other

## 2021-07-30 ENCOUNTER — Inpatient Hospital Stay (HOSPITAL_COMMUNITY): Payer: Medicare Other

## 2021-07-30 ENCOUNTER — Other Ambulatory Visit: Payer: Medicare Other

## 2021-07-30 DIAGNOSIS — J449 Chronic obstructive pulmonary disease, unspecified: Secondary | ICD-10-CM | POA: Diagnosis not present

## 2021-07-30 DIAGNOSIS — D709 Neutropenia, unspecified: Secondary | ICD-10-CM | POA: Diagnosis not present

## 2021-07-30 DIAGNOSIS — D701 Agranulocytosis secondary to cancer chemotherapy: Secondary | ICD-10-CM | POA: Diagnosis not present

## 2021-07-30 DIAGNOSIS — Z87898 Personal history of other specified conditions: Secondary | ICD-10-CM | POA: Diagnosis not present

## 2021-07-30 LAB — CBC WITH DIFFERENTIAL/PLATELET
Abs Immature Granulocytes: 0.16 10*3/uL — ABNORMAL HIGH (ref 0.00–0.07)
Basophils Absolute: 0 10*3/uL (ref 0.0–0.1)
Basophils Relative: 0 %
Eosinophils Absolute: 0.1 10*3/uL (ref 0.0–0.5)
Eosinophils Relative: 2 %
HCT: 27.4 % — ABNORMAL LOW (ref 39.0–52.0)
Hemoglobin: 8.7 g/dL — ABNORMAL LOW (ref 13.0–17.0)
Immature Granulocytes: 2 %
Lymphocytes Relative: 8 %
Lymphs Abs: 0.6 10*3/uL — ABNORMAL LOW (ref 0.7–4.0)
MCH: 31.4 pg (ref 26.0–34.0)
MCHC: 31.8 g/dL (ref 30.0–36.0)
MCV: 98.9 fL (ref 80.0–100.0)
Monocytes Absolute: 1.7 10*3/uL — ABNORMAL HIGH (ref 0.1–1.0)
Monocytes Relative: 23 %
Neutro Abs: 4.8 10*3/uL (ref 1.7–7.7)
Neutrophils Relative %: 65 %
Platelets: 116 10*3/uL — ABNORMAL LOW (ref 150–400)
RBC: 2.77 MIL/uL — ABNORMAL LOW (ref 4.22–5.81)
RDW: 17.9 % — ABNORMAL HIGH (ref 11.5–15.5)
WBC: 7.4 10*3/uL (ref 4.0–10.5)
nRBC: 0.4 % — ABNORMAL HIGH (ref 0.0–0.2)

## 2021-07-30 LAB — MAGNESIUM: Magnesium: 1.7 mg/dL (ref 1.7–2.4)

## 2021-07-30 LAB — BRAIN NATRIURETIC PEPTIDE: B Natriuretic Peptide: 443.5 pg/mL — ABNORMAL HIGH (ref 0.0–100.0)

## 2021-07-30 LAB — BASIC METABOLIC PANEL
Anion gap: 6 (ref 5–15)
BUN: 9 mg/dL (ref 8–23)
CO2: 25 mmol/L (ref 22–32)
Calcium: 8.3 mg/dL — ABNORMAL LOW (ref 8.9–10.3)
Chloride: 107 mmol/L (ref 98–111)
Creatinine, Ser: 0.83 mg/dL (ref 0.61–1.24)
GFR, Estimated: >60 mL/min (ref >60)
Glucose, Bld: 125 mg/dL — ABNORMAL HIGH (ref 70–99)
Potassium: 4 mmol/L (ref 3.5–5.1)
Sodium: 138 mmol/L (ref 135–145)

## 2021-07-30 MED ORDER — CEFDINIR 300 MG PO CAPS
300.0000 mg | ORAL_CAPSULE | Freq: Two times a day (BID) | ORAL | Status: DC
  Administered 2021-07-30 – 2021-07-31 (×2): 300 mg via ORAL
  Filled 2021-07-30 (×3): qty 1

## 2021-07-30 NOTE — Evaluation (Signed)
Occupational Therapy Evaluation ?Patient Details ?Name: Gerald [name] ?MRN: [id number] ?DOB: 1940/09/05 ?Today's Date: 07/30/2021 ? ? ?History of Present Illness Gerald [name] is a 81 y.o. male admitted 4/28 presenting with weakness, shortness of breath, and fever. He had a new chemo regimen about 11 days ago. 4 days ago, he noticed that he was more short of breath and felt weaker.  PMH: COPD, small cell lung cancer, HLD, HTN, urinary retension, CAD.  ? ?Clinical Impression ?  ?Patient lives at home with spouse and is independent at baseline with self care and mobility. Currently patient limited by cardiopulmonary status with desat to 85% with activity on 3L O2. Provide verbal cues for pursed lip breathing and with standing rest break recovers to 95-96%. Patient also with lateral loss of balance when standing from recliner chair needing min A for safety. Increased work of breathing and fatigue noted with donning clean pair of socks seated in recliner. Recommend continued acute OT services to maximize patient endurance, strength, balance, safety awareness in order facilitate D/C to venue listed below.   ? ?Recommendations for follow up therapy are one component of a multi-disciplinary discharge planning process, led by the attending physician.  Recommendations may be updated based on patient status, additional functional criteria and insurance authorization.  ? ?Follow Up Recommendations ? Home health OT  ?  ?Assistance Recommended at Discharge Frequent or constant Supervision/Assistance  ?Patient can return home with the following A little help with walking and/or transfers;A little help with bathing/dressing/bathroom;Assist for transportation;Help with stairs or ramp for entrance ? ?  ?Functional Status Assessment ? Patient has had a recent decline in their functional status and demonstrates the ability to make significant improvements in function in a reasonable and predictable amount of time.  ?Equipment  Recommendations ? None recommended by OT  ?  ?   ?Precautions / Restrictions Precautions ?Precautions: Fall ?Precaution Comments: monitor O2 ?Restrictions ?Weight Bearing Restrictions: No  ? ?  ? ?Mobility Bed Mobility ?  ?  ?  ?  ?  ?  ?  ?General bed mobility comments: In recliner chair ?  ? ? ? ?  ?Balance Overall balance assessment: Needs assistance ?Sitting-balance support: Feet supported ?Sitting balance-Leahy Scale: Fair ?  ?  ?Standing balance support: Bilateral upper extremity supported, During functional activity ?Standing balance-Leahy Scale: Poor ?Standing balance comment: Min A needed to regain balance after lateral loss of balance ?  ?  ?  ?  ?  ?  ?  ?  ?  ?  ?  ?   ? ?ADL either performed or assessed with clinical judgement  ? ?ADL Overall ADL's : Needs assistance/impaired ?Eating/Feeding: Independent ?  ?Grooming: Set up;Sitting ?  ?Upper Body Bathing: Set up;Sitting ?  ?Lower Body Bathing: Minimal assistance;Sitting/lateral leans;Sit to/from stand;Set up ?  ?Upper Body Dressing : Set up;Sitting ?  ?Lower Body Dressing: Set up;Minimal assistance;Sitting/lateral leans;Sit to/from stand ?Lower Body Dressing Details (indicate cue type and reason): In seated position patient is able to doff/don socks with increased time and work of breathing. Min A in standing due to loss of balance ?Toilet Transfer: Minimal assistance;Rolling walker (2 wheels) ?Toilet Transfer Details (indicate cue type and reason): Patient with initial lateral loss of balance standing up from recliner chair attempting to march in place with walker needing min A to regain balance. Patient needing to imminently void, provide urinal and min A for steadying ?Toileting- Clothing Manipulation and Hygiene: Minimal assistance;Sitting/lateral lean;Sit to/from stand ?  ?  ?  ?  Functional mobility during ADLs: Minimal assistance;Rolling walker (2 wheels) ?General ADL Comments: Difficult to obtain accurate pleth wave in standing however O2 reading  85% on 3L with activity. Standing rest break and cues for pursed lip breathing patient recover to 96%. When patient blowing in nose in sitting without O2 readings 91-92%, cue patient to don O2 once done blowing his nose.  ? ? ? ? ?Pertinent Vitals/Pain Pain Assessment ?Pain Assessment: No/denies pain  ? ? ? ?Hand Dominance Right ?  ?Extremity/Trunk Assessment Upper Extremity Assessment ?Upper Extremity Assessment: Generalized weakness ?  ?Lower Extremity Assessment ?Lower Extremity Assessment: Defer to PT evaluation ?  ?Cervical / Trunk Assessment ?Cervical / Trunk Assessment: Normal ?  ?Communication Communication ?Communication: No difficulties ?  ?Cognition Arousal/Alertness: Awake/alert ?Behavior During Therapy: [facility] for tasks assessed/performed ?Overall Cognitive Status: Within Functional Limits for tasks assessed ?  ?  ?  ?  ?  ?  ?  ?  ?  ?  ?  ?  ?  ?  ?  ?  ?General Comments: Mild impulsivity noted ?  ?  ?   ?   ?   ? ? ?Home Living Family/patient expects to be discharged to:: Private residence ?Living Arrangements: Spouse/significant other ?Available Help at Discharge: Family;Available 24 hours/day (spouse can't help physically, daughter close by) ?Type of Home: Other(Comment) (townhouse) ?Home Access: Level entry ?  ?  ?Home Layout: One level ?  ?  ?Bathroom Shower/Tub: Walk-in shower ?  ?Bathroom Toilet: Standard ?  ?  ?Home Equipment: Air cabin crew (4 wheels);Cane - single point;Hand held shower head ?  ?  ?  ? ?  ?Prior Functioning/Environment Prior Level of Function : Independent/Modified Independent ?  ?  ?  ?  ?  ?  ?Mobility Comments: independent; still drive; wife handles money and meds ?ADLs Comments: usually independent ?  ? ?  ?  ?OT Problem List: Decreased strength;Decreased activity tolerance;Impaired balance (sitting and/or standing);Decreased safety awareness;Cardiopulmonary status limiting activity ?  ?   ?OT Treatment/Interventions: Self-care/ADL training;Balance  training;Patient/family education;Therapeutic activities  ?  ?OT Goals(Current goals can be found in the care plan section) Acute Rehab OT Goals ?Patient Stated Goal: Feel stronger ?OT Goal Formulation: With patient ?Time For Goal Achievement: 08/13/21 ?Potential to Achieve Goals: Good  ?OT Frequency: Min 2X/week ?  ? ?   ?AM-PAC OT "6 Clicks" Daily Activity     ?Outcome Measure Help from another person eating meals?: None ?Help from another person taking care of personal grooming?: A Little ?Help from another person toileting, which includes using toliet, bedpan, or urinal?: A Little ?Help from another person bathing (including washing, rinsing, drying)?: A Little ?Help from another person to put on and taking off regular upper body clothing?: A Little ?Help from another person to put on and taking off regular lower body clothing?: A Little ?6 Click Score: 19 ?  ?End of Session Equipment Utilized During Treatment: Oxygen;Rolling walker (2 wheels) ?Nurse Communication: Mobility status ? ?Activity Tolerance: Patient limited by fatigue ?Patient left: in chair;with call bell/phone within reach;with chair alarm set ? ?OT Visit Diagnosis: Unsteadiness on feet (R26.81);Other abnormalities of gait and mobility (R26.89)  ?              ?Time: 0086-7619 ?OT Time Calculation (min): 19 min ?Charges:  OT General Charges ?$OT Visit: 1 Visit ?OT Evaluation ?$OT Eval Low Complexity: 1 Low ? ?Delbert Phenix OT ?OT pager: [phone] ? ?Rosemary Holms ?07/30/2021, 2:23 PM ?

## 2021-07-30 NOTE — Progress Notes (Signed)
?PROGRESS NOTE ? ? ? ?Gerald [name]  ZOX:[id number] DOB: 1940-08-26 DOA: 07/26/2021 ?PCP: [name], DO  ? ? 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 Started on IV antibiotics, IV fluids. Clinically improving. Continues to have shortness of breath and wheezing. ? ? ?Assessment & Plan: ?  ?Principal Problem: ?  Neutropenic fever ([place]) ?Active Problems: ?  Ischemic cardiomyopathy ?  Coronary artery disease due to calcified coronary lesion ?  Pure hypercholesterolemia ?  Small cell lung cancer ([place]) ?  Multifocal pneumonia ?  Sepsis ([place]) ?  Pancytopenia ([place]) ?  HTN (hypertension) ?  HLD (hyperlipidemia) ?  History of urinary retention ?  COPD (chronic obstructive pulmonary disease) ([place]) ? ? ?Sepsis ?Neutropenic fever ?Multifocal pneumonia ?-patient admitted with fever, leukopenia, tachycardia , hypotension and elevated lactate ?-He received IV fluids and started on IV antibiotics ?-Urine culture with insignificant growth ?-Blood cultures in process, no growth as of yet ?-Sputum culture has been ordered ?-hemodynamics improving ? ?Multifocal pneumonia ?-He does have productive cough ?-Unclear how much of this finding is his progressive malignancy ?-On IV abx therapy with cefepime and vancomycin ?-MRSA PCR positive ?-Sputum culture has been ordered, but staff unable to collect sample ?-Continue pulmonary hygiene ?-clinically improving, afebrile, will transition to cefdinir  ? ?COPD ?-Continue inhaled steroids ?-Continue bronchodilators ?-has some wheezing and persistent shortness of breath, will check chest xray ?-may need systemic steroids ? ?Pancytopenia ?-Likely related to recent chemotherapy ?-oncology following ?-WBC 7.4,--> Granix discontinued ?-Platelets 116K -> no signs of bleeding, continue observation ?-Hemoglobin 8.7, s/p 1 unit prbc. Goal to keep Hgb  >8 ? ?Ischemic cardiomyopathy ?-Last echo per cardiology records in 12/2019, from [place] clinic with EF of 40% ?-Echo updated with normal LVEF ? 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 Julien Nordmann ?-on chemo with lurbinectedin every 3 weeks, first dose 07/16/21 ? ?History of urinary retention ?-resume flomax ? ?Hyperlipidemia ?-continued on statin ? ?Generalized weakness ?Will need home health PT ? ? ? ? ?DVT prophylaxis: SCDs Start: 07/27/21 1114 ? ?Code Status: Full code ?Family Communication: discussed with patient ?Disposition Plan: Status is: Inpatient ?Remains inpatient appropriate because: Continued management of neutropenic fever/sepsis with IV antibiotics pending culture data ? ?Consultants:  ?oncology ? ?Procedures:  ? ? ?Antimicrobials:  ?Vancomycin 4/29 > ?Cefepime 4/29 > ? ? ?Subjective: ?Continues to have productive cough. Still feels short of breath, not back to baseline ? ?Objective: ?Vitals:  ? 07/30/21 0122 07/30/21 0515 07/30/21 0912 07/30/21 1341  ?BP: 140/77 108/70  117/62  ?Pulse: 99 90  90  ?Resp: 20 17  18   ?Temp: (!) 97.5 ?F (36.4 ?C) (!) 97.3 ?F (36.3 ?C)  97.6 ?F (36.4 ?C)  ?TempSrc: Oral Oral  Oral  ?SpO2: 95% 97% 98% 96%  ?Weight:      ?Height:      ? ? ?Intake/Output Summary (Last 24 hours) at 07/30/2021 1408 ?Last data filed at 07/30/2021 1225 ?Gross per 24 hour  ?Intake 710 ml  ?Output 2150 ml  ?Net -1440 ml  ? ?Filed Weights  ? 07/26/21 2101 07/27/21 1005  ?Weight: 92.1 kg 93.8 kg  ? ? ?Examination: ? ?General exam: Alert, awake, oriented x 3 ?Respiratory system: bilateral wheezing, increased resp effort. Appears to be getting short of breath in conversation ?Cardiovascular system:RRR. No murmurs, rubs, gallops. ?Gastrointestinal system: Abdomen is nondistended, soft and nontender. No  organomegaly or masses felt. Normal bowel sounds heard. ?Central nervous system: Alert and oriented. No focal neurological deficits. ?Extremities: No C/C/E, +pedal  pulses ?Skin: No rashes, lesions or ulcers ?Psychiatry: Judgement and insight appear normal. Mood & affect appropriate.  ? ? ? ? ? ?Data Reviewed: I have personally reviewed following labs and imaging studies ? ?CBC: ?Recent Labs  ?Lab 07/23/21 ?1455 07/26/21 ?2114 07/27/21 ?1130 07/28/21 ?0640 07/29/21 ?0600 07/30/21 ?0500  ?WBC 3.7* 0.7* 0.8* 1.0* 2.0* 7.4  ?NEUTROABS 2.9 0.2* 0.1*  --  0.6* 4.8  ?HGB 9.1* 9.8* 8.6* 7.7* 8.3* 8.7*  ?HCT 27.9* 29.3* 26.2* 23.4* 25.3* 27.4*  ?MCV 97.9 97.0 98.9 99.6 96.9 98.9  ?PLT 52* 57* 50* 60* 79* 116*  ? ?Basic Metabolic Panel: ?Recent Labs  ?Lab 07/26/21 ?2114 07/27/21 ?1130 07/28/21 ?0640 07/29/21 ?0600 07/30/21 ?0500  ?NA 134* 138 137 137 138  ?K 4.1 4.0 3.6 3.5 4.0  ?CL 100 106 107 109 107  ?CO2 23 28 24 24 25   ?GLUCOSE 120* 101* 108* 120* 125*  ?BUN 16 13 13 10 9   ?CREATININE 1.05 0.88 0.96 0.90 0.83  ?CALCIUM 9.1 8.2* 8.2* 8.1* 8.3*  ?MG  --   --   --   --  1.7  ? ?GFR: ?Estimated Creatinine Clearance: 80.2 mL/min (by C-G formula based on SCr of 0.83 mg/dL). ?Liver Function Tests: ?Recent Labs  ?Lab 07/23/21 ?1455 07/26/21 ?2114 07/27/21 ?1130 07/28/21 ?0640 07/29/21 ?0600  ?AST 30 17 15 17 17   ?ALT 114* 49* 38 32 28  ?ALKPHOS 86 79 60 62 56  ?BILITOT 0.4 0.9 1.0 0.9 0.9  ?PROT 6.1* 6.9 5.7* 5.4* 5.5*  ?ALBUMIN 3.2* 3.8 2.5* 2.3* 2.8*  ? ?No results for input(s): LIPASE, AMYLASE in the last 168 hours. ?No results for input(s): AMMONIA in the last 168 hours. ?Coagulation Profile: ?Recent Labs  ?Lab 07/26/21 ?2114 07/28/21 ?0923  ?INR 1.1 1.3*  ? ?Cardiac Enzymes: ?No results for input(s): CKTOTAL, CKMB, CKMBINDEX, TROPONINI in the last 168 hours. ?BNP (last 3 results) ?No results for input(s): PROBNP in the last 8760 hours. ?HbA1C: ?No results for input(s): HGBA1C in the last 72 hours. ?CBG: ?No results for input(s): GLUCAP in the last 168 hours. ?Lipid Profile: ?No results for input(s): CHOL, HDL, LDLCALC, TRIG, CHOLHDL, LDLDIRECT in the last 72 hours. ?Thyroid Function  Tests: ?No results for input(s): TSH, T4TOTAL, FREET4, T3FREE, THYROIDAB in the last 72 hours. ?Anemia Panel: ?No results for input(s): VITAMINB12, FOLATE, FERRITIN, TIBC, IRON, RETICCTPCT in the last 72 hours. ?Sepsis Labs: ?Recent Labs  ?Lab 07/26/21 ?2114 07/27/21 ?0155 07/28/21 ?3007  ?PROCALCITON  --   --  <0.10  ?LATICACIDVEN 2.1* 1.6  --   ? ? ?Recent Results (from the past 240 hour(s))  ?Urine Culture     Status: Abnormal  ? Collection Time: 07/24/21  1:47 PM  ? Specimen: Urine  ?Result Value Ref Range Status  ? MICRO NUMBER: [id number]  Final  ? SPECIMEN QUALITY: Adequate  Final  ? Sample Source NOT GIVEN  Final  ? STATUS: FINAL  Final  ? ISOLATE 1: Klebsiella pneumoniae (A)  Final  ?  Comment: Greater than 100,000 CFU/mL of Klebsiella pneumoniae  ?    Susceptibility  ? Klebsiella pneumoniae - URINE CULTURE, REFLEX  ?  AMOX/CLAVULANIC <=2 Sensitive   ?  AMPICILLIN 16 Resistant   ?  AMPICILLIN/SULBACTAM 4 Sensitive   ?  CEFAZOLIN* <=4 Not Reportable   ?   * For infections other than uncomplicated UTI ?  caused by E. coli, K. pneumoniae or P. mirabilis: ?Cefazolin is resistant if MIC > or = 8 mcg/mL. ?(Distinguishing susceptible versus intermediate ?for isolates with MIC < or = 4 mcg/mL requires ?additional testing.) ?For uncomplicated UTI caused by E. coli, ?K. pneumoniae or P. mirabilis: Cefazolin is ?susceptible if MIC <32 mcg/mL and predicts ?susceptible to the oral agents cefaclor, cefdinir, ?cefpodoxime, cefprozil, cefuroxime, cephalexin ?and loracarbef. ?  ?  CEFTAZIDIME <=1 Sensitive   ?  CEFEPIME <=1 Sensitive   ?  CEFTRIAXONE <=1 Sensitive   ?  CIPROFLOXACIN <=0.25 Sensitive   ?  LEVOFLOXACIN <=0.12 Sensitive   ?  GENTAMICIN <=1 Sensitive   ?  IMIPENEM <=0.25 Sensitive   ?  NITROFURANTOIN 128 Resistant   ?  PIP/TAZO <=4 Sensitive   ?  TOBRAMYCIN <=1 Sensitive   ?  TRIMETH/SULFA* <=20 Sensitive   ?   * For infections other than uncomplicated UTI ?caused by E. coli, K. pneumoniae or P.  mirabilis: ?Cefazolin is resistant if MIC > or = 8 mcg/mL. ?(Distinguishing susceptible versus intermediate ?for isolates with MIC < or = 4 mcg/mL requires ?additional testing.) ?For uncomplicated UTI caused by E. coli, ?K. pneumon

## 2021-07-30 NOTE — Progress Notes (Signed)
Brief oncology note: ? ?CBC from this morning has been reviewed.  His white blood cell count is now normal at 7.4 and ANC is normal at 4.8.  Hemoglobin is 8.7 and platelet count is up to 116,000.  Pancytopenia secondary to chemotherapy and is slowly improving. ? ?CBC ?   ?Component Value Date/Time  ? WBC 7.4 07/30/2021 0500  ? RBC 2.77 (L) 07/30/2021 0500  ? HGB 8.7 (L) 07/30/2021 0500  ? HGB 9.1 (L) 07/23/2021 1455  ? HGB 16.9 02/07/2020 1208  ? HCT 27.4 (L) 07/30/2021 0500  ? HCT 49.1 02/07/2020 1208  ? PLT 116 (L) 07/30/2021 0500  ? PLT 52 (L) 07/23/2021 1455  ? PLT 185 02/07/2020 1208  ? MCV 98.9 07/30/2021 0500  ? MCV 94 02/07/2020 1208  ? MCH 31.4 07/30/2021 0500  ? MCHC 31.8 07/30/2021 0500  ? RDW 17.9 (H) 07/30/2021 0500  ? RDW 12.6 02/07/2020 1208  ? LYMPHSABS 0.6 (L) 07/30/2021 0500  ? MONOABS 1.7 (H) 07/30/2021 0500  ? EOSABS 0.1 07/30/2021 0500  ? BASOSABS 0.0 07/30/2021 0500  ? ? ?Recommendations: ?1.  Discontinue Granix. ?2.  Transfuse PRBCs for hemoglobin less than 8. ?3.  Transfuse platelets for platelet count less than 20,000 or active bleeding. ?4.  Management of pneumonia per primary team. ? ?He has outpatient follow-up already scheduled in our office on 08/06/2021.  Please call oncology if any additional questions or concerns arise. ? ?Future Appointments  ?Date Time Provider [place]  ?08/06/2021 10:30 AM [facility] FLUSH [facility] None  ?08/06/2021 11:00 AM Heilingoetter, Cassandra L, PA-C [facility] None  ?08/06/2021 11:45 AM [facility] INFUSION [facility] None  ?08/13/2021  1:00 PM [facility] [place] None  ?08/20/2021  1:30 PM [facility] [place] FLUSH [facility] None  ?08/28/2021 10:00 AM [facility] [place] FLUSH [facility] None  ?08/28/2021 10:30 AM Curt Bears, MD [facility] None  ?08/28/2021 11:30 AM [facility] INFUSION [facility] None  ?09/03/2021  1:00 PM [facility] [place] None  ?09/10/2021  1:00 PM [place] None  ?09/17/2021 10:30 AM [facility] [place]  FLUSH [facility] None  ?09/17/2021 11:00 AM Heilingoetter, Cassandra L, PA-C [facility] None  ?09/17/2021 12:00 PM [facility] INFUSION [facility] None  ?09/24/2021  1:00 PM [place] None  ?10/18/2021 11:10 AM [name], DO [facility] None  ? ? ?

## 2021-07-30 NOTE — Care Management Important Message (Signed)
Important Message ? ?Patient Details IM Letter placed in Patients room. ?Name: Gerald [name] ?MRN: [id number] ?Date of Birth: 1940-10-13 ? ? ?Medicare Important Message Given:  Yes ? ? ? ? ?Kerin Salen ?07/30/2021, 12:20 PM ?

## 2021-07-31 DIAGNOSIS — R509 Fever, unspecified: Secondary | ICD-10-CM | POA: Diagnosis not present

## 2021-07-31 DIAGNOSIS — J431 Panlobular emphysema: Secondary | ICD-10-CM | POA: Diagnosis not present

## 2021-07-31 DIAGNOSIS — D709 Neutropenia, unspecified: Secondary | ICD-10-CM | POA: Diagnosis not present

## 2021-07-31 DIAGNOSIS — D701 Agranulocytosis secondary to cancer chemotherapy: Secondary | ICD-10-CM | POA: Diagnosis not present

## 2021-07-31 LAB — CBC WITH DIFFERENTIAL/PLATELET
Abs Immature Granulocytes: 1.36 10*3/uL — ABNORMAL HIGH (ref 0.00–0.07)
Basophils Absolute: 0.1 10*3/uL (ref 0.0–0.1)
Basophils Relative: 1 %
Eosinophils Absolute: 0.1 10*3/uL (ref 0.0–0.5)
Eosinophils Relative: 1 %
HCT: 27.8 % — ABNORMAL LOW (ref 39.0–52.0)
Hemoglobin: 8.9 g/dL — ABNORMAL LOW (ref 13.0–17.0)
Immature Granulocytes: 9 %
Lymphocytes Relative: 5 %
Lymphs Abs: 0.7 10*3/uL (ref 0.7–4.0)
MCH: 31.4 pg (ref 26.0–34.0)
MCHC: 32 g/dL (ref 30.0–36.0)
MCV: 98.2 fL (ref 80.0–100.0)
Monocytes Absolute: 1.9 10*3/uL — ABNORMAL HIGH (ref 0.1–1.0)
Monocytes Relative: 13 %
Neutro Abs: 10.7 10*3/uL — ABNORMAL HIGH (ref 1.7–7.7)
Neutrophils Relative %: 71 %
Platelets: 120 10*3/uL — ABNORMAL LOW (ref 150–400)
RBC: 2.83 MIL/uL — ABNORMAL LOW (ref 4.22–5.81)
RDW: 17.8 % — ABNORMAL HIGH (ref 11.5–15.5)
WBC: 14.8 10*3/uL — ABNORMAL HIGH (ref 4.0–10.5)
nRBC: 0.7 % — ABNORMAL HIGH (ref 0.0–0.2)

## 2021-07-31 LAB — BASIC METABOLIC PANEL
Anion gap: 4 — ABNORMAL LOW (ref 5–15)
BUN: 7 mg/dL — ABNORMAL LOW (ref 8–23)
CO2: 28 mmol/L (ref 22–32)
Calcium: 8.5 mg/dL — ABNORMAL LOW (ref 8.9–10.3)
Chloride: 108 mmol/L (ref 98–111)
Creatinine, Ser: 0.81 mg/dL (ref 0.61–1.24)
GFR, Estimated: >60 mL/min (ref >60)
Glucose, Bld: 111 mg/dL — ABNORMAL HIGH (ref 70–99)
Potassium: 3.8 mmol/L (ref 3.5–5.1)
Sodium: 140 mmol/L (ref 135–145)

## 2021-07-31 MED ORDER — ALBUTEROL SULFATE HFA 108 (90 BASE) MCG/ACT IN AERS: 2.0000 | INHALATION_SPRAY | Freq: Four times a day (QID) | RESPIRATORY_TRACT | 2 refills | Status: AC | PRN

## 2021-07-31 MED ORDER — METHYLPREDNISOLONE SODIUM SUCC 40 MG IJ SOLR
40.0000 mg | Freq: Two times a day (BID) | INTRAMUSCULAR | Status: DC
  Administered 2021-07-31: 40 mg via INTRAVENOUS
  Filled 2021-07-31: qty 1

## 2021-07-31 MED ORDER — CEFDINIR 300 MG PO CAPS: 300.0000 mg | ORAL_CAPSULE | Freq: Two times a day (BID) | ORAL | 0 refills | Status: AC

## 2021-07-31 MED ORDER — HYDROCORTISONE 1 % EX OINT: 1.0000 "application " | TOPICAL_OINTMENT | Freq: Two times a day (BID) | CUTANEOUS | 0 refills | Status: AC

## 2021-07-31 MED ORDER — HEPARIN SOD (PORK) LOCK FLUSH 100 UNIT/ML IV SOLN
500.0000 [IU] | Freq: Once | INTRAVENOUS | Status: DC
  Filled 2021-07-31: qty 5

## 2021-07-31 MED ORDER — HEPARIN SOD (PORK) LOCK FLUSH 100 UNIT/ML IV SOLN
INTRAVENOUS | Status: AC
  Administered 2021-07-31: 500 [IU]
  Filled 2021-07-31: qty 5

## 2021-07-31 MED ORDER — PREDNISONE 20 MG PO TABS
40.0000 mg | ORAL_TABLET | Freq: Every day | ORAL | 0 refills | Status: AC
Start: 2021-07-31 — End: 2021-08-05

## 2021-07-31 MED ORDER — GUAIFENESIN ER 600 MG PO TB12: 600.0000 mg | ORAL_TABLET | Freq: Two times a day (BID) | ORAL | 0 refills | Status: AC

## 2021-07-31 NOTE — Discharge Summary (Signed)
?Physician Discharge Summary ?  ?Patient: Gerald [name] MRN: [id number] DOB: 09-30-1940  ?Admit date:     07/26/2021  ?Discharge date: 07/31/21  ?Discharge Physician: Estill Cotta, MD  ? ?PCP: [name], DO  ? ?Recommendations at discharge:  ? ?Outpatient follow-up with PCP in 7 to 10 days ? ?Discharge Diagnoses: ? ?  Neutropenic fever ([place]) ?Acute COPD exacerbation ?   Coronary artery disease due to calcified coronary lesion ?  Pure hypercholesterolemia ?  Small cell lung cancer ([place]) ?  Multifocal pneumonia ?  Sepsis ([place]) ?  Pancytopenia ([place]) ?  HTN (hypertension) ?  HLD (hyperlipidemia) ?  History of urinary retention ? ? ? ? 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 Started on IV antibiotics, IV fluids. Clinically improving. ? ?Assessment and Plan: ? ?Sepsis ?Neutropenic fever ?Multifocal pneumonia ?-patient admitted with fever, leukopenia, tachycardia , hypotension and elevated lactate ?-Patient was placed on IV fluids and IV antibiotics. ?-Blood cultures showed no growth, urine culture with insignificant growth, transition to oral cefdinir.  Continue for 3 more days to complete full course of 7 days. ? ?  ?Multifocal pneumonia ?-Unclear how much of this finding is his progressive malignancy.  Initially placed on IV vancomycin and cefepime, now transitioned to oral cefdinir. ?Continue for 3 more days to complete full course for 7 days ?  ?Acute COPD exacerbation ?-Continue Trelegy, albuterol inhaler as needed at home, placed on prednisone 40 mg daily for 5 days ?-Patient feels close to his baseline and prefers to go home ?  ?Pancytopenia ?-Likely related to recent chemotherapy ?-Now with leukocytosis, Granix has been discontinued ?-Platelets 120 K, hemoglobin 8.9 ?  ?Ischemic cardiomyopathy ?-Last echo per cardiology records in 12/2019, from [facility] with  EF of 40% ?-Echo updated with normal LVEF ?  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 Julien Nordmann ?-on chemo with lurbinectedin every 3 weeks, first dose 07/16/21 ?  ?History of urinary retention ?-resume flomax ?  ?Hyperlipidemia ?-continued on statin ?  ?Generalized weakness ?PT evaluation was done, recommended home health PT, was arranged by case management ? ?Pain control - [employer] Controlled Substance Reporting System database was reviewed. and patient was instructed, not to drive, operate heavy machinery, perform activities at heights, swimming or participation in water activities or provide baby-sitting services while on Pain, Sleep and Anxiety Medications; until their outpatient Physician has advised to do so again. Also recommended to not to take more than prescribed Pain, Sleep and Anxiety Medications.  ?Consultants: Oncology ?Procedures performed: None ?Disposition: Home ?Diet recommendation:  ?Discharge Diet Orders (From admission, onward)  ? ?  Start     Ordered  ? 07/31/21 0000  Diet - low sodium heart healthy       ? 07/31/21 1223  ? ?  ?  ? ?  ? ?Cardiac diet ?DISCHARGE MEDICATION: ?Allergies as of 07/31/2021   ? ?   Reactions  ? Cosela [trilaciclib] Other (See Comments)  ? thrombophlebitis  ? ?  ? ?  ?Medication List  ?  ? ?TAKE these medications   ? ?albuterol 108 (90 Base) MCG/ACT inhaler ?Commonly known as: VENTOLIN HFA ?Inhale 2 puffs into the lungs every 6 (six) hours as needed for wheezing or shortness of breath. ?  ?AMBULATORY NON FORMULARY MEDICATION ?Please provide 4 wheel rolling walker with seat for Gerald [name].  Diagnosis: R26.81  Gait instability ?  ?aspirin EC 81 MG tablet ?Take 1 tablet (81 mg total) by mouth daily. Swallow whole. ?  ?cefdinir 300 MG capsule ?Commonly known as: OMNICEF ?Take 1 capsule (300 mg total) by mouth 2 (two) times daily for 3 days. ?  ?clobetasol cream 0.05 % ?Commonly known as: TEMOVATE ?Apply 1 application topically  daily as needed (Psoriasis). ?  ?guaiFENesin 600 MG 12 hr tablet ?Commonly known as: [place] ?Take 1 tablet (600 mg total) by mouth 2 (two) times daily for 7 days. ?  ?hydrocortisone 1 % ointment ?Apply 1 application. topically 2 (two) times daily. To the rash. Please follow-up with your dermatologist. ?  ?lidocaine-prilocaine cream ?Commonly known as: EMLA ?Apply to the Port-A-Cath site 30-60-minute before chemotherapy. ?  ?niacinamide 500 MG tablet ?Take 500 mg by mouth daily with breakfast. ?  ?nicotine 14 mg/24hr patch ?Commonly known as: NICODERM CQ - dosed in mg/24 hours ?Place 14 mg onto the skin daily. ?  ?predniSONE 20 MG tablet ?Commonly known as: DELTASONE ?Take 2 tablets (40 mg total) by mouth daily with breakfast for 5 days. ?  ?PRESERVISION AREDS 2 PO ?Take 1 tablet by mouth in the morning and at bedtime. ?  ?prochlorperazine 10 MG tablet ?Commonly known as: COMPAZINE ?Take 1 tablet (10 mg total) by mouth every 6 (six) hours as needed. ?  ?rosuvastatin 20 MG tablet ?Commonly known as: CRESTOR ?Take 1 tablet (20 mg total) by mouth daily. ?  ?tamsulosin 0.4 MG Caps capsule ?Commonly known as: FLOMAX ?Take 0.4 mg by mouth. ?  ?Trelegy Ellipta 100-62.5-25 MCG/ACT Aepb ?Generic drug: Fluticasone-Umeclidin-Vilant ?Inhale 1 puff into the lungs daily. ?  ?triamcinolone cream 0.1 % ?Commonly known as: KENALOG ?Apply 1 application topically daily as needed (skin irritation.). ?  ?vitamin B-12 500 MCG tablet ?Commonly known as: CYANOCOBALAMIN ?Take 500 mcg by mouth every evening. ?  ? ?  ? ? Follow-up Information   ? ? [name], DO. Schedule an appointment as soon as possible for a visit in 2 week(s).   ?Specialty: Family Medicine ?Contact information: ?[address] ?[place] [address] ?[phone] ? ? ?  ?  ? ? Buford Dresser, MD .   ?Specialty: Cardiology ?Contact information: ?[address] ?[address] ?[place] [address] ?[phone] ? ? ?  ?  ? ? Care, [facility] Follow up.   ?Specialty: Home Health Services ?Contact information: ?[address] ?[address] ?[place] [address] ?[phone] ? ? ?  ?  ? ?  ?  ? ?  ? ?Discharge Exam: ?Filed Weights  ? 07/26/21 2101 07/27/21 1005  ?Weight: 92.1 kg 93.8 kg  ? ?S: Mild wheezing however patient feels that he is at his baseline and would prefer to go home.  No fevers or cough. ? ?Vitals:  ? 07/30/21 2213 07/31/21 0547 07/31/21 0912 07/31/21 0916  ?BP: (!) 96/56 106/60    ?Pulse: 86 86    ?Resp: 18 18    ?Temp: 97.9 ?F (36.6 ?C) 97.7 ?F (36.5 ?C)    ?TempSrc: Oral Oral    ?SpO2: (!) 89% 94% 95% 95%  ?Weight:      ?Height:      ? Physical Exam ?General: Alert and oriented x 3, NAD ?Cardiovascular: S1 S2 clear, RRR. No pedal edema b/l ?Respiratory: Mild expiratory wheezing, patient feels however much improving ?Gastrointestinal: Soft, nontender, nondistended, NBS ?Ext: no pedal edema bilaterally ?Neuro: no new deficits ?Psych: Normal affect and demeanor, alert  and oriented x3  ? ? ?Condition at discharge: good ? ?The results of significant diagnostics from this hospitalization (including imaging, microbiology, ancillary and laboratory) are listed below for reference.  ? ?Imaging Studies: ?CT Chest W Contrast ? ?Result Date: 07/05/2021 ?CLINICAL DATA:  Small cell lung cancer restaging * Tracking Code: BO * EXAM: CT CHEST WITH CONTRAST TECHNIQUE: Multidetector CT imaging of the chest was performed during intravenous contrast administration. RADIATION DOSE REDUCTION: This exam was performed according to the departmental dose-optimization program which includes automated exposure control, adjustment of the mA and/or kV according to patient size and/or use of iterative reconstruction technique. CONTRAST:  155mL OMNIPAQUE IOHEXOL 300 MG/ML  SOLN COMPARISON:  Multiple exams, including 04/24/2021 FINDINGS: Cardiovascular: Left Port-A-Cath tip: Cavoatrial junction. Coronary, aortic arch, and branch vessel atherosclerotic vascular  disease. Upper normal heart size. Mediastinum/Nodes: Hypodense right thyroid nodule 2.6 cm in long axis on image 14 series 504. Enlarged right paratracheal nodal mass 5.6 by 4.8 cm on image 25 series 504, pr

## 2021-07-31 NOTE — Progress Notes (Signed)
Occupational Therapy Treatment ?Patient Details ?Name: Gerald [name] ?MRN: [id number] ?DOB: 05-01-1940 ?Today's Date: 07/31/2021 ? ? ?History of present illness Gerald [name] is a 81 y.o. male admitted 4/28 presenting with weakness, shortness of breath, and fever. He had a new chemo regimen about 11 days ago. 4 days ago, he noticed that he was more short of breath and felt weaker.  PMH: COPD, small cell lung cancer, HLD, HTN, urinary retension, CAD. ?  ?OT comments ? Treatment focusing on safety and activity tolerance with showering, toileting and dressing. Patient overall min assist to supervision for ADLs requiring intermittent verbal cues for task and set up. Patient had no overt loss of balance with transfers or ambulation with RW. Patient's o2 sat 86% after showering on RA. He needed min assist for donning LB clothing due to [facility]. Recommend supervision for showers at home and use of shower chair and RW. Patient's family verbalize understanding.   ? ?Recommendations for follow up therapy are one component of a multi-disciplinary discharge planning process, led by the attending physician.  Recommendations may be updated based on patient status, additional functional criteria and insurance authorization. ?   ?Follow Up Recommendations ? Home health OT  ?  ?Assistance Recommended at Discharge Frequent or constant Supervision/Assistance  ?Patient can return home with the following ? A little help with walking and/or transfers;A little help with bathing/dressing/bathroom;Assist for transportation;Help with stairs or ramp for entrance ?  ?Equipment Recommendations ? None recommended by OT  ?  ?Recommendations for Other Services   ? ?  ?Precautions / Restrictions Precautions ?Precautions: Fall ?Precaution Comments: monitor O2 ?Restrictions ?Weight Bearing Restrictions: No  ? ? ?  ? ?Mobility Bed Mobility ?  ?  ?  ?  ?  ?  ?  ?  ?  ? ?Transfers ?  ?  ?  ?  ?  ?  ?  ?  ?  ?  ?  ?  ?Balance Overall balance assessment: Mild  deficits observed, not formally tested ?  ?  ?  ?  ?  ?  ?  ?  ?  ?  ?  ?  ?  ?  ?  ?  ?  ?  ?   ? ?ADL either performed or assessed with clinical judgement  ? ?ADL Overall ADL's : Needs assistance/impaired ?  ?  ?  ?  ?Upper Body Bathing: Set up;Sitting ?Upper Body Bathing Details (indicate cue type and reason): on shower bench. Able to perform UB bathing - has assistance for back ?Lower Body Bathing: Minimal assistance;Sit to/from stand ?Lower Body Bathing Details (indicate cue type and reason): min assist for peri anal care (for quality) and  verbal cues to attend to certain body parts ?Upper Body Dressing : Set up;Sitting ?Upper Body Dressing Details (indicate cue type and reason): donned a t shirt ?Lower Body Dressing: Sit to/from stand;Minimal assistance ?Lower Body Dressing Details (indicate cue type and reason): had assistance to get clothing over feet due to being short of breathe from showering. Able to pull clothing up over hips and don shoes. ?Toilet Transfer: Min guard;Regular Toilet;Rolling walker (2 wheels);Grab bars ?  ?Toileting- Clothing Manipulation and Hygiene: Supervision/safety;Sit to/from stand ?  ?Tub/ Banker: Grab bars;Shower seat;Min guard ?Tub/Shower Transfer Details (indicate cue type and reason): verbal cues for hand placements ?Functional mobility during ADLs: Min guard;Rolling walker (2 wheels) ?General ADL Comments: Rolling walkto to ambualte to and from bathroom - but mostly hand holds to manuever in bathroom. ?  ? ?  Extremity/Trunk Assessment Upper Extremity Assessment ?Upper Extremity Assessment: Overall WFL for tasks assessed ?  ?Lower Extremity Assessment ?Lower Extremity Assessment: Defer to PT evaluation ?  ?Cervical / Trunk Assessment ?Cervical / Trunk Assessment: Normal ?  ? ?Vision Patient Visual Report: No change from baseline ?  ?  ?Perception   ?  ?Praxis   ?  ? ?Cognition Arousal/Alertness: Awake/alert ?Behavior During Therapy: [facility] for tasks  assessed/performed ?Overall Cognitive Status: Within Functional Limits for tasks assessed ?  ?  ?  ?  ?  ?  ?  ?  ?  ?  ?  ?  ?  ?  ?  ?  ?General Comments: may have some short term memory deficits - Rn reports hx of dementia ?  ?  ?   ?Exercises   ? ?  ?Shoulder Instructions   ? ? ?  ?General Comments    ? ? ?Pertinent Vitals/ Pain       Pain Assessment ?Pain Assessment: No/denies pain ? ?Home Living   ?  ?  ?  ?  ?  ?  ?  ?  ?  ?  ?  ?  ?  ?  ?  ?  ?  ?  ? ?  ?Prior Functioning/Environment    ?  ?  ?  ?   ? ?Frequency ? Min 2X/week  ? ? ? ? ?  ?Progress Toward Goals ? ?OT Goals(current goals can now be found in the care plan section) ? Progress towards OT goals: Progressing toward goals ? ?Acute Rehab OT Goals ?Patient Stated Goal: to take a shower ?OT Goal Formulation: With patient/family ?Time For Goal Achievement: 08/13/21  ?Plan Discharge plan remains appropriate   ? ?Co-evaluation ? ? ?   ?  ?  ?  ?  ? ?  ?AM-PAC OT "6 Clicks" Daily Activity     ?Outcome Measure ? ? Help from another person eating meals?: None ?Help from another person taking care of personal grooming?: A Little ?Help from another person toileting, which includes using toliet, bedpan, or urinal?: A Little ?Help from another person bathing (including washing, rinsing, drying)?: A Little ?Help from another person to put on and taking off regular upper body clothing?: A Little ?Help from another person to put on and taking off regular lower body clothing?: A Little ?6 Click Score: 19 ? ?  ?End of Session Equipment Utilized During Treatment: Oxygen;Rolling walker (2 wheels) ? ?OT Visit Diagnosis: Unsteadiness on feet (R26.81);Other abnormalities of gait and mobility (R26.89) ?  ?Activity Tolerance Patient tolerated treatment well ?  ?Patient Left in chair;with call bell/phone within reach;with chair alarm set;with nursing/sitter in room;with family/visitor present ?  ?Nurse Communication Mobility status ?  ? ?   ? ?Time: 3762-8315 ?OT Time  Calculation (min): 20 min ? ?Charges: OT General Charges ?$OT Visit: 1 Visit ?OT Treatments ?$Self Care/Home Management : 8-22 mins ? ?Gerald [name], Gerald [name] ?Acute Care Rehab Services  ?Office [phone] ?Pager: [phone]  ? ?Gerald [name] L Gerald [name] ?07/31/2021, 12:38 PM ?

## 2021-07-31 NOTE — TOC Initial Note (Signed)
Transition of Care (TOC) - Initial/Assessment Note  ? ? ?Patient Details  ?Name: Gerald [name] ?MRN: [id number] ?Date of Birth: Mar 22, 1941 ? ?Transition of Care (TOC) CM/SW Contact:    ?Steel Kerney, Marjie Skiff, RN ?Phone Number: ?07/31/2021, 12:53 PM ? ?Clinical Narrative:                 ?Pt offered choice for home health services and [facility] chosen. [facility] liaison contacted for referral. Pt has a Rolator at home currently. Pt currently is on home 02 as well. ? ?Expected Discharge Plan: [place] ?Barriers to Discharge: No Barriers Identified ? ? ?Patient Goals and CMS Choice ?Patient states their goals for this hospitalization and ongoing recovery are:: To go home ?CMS Medicare.gov Compare Post Acute Care list provided to:: Patient ?Choice offered to / list presented to : Patient ? ?Expected Discharge Plan and Services ?Expected Discharge Plan: [place] ?  ?Discharge Planning Services: CM Consult ?Post Acute Care Choice: Home Health ?Living arrangements for the past 2 months: [place] ?Expected Discharge Date: 07/31/21               ?  ?  ?  ?  ?  ?HH Arranged: PT, OT ?[place] Agency: [place] ?Date HH Agency Contacted: 07/31/21 ?Time [place]: 2952 ?Representative spoke with at [place]: Alvis Lemmings ? ?Prior Living Arrangements/Services ?Living arrangements for the past 2 months: [place] ?Lives with:: Spouse ?Patient language and need for interpreter reviewed:: Yes ?Do you feel safe going back to the place where you live?: Yes      ?Need for Family Participation in Patient Care: Yes (Comment) ?Care giver support system in place?: Yes (comment) ?  ?Criminal Activity/Legal Involvement Pertinent to Current Situation/Hospitalization: No - Comment as needed ? ?Activities of Daily Living ?Home Assistive Devices/Equipment: Eyeglasses, Hearing aid ?ADL Screening (condition at time of admission) ?Patient's cognitive ability adequate to safely complete daily  activities?: No ?Is the patient deaf or have difficulty hearing?: No (wears hearing aids) ?Does the patient have difficulty seeing, even when wearing glasses/contacts?: No ?Does the patient have difficulty concentrating, remembering, or making decisions?: No ?Patient able to express need for assistance with ADLs?: Yes ?Does the patient have difficulty dressing or bathing?: Yes ?Independently performs ADLs?: Yes (appropriate for developmental age) ?Does the patient have difficulty walking or climbing stairs?: No ?Weakness of Legs: Both ?Weakness of Arms/Hands: Both ? ?Permission Sought/Granted ?Permission sought to share information with : Customer service manager ?Permission granted to share information with : Yes, Verbal Permission Granted ?   ? Permission granted to share info w AGENCY: Alvis Lemmings ?   ?   ? ?Emotional Assessment ?  ?  ?  ?Orientation: : Oriented to Self, Oriented to Place, Oriented to  Time, Oriented to Situation ?Alcohol / Substance Use: Not Applicable ?Psych Involvement: No (comment) ? ?Admission diagnosis:  Neutropenic fever ([place]) [D70.9, R50.81] ?Patient Active Problem List  ? Diagnosis Date Noted  ? COPD (chronic obstructive pulmonary disease) ([place]) 07/28/2021  ? HLD (hyperlipidemia) 07/27/2021  ? History of urinary retention 07/27/2021  ? Neutropenic fever ([place]) 07/26/2021  ? HTN (hypertension) 07/24/2021  ? Constipation 07/24/2021  ? Port-A-Cath in place 05/28/2021  ? Gait instability 05/10/2021  ? Elevated LFTs 05/09/2021  ? Pancytopenia ([place]) 04/29/2021  ? Liver function abnormality 04/27/2021  ? Acute urinary retention 04/26/2021  ? Sepsis ([place]) 04/25/2021  ? Exudative pleural effusion 04/25/2021  ? AKI (acute kidney injury) ([place])  04/25/2021  ? Ectropion 04/25/2021  ? Blepharitis 04/25/2021  ? Multifocal pneumonia 04/24/2021  ? Encounter for antineoplastic chemotherapy 04/22/2021  ? Encounter for antineoplastic immunotherapy 04/22/2021  ? Pressure ulcer, stage 2 ([place]) 04/21/2021  ?  Small cell lung cancer ([place]) 04/10/2021  ? 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 Adenopathy 03/29/2021  ? Endobronchial cancer, right ([place])   ? 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 History of bradycardia 12/06/2020  ? Coronary artery disease due to calcified coronary lesion 12/06/2020  ? Pure hypercholesterolemia 12/06/2020  ? Achilles tendon pain 10/08/2020  ? Psoriasis 09/18/2020  ? Ischemic cardiomyopathy   ? Centrilobular emphysema ([place]) 10/26/2019  ? Tobacco abuse 10/26/2019  ? Goals of care, counseling/discussion 10/15/2019  ? Primary cancer of right lower lobe of lung ([place]) 10/12/2019  ? Lung nodule 09/27/2019  ? Chronic respiratory insufficiency 07/21/2016  ? ?PCP:  [name], DO ?Pharmacy:   ?[facility], [address] ?[place] [address] ?Phone: [phone] Fax: [phone] ? ? ? ? ?Social Determinants of Health (SDOH) Interventions ?  ? ?Readmission Risk Interventions ? ?  07/31/2021  ? 12:49 PM  ?Readmission Risk Prevention Plan  ?Transportation Screening Complete  ?PCP or Specialist Appt within 5-7 Days Complete  ?Home Care Screening Complete  ?Medication Review (RN CM) Complete  ? ? ? ?

## 2021-08-01 LAB — CULTURE, BLOOD (ROUTINE X 2)
Culture: NO GROWTH
Culture: NO GROWTH
Special Requests: ADEQUATE
Special Requests: ADEQUATE

## 2021-08-02 ENCOUNTER — Telehealth: Payer: Self-pay | Admitting: General Practice

## 2021-08-02 NOTE — Telephone Encounter (Signed)
Transition Care Management Follow-up Telephone Call ?Date of discharge and from where: 07/31/21 from [facility] ?How have you been since you were released from the hospital? His wife states that he is doing ok but he is weak.  ?Any questions or concerns? No ? ?Items Reviewed: ?Did the pt receive and understand the discharge instructions provided? Yes  ?Medications obtained and verified? No  ?Other? No  ?Any new allergies since your discharge? No  ?Dietary orders reviewed? No ?Do you have support at home? Yes  ? ?Home Care and Equipment/Supplies: ?Were home health services ordered? yes ?If so, what is the name of the agency?  [facility] ?Has the agency set up a time to come to the patient's home? Not yet; but his wife is planning to call them. ?Were any new equipment or medical supplies ordered?  Yes: Oxygen ?What is the name of the medical supply agency? [facility] ?Were you able to get the supplies/equipment? Yes ?Do you have any questions related to the use of the equipment or supplies? No ? ?Functional Questionnaire: (I = Independent and D = Dependent) ?ADLs: D ? ?Bathing/Dressing- I ? ?Meal Prep- I ? ?Eating- I ? ?Maintaining continence- I ? ?Transferring/Ambulation- D ? ?Managing Meds- I ? ?Follow up appointments reviewed: ? ?PCP Hospital f/u appt confirmed? Yes  Scheduled to see PCP on 08/14/21. ?[facility] f/u appt confirmed? No   ?Are transportation arrangements needed? No  ?If their condition worsens, is the pt aware to call PCP or go to the Emergency Dept.? Yes ?Was the patient provided with contact information for the PCP's office or ED? Yes ?Was to pt encouraged to call back with questions or concerns? Yes  ?

## 2021-08-03 NOTE — Progress Notes (Signed)
Gerald [name] ?OFFICE PROGRESS NOTE ? ?Gerald Nutting, DO ?[address] ?[place] [address] ? ?DIAGNOSIS:  Extensive stage (T2 a, N2, M1b) small cell lung cancer that was initially diagnosed as synchronous stage Ia non-small cell lung cancer, squamous cell carcinoma involving right upper lobe pulmonary nodule in addition to right lower lobe endobronchial lesion diagnosed in June 2021.  The patient has evidence for disease recurrence and metastasis in December 2022 ? ?PRIOR THERAPY: ?1) SBRT to the right upper lobe pulmonary nodule as well as the right lower lobe pulmonary nodule under the care of Dr. Sondra Come completed on 11/14/2019. ?2) Palliative systemic chemotherapy with carboplatin for an AUC of 5, etoposide 100 mg/m2, and Imfinzi 1500 mg IV every 3 weeks with Cosela.  First dose expected on 04/16/21.  Status post 4 cycles. ? ?CURRENT THERAPY: Zepzelca (lurbinectedin) 3.2 Mg/M2 every 3 weeks.  First dose July 16, 2021.  Status post 1 cycle. ?  ? ?INTERVAL HISTORY: ?Gerald [name] 81 y.o. male returns to the clinic today for a follow-up visit.  The patient was last seen in clinic on 07/23/2021.  The patient was recently found to have evidence of disease progression on imaging studies.  Therefore, his treatment with his prior chemotherapy was discontinued and switched to [place].  The patient received his first dose of treatment on 07/16/2021.  On 07/26/2021, the patient presented to the emergency room with neutropenia, generalized weakness, fever, and shortness of breath.  He was treated for neutropenic fever, possible multifocal pneumonia, and sepsis.  The patient received Granix in patient. He also had a blood transfusion.  He completed his outpatient course of antibiotics a day or two ago.  He is also currently undergoing physical therapy at home.  The patient had imaging in the hospital which showed possible multifocal pneumonia versus progressive malignancy. ? ?Since being  discharged, the patient is feeling weak.  Family also noted that he has been endorsing hot flashes recently.  Of note, the patient was discharged home on prednisone which she completed yesterday.  However, they noted that his breathing has improved in the last 2 days or so.  He has not been using the supplemental oxygen continuously as he had prior.  He is on 2 L of oxygen if needed.  His wife notes that he is able to walk to the bathroom better than since being discharged.  Of course, he still has some dyspnea on exertion and sometimes his oxygen will drop with exertion.  The home health nurse was at the house yesterday noted that his blood pressure was 110/61.  His blood pressure is 77/49 today.  He had 2 episodes of diarrhea this morning but otherwise denies any diarrhea.  Denies any nausea or vomiting.  He had an upset stomach Sunday night which his wife attributes to overeating.  He eats well at home but does not hydrate as well as what his family would like.  The patient's wife notes that he also coughs last.  He takes Mucinex.  His sputum is white/creamy.  Denies any hemoptysis or chest pain.  Denies any headache or visual changes.  The patient is here today in for repeat blood work before considering starting cycle #2.  ? ?MEDICAL HISTORY: ?Past Medical History:  ?Diagnosis Date  ? Bradycardia   ? Bursitis   ? Carotid artery disease ([place])   ? COPD (chronic obstructive pulmonary disease) ([place])   ? Coronary artery disease   ? CTO mid-dist  CX (fills from left-to-left collaterals), 99% OM2, 30% ost-dist LM, 30% prox-mid LAD, 30% ost-mid CX, 99% prox RCA (too small for PCI), medical management 02/09/20  ? Hypercholesterolemia   ? Ischemic cardiomyopathy   ? Lung cancer ([place])   ? Lung nodule   ? nscl ca dx'd 08/2019  ? Psoriasis   ? Tobacco abuse   ? Wears glasses   ? reading  ? ? ?ALLERGIES:  is allergic to cosela [trilaciclib]. ? ?MEDICATIONS:  ?Current Outpatient Medications  ?Medication Sig Dispense Refill  ?  albuterol (VENTOLIN HFA) 108 (90 Base) MCG/ACT inhaler Inhale 2 puffs into the lungs every 6 (six) hours as needed for wheezing or shortness of breath. 18 g 2  ? AMBULATORY NON FORMULARY MEDICATION Please provide 4 wheel rolling walker with seat for [employer].  Diagnosis: R26.81 Gait instability 1 Device 0  ? aspirin EC 81 MG tablet Take 1 tablet (81 mg total) by mouth daily. Swallow whole. 90 tablet 3  ? clobetasol cream (TEMOVATE) 1.85 % Apply 1 application topically daily as needed (Psoriasis).    ? Fluticasone-Umeclidin-Vilant (TRELEGY ELLIPTA) 100-62.5-25 MCG/ACT AEPB Inhale 1 puff into the lungs daily. 60 each 11  ? guaiFENesin (MUCINEX) 600 MG 12 hr tablet Take 1 tablet (600 mg total) by mouth 2 (two) times daily for 7 days. 14 tablet 0  ? hydrocortisone 1 % ointment Apply 1 application. topically 2 (two) times daily. To the rash. Please follow-up with your dermatologist. 30 g 0  ? lidocaine-prilocaine (EMLA) cream Apply to the Port-A-Cath site 30-60-minute before chemotherapy. 30 g 0  ? Multiple Vitamins-Minerals (PRESERVISION AREDS 2 PO) Take 1 tablet by mouth in the morning and at bedtime.    ? niacinamide 500 MG tablet Take 500 mg by mouth daily with breakfast.    ? nicotine (NICODERM CQ - DOSED IN MG/24 HOURS) 14 mg/24hr patch Place 14 mg onto the skin daily.    ? prochlorperazine (COMPAZINE) 10 MG tablet Take 1 tablet (10 mg total) by mouth every 6 (six) hours as needed. 30 tablet 2  ? rosuvastatin (CRESTOR) 20 MG tablet Take 1 tablet (20 mg total) by mouth daily. 90 tablet 3  ? tamsulosin (FLOMAX) 0.4 MG CAPS capsule Take 0.4 mg by mouth.    ? triamcinolone cream (KENALOG) 0.1 % Apply 1 application topically daily as needed (skin irritation.).    ? vitamin B-12 (CYANOCOBALAMIN) 500 MCG tablet Take 500 mcg by mouth every evening.    ? ?No current facility-administered medications for this visit.  ? ? ?SURGICAL HISTORY:  ?Past Surgical History:  ?Procedure Laterality Date  ? BRONCHIAL BIOPSY   09/27/2019  ? Procedure: BRONCHIAL BIOPSIES;  Surgeon: Garner Nash, DO;  Location: [place];  Service: Pulmonary;;  ? BRONCHIAL BRUSHINGS  09/27/2019  ? Procedure: BRONCHIAL BRUSHINGS;  Surgeon: Garner Nash, DO;  Location: [place];  Service: Pulmonary;;  ? BRONCHIAL NEEDLE ASPIRATION BIOPSY  09/27/2019  ? Procedure: BRONCHIAL NEEDLE ASPIRATION BIOPSIES;  Surgeon: Garner Nash, DO;  Location: [place];  Service: Pulmonary;;  ? BRONCHIAL NEEDLE ASPIRATION BIOPSY  03/29/2021  ? Procedure: BRONCHIAL NEEDLE ASPIRATION BIOPSIES;  Surgeon: Garner Nash, DO;  Location: [place];  Service: Pulmonary;;  ? BRONCHIAL WASHINGS  09/27/2019  ? Procedure: BRONCHIAL WASHINGS;  Surgeon: Garner Nash, DO;  Location: [facility];  Service: Pulmonary;;  ? BRONCHIAL WASHINGS  03/29/2021  ? Procedure: BRONCHIAL WASHINGS;  Surgeon: Garner Nash, DO;  Location: [facility];  Service: Pulmonary;;  ?  COLONOSCOPY    ? CRYOTHERAPY  09/27/2019  ? Procedure: CRYOTHERAPY;  Surgeon: Garner Nash, DO;  Location: [facility] [facility];  Service: Pulmonary;;  ? CRYOTHERAPY  03/29/2021  ? Procedure: CRYOTHERAPY;  Surgeon: Garner Nash, DO;  Location: [facility];  Service: Pulmonary;;  ? FIDUCIAL MARKER PLACEMENT  09/27/2019  ? Procedure: FIDUCIAL MARKER PLACEMENT;  Surgeon: Garner Nash, DO;  Location: [facility];  Service: Pulmonary;;  ? HEMOSTASIS CONTROL  09/27/2019  ? Procedure: HEMOSTASIS CONTROL;  Surgeon: Garner Nash, DO;  Location: [facility];  Service: Pulmonary;;  ? HERNIA REPAIR    ? umbicial- no longer as a "belly button"  ? IR IMAGING GUIDED PORT INSERTION  05/21/2021  ? IR US GUIDE VASC ACCESS LEFT  05/21/2021  ? RIGHT/LEFT HEART CATH AND CORONARY ANGIOGRAPHY N/A 02/09/2020  ? Procedure: RIGHT/LEFT HEART CATH AND CORONARY ANGIOGRAPHY;  Surgeon: Burnell Blanks, MD;  Location: [facility];  Service: Cardiovascular;  Laterality: N/A;  ? VIDEO BRONCHOSCOPY WITH  ENDOBRONCHIAL NAVIGATION N/A 09/27/2019  ? Procedure: VIDEO BRONCHOSCOPY WITH ENDOBRONCHIAL NAVIGATION;  Surgeon: Garner Nash, DO;  Location: [facility];  Service: Pulmonary;  Laterality: N/A;  ? VIDEO BRONCHOSCOPY

## 2021-08-05 ENCOUNTER — Telehealth: Payer: Self-pay

## 2021-08-05 MED FILL — Dexamethasone Sodium Phosphate Inj 100 MG/10ML: INTRAMUSCULAR | Qty: 1 | Status: AC

## 2021-08-05 NOTE — Telephone Encounter (Signed)
Vm from [facility] at [place] [phone]). Requesting VO for nursing evaluation.  ? ?VO given. Documentation to be faxed to Dr. Zigmund Daniel for signature. ?

## 2021-08-06 ENCOUNTER — Other Ambulatory Visit: Payer: Self-pay

## 2021-08-06 ENCOUNTER — Telehealth: Payer: Self-pay | Admitting: Internal Medicine

## 2021-08-06 ENCOUNTER — Inpatient Hospital Stay: Payer: Medicare Other

## 2021-08-06 ENCOUNTER — Telehealth: Payer: Self-pay

## 2021-08-06 ENCOUNTER — Ambulatory Visit: Payer: Medicare Other

## 2021-08-06 ENCOUNTER — Ambulatory Visit: Payer: Medicare Other | Admitting: Physician Assistant

## 2021-08-06 ENCOUNTER — Inpatient Hospital Stay: Payer: Medicare Other | Attending: Internal Medicine | Admitting: Physician Assistant

## 2021-08-06 ENCOUNTER — Other Ambulatory Visit: Payer: Medicare Other

## 2021-08-06 VITALS — BP 77/49 | HR 89 | Temp 97.3°F | Resp 18

## 2021-08-06 VITALS — BP 98/50

## 2021-08-06 DIAGNOSIS — Z79899 Other long term (current) drug therapy: Secondary | ICD-10-CM | POA: Insufficient documentation

## 2021-08-06 DIAGNOSIS — C3490 Malignant neoplasm of unspecified part of unspecified bronchus or lung: Secondary | ICD-10-CM | POA: Diagnosis not present

## 2021-08-06 DIAGNOSIS — C3411 Malignant neoplasm of upper lobe, right bronchus or lung: Secondary | ICD-10-CM | POA: Diagnosis present

## 2021-08-06 DIAGNOSIS — I959 Hypotension, unspecified: Secondary | ICD-10-CM

## 2021-08-06 DIAGNOSIS — Z5111 Encounter for antineoplastic chemotherapy: Secondary | ICD-10-CM | POA: Insufficient documentation

## 2021-08-06 DIAGNOSIS — Z95828 Presence of other vascular implants and grafts: Secondary | ICD-10-CM

## 2021-08-06 DIAGNOSIS — C3431 Malignant neoplasm of lower lobe, right bronchus or lung: Secondary | ICD-10-CM | POA: Insufficient documentation

## 2021-08-06 LAB — CMP (CANCER CENTER ONLY)
ALT: 29 U/L (ref 0–44)
AST: 21 U/L (ref 15–41)
Albumin: 3.3 g/dL — ABNORMAL LOW (ref 3.5–5.0)
Alkaline Phosphatase: 81 U/L (ref 38–126)
Anion gap: 8 (ref 5–15)
BUN: 24 mg/dL — ABNORMAL HIGH (ref 8–23)
CO2: 33 mmol/L — ABNORMAL HIGH (ref 22–32)
Calcium: 9.2 mg/dL (ref 8.9–10.3)
Chloride: 99 mmol/L (ref 98–111)
Creatinine: 0.97 mg/dL (ref 0.61–1.24)
GFR, Estimated: >60 mL/min (ref >60)
Glucose, Bld: 99 mg/dL (ref 70–99)
Potassium: 3.8 mmol/L (ref 3.5–5.1)
Sodium: 140 mmol/L (ref 135–145)
Total Bilirubin: 0.6 mg/dL (ref 0.3–1.2)
Total Protein: 6.2 g/dL — ABNORMAL LOW (ref 6.5–8.1)

## 2021-08-06 LAB — CBC WITH DIFFERENTIAL (CANCER CENTER ONLY)
Abs Immature Granulocytes: 0.4 10*3/uL — ABNORMAL HIGH (ref 0.00–0.07)
Basophils Absolute: 0 10*3/uL (ref 0.0–0.1)
Basophils Relative: 0 %
Eosinophils Absolute: 0 10*3/uL (ref 0.0–0.5)
Eosinophils Relative: 0 %
HCT: 34.6 % — ABNORMAL LOW (ref 39.0–52.0)
Hemoglobin: 10.8 g/dL — ABNORMAL LOW (ref 13.0–17.0)
Immature Granulocytes: 4 %
Lymphocytes Relative: 13 %
Lymphs Abs: 1.5 10*3/uL (ref 0.7–4.0)
MCH: 30.7 pg (ref 26.0–34.0)
MCHC: 31.2 g/dL (ref 30.0–36.0)
MCV: 98.3 fL (ref 80.0–100.0)
Monocytes Absolute: 1.1 10*3/uL — ABNORMAL HIGH (ref 0.1–1.0)
Monocytes Relative: 9 %
Neutro Abs: 8.4 10*3/uL — ABNORMAL HIGH (ref 1.7–7.7)
Neutrophils Relative %: 74 %
Platelet Count: 193 10*3/uL (ref 150–400)
RBC: 3.52 MIL/uL — ABNORMAL LOW (ref 4.22–5.81)
RDW: 17.4 % — ABNORMAL HIGH (ref 11.5–15.5)
WBC Count: 11.5 10*3/uL — ABNORMAL HIGH (ref 4.0–10.5)
nRBC: 0.5 % — ABNORMAL HIGH (ref 0.0–0.2)

## 2021-08-06 MED ORDER — SODIUM CHLORIDE 0.9 % IV SOLN: Freq: Once | INTRAVENOUS | Status: AC

## 2021-08-06 MED ORDER — SODIUM CHLORIDE 0.9% FLUSH
10.0000 mL | Freq: Once | INTRAVENOUS | Status: AC
  Administered 2021-08-06: 10 mL

## 2021-08-06 MED ORDER — SODIUM CHLORIDE 0.9% FLUSH
10.0000 mL | Freq: Once | INTRAVENOUS | Status: AC
  Administered 2021-08-06: 10 mL via INTRAVENOUS

## 2021-08-06 MED ORDER — HEPARIN SOD (PORK) LOCK FLUSH 100 UNIT/ML IV SOLN
500.0000 [IU] | Freq: Once | INTRAVENOUS | Status: AC
  Administered 2021-08-06: 500 [IU] via INTRAVENOUS

## 2021-08-06 NOTE — Progress Notes (Signed)
Per Cassie H, PA only doing hydration fluids today. Holding treatment. ?

## 2021-08-06 NOTE — Telephone Encounter (Signed)
Received VM requesting VO's from: ? ?[facility] for lower body strengthening and endurance, gait training. Requesting 1 x 8wks ?Quintella Reichert for evaluation. 2 x 1 week, 1 x 5 wks ? ?VO's granted. Documentaion to be faxed to Dr. Zigmund Daniel for signature. ?

## 2021-08-06 NOTE — Telephone Encounter (Signed)
.  Called patient to schedule appointment per 5.9 inbasket, patient is aware of date and time.   ?

## 2021-08-06 NOTE — Patient Instructions (Signed)
Rehydration, Adult Rehydration is the replacement of body fluids, salts, and minerals (electrolytes) that are lost during dehydration. Dehydration is when there is not enough water or other fluids in the body. This happens when you lose more fluids than you take in. Common causes of dehydration include: Not drinking enough fluids. This can occur when you are ill or doing activities that require a lot of energy, especially in hot weather. Conditions that cause loss of water or other fluids, such as diarrhea, vomiting, sweating, or urinating a lot. Other illnesses, such as fever or infection. Certain medicines, such as those that remove excess fluid from the body (diuretics). Symptoms of mild or moderate dehydration may include thirst, dry lips and mouth, and dizziness. Symptoms of severe dehydration may include increased heart rate, confusion, fainting, and not urinating. For severe dehydration, you may need to get fluids through an IV at the hospital. For mild or moderate dehydration, you can usually rehydrate at home by drinking certain fluids as told by your health care provider. What are the risks? Generally, rehydration is safe. However, taking in too much fluid (overhydration) can be a problem. This is rare. Overhydration can cause an electrolyte imbalance, kidney failure, or a decrease in salt (sodium) levels in the body. Supplies needed You will need an oral rehydration solution (ORS) if your health care provider tells you to use one. This is a drink to treat dehydration. It can be found in pharmacies and retail stores. How to rehydrate Fluids Follow instructions from your health care provider for rehydration. The kind of fluid and the amount you should drink depend on your condition. In general, you should choose drinks that you prefer. If told by your health care provider, drink an ORS. Make an ORS by following instructions on the package. Start by drinking small amounts, about  cup (120  mL) every 5-10 minutes. Slowly increase how much you drink until you have taken the amount recommended by your health care provider. Drink enough clear fluids to keep your urine pale yellow. If you were told to drink an ORS, finish it first, then start slowly drinking other clear fluids. Drink fluids such as: Water. This includes sparkling water and flavored water. Drinking only water can lead to having too little sodium in your body (hyponatremia). Follow the advice of your health care provider. Water from ice chips you suck on. Fruit juice with water you add to it (diluted). Sports drinks. Hot or cold herbal teas. Broth-based soups. Milk or milk products. Food Follow instructions from your health care provider about what to eat while you rehydrate. Your health care provider may recommend that you slowly begin eating regular foods in small amounts. Eat foods that contain a healthy balance of electrolytes, such as bananas, oranges, potatoes, tomatoes, and spinach. Avoid foods that are greasy or contain a lot of sugar. In some cases, you may get nutrition through a feeding tube that is passed through your nose and into your stomach (nasogastric tube, or NG tube). This may be done if you have uncontrolled vomiting or diarrhea. Beverages to avoid  Certain beverages may make dehydration worse. While you rehydrate, avoid drinking alcohol. How to tell if you are recovering from dehydration You may be recovering from dehydration if: You are urinating more often than before you started rehydrating. Your urine is pale yellow. Your energy level improves. You vomit less frequently. You have diarrhea less frequently. Your appetite improves or returns to normal. You feel less dizzy or less light-headed.   Your skin tone and color start to look more normal. Follow these instructions at home: Take over-the-counter and prescription medicines only as told by your health care provider. Do not take sodium  tablets. Doing this can lead to having too much sodium in your body (hypernatremia). Contact a health care provider if: You continue to have symptoms of mild or moderate dehydration, such as: Thirst. Dry lips. Slightly dry mouth. Dizziness. Dark urine or less urine than normal. Muscle cramps. You continue to vomit or have diarrhea. Get help right away if you: Have symptoms of dehydration that get worse. Have a fever. Have a severe headache. Have been vomiting and the following happens: Your vomiting gets worse or does not go away. Your vomit includes blood or green matter (bile). You cannot eat or drink without vomiting. Have problems with urination or bowel movements, such as: Diarrhea that gets worse or does not go away. Blood in your stool (feces). This may cause stool to look black and tarry. Not urinating, or urinating only a small amount of very dark urine, within 6-8 hours. Have trouble breathing. Have symptoms that get worse with treatment. These symptoms may represent a serious problem that is an emergency. Do not wait to see if the symptoms will go away. Get medical help right away. Call your local emergency services (911 in the [place] Do not drive yourself to the hospital. Summary Rehydration is the replacement of body fluids and minerals (electrolytes) that are lost during dehydration. Follow instructions from your health care provider for rehydration. The kind of fluid and amount you should drink depend on your condition. Slowly increase how much you drink until you have taken the amount recommended by your health care provider. Contact your health care provider if you continue to show signs of mild or moderate dehydration. This information is not intended to replace advice given to you by your health care provider. Make sure you discuss any questions you have with your health care provider. Document Revised: 05/18/2019 Document Reviewed: 03/28/2019 Elsevier Patient  Education  2023 [employer]  

## 2021-08-07 ENCOUNTER — Telehealth: Payer: Self-pay | Admitting: Cardiology

## 2021-08-07 NOTE — Telephone Encounter (Signed)
Pt c/o BP issue: STAT if pt c/o blurred vision, one-sided weakness or slurred speech ? ?1. What are your last 5 BP readings?  ?08/06/21 98/50 ?08/07/21 96/52 & 98/58 ? ?2. Are you having any other symptoms (ex. Dizziness, headache, blurred vision, passed out)? No  ? ?3. What is your BP issue? Patient is having hypotension. Is on oxygen and just got off prednisone. States he was having occasional hot flashes while on prednisone. BP was low last week in the hospital with pneumonia and he had to get a blood transfusion. Patient's hospital f/u was rescheduled for the first available at [facility], 05/22 with Caitlin. BP reading from today was taken by home health nurse, second reading was taken by wife to compare difference in her readings vs nurses. Yesterday's BP reading was from an appointment with the cancer Dr.  ?

## 2021-08-07 NOTE — Telephone Encounter (Signed)
RN returned call to patient's wife (ok per DPR) to discuss patient having low blood pressure. Wife says that he always has low blood pressure but it is worse than usual. She states that he was supposed to have chemo yesterday but was not due to low BP, patient was given fluid and it came up from 77/49 to 98/50 and was told that he was just dehydrated. Pressures today 96/52, 98/58. Patient is not symptomatic and is still up and walking around.  ? ? ?Advised patient's wife that if he becomes symptomatic to take him to the ED.  ? ?Routing to Dr. Harrell Gave and Isac Caddy for advisement  ?

## 2021-08-08 NOTE — Telephone Encounter (Signed)
He is not on any blood pressure lowering medications. The tamsulosin can cause some low BP on occasion but not usually that low. If it came up with hydration, that does support dehydration. I would aim to stay hydrated and eat/drink as best he can, but if he has persistent lightheadedness or passes out, he should come to the ER.

## 2021-08-09 ENCOUNTER — Encounter: Payer: Self-pay | Admitting: Family Medicine

## 2021-08-09 ENCOUNTER — Ambulatory Visit (INDEPENDENT_AMBULATORY_CARE_PROVIDER_SITE_OTHER): Payer: Medicare Other | Admitting: Family Medicine

## 2021-08-09 DIAGNOSIS — I10 Essential (primary) hypertension: Secondary | ICD-10-CM

## 2021-08-09 DIAGNOSIS — J189 Pneumonia, unspecified organism: Secondary | ICD-10-CM

## 2021-08-09 DIAGNOSIS — D61818 Other pancytopenia: Secondary | ICD-10-CM

## 2021-08-09 NOTE — Patient Instructions (Signed)
Continue to monitor BP at home.  ?Return for nurse visit next week for BP ?See me again in 6 weeks.  ?

## 2021-08-09 NOTE — Telephone Encounter (Signed)
RN returning call to patient and wife to discuss the following recommendations provided by Dr. Harrell Gave. Patient's wife states he has not been dizzy.  ? ?5/11- 88/61 ? ?On Flomax due to needing a catheter last time he was in the hospital. Advised patient to call the doctor that prescribes this to see if dosage needs to be adjusted. They state they have an appointment with PCP today. They will follow up with Dr. Harrell Gave on 5/22.  ? ? ? ? ?"He is not on any blood pressure lowering medications. The tamsulosin can cause some low BP on occasion but not usually that low. If it came up with hydration, that does support dehydration. I would aim to stay hydrated and eat/drink as best he can, but if he has persistent lightheadedness or passes out, he should come to the ER." ?

## 2021-08-11 NOTE — Progress Notes (Signed)
?Gerald [name] - 81 y.o. male MRN [id number]  Date of birth: 1940/09/16 ? ?Subjective ?Chief Complaint  ?Patient presents with  ? Hospitalization Follow-up  ? ? ?HPI ?Gerald [name] is an 81 year old male here today for hospital follow-up.  Recently hospitalized due to pneumonia.  Admission was from 4/28 to 07/31/2021.  Admitted with neutropenic fever and multifocal pneumonia.  Started on fluids and antibiotics.  Blood cultures with no growth.  Transition to oral cefdinir prior to discharge to complete a 7-day course.  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  Seeing oncology and having chemo every 3 weeks.  He was able to get his infusion this week as his blood pressures were low.  Blood pressure is low here today as well however he denies any symptoms including chest pain, shortness of breath or dizziness.  His wife reports he is sleepy throughout the day but is easy to arouse. ? ?ROS:  A comprehensive ROS was completed and negative except as noted per HPI ? ? ? ?Allergies  ?Allergen Reactions  ? Cosela [Trilaciclib] Other (See Comments)  ?  thrombophlebitis  ? ? ?Past Medical History:  ?Diagnosis Date  ? Bradycardia   ? Bursitis   ? Carotid artery disease ([place])   ? COPD (chronic obstructive pulmonary disease) ([place])   ? Coronary artery disease   ? CTO mid-dist CX (fills from left-to-left collaterals), 99% OM2, 30% ost-dist LM, 30% prox-mid LAD, 30% ost-mid CX, 99% prox RCA (too small for PCI), medical management 02/09/20  ? Hypercholesterolemia   ? Ischemic cardiomyopathy   ? Lung cancer ([place])   ? Lung nodule   ? nscl ca dx'd 08/2019  ? Psoriasis   ? Tobacco abuse   ? Wears glasses   ? reading  ? ? ?Past Surgical History:  ?Procedure Laterality Date  ? BRONCHIAL BIOPSY  09/27/2019  ? Procedure: BRONCHIAL BIOPSIES;  Surgeon: Garner Nash, DO;  Location: [place];  Service: Pulmonary;;  ? BRONCHIAL BRUSHINGS  09/27/2019  ? Procedure: BRONCHIAL BRUSHINGS;  Surgeon: Garner Nash, DO;  Location: [place];   Service: Pulmonary;;  ? BRONCHIAL NEEDLE ASPIRATION BIOPSY  09/27/2019  ? Procedure: BRONCHIAL NEEDLE ASPIRATION BIOPSIES;  Surgeon: Garner Nash, DO;  Location: [place];  Service: Pulmonary;;  ? BRONCHIAL NEEDLE ASPIRATION BIOPSY  03/29/2021  ? Procedure: BRONCHIAL NEEDLE ASPIRATION BIOPSIES;  Surgeon: Garner Nash, DO;  Location: [place];  Service: Pulmonary;;  ? BRONCHIAL WASHINGS  09/27/2019  ? Procedure: BRONCHIAL WASHINGS;  Surgeon: Garner Nash, DO;  Location: [place] [facility];  Service: Pulmonary;;  ? BRONCHIAL WASHINGS  03/29/2021  ? Procedure: BRONCHIAL WASHINGS;  Surgeon: Garner Nash, DO;  Location: [place] [facility];  Service: Pulmonary;;  ? COLONOSCOPY    ? CRYOTHERAPY  09/27/2019  ? Procedure: CRYOTHERAPY;  Surgeon: Garner Nash, DO;  Location: [place] [facility];  Service: Pulmonary;;  ? CRYOTHERAPY  03/29/2021  ? Procedure: CRYOTHERAPY;  Surgeon: Garner Nash, DO;  Location: [place] [facility];  Service: Pulmonary;;  ? FIDUCIAL MARKER PLACEMENT  09/27/2019  ? Procedure: FIDUCIAL MARKER PLACEMENT;  Surgeon: Garner Nash, DO;  Location: [place] [facility];  Service: Pulmonary;;  ? HEMOSTASIS CONTROL  09/27/2019  ? Procedure: HEMOSTASIS CONTROL;  Surgeon: Garner Nash, DO;  Location: [place] [facility];  Service: Pulmonary;;  ? HERNIA REPAIR    ? umbicial- no longer as a "belly button"  ? IR IMAGING GUIDED PORT INSERTION  05/21/2021  ? IR US GUIDE VASC ACCESS LEFT  05/21/2021  ? RIGHT/LEFT  HEART CATH AND CORONARY ANGIOGRAPHY N/A 02/09/2020  ? Procedure: RIGHT/LEFT HEART CATH AND CORONARY ANGIOGRAPHY;  Surgeon: Burnell Blanks, MD;  Location: [place] [facility];  Service: Cardiovascular;  Laterality: N/A;  ? VIDEO BRONCHOSCOPY WITH ENDOBRONCHIAL NAVIGATION N/A 09/27/2019  ? Procedure: VIDEO BRONCHOSCOPY WITH ENDOBRONCHIAL NAVIGATION;  Surgeon: Garner Nash, DO;  Location: [place];  Service: Pulmonary;  Laterality: N/A;  ? VIDEO BRONCHOSCOPY WITH ENDOBRONCHIAL ULTRASOUND N/A  03/29/2021  ? Procedure: VIDEO BRONCHOSCOPY WITH ENDOBRONCHIAL ULTRASOUND;  Surgeon: Garner Nash, DO;  Location: [place];  Service: Pulmonary;  Laterality: N/A;  ? ? ?Social History  ? ?Socioeconomic History  ? Marital status: Married  ?  Spouse name: Not on file  ? Number of children: Not on file  ? Years of education: Not on file  ? Highest education level: Not on file  ?Occupational History  ? Not on file  ?Tobacco Use  ? Smoking status: Every Day  ?  Packs/day: 2.00  ?  Years: 62.00  ?  Pack years: 124.00  ?  Types: Cigarettes  ?  Passive exposure: Past  ? Smokeless tobacco: Never  ? Tobacco comments:  ?  Pt currently using nicotine patch.  ?Vaping Use  ? Vaping Use: Never used  ?Substance and Sexual Activity  ? Alcohol use: Not Currently  ?  Comment: rare  ? Drug use: Never  ? Sexual activity: Not Currently  ?Other Topics Concern  ? Not on file  ?Social History Narrative  ? Not on file  ? ?Social Determinants of Health  ? ?Financial Resource Strain: Not on file  ?Food Insecurity: Not on file  ?Transportation Needs: Not on file  ?Physical Activity: Not on file  ?Stress: Not on file  ?Social Connections: Not on file  ? ? ?Family History  ?Problem Relation Age of Onset  ? Heart attack Father   ? Mesothelioma Sister 65  ? Lung cancer Brother   ? ? ?Health Maintenance  ?Topic Date Due  ? COVID-19 Vaccine (3 - Moderna risk series) 06/22/2019  ? Zoster Vaccines- Shingrix (1 of 2) 11/29/2021 (Originally 07/24/1959)  ? Pneumonia Vaccine 48+ Years old (2 - PPSV23 if available, else PCV20) 07/25/2022 (Originally 03/06/2016)  ? TETANUS/TDAP  07/25/2022 (Originally 07/24/1959)  ? INFLUENZA VACCINE  10/29/2021  ? HPV VACCINES  Aged Out  ? ? ? ?----------------------------------------------------------------------------------------------------------------------------------------------------------------------------------------------------------------- ?Physical Exam ?BP 106/65 (BP Location: Left Arm, Patient Position:  Sitting, Cuff Size: Normal)   Pulse 94   Ht 6' (1.829 m)   Wt 206 lb (93.4 kg)   SpO2 95%   BMI 27.94 kg/m?  ? ?Physical Exam ?Constitutional:   ?   Appearance: Normal appearance.  ?Eyes:  ?   General: No scleral icterus. ?Cardiovascular:  ?   Rate and Rhythm: Normal rate and regular rhythm.  ?Pulmonary:  ?   Effort: Pulmonary effort is normal.  ?   Breath sounds: Normal breath sounds.  ?Musculoskeletal:  ?   Cervical back: Neck supple.  ?Neurological:  ?   Mental Status: He is alert.  ?Psychiatric:     ?   Mood and Affect: Mood normal.     ?   Behavior: Behavior normal.  ? ? ?------------------------------------------------------------------------------------------------------------------------------------------------------------------------------------------------------------------- ?Assessment and Plan ? ?Multifocal pneumonia ?Stable at this time.  Recently completed course of antibiotics.  Has continued metastatic cancer would be contributing to these recurrent episodes as well.  Instructed to contact clinic if having new or worsening symptoms.  We will plan to follow-up in 4  to 6 weeks. ? ?Pancytopenia ([place]) ?Recent labs reviewed with continued pancytopenia.  Overall this is stable. ? ?HTN (hypertension) ?History of hypertension now with hypotension.  Blood pressure better today compared to previous readings.  He is relatively asymptomatic. ? ? ?No orders of the defined types were placed in this encounter. ? ? ?Return in about 6 months (around 02/09/2022) for Pneumonia/BP. ? ? ? ?This visit occurred during the SARS-CoV-2 public health emergency.  Safety protocols were in place, including screening questions prior to the visit, additional usage of staff PPE, and extensive cleaning of exam room while observing appropriate contact time as indicated for disinfecting solutions.  ? ?

## 2021-08-11 NOTE — Assessment & Plan Note (Signed)
History of hypertension now with hypotension.  Blood pressure better today compared to previous readings.  He is relatively asymptomatic. ?

## 2021-08-11 NOTE — Assessment & Plan Note (Signed)
Stable at this time.  Recently completed course of antibiotics.  Has continued metastatic cancer would be contributing to these recurrent episodes as well.  Instructed to contact clinic if having new or worsening symptoms.  We will plan to follow-up in 4 to 6 weeks. ?

## 2021-08-11 NOTE — Assessment & Plan Note (Signed)
Recent labs reviewed with continued pancytopenia.  Overall this is stable. ?

## 2021-08-12 NOTE — Progress Notes (Signed)
[facility] ?OFFICE PROGRESS NOTE ? ?[name], DO ?[address] ?[place] [address] ? ?DIAGNOSIS:   Extensive stage (T2 a, N2, M1b) small cell lung cancer that was initially diagnosed as synchronous stage Ia non-small cell lung cancer, squamous cell carcinoma involving right upper lobe pulmonary nodule in addition to right lower lobe endobronchial lesion diagnosed in June 2021.  The patient has evidence for disease recurrence and metastasis in December 2022 ?  ?PRIOR THERAPY: ?1) SBRT to the right upper lobe pulmonary nodule as well as the right lower lobe pulmonary nodule under the care of Dr. Sondra Come completed on 11/14/2019. ?2) Palliative systemic chemotherapy with carboplatin for an AUC of 5, etoposide 100 mg/m2, and Imfinzi 1500 mg IV every 3 weeks with Cosela.  First dose expected on 04/16/21.  Status post 4 cycles. ? ?CURRENT THERAPY: Zepzelca (lurbinectedin) 3.2 Mg/M2 every 3 weeks.  First dose July 16, 2021.  Status post 1 cycle. Dose reduced to 2.6 starging starting from cycle #2 due to pancytopenia with cycle #1.  ? ?INTERVAL HISTORY: ?Gerald [name] 81 y.o. male returns to the clinic today for a follow up visit accompanied by his wife and daughter. The patient was recently found to have evidence of disease progression on imaging studies.  Therefore, his treatment with his prior chemotherapy was discontinued and switched to [place].  The patient received his first dose of treatment on 07/16/2021.  On 07/26/2021, the patient presented to the emergency room with neutropenia, generalized weakness, fever, and shortness of breath.  He was treated for neutropenic fever, possible multifocal pneumonia, and sepsis. He had a CTA on 07/26/21. The patient received Granix inpatient. He also had a blood transfusion.  He completed his outpatient course of antibiotics. He is also currently undergoing physical therapy at home 2x per week.   ? ?He was seen then last week on 5/9.  While his pneumonia symptoms improved, he was still feeling weak.  He also was hypotensive and received IVF last week. His treatment was delayed by 1 week to allow more time to recover from his hospitalization. He has been monitoring his blood pressure closely at home. He is asymptomatic and denies dizziness, syncope, or lightheadedness.  ? ?He thinks the extra week to recover helped. He reports his weakness is improved compared to last week. His shortness of breath continues to improve compared to when he was hospitalized. He reports he may have a mild cough every once in while but "not a lot". He has not needed to use supplemental oxygen as much recently. He ran out of his nicotine patch a few days ago. Denies chest pain. He denies nausea, vomiting, diarrhea, or constipation. Denies any headache or visual changes.  The patient is here today in for repeat blood work before considering starting cycle #2.  ? ? ? ?MEDICAL HISTORY: ?Past Medical History:  ?Diagnosis Date  ? Bradycardia   ? Bursitis   ? Carotid artery disease ([facility])   ? COPD (chronic obstructive pulmonary disease) ([facility])   ? Coronary artery disease   ? CTO mid-dist CX (fills from left-to-left collaterals), 99% OM2, 30% ost-dist LM, 30% prox-mid LAD, 30% ost-mid CX, 99% prox RCA (too small for PCI), medical management 02/09/20  ? Hypercholesterolemia   ? Ischemic cardiomyopathy   ? Lung cancer ([facility])   ? Lung nodule   ? nscl ca dx'd 08/2019  ? Psoriasis   ? Tobacco abuse   ? Wears glasses   ?  reading  ? ? ?ALLERGIES:  is allergic to cosela [trilaciclib]. ? ?MEDICATIONS:  ?Current Outpatient Medications  ?Medication Sig Dispense Refill  ? albuterol (VENTOLIN HFA) 108 (90 Base) MCG/ACT inhaler Inhale 2 puffs into the lungs every 6 (six) hours as needed for wheezing or shortness of breath. 18 g 2  ? AMBULATORY NON FORMULARY MEDICATION Please provide 4 wheel rolling walker with seat for [employer].  Diagnosis: R26.81 Gait instability 1 Device 0  ? aspirin EC  81 MG tablet Take 1 tablet (81 mg total) by mouth daily. Swallow whole. 90 tablet 3  ? clobetasol cream (TEMOVATE) 1.09 % Apply 1 application topically daily as needed (Psoriasis).    ? Fluticasone-Umeclidin-Vilant (TRELEGY ELLIPTA) 100-62.5-25 MCG/ACT AEPB Inhale 1 puff into the lungs daily. 60 each 11  ? hydrocortisone 1 % ointment Apply 1 application. topically 2 (two) times daily. To the rash. Please follow-up with your dermatologist. 30 g 0  ? lidocaine-prilocaine (EMLA) cream Apply to the Port-A-Cath site 30-60-minute before chemotherapy. 30 g 0  ? Multiple Vitamins-Minerals (PRESERVISION AREDS 2 PO) Take 1 tablet by mouth in the morning and at bedtime.    ? niacinamide 500 MG tablet Take 500 mg by mouth daily with breakfast.    ? nicotine (NICODERM CQ - DOSED IN MG/24 HOURS) 14 mg/24hr patch Place 14 mg onto the skin daily.    ? prochlorperazine (COMPAZINE) 10 MG tablet Take 1 tablet (10 mg total) by mouth every 6 (six) hours as needed. 30 tablet 2  ? rosuvastatin (CRESTOR) 20 MG tablet Take 1 tablet (20 mg total) by mouth daily. 90 tablet 3  ? tamsulosin (FLOMAX) 0.4 MG CAPS capsule Take 0.4 mg by mouth.    ? triamcinolone cream (KENALOG) 0.1 % Apply 1 application topically daily as needed (skin irritation.).    ? vitamin B-12 (CYANOCOBALAMIN) 500 MCG tablet Take 500 mcg by mouth every evening.    ? ?No current facility-administered medications for this visit.  ? ? ?SURGICAL HISTORY:  ?Past Surgical History:  ?Procedure Laterality Date  ? BRONCHIAL BIOPSY  09/27/2019  ? Procedure: BRONCHIAL BIOPSIES;  Surgeon: Garner Nash, DO;  Location: [facility];  Service: Pulmonary;;  ? BRONCHIAL BRUSHINGS  09/27/2019  ? Procedure: BRONCHIAL BRUSHINGS;  Surgeon: Garner Nash, DO;  Location: [facility];  Service: Pulmonary;;  ? BRONCHIAL NEEDLE ASPIRATION BIOPSY  09/27/2019  ? Procedure: BRONCHIAL NEEDLE ASPIRATION BIOPSIES;  Surgeon: Garner Nash, DO;  Location: [facility];  Service: Pulmonary;;  ?  BRONCHIAL NEEDLE ASPIRATION BIOPSY  03/29/2021  ? Procedure: BRONCHIAL NEEDLE ASPIRATION BIOPSIES;  Surgeon: Garner Nash, DO;  Location: [facility];  Service: Pulmonary;;  ? BRONCHIAL WASHINGS  09/27/2019  ? Procedure: BRONCHIAL WASHINGS;  Surgeon: Garner Nash, DO;  Location: [facility];  Service: Pulmonary;;  ? BRONCHIAL WASHINGS  03/29/2021  ? Procedure: BRONCHIAL WASHINGS;  Surgeon: Garner Nash, DO;  Location: [facility];  Service: Pulmonary;;  ? COLONOSCOPY    ? CRYOTHERAPY  09/27/2019  ? Procedure: CRYOTHERAPY;  Surgeon: Garner Nash, DO;  Location: [facility];  Service: Pulmonary;;  ? CRYOTHERAPY  03/29/2021  ? Procedure: CRYOTHERAPY;  Surgeon: Garner Nash, DO;  Location: [facility];  Service: Pulmonary;;  ? FIDUCIAL MARKER PLACEMENT  09/27/2019  ? Procedure: FIDUCIAL MARKER PLACEMENT;  Surgeon: Garner Nash, DO;  Location: [facility];  Service: Pulmonary;;  ? HEMOSTASIS CONTROL  09/27/2019  ? Procedure: HEMOSTASIS CONTROL;  Surgeon: Garner Nash, DO;  Location: [facility];  Service: Pulmonary;;  ? HERNIA REPAIR    ? umbicial- no longer as a "belly button"  ? IR IMAGING GUIDED PORT INSERTION  05/21/2021  ? IR US GUIDE VASC ACCESS LEFT  05/21/2021  ? RIGHT/LEFT HEART CATH AND CORONARY ANGIOGRAPHY N/A 02/09/2020  ? Procedure: RIGHT/LEFT HEART CATH AND CORONARY ANGIOGRAPHY;  Surgeon: Burnell Blanks, MD;  Location: [facility];  Service: Cardiovascular;  Laterality: N/A;  ? VIDEO BRONCHOSCOPY WITH ENDOBRONCHIAL NAVIGATION N/A 09/27/2019  ? Procedure: VIDEO BRONCHOSCOPY WITH ENDOBRONCHIAL NAVIGATION;  Surgeon: Garner Nash, DO;  Location: [facility];  Service: Pulmonary;  Laterality: N/A;  ? VIDEO BRONCHOSCOPY WITH ENDOBRONCHIAL ULTRASOUND N/A 03/29/2021  ? Procedure: VIDEO BRONCHOSCOPY WITH ENDOBRONCHIAL ULTRASOUND;  Surgeon: Garner Nash, DO;  Location: [facility];  Service: Pulmonary;  Laterality: N/A;  ? ? ?REVIEW OF SYSTEMS:   ?Review of  Systems  ?Constitutional: Negative for appetite change, chills, fatigue, fever and unexpected weight change.  ?HENT:   Negative for mouth sores, nosebleeds, sore throat and trouble swallowing.   ?Eyes: Negativ

## 2021-08-13 ENCOUNTER — Other Ambulatory Visit: Payer: Medicare Other

## 2021-08-13 MED FILL — Dexamethasone Sodium Phosphate Inj 100 MG/10ML: INTRAMUSCULAR | Qty: 1 | Status: AC

## 2021-08-14 ENCOUNTER — Other Ambulatory Visit: Payer: Self-pay

## 2021-08-14 ENCOUNTER — Inpatient Hospital Stay: Payer: Medicare Other | Admitting: Family Medicine

## 2021-08-14 ENCOUNTER — Inpatient Hospital Stay: Payer: Medicare Other

## 2021-08-14 ENCOUNTER — Inpatient Hospital Stay (HOSPITAL_BASED_OUTPATIENT_CLINIC_OR_DEPARTMENT_OTHER): Payer: Medicare Other | Admitting: Physician Assistant

## 2021-08-14 VITALS — BP 99/54 | HR 91 | Temp 97.6°F | Resp 18 | Ht 72.0 in | Wt 194.6 lb

## 2021-08-14 DIAGNOSIS — Z5111 Encounter for antineoplastic chemotherapy: Secondary | ICD-10-CM | POA: Diagnosis not present

## 2021-08-14 DIAGNOSIS — C3490 Malignant neoplasm of unspecified part of unspecified bronchus or lung: Secondary | ICD-10-CM

## 2021-08-14 DIAGNOSIS — I959 Hypotension, unspecified: Secondary | ICD-10-CM

## 2021-08-14 DIAGNOSIS — C3411 Malignant neoplasm of upper lobe, right bronchus or lung: Secondary | ICD-10-CM

## 2021-08-14 LAB — CBC WITH DIFFERENTIAL (CANCER CENTER ONLY)
Abs Immature Granulocytes: 0.03 10*3/uL (ref 0.00–0.07)
Basophils Absolute: 0.1 10*3/uL (ref 0.0–0.1)
Basophils Relative: 1 %
Eosinophils Absolute: 0 10*3/uL (ref 0.0–0.5)
Eosinophils Relative: 0 %
HCT: 34.3 % — ABNORMAL LOW (ref 39.0–52.0)
Hemoglobin: 11.1 g/dL — ABNORMAL LOW (ref 13.0–17.0)
Immature Granulocytes: 0 %
Lymphocytes Relative: 10 %
Lymphs Abs: 0.9 10*3/uL (ref 0.7–4.0)
MCH: 31.5 pg (ref 26.0–34.0)
MCHC: 32.4 g/dL (ref 30.0–36.0)
MCV: 97.4 fL (ref 80.0–100.0)
Monocytes Absolute: 1.1 10*3/uL — ABNORMAL HIGH (ref 0.1–1.0)
Monocytes Relative: 13 %
Neutro Abs: 6.4 10*3/uL (ref 1.7–7.7)
Neutrophils Relative %: 76 %
Platelet Count: 185 10*3/uL (ref 150–400)
RBC: 3.52 MIL/uL — ABNORMAL LOW (ref 4.22–5.81)
RDW: 17.8 % — ABNORMAL HIGH (ref 11.5–15.5)
WBC Count: 8.4 10*3/uL (ref 4.0–10.5)
nRBC: 0 % (ref 0.0–0.2)

## 2021-08-14 LAB — CMP (CANCER CENTER ONLY)
ALT: 14 U/L (ref 0–44)
AST: 14 U/L — ABNORMAL LOW (ref 15–41)
Albumin: 3.7 g/dL (ref 3.5–5.0)
Alkaline Phosphatase: 76 U/L (ref 38–126)
Anion gap: 5 (ref 5–15)
BUN: 16 mg/dL (ref 8–23)
CO2: 30 mmol/L (ref 22–32)
Calcium: 9.1 mg/dL (ref 8.9–10.3)
Chloride: 103 mmol/L (ref 98–111)
Creatinine: 0.87 mg/dL (ref 0.61–1.24)
GFR, Estimated: >60 mL/min (ref >60)
Glucose, Bld: 101 mg/dL — ABNORMAL HIGH (ref 70–99)
Potassium: 4.2 mmol/L (ref 3.5–5.1)
Sodium: 138 mmol/L (ref 135–145)
Total Bilirubin: 0.5 mg/dL (ref 0.3–1.2)
Total Protein: 6.5 g/dL (ref 6.5–8.1)

## 2021-08-14 MED ORDER — PALONOSETRON HCL INJECTION 0.25 MG/5ML
0.2500 mg | Freq: Once | INTRAVENOUS | Status: AC
  Administered 2021-08-14: 0.25 mg via INTRAVENOUS
  Filled 2021-08-14: qty 5

## 2021-08-14 MED ORDER — SODIUM CHLORIDE 0.9 % IV SOLN: Freq: Once | INTRAVENOUS | Status: AC

## 2021-08-14 MED ORDER — HEPARIN SOD (PORK) LOCK FLUSH 100 UNIT/ML IV SOLN
500.0000 [IU] | Freq: Once | INTRAVENOUS | Status: AC | PRN
  Administered 2021-08-14: 500 [IU]

## 2021-08-14 MED ORDER — NICOTINE 7 MG/24HR TD PT24: 7.0000 mg | MEDICATED_PATCH | Freq: Every day | TRANSDERMAL | 0 refills | Status: DC

## 2021-08-14 MED ORDER — SODIUM CHLORIDE 0.9 % IV SOLN
10.0000 mg | Freq: Once | INTRAVENOUS | Status: AC
  Administered 2021-08-14: 10 mg via INTRAVENOUS
  Filled 2021-08-14: qty 10

## 2021-08-14 MED ORDER — SODIUM CHLORIDE 0.9% FLUSH
10.0000 mL | INTRAVENOUS | Status: DC | PRN
  Administered 2021-08-14: 10 mL

## 2021-08-14 MED ORDER — SODIUM CHLORIDE 0.9 % IV SOLN
2.6000 mg/m2 | Freq: Once | INTRAVENOUS | Status: AC
  Administered 2021-08-14: 5.6 mg via INTRAVENOUS
  Filled 2021-08-14: qty 11.2

## 2021-08-14 NOTE — Patient Instructions (Signed)
[facility]  Discharge Instructions: ?Thank you for choosing [facility] to provide your oncology and hematology care.  ? ?If you have a lab appointment with the [facility], please go directly to the [facility] and check in at the registration area. ?  ?Wear comfortable clothing and clothing appropriate for easy access to any Portacath or PICC line.  ? ?We strive to give you quality time with your provider. You may need to reschedule your appointment if you arrive late (15 or more minutes).  Arriving late affects you and other patients whose appointments are after yours.  Also, if you miss three or more appointments without notifying the office, you may be dismissed from the clinic at the provider?s discretion.    ?  ?For prescription refill requests, have your pharmacy contact our office and allow 72 hours for refills to be completed.   ? ?Today you received the following chemotherapy and/or immunotherapy agents: Zepzelca    ?  ?To help prevent nausea and vomiting after your treatment, we encourage you to take your nausea medication as directed. ? ?BELOW ARE SYMPTOMS THAT SHOULD BE REPORTED IMMEDIATELY: ?*FEVER GREATER THAN 100.4 F (38 ?C) OR HIGHER ?*CHILLS OR SWEATING ?*NAUSEA AND VOMITING THAT IS NOT CONTROLLED WITH YOUR NAUSEA MEDICATION ?*UNUSUAL SHORTNESS OF BREATH ?*UNUSUAL BRUISING OR BLEEDING ?*URINARY PROBLEMS (pain or burning when urinating, or frequent urination) ?*BOWEL PROBLEMS (unusual diarrhea, constipation, pain near the anus) ?TENDERNESS IN MOUTH AND THROAT WITH OR WITHOUT PRESENCE OF ULCERS (sore throat, sores in mouth, or a toothache) ?UNUSUAL RASH, SWELLING OR PAIN  ?UNUSUAL VAGINAL DISCHARGE OR ITCHING  ? ?Items with * indicate a potential emergency and should be followed up as soon as possible or go to the Emergency Department if any problems should occur. ? ?Please show the CHEMOTHERAPY ALERT CARD or IMMUNOTHERAPY ALERT CARD at check-in to  the Emergency Department and triage nurse. ? ?Should you have questions after your visit or need to cancel or reschedule your appointment, please contact [facility]  Dept: [phone]  and follow the prompts.  Office hours are 8:00 a.m. to 4:30 p.m. Monday - Friday. Please note that voicemails left after 4:00 p.m. may not be returned until the following business day.  We are closed weekends and major holidays. You have access to a nurse at all times for urgent questions. Please call the main number to the clinic Dept: [phone] and follow the prompts. ? ? ?For any non-urgent questions, you may also contact your provider using MyChart. We now offer e-Visits for anyone 25 and older to request care online for non-urgent symptoms. For details visit [email or web address]. ?  ?Also download the MyChart app! Go to the app store, search "MyChart", open the app, select [facility], and log in with your MyChart username and password. ? ?Due to Covid, a mask is required upon entering the hospital/clinic. If you do not have a mask, one will be given to you upon arrival. For doctor visits, patients may have 1 support person aged 27 or older with them. For treatment visits, patients cannot have anyone with them due to current Covid guidelines and our immunocompromised population.  ?

## 2021-08-16 ENCOUNTER — Encounter (HOSPITAL_BASED_OUTPATIENT_CLINIC_OR_DEPARTMENT_OTHER): Payer: Self-pay | Admitting: Cardiology

## 2021-08-16 ENCOUNTER — Ambulatory Visit (INDEPENDENT_AMBULATORY_CARE_PROVIDER_SITE_OTHER): Payer: Medicare Other | Admitting: Cardiology

## 2021-08-16 VITALS — BP 102/64 | HR 97 | Ht 72.0 in | Wt 203.7 lb

## 2021-08-16 DIAGNOSIS — J431 Panlobular emphysema: Secondary | ICD-10-CM | POA: Diagnosis not present

## 2021-08-16 DIAGNOSIS — I255 Ischemic cardiomyopathy: Secondary | ICD-10-CM

## 2021-08-16 DIAGNOSIS — I959 Hypotension, unspecified: Secondary | ICD-10-CM | POA: Diagnosis not present

## 2021-08-16 DIAGNOSIS — I2510 Atherosclerotic heart disease of native coronary artery without angina pectoris: Secondary | ICD-10-CM

## 2021-08-16 DIAGNOSIS — I2584 Coronary atherosclerosis due to calcified coronary lesion: Secondary | ICD-10-CM

## 2021-08-16 DIAGNOSIS — C3490 Malignant neoplasm of unspecified part of unspecified bronchus or lung: Secondary | ICD-10-CM

## 2021-08-16 NOTE — Patient Instructions (Signed)
Medication Instructions:  Your Physician recommend you continue on your current medication as directed.    *If you need a refill on your cardiac medications before your next appointment, please call your pharmacy*   Lab Work: None ordered today   Testing/Procedures: None ordered today   Follow-Up: At [facility], you and your health needs are our priority.  As part of our continuing mission to provide you with exceptional heart care, we have created designated Provider Care Teams.  These Care Teams include your primary Cardiologist (physician) and Advanced Practice Providers (APPs -  Physician Assistants and Nurse Practitioners) who all work together to provide you with the care you need, when you need it.  We recommend signing up for the patient portal called "MyChart".  Sign up information is provided on this After Visit Summary.  MyChart is used to connect with patients for Virtual Visits (Telemedicine).  Patients are able to view lab/test results, encounter notes, upcoming appointments, etc.  Non-urgent messages can be sent to your provider as well.   To learn more about what you can do with MyChart, go to [email or web address].    Your next appointment:   3 month(s)  The format for your next appointment:   In Person  Provider:   Buford Dresser, MD{  I am worried less about what the blood pressure numbers are and more about how you feel. As long as there is no lightheadedness, passing out/blacking out, and overall you are feeling stable, I am ok with blood pressures in the 90s/50-60s. If you start to have symptoms, let me know and we may talk about midodrine (the double-edged sword medicine).  Stay hydrated in whatever way you can, and look into compression socks to see if they help.

## 2021-08-16 NOTE — Progress Notes (Signed)
Cardiology Office Note:    Date:  08/16/2021   ID:  Gerald [name], DOB [birth year], MRN [id number]  PCP:  [name], DO  Cardiologist:  Buford Dresser, MD  Referring MD: [name], DO   CC: Follow up  History of Present Illness:    Gerald [name] is a 81 y.o. 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 Julien Nordmann, who is seen for follow up. He was initially seen 01/26/20 as a new consult at the request of [name], DO for the evaluation and management of bradycardia.  From [facility]:  Echo dated 01/11/20 has an incomplete report in the scanned records. Per Care Everywhere, summary was a technically difficult study, no effusion, globally decreased EF (reported at 40%), no significant valve disease.  Note from 11/29/19 from Dr. Carlis Abbott notes ECG with second degree AV block, type 1. Pulse charted as 50 bpm. Planned for ambulatory monitor and echo.   Bio[employer] monitor result report scanned. Notes 6 days of use. Per report, most severe heart block was first degree AV block, slowest rate during this was 63 bpm. Did have PAC burden of 37%, PVC burden <1%, one episode of NSVT at 126 bpm (4 beats).   Cardiac management: bradycardia limits use of beta blocker and hypotension/lightheadedness limits ACEi/ARB/ARNI/MRA. He is taking aspirin 81 mg daily without issue. Tolerating rosuvastatin.  Today:   He is accompanied by his wife today. We reviewed recent phone call concern regarding low blood pressure.  His blood pressure is consistently low, usually in the 90's/50's. He denies any lightheadedness or syncope. For 5 days now he has not used a step 2 nicotine patch. Since then they have noticed his lower blood pressures. He denies lightheadedness/dizziness or syncope. They are concerned as home health has had to call in the blood pressure readings every day.   Additionally, he  endorses either whitish or yellowish sputum production. No fevers/chills.  He also complains of persistent coldness in all of his extremities.  Generally he has a healthy appetite. However, he does struggle with drinking enough water or Gatorade to stay hydrated. It is difficult for him to drink the entire recommended 64 oz of fluid daily.  He is also receiving hydration with his chemotherapy.  They confirm that his family history is notable for heart attacks.  He denies any palpitations, chest pain, or peripheral edema. No headaches, orthopnea, or PND.   Past Medical History:  Diagnosis Date   Bradycardia    Bursitis    Carotid artery disease (HCC)    COPD (chronic obstructive pulmonary disease) (HCC)    Coronary artery disease    CTO mid-dist CX (fills from left-to-left collaterals), 99% OM2, 30% ost-dist LM, 30% prox-mid LAD, 30% ost-mid CX, 99% prox RCA (too small for PCI), medical management 02/09/20   Hypercholesterolemia    Ischemic cardiomyopathy    Lung cancer ([place])    Lung nodule    nscl ca dx'd 08/2019   Psoriasis    Tobacco abuse    Wears glasses    reading    Past Surgical History:  Procedure Laterality Date   BRONCHIAL BIOPSY  09/27/2019   Procedure: BRONCHIAL BIOPSIES;  Surgeon: Garner Nash, DO;  Location: [place] [facility];  Service: Pulmonary;;   BRONCHIAL BRUSHINGS  09/27/2019   Procedure: BRONCHIAL BRUSHINGS;  Surgeon: Garner Nash, DO;  Location: [place] [facility];  Service: Pulmonary;;   BRONCHIAL  NEEDLE ASPIRATION BIOPSY  09/27/2019   Procedure: BRONCHIAL NEEDLE ASPIRATION BIOPSIES;  Surgeon: Garner Nash, DO;  Location: [place];  Service: Pulmonary;;   BRONCHIAL NEEDLE ASPIRATION BIOPSY  03/29/2021   Procedure: BRONCHIAL NEEDLE ASPIRATION BIOPSIES;  Surgeon: Garner Nash, DO;  Location: [place] [facility];  Service: Pulmonary;;   BRONCHIAL WASHINGS  09/27/2019   Procedure: BRONCHIAL WASHINGS;  Surgeon: Garner Nash, DO;  Location: [place];   Service: Pulmonary;;   BRONCHIAL WASHINGS  03/29/2021   Procedure: BRONCHIAL WASHINGS;  Surgeon: Garner Nash, DO;  Location: [place];  Service: Pulmonary;;   COLONOSCOPY     CRYOTHERAPY  09/27/2019   Procedure: Cydney Ok;  Surgeon: Garner Nash, DO;  Location: [place] [facility];  Service: Pulmonary;;   CRYOTHERAPY  03/29/2021   Procedure: CRYOTHERAPY;  Surgeon: Garner Nash, DO;  Location: [place] [facility];  Service: Pulmonary;;   FIDUCIAL MARKER PLACEMENT  09/27/2019   Procedure: FIDUCIAL MARKER PLACEMENT;  Surgeon: Garner Nash, DO;  Location: [place] [facility];  Service: Pulmonary;;   HEMOSTASIS CONTROL  09/27/2019   Procedure: HEMOSTASIS CONTROL;  Surgeon: Garner Nash, DO;  Location: [place];  Service: Pulmonary;;   HERNIA REPAIR     umbicial- no longer as a "belly button"   IR IMAGING GUIDED PORT INSERTION  05/21/2021   IR US GUIDE VASC ACCESS LEFT  05/21/2021   RIGHT/LEFT HEART CATH AND CORONARY ANGIOGRAPHY N/A 02/09/2020   Procedure: RIGHT/LEFT HEART CATH AND CORONARY ANGIOGRAPHY;  Surgeon: Burnell Blanks, MD;  Location: [place] [facility];  Service: Cardiovascular;  Laterality: N/A;   VIDEO BRONCHOSCOPY WITH ENDOBRONCHIAL NAVIGATION N/A 09/27/2019   Procedure: VIDEO BRONCHOSCOPY WITH ENDOBRONCHIAL NAVIGATION;  Surgeon: Garner Nash, DO;  Location: [place];  Service: Pulmonary;  Laterality: N/A;   VIDEO BRONCHOSCOPY WITH ENDOBRONCHIAL ULTRASOUND N/A 03/29/2021   Procedure: VIDEO BRONCHOSCOPY WITH ENDOBRONCHIAL ULTRASOUND;  Surgeon: Garner Nash, DO;  Location: [place];  Service: Pulmonary;  Laterality: N/A;    Current Medications: Current Outpatient Medications on File Prior to Visit  Medication Sig   albuterol (VENTOLIN HFA) 108 (90 Base) MCG/ACT inhaler Inhale 2 puffs into the lungs every 6 (six) hours as needed for wheezing or shortness of breath.   AMBULATORY NON FORMULARY MEDICATION Please provide 4 wheel rolling walker with seat  for [employer].  Diagnosis: R26.81 Gait instability   aspirin EC 81 MG tablet Take 1 tablet (81 mg total) by mouth daily. Swallow whole.   clobetasol cream (TEMOVATE) 0.93 % Apply 1 application topically daily as needed (Psoriasis).   Fluticasone-Umeclidin-Vilant (TRELEGY ELLIPTA) 100-62.5-25 MCG/ACT AEPB Inhale 1 puff into the lungs daily.   lidocaine-prilocaine (EMLA) cream Apply to the Port-A-Cath site 30-60-minute before chemotherapy.   Multiple Vitamins-Minerals (PRESERVISION AREDS 2 PO) Take 1 tablet by mouth in the morning and at bedtime.   niacinamide 500 MG tablet Take 500 mg by mouth daily with breakfast.   prochlorperazine (COMPAZINE) 10 MG tablet Take 1 tablet (10 mg total) by mouth every 6 (six) hours as needed.   rosuvastatin (CRESTOR) 20 MG tablet Take 1 tablet (20 mg total) by mouth daily.   tamsulosin (FLOMAX) 0.4 MG CAPS capsule Take 0.4 mg by mouth.   triamcinolone cream (KENALOG) 0.1 % Apply 1 application topically daily as needed (skin irritation.).   vitamin B-12 (CYANOCOBALAMIN) 500 MCG tablet Take 500 mcg by mouth every evening.   hydrocortisone 1 % ointment Apply 1 application. topically 2 (two) times daily. To the rash. Please follow-up with  your dermatologist. (Patient not taking: Reported on 08/16/2021)   nicotine (NICODERM CQ - DOSED IN MG/24 HR) 7 mg/24hr patch Place 1 patch (7 mg total) onto the skin daily. (Patient not taking: Reported on 08/16/2021)   No current facility-administered medications on file prior to visit.     Allergies:   Cosela [trilaciclib]   Social History   Tobacco Use   Smoking status: Every Day    Packs/day: 2.00    Years: 62.00    Pack years: 124.00    Types: Cigarettes    Passive exposure: Past   Smokeless tobacco: Never   Tobacco comments:    Pt currently using nicotine patch.  Vaping Use   Vaping Use: Never used  Substance Use Topics   Alcohol use: Not Currently    Comment: rare   Drug use: Never    Family  History: family history includes Heart attack in his father; Lung cancer in his brother; Mesothelioma (age of onset: 18) in his sister.  ROS:   Please see the history of present illness.   (+) Coldness of bilateral upper and lower extremities (+) Sputum production Additional pertinent ROS otherwise unremarkable.    EKGs/Labs/Other Studies Reviewed:    The following studies were reviewed today:  Echocardiogram 07/29/21:  Sonographer Comments: Image acquisition challenging due to respiratory  motion.   IMPRESSIONS    1. Left ventricular ejection fraction, by estimation, is 55 to 60%. The  left ventricle has normal function. The left ventricle has no regional  wall motion abnormalities. There is mild left ventricular hypertrophy.  Left ventricular diastolic function  could not be evaluated.   2. Right ventricular systolic function is normal. The right ventricular  size is normal.   3. The mitral valve is grossly normal. Trivial mitral valve  regurgitation. No evidence of mitral stenosis. Severe mitral annular  calcification.   4. The aortic valve was not well visualized. Aortic valve regurgitation  is not visualized. Mild aortic valve stenosis.   5. The inferior vena cava is normal in size with greater than 50%  respiratory variability, suggesting right atrial pressure of 3 mmHg.   Comparison(s): No prior Echocardiogram.   Conclusion(s)/Recommendation(s): Otherwise normal echocardiogram, with minor abnormalities described in the report.   DG Chest [facility] 07/30/21:   FINDINGS: LEFT-sided Port-A-Cath with tip projecting over SVC.   Enlargement of cardiac silhouette.   Prominent mediastinum accentuated by slight rotation to the RIGHT.   Fiduciary markers RIGHT lung.   Volume loss and atelectasis in the RIGHT chest especially lower lung with persistent pleural effusion.   LEFT lung clear.   No pneumothorax or acute osseous findings.   IMPRESSION: Volume loss in the  RIGHT hemithorax with increased atelectasis and probable slight increase in pleural effusion.   Fiduciary markers at prior RIGHT lung cancer.   Mild enlargement of cardiac silhouette.  CT Angio Chest Pulmonary Embolism 07/26/21:  IMPRESSION: 1. Marked severity right middle lobe and right lower lobe atelectasis and/or infiltrate, increased in severity when compared to the prior study. 2. Moderate severity multifocal right upper lobe infiltrates which represents a new finding. 3. Stable mass-like area along the anteromedial aspect of the right upper lobe with involvement of the anterior chest wall and anterior mediastinum. 4. Stable small right pleural effusion. 5. Moderate to marked severity central lobular emphysematous lung disease with stable right-sided volume loss. 6. Findings consistent with hepatic metastatic disease 7. Cholelithiasis.   Aortic Atherosclerosis (ICD10-I70.0) and Emphysema (ICD10-J43.9).  CT Chest 04/24/21:  IMPRESSION: 1. Today's study demonstrates definitive progression of disease with enlargement of the right upper lobe mass which again appears to invade the right chest wall and anterior mediastinum, worsening right hilar and mediastinal lymphadenopathy, enlarging thick-walled presumably malignant right pleural effusion, and interval development of what appears to be lymphangitic spread of tumor throughout the right lung, as detailed above. 2. Aortic atherosclerosis, in addition to left main and three-vessel coronary artery disease. 3. Cholelithiasis. 4. Colonic diverticulosis. 5. Additional incidental findings, as above.   Aortic Atherosclerosis (ICD10-I70.0).  Outside testing summarized in HPI  Cardiac cath 02/09/20 Mid Cx to Dist Cx lesion is 100% stenosed. 2nd Mrg lesion is 99% stenosed. Ost LM to Dist LM lesion is 30% stenosed. Prox LAD to Mid LAD lesion is 30% stenosed. Ost Cx to Mid Cx lesion is 30% stenosed. Prox RCA lesion is 99%  stenosed.   1. The LAD is a large caliber vessel that courses to the apex. Diffuse mild calcified disease without any focally obstructive lesions.  2. The Circumflex is a large dominant vessel. There is heavy calcification in the proximal and mid vessel. The mid Circumflex is chronically occluded just beyond a small caliber obtuse marginal branch. The left sided PDA fills from left to left collaterals.  3. The small non-dominant RCA has severe diffuse proximal and mid vessel disease. This non-dominant vessel is too small for PCI.  4. Normal right and left heart pressures   Recommendations: Medical management of CAD. His mid Circumflex is chronically occluded and fills briskly from left to left collaterals. The RCA is a small non-dominant vessel with severe diffuse proximal and mid disease. Could consider viability study to assess the lateral wall and if viable myocardium, review his films with the CTO team to consider PCI of the Circumflex. He seems to be asymptomatic at this time so the best approach may be medical management of his CAD without an attempt at PCI.   EKG:  EKG personally reviewed 08/16/21: not ordered 07/30/21: sinus tachycardia at 107 bpm with nonspecific T-wave abnormality 06/10/21: ECG was not ordered 12/06/20 SR/SA at 90 bpm with 1st degree AV block 01/26/20 SR with SA at 85 bpm  Recent Labs: 06/11/2021: TSH 1.311 07/30/2021: B Natriuretic Peptide 443.5; Magnesium 1.7 08/14/2021: ALT 14; BUN 16; Creatinine 0.87; Hemoglobin 11.1; Platelet Count 185; Potassium 4.2; Sodium 138   Recent Lipid Panel    Component Value Date/Time   CHOL 128 03/18/2021 0000   TRIG 179 (H) 03/18/2021 0000   HDL 41 03/18/2021 0000   CHOLHDL 3.1 03/18/2021 0000   LDLCALC 61 03/18/2021 0000    Physical Exam:    VS:  BP 102/64 (BP Location: Right Arm, Patient Position: Sitting, Cuff Size: Normal)   Pulse 97   Ht 6' (1.829 m)   Wt 203 lb 11.2 oz (92.4 kg)   BMI 27.63 kg/m     Wt Readings from Last  3 Encounters:  08/16/21 203 lb 11.2 oz (92.4 kg)  08/14/21 194 lb 9.6 oz (88.3 kg)  08/09/21 206 lb (93.4 kg)    GEN: In wheelchair, with O2 by nasal cannula in place HEENT: Normal, moist mucous membranes NECK: No JVD appreciated sitting upright CARDIAC: regular rhythm, normal S1 and S2, no rubs or gallops. 1/6 systolic murmur. VASCULAR: Radial and DP pulses 2+ bilaterally. No carotid bruits RESPIRATORY:  Distant breath sounds. On O2 by concentrator. No wheezing or rales appreciated. ABDOMEN: Soft, non-tender, non-distended MUSCULOSKELETAL:  Ambulates independently SKIN: Warm and dry, no edema NEUROLOGIC:  Alert and oriented x 3. No focal neuro deficits noted. PSYCHIATRIC:  Normal affect    ASSESSMENT:    1. Hypotension, unspecified hypotension type   2. Coronary artery disease due to calcified coronary lesion   3. Ischemic cardiomyopathy   4. Panlobular emphysema ([place])   5. Small cell lung cancer (HCC)     PLAN:    Hypotension, asymptomatic: -discussed at length today. He is on no antihypertensives. Only potential med interaction is tamsulosin, which is necessary to avoid urinary retention -he denies any symptoms -he is perfusing well, strong pulses, good cap refill -wonders if nicotine patch was helping keep his BP up. It may have played a role, but would not restart only for BP -we discussed importance of hydration. Also discussed compression stockings and activity as tolerated -we also discussed when we use medications like midodrine. Given his cardiac history, would avoid treating only the numbers. However, if he develops symptoms, may need to start midodrine to avoid injury  Coronary artery disease, with CTO and collaterals as above Ischemic cardiomyopathy Hypercholesterolemia, goal LDL <70 -tolerating aspirin, continue. If platelets severely low 2/2 chemo may need to be held temporarily, defer to onc team -tolerating rosuvastatin 20 mg daily -no beta blocker given  bradycardia history -no blood pressure room for ACEi/ARB/ARNI/MRA -asymptomatic -counseled on signs of heart failure, what to watch for, daily weights, etc  Bradycardia: based on monitor results, no high degree block. Avoid AV nodal agents. Asymptomatic  Extensive stage small cell carcinoma: on palliative chemotherapy under the care of Dr. Julien Nordmann.   Cardiac risk counseling and prevention recommendations: -recommend heart healthy/Mediterranean diet, with whole grains, fruits, vegetable, fish, lean meats, nuts, and olive oil. Limit salt. -recommend moderate walking, 3-5 times/week for 30-50 minutes each session. Aim for at least 150 minutes.week. Goal should be pace of 3 miles/hours, or walking 1.5 miles in 30 minutes -recommend avoidance of tobacco products. Avoid excess alcohol.  Plan for follow up: 3 months or sooner as needed  Overall I am concerned that his hypotension may progress to the point that he has symptoms. While midodrine is associated with worse outcomes in cardiovascular disease, if he begins to have limiting symptoms or especially falls, we might have no choice. Will try to manage conservatively for now with increased hydration, compression stockings, and gradual increase in activity level. They will contact me with any symptoms. Between Dr. Zigmund Daniel, Dr. Worthy Flank team, and myself, we will make sure that someone is monitoring him at least every few weeks.  High risk multiple comorbidities, high complexity medical decision making.  Buford Dresser, MD, PhD, [facility]   Medication Adjustments/Labs and Tests Ordered: Current medicines are reviewed at length with the patient today.  Concerns regarding medicines are outlined above.  No orders of the defined types were placed in this encounter.   No orders of the defined types were placed in this encounter.   I,Mathew Stumpf,acting as a Education administrator for [employer], MD.,have documented all  relevant documentation on the behalf of Buford Dresser, MD,as directed by  Buford Dresser, MD while in the presence of Buford Dresser, MD.   I, Buford Dresser, MD, have reviewed all documentation for this visit. The documentation on 08/16/21 for the exam, diagnosis, procedures, and orders are all accurate and complete.   Patient Instructions  Medication Instructions:  Your Physician recommend you continue on your current medication as directed.    *If you need a refill on your cardiac medications before your next  appointment, please call your pharmacy*   Lab Work: None ordered today   Testing/Procedures: None ordered today   Follow-Up: At [facility], you and your health needs are our priority.  As part of our continuing mission to provide you with exceptional heart care, we have created designated Provider Care Teams.  These Care Teams include your primary Cardiologist (physician) and Advanced Practice Providers (APPs -  Physician Assistants and Nurse Practitioners) who all work together to provide you with the care you need, when you need it.  We recommend signing up for the patient portal called "MyChart".  Sign up information is provided on this After Visit Summary.  MyChart is used to connect with patients for Virtual Visits (Telemedicine).  Patients are able to view lab/test results, encounter notes, upcoming appointments, etc.  Non-urgent messages can be sent to your provider as well.   To learn more about what you can do with MyChart, go to [email or web address].    Your next appointment:   3 month(s)  The format for your next appointment:   In Person  Provider:   Buford Dresser, MD{  I am worried less about what the blood pressure numbers are and more about how you feel. As long as there is no lightheadedness, passing out/blacking out, and overall you are feeling stable, I am ok with blood pressures in the 90s/50-60s. If you start to  have symptoms, let me know and we may talk about midodrine (the double-edged sword medicine).  Stay hydrated in whatever way you can, and look into compression socks to see if they help.  Signed, Buford Dresser, MD PhD 08/16/2021  [place]

## 2021-08-18 ENCOUNTER — Emergency Department (HOSPITAL_COMMUNITY)
Admission: EM | Admit: 2021-08-18 | Discharge: 2021-08-18 | Disposition: A | Discharge status: Home / Self Care | Payer: Medicare Other | Attending: Student | Admitting: Student

## 2021-08-18 ENCOUNTER — Other Ambulatory Visit: Payer: Self-pay

## 2021-08-18 ENCOUNTER — Emergency Department (HOSPITAL_COMMUNITY): Payer: Medicare Other

## 2021-08-18 ENCOUNTER — Encounter (HOSPITAL_COMMUNITY): Payer: Self-pay

## 2021-08-18 DIAGNOSIS — I2510 Atherosclerotic heart disease of native coronary artery without angina pectoris: Secondary | ICD-10-CM | POA: Diagnosis not present

## 2021-08-18 DIAGNOSIS — K6289 Other specified diseases of anus and rectum: Secondary | ICD-10-CM | POA: Diagnosis not present

## 2021-08-18 DIAGNOSIS — Z87891 Personal history of nicotine dependence: Secondary | ICD-10-CM | POA: Insufficient documentation

## 2021-08-18 DIAGNOSIS — I10 Essential (primary) hypertension: Secondary | ICD-10-CM | POA: Insufficient documentation

## 2021-08-18 DIAGNOSIS — Z7951 Long term (current) use of inhaled steroids: Secondary | ICD-10-CM | POA: Insufficient documentation

## 2021-08-18 DIAGNOSIS — R140 Abdominal distension (gaseous): Secondary | ICD-10-CM | POA: Diagnosis present

## 2021-08-18 DIAGNOSIS — J449 Chronic obstructive pulmonary disease, unspecified: Secondary | ICD-10-CM | POA: Diagnosis not present

## 2021-08-18 DIAGNOSIS — K5900 Constipation, unspecified: Secondary | ICD-10-CM | POA: Insufficient documentation

## 2021-08-18 DIAGNOSIS — Z7982 Long term (current) use of aspirin: Secondary | ICD-10-CM | POA: Diagnosis not present

## 2021-08-18 DIAGNOSIS — Z85118 Personal history of other malignant neoplasm of bronchus and lung: Secondary | ICD-10-CM | POA: Insufficient documentation

## 2021-08-18 DIAGNOSIS — Z79899 Other long term (current) drug therapy: Secondary | ICD-10-CM | POA: Insufficient documentation

## 2021-08-18 MED ORDER — POLYETHYLENE GLYCOL 3350 17 G PO PACK: 17.0000 g | PACK | Freq: Every day | ORAL | 0 refills | Status: DC

## 2021-08-18 MED ORDER — MIDAZOLAM HCL 2 MG/2ML IJ SOLN: 4.0000 mg | Freq: Once | INTRAMUSCULAR | Status: DC

## 2021-08-18 MED ORDER — LACTULOSE 10 GM/15ML PO SOLN
20.0000 g | Freq: Once | ORAL | Status: AC
Start: 2021-08-18 — End: 2021-08-18
  Administered 2021-08-18: 20 g via ORAL
  Filled 2021-08-18: qty 30

## 2021-08-18 MED ORDER — GLYCERIN (LAXATIVE) 2.1 G RE SUPP
1.0000 | Freq: Once | RECTAL | Status: AC
  Administered 2021-08-18: 1 via RECTAL
  Filled 2021-08-18: qty 1

## 2021-08-18 NOTE — ED Provider Notes (Signed)
[facility] DEPT Provider Note  CSN: [id number] Arrival date & time: 08/18/21 1234  Chief Complaint(s) Constipation  HPI Gerald [name] is a 81 y.o. male with PMH CAD, COPD, ischemic cardiomyopathy, lung cancer on active chemotherapy, psoriasis who presents emergency department for evaluation of constipation.  Patient was having healthy bowel movements when he was admitted to the hospital and on a bowel regimen but has since stopped using this bowel regimen and has not had a bowel movement in at least more than 4 days.  Patient endorses abdominal bloating and rectal pain, but denies nausea, vomiting, headache, fever or other systemic symptoms.  Patient is not on opioid pain medication at this time.   Past Medical History Past Medical History:  Diagnosis Date   Bradycardia    Bursitis    Carotid artery disease (HCC)    COPD (chronic obstructive pulmonary disease) (HCC)    Coronary artery disease    CTO mid-dist CX (fills from left-to-left collaterals), 99% OM2, 30% ost-dist LM, 30% prox-mid LAD, 30% ost-mid CX, 99% prox RCA (too small for PCI), medical management 02/09/20   Hypercholesterolemia    Ischemic cardiomyopathy    Lung cancer ([place])    Lung nodule    nscl ca dx'd 08/2019   Psoriasis    Tobacco abuse    Wears glasses    reading   Patient Active Problem List   Diagnosis Date Noted   COPD (chronic obstructive pulmonary disease) ([place]) 07/28/2021   HLD (hyperlipidemia) 07/27/2021   History of urinary retention 07/27/2021   HTN (hypertension) 07/24/2021   Constipation 07/24/2021   Port-A-Cath in place 05/28/2021   Gait instability 05/10/2021   Elevated LFTs 05/09/2021   Pancytopenia ([place]) 04/29/2021   Liver function abnormality 04/27/2021   Acute urinary retention 04/26/2021   Sepsis ([place]) 04/25/2021   Exudative pleural effusion 04/25/2021   AKI (acute kidney injury) ([place]) 04/25/2021   Ectropion 04/25/2021   Blepharitis 04/25/2021    Multifocal pneumonia 04/24/2021   Encounter for antineoplastic chemotherapy 04/22/2021   Encounter for antineoplastic immunotherapy 04/22/2021   Pressure ulcer, stage 2 ([place]) 04/21/2021   Small cell lung cancer ([place]) 04/10/2021   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   Adenopathy 03/29/2021   Endobronchial cancer, right ([place])    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 Authorizing Provider  albuterol (VENTOLIN HFA) 108 (90 Base) MCG/ACT inhaler Inhale 2 puffs into the lungs every 6 (six) hours as needed for wheezing or shortness of breath. 07/31/21  Yes Rai, Ripudeep K, MD  AMBULATORY NON FORMULARY MEDICATION Please provide 4 wheel rolling walker with seat for [name].  Diagnosis: R26.81 Gait instability 05/10/21  Yes [name], DO  aspirin EC 81 MG tablet Take 1 tablet (81 mg total) by mouth daily. Swallow whole. 01/26/20  Yes Buford Dresser, MD  clobetasol cream (TEMOVATE) 7.12 % Apply 1 application topically daily as needed (Psoriasis).  Yes [provider]  Fluticasone-Umeclidin-Vilant (TRELEGY ELLIPTA) 100-62.5-25 MCG/ACT AEPB Inhale 1 puff into the lungs daily. 06/20/21 09/18/21 Yes Icard, Octavio Graves, DO  lidocaine-prilocaine (EMLA) cream Apply to the Port-A-Cath site 30-60-minute before chemotherapy. 04/16/21  Yes Curt Bears, MD  Multiple  Vitamins-Minerals (PRESERVISION AREDS 2 PO) Take 1 tablet by mouth in the morning and at bedtime.   Yes [provider]  niacinamide 500 MG tablet Take 500 mg by mouth daily with breakfast.   Yes [provider]  nicotine (NICODERM CQ - DOSED IN MG/24 HR) 7 mg/24hr patch Place 1 patch (7 mg total) onto the skin daily. 08/14/21  Yes Heilingoetter, Cassandra L, PA-C  polyethylene glycol (MIRALAX) 17 g packet Take 17 g by mouth daily. 08/18/21  Yes Ric Rosenberg, MD  prochlorperazine (COMPAZINE) 10 MG tablet Take 1 tablet (10 mg total) by mouth every 6 (six) hours as needed. Patient taking differently: Take 10 mg by mouth every 6 (six) hours as needed for vomiting or nausea. 04/10/21  Yes Heilingoetter, Cassandra L, PA-C  rosuvastatin (CRESTOR) 20 MG tablet Take 1 tablet (20 mg total) by mouth daily. 06/10/21 06/05/22 Yes Buford Dresser, MD  tamsulosin (FLOMAX) 0.4 MG CAPS capsule Take 0.4 mg by mouth.   Yes [provider]  triamcinolone cream (KENALOG) 0.1 % Apply 1 application topically daily as needed (skin irritation.). 02/01/21  Yes [provider]  vitamin B-12 (CYANOCOBALAMIN) 500 MCG tablet Take 500 mcg by mouth every evening.   Yes [provider]  hydrocortisone 1 % ointment Apply 1 application. topically 2 (two) times daily. To the rash. Please follow-up with your dermatologist. Patient not taking: Reported on 08/16/2021 07/31/21   Mendel Corning, MD                                                                                                                                    Past Surgical History Past Surgical History:  Procedure Laterality Date   BRONCHIAL BIOPSY  09/27/2019   Procedure: BRONCHIAL BIOPSIES;  Surgeon: Garner Nash, DO;  Location: [facility];  Service: Pulmonary;;   BRONCHIAL BRUSHINGS  09/27/2019   Procedure: BRONCHIAL BRUSHINGS;  Surgeon: Garner Nash, DO;  Location: [facility];  Service: Pulmonary;;    BRONCHIAL NEEDLE ASPIRATION BIOPSY  09/27/2019   Procedure: BRONCHIAL NEEDLE ASPIRATION BIOPSIES;  Surgeon: Garner Nash, DO;  Location: [facility];  Service: Pulmonary;;   BRONCHIAL NEEDLE ASPIRATION BIOPSY  03/29/2021   Procedure: BRONCHIAL NEEDLE ASPIRATION BIOPSIES;  Surgeon: Garner Nash, DO;  Location: [facility];  Service: Pulmonary;;   BRONCHIAL WASHINGS  09/27/2019   Procedure: BRONCHIAL WASHINGS;  Surgeon: Garner Nash, DO;  Location: [facility];  Service: Pulmonary;;   BRONCHIAL WASHINGS  03/29/2021   Procedure: BRONCHIAL WASHINGS;  Surgeon: Garner Nash, DO;  Location: [facility];  Service: Pulmonary;;   COLONOSCOPY     CRYOTHERAPY  09/27/2019  Procedure: CRYOTHERAPY;  Surgeon: Garner Nash, DO;  Location: [facility];  Service: Pulmonary;;   CRYOTHERAPY  03/29/2021   Procedure: CRYOTHERAPY;  Surgeon: Garner Nash, DO;  Location: [facility];  Service: Pulmonary;;   FIDUCIAL MARKER PLACEMENT  09/27/2019   Procedure: FIDUCIAL MARKER PLACEMENT;  Surgeon: Garner Nash, DO;  Location: [facility];  Service: Pulmonary;;   HEMOSTASIS CONTROL  09/27/2019   Procedure: HEMOSTASIS CONTROL;  Surgeon: Garner Nash, DO;  Location: [facility];  Service: Pulmonary;;   HERNIA REPAIR     umbicial- no longer as a "belly button"   IR IMAGING GUIDED PORT INSERTION  05/21/2021   IR US GUIDE VASC ACCESS LEFT  05/21/2021   RIGHT/LEFT HEART CATH AND CORONARY ANGIOGRAPHY N/A 02/09/2020   Procedure: RIGHT/LEFT HEART CATH AND CORONARY ANGIOGRAPHY;  Surgeon: Burnell Blanks, MD;  Location: [facility];  Service: Cardiovascular;  Laterality: N/A;   VIDEO BRONCHOSCOPY WITH ENDOBRONCHIAL NAVIGATION N/A 09/27/2019   Procedure: VIDEO BRONCHOSCOPY WITH ENDOBRONCHIAL NAVIGATION;  Surgeon: Garner Nash, DO;  Location: [facility];  Service: Pulmonary;  Laterality: N/A;   VIDEO BRONCHOSCOPY WITH ENDOBRONCHIAL ULTRASOUND N/A 03/29/2021   Procedure:  VIDEO BRONCHOSCOPY WITH ENDOBRONCHIAL ULTRASOUND;  Surgeon: Garner Nash, DO;  Location: [facility];  Service: Pulmonary;  Laterality: N/A;   Family History Family History  Problem Relation Age of Onset   Heart attack Father    Mesothelioma Sister 29   Lung cancer Brother     Social History Social History   Tobacco Use   Smoking status: Former    Packs/day: 2.00    Years: 62.00    Pack years: 124.00    Types: Cigarettes    Passive exposure: Past   Smokeless tobacco: Never   Tobacco comments:    Pt currently using nicotine patch.  Vaping Use   Vaping Use: Never used  Substance Use Topics   Alcohol use: Not Currently    Comment: rare   Drug use: Never   Allergies Cosela [trilaciclib]  Review of Systems Review of Systems  Gastrointestinal:  Positive for abdominal pain and constipation.   Physical Exam Vital Signs  I have reviewed the triage vital signs BP 105/77   Pulse 90   Temp 97.7 F (36.5 C) (Oral)   Resp (!) 22   Ht 6' (1.829 m)   Wt 92.4 kg   SpO2 99%   BMI 27.63 kg/m   Physical Exam Constitutional:      General: He is not in acute distress.    Appearance: Normal appearance.  HENT:     Head: Normocephalic and atraumatic.     Nose: No congestion or rhinorrhea.  Eyes:     General:        Right eye: No discharge.        Left eye: No discharge.     Extraocular Movements: Extraocular movements intact.     Pupils: Pupils are equal, round, and reactive to light.  Cardiovascular:     Rate and Rhythm: Normal rate and regular rhythm.     Heart sounds: No murmur heard. Pulmonary:     Effort: No respiratory distress.     Breath sounds: No wheezing or rales.  Abdominal:     General: There is distension.     Tenderness: There is abdominal tenderness.  Musculoskeletal:        General: Normal range of motion.     Cervical back: Normal range of motion.  Skin:  General: Skin is warm and dry.  Neurological:     General: No focal deficit present.      Mental Status: He is alert.    ED Results and Treatments Labs (all labs ordered are listed, but only abnormal results are displayed) Labs Reviewed - No data to display                                                                                                                        Radiology DG Abdomen 1 View  Result Date: 08/18/2021 CLINICAL DATA:  Rectal pain, 3 days since last bowel movement. EXAM: ABDOMEN - 1 VIEW COMPARISON:  CT abdomen pelvis dated 07/04/2021. FINDINGS: There is a moderate amount of stool in the rectum. The bowel gas pattern is normal. Mild degenerative changes are seen in both hips. Moderate to severe degenerative changes are seen in the lumbar spine. IMPRESSION: Moderate amount of stool in the rectum. Electronically Signed   By: Zerita Boers M.D.   On: 08/18/2021 13:55    Pertinent labs & imaging results that were available during my care of the patient were reviewed by me and considered in my medical decision making (see MDM for details).  Medications Ordered in ED Medications  Glycerin (Adult) 2.1 g suppository 1 suppository (1 suppository Rectal Given 08/18/21 1446)  lactulose (CHRONULAC) 10 GM/15ML solution 20 g (20 g Oral Given 08/18/21 1336)                                                                                                                                     Procedures Procedures  (including critical care time)  Medical Decision Making / ED Course   This patient presents to the ED for concern of constipation, this involves an extensive number of treatment options, and is a complaint that carries with it a high risk of complications and morbidity.  The differential diagnosis includes drug-induced constipation, chemo-induced constipation, bowel obstruction, decreased p.o. intake  MDM: Patient seen in the emergency room for evaluation of constipation.  Physical exam reveals abdominal distention and generalized abdominal tenderness.   X-ray of the abdomen showing moderate stool burden.  Patient given lactulose and a glycerin suppository and unfortunately we were unsuccessful in inducing a bowel movement.  Patient then received a soapsuds enema with a successful bowel movement.  As patient is currently not on a bowel regimen, we will be fairly aggressive  and start the patient on a daily MiraLAX regimen.  He is at high risk for dehydration and patient and his wife were instructed to monitor closely for diarrhea and stop the MiraLAX if this occurs.  Patient will follow-up with his outpatient PCP and oncologist.  Patient likely would benefit from a Doc senna regimen after a current bowel cleanout   Additional history obtained: -Additional history obtained from wife -External records from outside source obtained and reviewed including: Chart review including previous notes, labs, imaging, consultation notes   Lab Tests: -I ordered, reviewed, and interpreted labs.   The pertinent results include:   Labs Reviewed - No data to display       Imaging Studies ordered: I ordered imaging studies including KUB I independently visualized and interpreted imaging. I agree with the radiologist interpretation   Medicines ordered and prescription drug management: Meds ordered this encounter  Medications   Glycerin (Adult) 2.1 g suppository 1 suppository   lactulose (CHRONULAC) 10 GM/15ML solution 20 g   DISCONTD: midazolam (VERSED) injection 4 mg   polyethylene glycol (MIRALAX) 17 g packet    Sig: Take 17 g by mouth daily.    Dispense:  14 each    Refill:  0    -I have reviewed the patients home medicines and have made adjustments as needed  Critical interventions none  Cardiac Monitoring: The patient was maintained on a cardiac monitor.  I personally viewed and interpreted the cardiac monitored which showed an underlying rhythm of: NSR  Social Determinants of Health:  Factors impacting patients care include:  none   Reevaluation: After the interventions noted above, I reevaluated the patient and found that they have :improved  Co morbidities that complicate the patient evaluation  Past Medical History:  Diagnosis Date   Bradycardia    Bursitis    Carotid artery disease (HCC)    COPD (chronic obstructive pulmonary disease) ([place])    Coronary artery disease    CTO mid-dist CX (fills from left-to-left collaterals), 99% OM2, 30% ost-dist LM, 30% prox-mid LAD, 30% ost-mid CX, 99% prox RCA (too small for PCI), medical management 02/09/20   Hypercholesterolemia    Ischemic cardiomyopathy    Lung cancer ([place])    Lung nodule    nscl ca dx'd 08/2019   Psoriasis    Tobacco abuse    Wears glasses    reading      Dispostion: I considered admission for this patient, but given successful bowel movement here in the emergency department he is safe for discharge with outpatient bowel cleanout     Final Clinical Impression(s) / ED Diagnoses Final diagnoses:  Constipation, unspecified constipation type     @PCDICTATION @    Teressa Lower, MD 08/18/21 1607

## 2021-08-18 NOTE — ED Notes (Signed)
Soap suds enema given and unsuccessful.

## 2021-08-18 NOTE — ED Triage Notes (Signed)
Patient states he thinks it has been approx 3 days since he last had a BM. Patient's wife reports that the patient's rectum is very painful. Patient is currently receiving chemo and the last chemo treatment was 4 days ago.

## 2021-08-19 ENCOUNTER — Ambulatory Visit (HOSPITAL_BASED_OUTPATIENT_CLINIC_OR_DEPARTMENT_OTHER): Payer: Medicare Other | Admitting: Family

## 2021-08-19 ENCOUNTER — Telehealth (HOSPITAL_BASED_OUTPATIENT_CLINIC_OR_DEPARTMENT_OTHER): Payer: Self-pay | Admitting: Cardiology

## 2021-08-19 ENCOUNTER — Telehealth: Payer: Self-pay | Admitting: General Practice

## 2021-08-19 NOTE — Telephone Encounter (Signed)
Transition Care Management Unsuccessful Follow-up Telephone Call  Date of discharge and from where:  08/18/21 from [facility]  Attempts:  1st Attempt  Reason for unsuccessful TCM follow-up call:  No answer/busy

## 2021-08-19 NOTE — Telephone Encounter (Signed)
Left message for patient to call and schedule 3 month follow up with Dr. Harrell Gave per 08/16/21 AVS

## 2021-08-20 ENCOUNTER — Inpatient Hospital Stay: Payer: Medicare Other

## 2021-08-20 ENCOUNTER — Other Ambulatory Visit: Payer: Self-pay

## 2021-08-20 DIAGNOSIS — Z95828 Presence of other vascular implants and grafts: Secondary | ICD-10-CM

## 2021-08-20 DIAGNOSIS — C3411 Malignant neoplasm of upper lobe, right bronchus or lung: Secondary | ICD-10-CM

## 2021-08-20 DIAGNOSIS — Z5111 Encounter for antineoplastic chemotherapy: Secondary | ICD-10-CM | POA: Diagnosis not present

## 2021-08-20 LAB — CMP (CANCER CENTER ONLY)
ALT: 20 U/L (ref 0–44)
AST: 16 U/L (ref 15–41)
Albumin: 3.5 g/dL (ref 3.5–5.0)
Alkaline Phosphatase: 60 U/L (ref 38–126)
Anion gap: 4 — ABNORMAL LOW (ref 5–15)
BUN: 19 mg/dL (ref 8–23)
CO2: 30 mmol/L (ref 22–32)
Calcium: 8.7 mg/dL — ABNORMAL LOW (ref 8.9–10.3)
Chloride: 102 mmol/L (ref 98–111)
Creatinine: 0.93 mg/dL (ref 0.61–1.24)
GFR, Estimated: >60 mL/min (ref >60)
Glucose, Bld: 104 mg/dL — ABNORMAL HIGH (ref 70–99)
Potassium: 4.5 mmol/L (ref 3.5–5.1)
Sodium: 136 mmol/L (ref 135–145)
Total Bilirubin: 0.5 mg/dL (ref 0.3–1.2)
Total Protein: 6.4 g/dL — ABNORMAL LOW (ref 6.5–8.1)

## 2021-08-20 LAB — CBC WITH DIFFERENTIAL (CANCER CENTER ONLY)
Abs Immature Granulocytes: 0.03 10*3/uL (ref 0.00–0.07)
Basophils Absolute: 0 10*3/uL (ref 0.0–0.1)
Basophils Relative: 0 %
Eosinophils Absolute: 0.2 10*3/uL (ref 0.0–0.5)
Eosinophils Relative: 3 %
HCT: 29.5 % — ABNORMAL LOW (ref 39.0–52.0)
Hemoglobin: 9.8 g/dL — ABNORMAL LOW (ref 13.0–17.0)
Immature Granulocytes: 1 %
Lymphocytes Relative: 14 %
Lymphs Abs: 0.8 10*3/uL (ref 0.7–4.0)
MCH: 32.6 pg (ref 26.0–34.0)
MCHC: 33.2 g/dL (ref 30.0–36.0)
MCV: 98 fL (ref 80.0–100.0)
Monocytes Absolute: 0.1 10*3/uL (ref 0.1–1.0)
Monocytes Relative: 2 %
Neutro Abs: 4.8 10*3/uL (ref 1.7–7.7)
Neutrophils Relative %: 80 %
Platelet Count: 112 10*3/uL — ABNORMAL LOW (ref 150–400)
RBC: 3.01 MIL/uL — ABNORMAL LOW (ref 4.22–5.81)
RDW: 17.1 % — ABNORMAL HIGH (ref 11.5–15.5)
WBC Count: 5.9 10*3/uL (ref 4.0–10.5)
nRBC: 0 % (ref 0.0–0.2)

## 2021-08-20 MED ORDER — HEPARIN SOD (PORK) LOCK FLUSH 100 UNIT/ML IV SOLN
500.0000 [IU] | Freq: Once | INTRAVENOUS | Status: AC
  Administered 2021-08-20: 500 [IU]

## 2021-08-20 MED ORDER — SODIUM CHLORIDE 0.9% FLUSH
10.0000 mL | Freq: Once | INTRAVENOUS | Status: AC
  Administered 2021-08-20: 10 mL

## 2021-08-20 NOTE — Addendum Note (Signed)
Addended by: Azzie Almas on: 08/20/2021 03:03 PM   Modules accepted: Orders

## 2021-08-20 NOTE — Telephone Encounter (Signed)
Transition Care Management Unsuccessful Follow-up Telephone Call  Date of discharge and from where:  08/18/21 from [facility]  Attempts:  2nd Attempt  Reason for unsuccessful TCM follow-up call:  No answer/busy

## 2021-08-21 ENCOUNTER — Ambulatory Visit: Payer: Medicare Other

## 2021-08-21 NOTE — Telephone Encounter (Signed)
Transition Care Management Unsuccessful Follow-up Telephone Call  Date of discharge and from where:  08/18/21 from [facility]  Attempts:  3rd Attempt  Reason for unsuccessful TCM follow-up call:  No answer/busy

## 2021-08-28 ENCOUNTER — Other Ambulatory Visit: Payer: Self-pay

## 2021-08-28 ENCOUNTER — Inpatient Hospital Stay: Payer: Medicare Other

## 2021-08-28 ENCOUNTER — Ambulatory Visit: Payer: Medicare Other | Admitting: Internal Medicine

## 2021-08-28 ENCOUNTER — Other Ambulatory Visit: Payer: Medicare Other

## 2021-08-28 ENCOUNTER — Ambulatory Visit: Payer: Medicare Other

## 2021-08-28 DIAGNOSIS — Z95828 Presence of other vascular implants and grafts: Secondary | ICD-10-CM

## 2021-08-28 DIAGNOSIS — C3411 Malignant neoplasm of upper lobe, right bronchus or lung: Secondary | ICD-10-CM

## 2021-08-28 DIAGNOSIS — Z5111 Encounter for antineoplastic chemotherapy: Secondary | ICD-10-CM | POA: Diagnosis not present

## 2021-08-28 LAB — CBC WITH DIFFERENTIAL (CANCER CENTER ONLY)
Abs Immature Granulocytes: 0 10*3/uL (ref 0.00–0.07)
Basophils Absolute: 0 10*3/uL (ref 0.0–0.1)
Basophils Relative: 0 %
Eosinophils Absolute: 0.1 10*3/uL (ref 0.0–0.5)
Eosinophils Relative: 5 %
HCT: 33.1 % — ABNORMAL LOW (ref 39.0–52.0)
Hemoglobin: 10.9 g/dL — ABNORMAL LOW (ref 13.0–17.0)
Immature Granulocytes: 0 %
Lymphocytes Relative: 29 %
Lymphs Abs: 0.8 10*3/uL (ref 0.7–4.0)
MCH: 32.1 pg (ref 26.0–34.0)
MCHC: 32.9 g/dL (ref 30.0–36.0)
MCV: 97.4 fL (ref 80.0–100.0)
Monocytes Absolute: 0.6 10*3/uL (ref 0.1–1.0)
Monocytes Relative: 24 %
Neutro Abs: 1.1 10*3/uL — ABNORMAL LOW (ref 1.7–7.7)
Neutrophils Relative %: 42 %
Platelet Count: 153 10*3/uL (ref 150–400)
RBC: 3.4 MIL/uL — ABNORMAL LOW (ref 4.22–5.81)
RDW: 17.3 % — ABNORMAL HIGH (ref 11.5–15.5)
WBC Count: 2.6 10*3/uL — ABNORMAL LOW (ref 4.0–10.5)
nRBC: 0 % (ref 0.0–0.2)

## 2021-08-28 LAB — CMP (CANCER CENTER ONLY)
ALT: 10 U/L (ref 0–44)
AST: 14 U/L — ABNORMAL LOW (ref 15–41)
Albumin: 3.9 g/dL (ref 3.5–5.0)
Alkaline Phosphatase: 68 U/L (ref 38–126)
Anion gap: 6 (ref 5–15)
BUN: 16 mg/dL (ref 8–23)
CO2: 29 mmol/L (ref 22–32)
Calcium: 9.5 mg/dL (ref 8.9–10.3)
Chloride: 102 mmol/L (ref 98–111)
Creatinine: 0.93 mg/dL (ref 0.61–1.24)
GFR, Estimated: >60 mL/min (ref >60)
Glucose, Bld: 104 mg/dL — ABNORMAL HIGH (ref 70–99)
Potassium: 4.2 mmol/L (ref 3.5–5.1)
Sodium: 137 mmol/L (ref 135–145)
Total Bilirubin: 0.5 mg/dL (ref 0.3–1.2)
Total Protein: 6.6 g/dL (ref 6.5–8.1)

## 2021-08-28 MED ORDER — HEPARIN SOD (PORK) LOCK FLUSH 100 UNIT/ML IV SOLN
500.0000 [IU] | Freq: Once | INTRAVENOUS | Status: AC
  Administered 2021-08-28: 500 [IU]

## 2021-08-28 MED ORDER — SODIUM CHLORIDE 0.9% FLUSH
10.0000 mL | Freq: Once | INTRAVENOUS | Status: AC
  Administered 2021-08-28: 10 mL

## 2021-08-30 NOTE — Progress Notes (Signed)
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  The patient has evidence for disease recurrence and metastasis in December 2022  PRIOR THERAPY: 1) SBRT to the right upper lobe pulmonary nodule as well as the right lower lobe pulmonary nodule under the care of Dr. Sondra Come completed on 11/14/2019. 2) Palliative systemic chemotherapy with carboplatin for an AUC of 5, etoposide 100 mg/m2, and Imfinzi 1500 mg IV every 3 weeks with Cosela.  First dose expected on 04/16/21.  Status post 4 cycles.  CURRENT THERAPY: Zepzelca (lurbinectedin) 3.2 Mg/M2 every 3 weeks.  First dose July 16, 2021.  Status post 2 cycles. Dose reduced to 2.6 mg/m2 starting from cycle #2 due to pancytopenia with cycle #2.      INTERVAL HISTORY: Gerald [name] 81 y.o. male returns to the clinic today for a follow-up visit accompanied by his wife and his daughter.  The patient was recently found to have evidence of disease progression on imaging studies.  Therefore, he was started chemotherapy with Zepzelca.  He had pancytopenia with cycle #1 and hospitalized for sepsis and possible multifocal pneumonia.  Therefore, starting from cycle #2, his dose was reduced a subsequent to 2.6 mg/m2 he tolerated this much better without needing Granix or any transfusion support.  He also received IV fluids with cycle #2 and his family believes that this has helped him a lot with tolerability.  They are requesting IV fluids with infusion today.  He does also have some persistent/ongoing mild hypotension.  Since last being seen, the patient no longer feels weak.  His family notes that he is  has been getting stronger.  He has also not been requiring as much oxygen except when showering. Overall, the patient is feeling fairly well today. He denies any fever, chills, or recent night sweats.  He lost a few pounds since last being seen but his family states that he is eating well at home. The patient does not exert himself so he denies any shortness of breath.  He may have an intermittent cough every once in a while. He denies any chest pain or hemoptysis except he may have a slight chest/rib discomfort with coughing occasionally. Denies any nausea, vomiting, or diarrhea.  He was recently seen in the emergency room for constipation and his family now has him on a bowel regimen.  Miralax. Denies any headache or visual changes.  He is here today for evaluation and repeat blood work before starting cycle #3.    MEDICAL HISTORY: Past Medical History:  Diagnosis Date   Bradycardia    Bursitis    Carotid artery disease (HCC)    COPD (chronic obstructive pulmonary disease) (HCC)    Coronary artery disease    CTO mid-dist CX (fills from left-to-left collaterals), 99% OM2, 30% ost-dist LM, 30% prox-mid LAD, 30% ost-mid CX, 99% prox RCA (too small for PCI), medical management 02/09/20   Hypercholesterolemia    Ischemic cardiomyopathy    Lung cancer ([facility])    Lung nodule    nscl ca dx'd 08/2019   Psoriasis    Tobacco abuse    Wears glasses    reading    ALLERGIES:  is allergic to cosela [trilaciclib].  MEDICATIONS:  Current Outpatient Medications  Medication Sig Dispense Refill   albuterol (VENTOLIN HFA) 108 (90 Base) MCG/ACT inhaler Inhale 2 puffs into the lungs every 6 (six) hours as needed for wheezing or shortness of breath. 18 g 2   AMBULATORY NON FORMULARY MEDICATION Please provide 4 wheel rolling walker with seat for [employer].  Diagnosis: R26.81 Gait instability 1 Device 0   aspirin EC 81 MG tablet Take 1 tablet (81 mg total) by mouth daily. Swallow whole. 90 tablet 3    clobetasol cream (TEMOVATE) 3.54 % Apply 1 application topically daily as needed (Psoriasis).     Fluticasone-Umeclidin-Vilant (TRELEGY ELLIPTA) 100-62.5-25 MCG/ACT AEPB Inhale 1 puff into the lungs daily. 60 each 11   hydrocortisone 1 % ointment Apply 1 application. topically 2 (two) times daily. To the rash. Please follow-up with your dermatologist. (Patient not taking: Reported on 08/16/2021) 30 g 0   lidocaine-prilocaine (EMLA) cream Apply to the Port-A-Cath site 30-60-minute before chemotherapy. 30 g 0   Multiple Vitamins-Minerals (PRESERVISION AREDS 2 PO) Take 1 tablet by mouth in the morning and at bedtime.     niacinamide 500 MG tablet Take 500 mg by mouth daily with breakfast.     nicotine (NICODERM CQ - DOSED IN MG/24 HR) 7 mg/24hr patch Place 1 patch (7 mg total) onto the skin daily. 30 patch 0   polyethylene glycol (MIRALAX) 17 g packet Take 17 g by mouth daily. 14 each 0   prochlorperazine (COMPAZINE) 10 MG tablet Take 1 tablet (10 mg total) by mouth every 6 (six) hours as needed. (Patient taking differently: Take 10 mg by mouth every 6 (six) hours as needed for vomiting or nausea.) 30 tablet 2   rosuvastatin (CRESTOR) 20 MG tablet Take 1 tablet (20 mg total) by mouth daily. 90 tablet 3   tamsulosin (FLOMAX) 0.4 MG CAPS capsule Take 0.4 mg by mouth.     triamcinolone cream (KENALOG) 0.1 % Apply 1 application topically daily as needed (skin irritation.).     vitamin B-12 (CYANOCOBALAMIN) 500 MCG tablet Take 500 mcg by mouth every evening.     No current facility-administered medications for this visit.    SURGICAL HISTORY:  Past Surgical History:  Procedure Laterality Date   BRONCHIAL BIOPSY  09/27/2019   Procedure: BRONCHIAL BIOPSIES;  Surgeon: Garner Nash, DO;  Location: [facility];  Service: Pulmonary;;   BRONCHIAL BRUSHINGS  09/27/2019   Procedure: BRONCHIAL BRUSHINGS;  Surgeon: Garner Nash, DO;  Location: [facility];  Service: Pulmonary;;   BRONCHIAL NEEDLE  ASPIRATION BIOPSY  09/27/2019   Procedure: BRONCHIAL NEEDLE ASPIRATION BIOPSIES;  Surgeon: Garner Nash, DO;  Location: [facility];  Service: Pulmonary;;   BRONCHIAL NEEDLE ASPIRATION BIOPSY  03/29/2021   Procedure: BRONCHIAL NEEDLE ASPIRATION BIOPSIES;  Surgeon: Garner Nash, DO;  Location: [facility];  Service: Pulmonary;;   BRONCHIAL WASHINGS  09/27/2019   Procedure: BRONCHIAL WASHINGS;  Surgeon: Garner Nash, DO;  Location: [facility];  Service: Pulmonary;;   BRONCHIAL WASHINGS  03/29/2021   Procedure: BRONCHIAL WASHINGS;  Surgeon: Garner Nash, DO;  Location: [facility];  Service: Pulmonary;;   COLONOSCOPY     CRYOTHERAPY  09/27/2019   Procedure: Cydney Ok;  Surgeon: Garner Nash, DO;  Location: [facility];  Service: Pulmonary;;   CRYOTHERAPY  03/29/2021   Procedure: CRYOTHERAPY;  Surgeon: Garner Nash, DO;  Location: [facility];  Service: Pulmonary;;   FIDUCIAL MARKER PLACEMENT  09/27/2019  Procedure: FIDUCIAL MARKER PLACEMENT;  Surgeon: Garner Nash, DO;  Location: [facility];  Service: Pulmonary;;   HEMOSTASIS CONTROL  09/27/2019   Procedure: HEMOSTASIS CONTROL;  Surgeon: Garner Nash, DO;  Location: [facility];  Service: Pulmonary;;   HERNIA REPAIR     umbicial- no longer as a "belly button"   IR IMAGING GUIDED PORT INSERTION  05/21/2021   IR US GUIDE VASC ACCESS LEFT  05/21/2021   RIGHT/LEFT HEART CATH AND CORONARY ANGIOGRAPHY N/A 02/09/2020   Procedure: RIGHT/LEFT HEART CATH AND CORONARY ANGIOGRAPHY;  Surgeon: Burnell Blanks, MD;  Location: [facility];  Service: Cardiovascular;  Laterality: N/A;   VIDEO BRONCHOSCOPY WITH ENDOBRONCHIAL NAVIGATION N/A 09/27/2019   Procedure: VIDEO BRONCHOSCOPY WITH ENDOBRONCHIAL NAVIGATION;  Surgeon: Garner Nash, DO;  Location: [facility];  Service: Pulmonary;  Laterality: N/A;   VIDEO BRONCHOSCOPY WITH ENDOBRONCHIAL ULTRASOUND N/A 03/29/2021   Procedure: VIDEO BRONCHOSCOPY  WITH ENDOBRONCHIAL ULTRASOUND;  Surgeon: Garner Nash, DO;  Location: [facility];  Service: Pulmonary;  Laterality: N/A;    REVIEW OF SYSTEMS:   Review of Systems  Constitutional: Negative for appetite change, chills, fatigue, fever and unexpected weight change.  HENT:   Negative for mouth sores, nosebleeds, sore throat and trouble swallowing.   Eyes: Negative for eye problems and icterus.  Respiratory: Positive for mild intermittent cough.  Negative for hemoptysis, shortness of breath and wheezing.   Cardiovascular: Negative for chest pain and leg swelling.  May have mild rib discomfort with coughing occasionally. Gastrointestinal: Negative for abdominal pain, constipation (resolved at this time), diarrhea, nausea and vomiting.  Genitourinary: Negative for bladder incontinence, difficulty urinating, dysuria, frequency and hematuria.   Musculoskeletal: Negative for back pain, gait problem, neck pain and neck stiffness.  Skin: Negative for itching and rash.  Neurological: Negative for dizziness, extremity weakness, gait problem, headaches, light-headedness and seizures.  Hematological: Negative for adenopathy. Does not bruise/bleed easily.  Psychiatric/Behavioral: Negative for confusion, depression and sleep disturbance. The patient is not nervous/anxious.     PHYSICAL EXAMINATION:  There were no vitals taken for this visit.  ECOG PERFORMANCE STATUS: 1  Physical Exam  Constitutional: Oriented to person, place, and time and well-developed, well-nourished, and in no distress.  HENT:  Head: Normocephalic and atraumatic.  Mouth/Throat: Oropharynx is clear and moist. No oropharyngeal exudate.  Eyes: Conjunctivae are normal. Right eye exhibits no discharge. Left eye exhibits no discharge. No scleral icterus.  Neck: Normal range of motion. Neck supple.  Cardiovascular: Normal rate, regular rhythm, normal heart sounds and intact distal pulses.   Pulmonary/Chest: Effort normal.  Quiet  breath sounds in the right lung.  No respiratory distress. No wheezes. No rales.  Abdominal: Soft. Bowel sounds are normal. Exhibits no distension and no mass. There is no tenderness.  Musculoskeletal: Normal range of motion. Exhibits no edema.  Lymphadenopathy:    No cervical adenopathy.  Neurological: Alert and oriented to person, place, and time. Exhibits normal muscle tone. Gait normal. Coordination normal.  Skin: Skin is warm and dry. No rash noted. Not diaphoretic. No erythema. No pallor.  Psychiatric: Mood, memory and judgment normal.  Vitals reviewed.  LABORATORY DATA: Lab Results  Component Value Date   WBC 2.6 (L) 08/28/2021   HGB 10.9 (L) 08/28/2021   HCT 33.1 (L) 08/28/2021   MCV 97.4 08/28/2021   PLT 153 08/28/2021      Chemistry      Component Value Date/Time   NA 137 08/28/2021 1235   NA 139 02/07/2020  1208   K 4.2 08/28/2021 1235   CL 102 08/28/2021 1235   CO2 29 08/28/2021 1235   BUN 16 08/28/2021 1235   BUN 17 02/07/2020 1208   CREATININE 0.93 08/28/2021 1235      Component Value Date/Time   CALCIUM 9.5 08/28/2021 1235   ALKPHOS 68 08/28/2021 1235   AST 14 (L) 08/28/2021 1235   ALT 10 08/28/2021 1235   BILITOT 0.5 08/28/2021 1235       RADIOGRAPHIC STUDIES:  DG Abdomen 1 View  Result Date: 08/18/2021 CLINICAL DATA:  Rectal pain, 3 days since last bowel movement. EXAM: ABDOMEN - 1 VIEW COMPARISON:  CT abdomen pelvis dated 07/04/2021. FINDINGS: There is a moderate amount of stool in the rectum. The bowel gas pattern is normal. Mild degenerative changes are seen in both hips. Moderate to severe degenerative changes are seen in the lumbar spine. IMPRESSION: Moderate amount of stool in the rectum. Electronically Signed   By: Zerita Boers M.D.   On: 08/18/2021 13:55     ASSESSMENT/PLAN:  This is a very pleasant 81 year old Caucasian male recently diagnosed with    1) extensive stage (T2 a, N2, M1 B) small cell lung cancer diagnosed in January  2022. Presented with new masslike area in the medial aspect of the right upper lobe with apparent invasion of the mediastinum and bulky right paratracheal lymphadenopathy in addition to liver lesions. The patient underwent systemic chemotherapy with carboplatin for AUC of 5 on day 1, etoposide 100 Mg/M2 on days 1, 2 and 3 with Cosela 240 Mg/M2 before chemotherapy as well as Imfinzi 1500 Mg IV on day 1 every 3 weeks.  Status post 4 cycles. This was discontinued due to evidence for disease progression in the lung and liver. The patient is currently undergoing second line treatment with Zepzelca (lurbinectedin) 3.2 Mg/M2 every 3 weeks status post 2 cycles.  He was hospitalized following cycle #1 for fever, weakness, neutropenia, and multifocal pneumonia.  Doses of cycle was reduced starting from cycle #2 at 2.6 mg/ metered square.  The patient tolerated this well.   Moving forward, Dr. Julien Nordmann is going to recommend reducing the dose of his chemotherapy by 20 to 25%. He is scheduled to have zepelca 2.6 mg/m2 today.  We will continue to monitor his labs closely and arrange for filgrastim injections if needed.    Labs were reviewed.  Recommend the proceed with cycle #3 today as scheduled.    I will arrange for restaging CT scan the chest, abdomen, pelvis prior to starting his next cycle of treatment.   We will arrange for IVF today due to some hypotension and his family believes this helped him with tolerability of his chemotherapy significantly with his last infusion. They are monitoring his vitals at home closely.    We will see him back for a follow up visit in 3 weeks for evaluation and to review his scan results before starting cycle #4    2) synchronous stage IA non-small cell lung cancer, squamous cell carcinoma involving the right upper lobe pulmonary nodule in addition to right lower lobe endobronchial lesion diagnosed in June 2021 status post SBRT to these lesions under the care of Dr.  Sondra Come. He was advised to call immediately if he has any other concerning symptoms in the interval.   No orders of the defined types were placed in this encounter.    The total time spent in the appointment was 20-29 minutes.   [place],  PA-C 08/30/21

## 2021-09-03 ENCOUNTER — Other Ambulatory Visit: Payer: Medicare Other

## 2021-09-04 ENCOUNTER — Inpatient Hospital Stay: Payer: Medicare Other

## 2021-09-04 ENCOUNTER — Other Ambulatory Visit: Payer: Self-pay

## 2021-09-04 ENCOUNTER — Inpatient Hospital Stay (HOSPITAL_BASED_OUTPATIENT_CLINIC_OR_DEPARTMENT_OTHER): Payer: Medicare Other | Admitting: Physician Assistant

## 2021-09-04 ENCOUNTER — Inpatient Hospital Stay: Payer: Medicare Other | Attending: Internal Medicine

## 2021-09-04 VITALS — BP 100/70

## 2021-09-04 VITALS — BP 99/60 | HR 84 | Temp 97.7°F | Resp 16 | Ht 72.0 in | Wt 199.3 lb

## 2021-09-04 DIAGNOSIS — C3411 Malignant neoplasm of upper lobe, right bronchus or lung: Secondary | ICD-10-CM

## 2021-09-04 DIAGNOSIS — Z79899 Other long term (current) drug therapy: Secondary | ICD-10-CM | POA: Insufficient documentation

## 2021-09-04 DIAGNOSIS — Z87891 Personal history of nicotine dependence: Secondary | ICD-10-CM | POA: Diagnosis not present

## 2021-09-04 DIAGNOSIS — K5900 Constipation, unspecified: Secondary | ICD-10-CM | POA: Diagnosis not present

## 2021-09-04 DIAGNOSIS — Z5111 Encounter for antineoplastic chemotherapy: Secondary | ICD-10-CM

## 2021-09-04 DIAGNOSIS — C3431 Malignant neoplasm of lower lobe, right bronchus or lung: Secondary | ICD-10-CM | POA: Diagnosis present

## 2021-09-04 DIAGNOSIS — Z95828 Presence of other vascular implants and grafts: Secondary | ICD-10-CM

## 2021-09-04 LAB — CMP (CANCER CENTER ONLY)
ALT: 11 U/L (ref 0–44)
AST: 17 U/L (ref 15–41)
Albumin: 3.7 g/dL (ref 3.5–5.0)
Alkaline Phosphatase: 66 U/L (ref 38–126)
Anion gap: 6 (ref 5–15)
BUN: 14 mg/dL (ref 8–23)
CO2: 27 mmol/L (ref 22–32)
Calcium: 9.3 mg/dL (ref 8.9–10.3)
Chloride: 105 mmol/L (ref 98–111)
Creatinine: 0.91 mg/dL (ref 0.61–1.24)
GFR, Estimated: >60 mL/min (ref >60)
Glucose, Bld: 120 mg/dL — ABNORMAL HIGH (ref 70–99)
Potassium: 4.1 mmol/L (ref 3.5–5.1)
Sodium: 138 mmol/L (ref 135–145)
Total Bilirubin: 0.4 mg/dL (ref 0.3–1.2)
Total Protein: 6.4 g/dL — ABNORMAL LOW (ref 6.5–8.1)

## 2021-09-04 LAB — CBC WITH DIFFERENTIAL (CANCER CENTER ONLY)
Abs Immature Granulocytes: 0.04 10*3/uL (ref 0.00–0.07)
Basophils Absolute: 0 10*3/uL (ref 0.0–0.1)
Basophils Relative: 1 %
Eosinophils Absolute: 0.1 10*3/uL (ref 0.0–0.5)
Eosinophils Relative: 2 %
HCT: 33.4 % — ABNORMAL LOW (ref 39.0–52.0)
Hemoglobin: 11 g/dL — ABNORMAL LOW (ref 13.0–17.0)
Immature Granulocytes: 1 %
Lymphocytes Relative: 17 %
Lymphs Abs: 0.8 10*3/uL (ref 0.7–4.0)
MCH: 32.5 pg (ref 26.0–34.0)
MCHC: 32.9 g/dL (ref 30.0–36.0)
MCV: 98.8 fL (ref 80.0–100.0)
Monocytes Absolute: 0.9 10*3/uL (ref 0.1–1.0)
Monocytes Relative: 19 %
Neutro Abs: 2.9 10*3/uL (ref 1.7–7.7)
Neutrophils Relative %: 60 %
Platelet Count: 193 10*3/uL (ref 150–400)
RBC: 3.38 MIL/uL — ABNORMAL LOW (ref 4.22–5.81)
RDW: 16.9 % — ABNORMAL HIGH (ref 11.5–15.5)
WBC Count: 4.7 10*3/uL (ref 4.0–10.5)
nRBC: 0 % (ref 0.0–0.2)

## 2021-09-04 MED ORDER — SODIUM CHLORIDE 0.9 % IV SOLN: Freq: Once | INTRAVENOUS | Status: AC

## 2021-09-04 MED ORDER — HEPARIN SOD (PORK) LOCK FLUSH 100 UNIT/ML IV SOLN
500.0000 [IU] | Freq: Once | INTRAVENOUS | Status: AC | PRN
  Administered 2021-09-04: 500 [IU]

## 2021-09-04 MED ORDER — PALONOSETRON HCL INJECTION 0.25 MG/5ML
0.2500 mg | Freq: Once | INTRAVENOUS | Status: AC
  Administered 2021-09-04: 0.25 mg via INTRAVENOUS
  Filled 2021-09-04: qty 5

## 2021-09-04 MED ORDER — SODIUM CHLORIDE 0.9% FLUSH
10.0000 mL | INTRAVENOUS | Status: DC | PRN
  Administered 2021-09-04: 10 mL

## 2021-09-04 MED ORDER — SODIUM CHLORIDE 0.9% FLUSH
10.0000 mL | Freq: Once | INTRAVENOUS | Status: AC
  Administered 2021-09-04: 10 mL

## 2021-09-04 MED ORDER — SODIUM CHLORIDE 0.9 % IV SOLN
2.6000 mg/m2 | Freq: Once | INTRAVENOUS | Status: AC
  Administered 2021-09-04: 5.6 mg via INTRAVENOUS
  Filled 2021-09-04: qty 11.2

## 2021-09-04 MED ORDER — SODIUM CHLORIDE 0.9 % IV SOLN
10.0000 mg | Freq: Once | INTRAVENOUS | Status: AC
  Administered 2021-09-04: 10 mg via INTRAVENOUS
  Filled 2021-09-04: qty 10

## 2021-09-04 NOTE — Patient Instructions (Signed)
[facility]  Discharge Instructions: Thank you for choosing [facility] to provide your oncology and hematology care.   If you have a lab appointment with the [facility], please go directly to the [facility] and check in at the registration area.   Wear comfortable clothing and clothing appropriate for easy access to any Portacath or PICC line.   We strive to give you quality time with your provider. You may need to reschedule your appointment if you arrive late (15 or more minutes).  Arriving late affects you and other patients whose appointments are after yours.  Also, if you miss three or more appointments without notifying the office, you may be dismissed from the clinic at the provider's discretion.      For prescription refill requests, have your pharmacy contact our office and allow 72 hours for refills to be completed.    Today you received the following chemotherapy and/or immunotherapy agents: Zepzelca      To help prevent nausea and vomiting after your treatment, we encourage you to take your nausea medication as directed.  BELOW ARE SYMPTOMS THAT SHOULD BE REPORTED IMMEDIATELY: *FEVER GREATER THAN 100.4 F (38 C) OR HIGHER *CHILLS OR SWEATING *NAUSEA AND VOMITING THAT IS NOT CONTROLLED WITH YOUR NAUSEA MEDICATION *UNUSUAL SHORTNESS OF BREATH *UNUSUAL BRUISING OR BLEEDING *URINARY PROBLEMS (pain or burning when urinating, or frequent urination) *BOWEL PROBLEMS (unusual diarrhea, constipation, pain near the anus) TENDERNESS IN MOUTH AND THROAT WITH OR WITHOUT PRESENCE OF ULCERS (sore throat, sores in mouth, or a toothache) UNUSUAL RASH, SWELLING OR PAIN  UNUSUAL VAGINAL DISCHARGE OR ITCHING   Items with * indicate a potential emergency and should be followed up as soon as possible or go to the Emergency Department if any problems should occur.  Please show the CHEMOTHERAPY ALERT CARD or IMMUNOTHERAPY ALERT CARD at check-in to  the Emergency Department and triage nurse.  Should you have questions after your visit or need to cancel or reschedule your appointment, please contact [facility]  Dept: [phone]  and follow the prompts.  Office hours are 8:00 a.m. to 4:30 p.m. Monday - Friday. Please note that voicemails left after 4:00 p.m. may not be returned until the following business day.  We are closed weekends and major holidays. You have access to a nurse at all times for urgent questions. Please call the main number to the clinic Dept: [phone] and follow the prompts.   For any non-urgent questions, you may also contact your provider using MyChart. We now offer e-Visits for anyone 83 and older to request care online for non-urgent symptoms. For details visit [email or web address].   Also download the MyChart app! Go to the app store, search "MyChart", open the app, select [facility], and log in with your MyChart username and password.  Due to Covid, a mask is required upon entering the hospital/clinic. If you do not have a mask, one will be given to you upon arrival. For doctor visits, patients may have 1 support person aged 83 or older with them. For treatment visits, patients cannot have anyone with them due to current Covid guidelines and our immunocompromised population.

## 2021-09-09 ENCOUNTER — Ambulatory Visit (HOSPITAL_BASED_OUTPATIENT_CLINIC_OR_DEPARTMENT_OTHER): Payer: Medicare Other | Admitting: Cardiology

## 2021-09-09 ENCOUNTER — Telehealth: Payer: Self-pay | Admitting: Medical Oncology

## 2021-09-09 NOTE — Telephone Encounter (Signed)
Restarted miralax for constipation.  Appt tomorrow for labs .

## 2021-09-10 ENCOUNTER — Other Ambulatory Visit: Payer: Self-pay

## 2021-09-10 ENCOUNTER — Inpatient Hospital Stay (HOSPITAL_BASED_OUTPATIENT_CLINIC_OR_DEPARTMENT_OTHER): Payer: Medicare Other | Admitting: Physician Assistant

## 2021-09-10 ENCOUNTER — Inpatient Hospital Stay: Payer: Medicare Other

## 2021-09-10 VITALS — BP 106/65 | HR 85 | Temp 98.0°F | Resp 16 | Wt 201.5 lb

## 2021-09-10 DIAGNOSIS — Z95828 Presence of other vascular implants and grafts: Secondary | ICD-10-CM | POA: Diagnosis not present

## 2021-09-10 DIAGNOSIS — K5900 Constipation, unspecified: Secondary | ICD-10-CM

## 2021-09-10 DIAGNOSIS — C3411 Malignant neoplasm of upper lobe, right bronchus or lung: Secondary | ICD-10-CM

## 2021-09-10 DIAGNOSIS — C3490 Malignant neoplasm of unspecified part of unspecified bronchus or lung: Secondary | ICD-10-CM

## 2021-09-10 DIAGNOSIS — Z5111 Encounter for antineoplastic chemotherapy: Secondary | ICD-10-CM | POA: Diagnosis not present

## 2021-09-10 LAB — CBC WITH DIFFERENTIAL (CANCER CENTER ONLY)
Abs Immature Granulocytes: 0.02 10*3/uL (ref 0.00–0.07)
Basophils Absolute: 0 10*3/uL (ref 0.0–0.1)
Basophils Relative: 0 %
Eosinophils Absolute: 0 10*3/uL (ref 0.0–0.5)
Eosinophils Relative: 1 %
HCT: 31.4 % — ABNORMAL LOW (ref 39.0–52.0)
Hemoglobin: 10.2 g/dL — ABNORMAL LOW (ref 13.0–17.0)
Immature Granulocytes: 0 %
Lymphocytes Relative: 13 %
Lymphs Abs: 0.7 10*3/uL (ref 0.7–4.0)
MCH: 32.4 pg (ref 26.0–34.0)
MCHC: 32.5 g/dL (ref 30.0–36.0)
MCV: 99.7 fL (ref 80.0–100.0)
Monocytes Absolute: 0.2 10*3/uL (ref 0.1–1.0)
Monocytes Relative: 3 %
Neutro Abs: 4.4 10*3/uL (ref 1.7–7.7)
Neutrophils Relative %: 83 %
Platelet Count: 133 10*3/uL — ABNORMAL LOW (ref 150–400)
RBC: 3.15 MIL/uL — ABNORMAL LOW (ref 4.22–5.81)
RDW: 15.9 % — ABNORMAL HIGH (ref 11.5–15.5)
WBC Count: 5.3 10*3/uL (ref 4.0–10.5)
nRBC: 0 % (ref 0.0–0.2)

## 2021-09-10 LAB — CMP (CANCER CENTER ONLY)
ALT: 19 U/L (ref 0–44)
AST: 19 U/L (ref 15–41)
Albumin: 3.6 g/dL (ref 3.5–5.0)
Alkaline Phosphatase: 60 U/L (ref 38–126)
Anion gap: 4 — ABNORMAL LOW (ref 5–15)
BUN: 16 mg/dL (ref 8–23)
CO2: 32 mmol/L (ref 22–32)
Calcium: 9.2 mg/dL (ref 8.9–10.3)
Chloride: 103 mmol/L (ref 98–111)
Creatinine: 0.86 mg/dL (ref 0.61–1.24)
GFR, Estimated: >60 mL/min (ref >60)
Glucose, Bld: 134 mg/dL — ABNORMAL HIGH (ref 70–99)
Potassium: 4.6 mmol/L (ref 3.5–5.1)
Sodium: 139 mmol/L (ref 135–145)
Total Bilirubin: 0.4 mg/dL (ref 0.3–1.2)
Total Protein: 6.5 g/dL (ref 6.5–8.1)

## 2021-09-10 MED ORDER — LIDOCAINE-PRILOCAINE 2.5-2.5 % EX CREA: TOPICAL_CREAM | CUTANEOUS | 1 refills | Status: AC

## 2021-09-10 MED ORDER — SODIUM CHLORIDE 0.9% FLUSH
10.0000 mL | Freq: Once | INTRAVENOUS | Status: AC
  Administered 2021-09-10: 10 mL

## 2021-09-10 MED ORDER — HEPARIN SOD (PORK) LOCK FLUSH 100 UNIT/ML IV SOLN
500.0000 [IU] | Freq: Once | INTRAVENOUS | Status: AC
  Administered 2021-09-10: 500 [IU]

## 2021-09-10 MED ORDER — HEPARIN SOD (PORK) LOCK FLUSH 100 UNIT/ML IV SOLN: 500.0000 [IU] | Freq: Once | INTRAVENOUS | Status: DC

## 2021-09-10 NOTE — Progress Notes (Signed)
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 with oncologic history as above presenting to [facility] today with chief complaint of constipation x 3 days. Patient's spouse and daughter accompany him today and provide additional history. Patient has been experiencing constipation after his last 2 immunotherapy infusions. He was seen in the ED for constipation on 08/18/21 and had successful bowel movement after a soapsuds enema. Patient then used miralax at home for several days after ED visit and then developed diarrhea so he discontinued it. His bowels had been moving daily until the last 3 days ago when he passed what he describes as "hard pebbles." He tries to stay well hydrated and drinks several cups of water daily. He has not tried any OTC medications for symptoms prior to arrival today. He admits to passing flatus. He denies any change in appetite, fever, chills, back pain, abdominal pain, nausea, emesis, blood in stool, urinary symptoms. He is not currently taking any narcotics.      ROS  All  other systems are reviewed and are negative for acute change except as noted in the HPI.    Allergies  Allergen Reactions   Cosela [Trilaciclib] Other (See Comments)    thrombophlebitis     Past Medical History:  Diagnosis Date   Bradycardia    Bursitis    Carotid artery disease (HCC)    COPD (chronic obstructive pulmonary disease) (HCC)    Coronary artery disease    CTO mid-dist CX (fills from left-to-left collaterals), 99% OM2, 30% ost-dist LM, 30% prox-mid LAD, 30% ost-mid CX, 99% prox RCA (too small for PCI), medical management 02/09/20   Hypercholesterolemia    Ischemic cardiomyopathy    Lung cancer ([place])    Lung nodule    nscl ca dx'd 08/2019   Psoriasis    Tobacco abuse    Wears glasses    reading     Past Surgical History:  Procedure Laterality Date   BRONCHIAL BIOPSY  09/27/2019   Procedure: BRONCHIAL BIOPSIES;  Surgeon: Garner Nash, DO;  Location: [place] [facility];  Service: Pulmonary;;   BRONCHIAL BRUSHINGS  09/27/2019   Procedure: BRONCHIAL BRUSHINGS;  Surgeon: Garner Nash, DO;  Location: [place] [facility];  Service: Pulmonary;;   BRONCHIAL NEEDLE ASPIRATION BIOPSY  09/27/2019   Procedure: BRONCHIAL NEEDLE ASPIRATION BIOPSIES;  Surgeon: Garner Nash, DO;  Location: [place] [facility];  Service: Pulmonary;;   BRONCHIAL NEEDLE ASPIRATION BIOPSY  03/29/2021   Procedure: BRONCHIAL NEEDLE ASPIRATION BIOPSIES;  Surgeon: Garner Nash, DO;  Location: [place] [facility];  Service: Pulmonary;;   BRONCHIAL WASHINGS  09/27/2019   Procedure: BRONCHIAL WASHINGS;  Surgeon: Garner Nash, DO;  Location: [place] [facility];  Service: Pulmonary;;   BRONCHIAL WASHINGS  03/29/2021   Procedure: BRONCHIAL WASHINGS;  Surgeon: Garner Nash, DO;  Location: [place] [facility];  Service: Pulmonary;;   COLONOSCOPY     CRYOTHERAPY  09/27/2019   Procedure: Cydney Ok;  Surgeon: Garner Nash, DO;  Location: [place] [facility];  Service: Pulmonary;;   CRYOTHERAPY  03/29/2021   Procedure:  CRYOTHERAPY;  Surgeon: Garner Nash, DO;  Location: [place] [facility];  Service: Pulmonary;;   FIDUCIAL MARKER PLACEMENT  09/27/2019   Procedure: FIDUCIAL MARKER PLACEMENT;  Surgeon: Garner Nash, DO;  Location: [place] [facility];  Service: Pulmonary;;   HEMOSTASIS CONTROL  09/27/2019   Procedure: HEMOSTASIS CONTROL;  Surgeon: Garner Nash, DO;  Location: [place];  Service: Pulmonary;;   HERNIA REPAIR     umbicial- no longer as a "belly button"   IR IMAGING GUIDED PORT INSERTION  05/21/2021   IR US GUIDE VASC ACCESS LEFT  05/21/2021   RIGHT/LEFT HEART CATH AND CORONARY ANGIOGRAPHY N/A 02/09/2020   Procedure: RIGHT/LEFT HEART CATH AND CORONARY ANGIOGRAPHY;  Surgeon: Burnell Blanks, MD;  Location: [place] [facility];  Service: Cardiovascular;  Laterality: N/A;   VIDEO BRONCHOSCOPY WITH ENDOBRONCHIAL NAVIGATION N/A 09/27/2019   Procedure: VIDEO BRONCHOSCOPY WITH ENDOBRONCHIAL NAVIGATION;  Surgeon: Garner Nash, DO;  Location: [place];  Service: Pulmonary;  Laterality: N/A;   VIDEO BRONCHOSCOPY WITH ENDOBRONCHIAL ULTRASOUND N/A 03/29/2021   Procedure: VIDEO BRONCHOSCOPY WITH ENDOBRONCHIAL ULTRASOUND;  Surgeon: Garner Nash, DO;  Location: [place];  Service: Pulmonary;  Laterality: N/A;    Social History   Socioeconomic History   Marital status: Married    Spouse name: Not on file   Number of children: Not on file   Years of education: Not on file   Highest education level: Not on file  Occupational History   Not on file  Tobacco Use   Smoking status: Former    Packs/day: 2.00    Years: 62.00    Total pack years: 124.00    Types: Cigarettes    Passive exposure: Past   Smokeless tobacco: Never   Tobacco comments:    Pt currently using nicotine patch.  Vaping Use   Vaping Use: Never used  Substance and Sexual Activity   Alcohol use: Not Currently    Comment: rare   Drug use: Never   Sexual activity: Not Currently  Other Topics Concern   Not on  file  Social History Narrative   Not on file   Social Determinants of Health   Financial Resource Strain: Not on file  Food Insecurity: Not on file  Transportation Needs: Not on file  Physical Activity: Not on file  Stress: Not on file  Social Connections: Not on file  Intimate Partner Violence: Not on file    Family History  Problem Relation Age of Onset   Heart attack Father    Mesothelioma Sister 42   Lung cancer Brother      Current Outpatient Medications:    albuterol (VENTOLIN HFA) 108 (90 Base) MCG/ACT inhaler, Inhale 2 puffs into the lungs every 6 (six) hours as needed for wheezing or shortness of breath., Disp: 18 g, Rfl: 2   AMBULATORY NON FORMULARY MEDICATION, Please provide 4 wheel rolling walker with seat for [employer].  Diagnosis: R26.81 Gait instability, Disp: 1 Device, Rfl: 0  aspirin EC 81 MG tablet, Take 1 tablet (81 mg total) by mouth daily. Swallow whole., Disp: 90 tablet, Rfl: 3   clobetasol cream (TEMOVATE) 9.52 %, Apply 1 application topically daily as needed (Psoriasis)., Disp: , Rfl:    Fluticasone-Umeclidin-Vilant (TRELEGY ELLIPTA) 100-62.5-25 MCG/ACT AEPB, Inhale 1 puff into the lungs daily., Disp: 60 each, Rfl: 11   hydrocortisone 1 % ointment, Apply 1 application. topically 2 (two) times daily. To the rash. Please follow-up with your dermatologist. (Patient not taking: Reported on 09/04/2021), Disp: 30 g, Rfl: 0   lidocaine-prilocaine (EMLA) cream, Apply to the Port-A-Cath site 30-60-minute before chemotherapy., Disp: 30 g, Rfl: 1   methylcellulose oral powder, Take 1 packet by mouth daily. Take 1 packet daily for constipation, Disp: , Rfl:    Multiple Vitamins-Minerals (PRESERVISION AREDS 2 PO), Take 1 tablet by mouth in the morning and at bedtime., Disp: , Rfl:    niacinamide 500 MG tablet, Take 500 mg by mouth daily with breakfast., Disp: , Rfl:    prochlorperazine (COMPAZINE) 10 MG tablet, Take 1 tablet (10 mg total) by mouth every 6 (six) hours as  needed. (Patient taking differently: Take 10 mg by mouth every 6 (six) hours as needed for vomiting or nausea.), Disp: 30 tablet, Rfl: 2   rosuvastatin (CRESTOR) 20 MG tablet, Take 1 tablet (20 mg total) by mouth daily., Disp: 90 tablet, Rfl: 3   tamsulosin (FLOMAX) 0.4 MG CAPS capsule, Take 0.4 mg by mouth., Disp: , Rfl:    triamcinolone cream (KENALOG) 0.1 %, Apply 1 application topically daily as needed (skin irritation.)., Disp: , Rfl:    vitamin B-12 (CYANOCOBALAMIN) 500 MCG tablet, Take 500 mcg by mouth every evening., Disp: , Rfl:   PHYSICAL EXAM: ECOG FS:1 - Symptomatic but completely ambulatory    Vitals:   09/10/21 1333  BP: 106/65  Pulse: 85  Resp: 16  Temp: 98 F (36.7 C)  TempSrc: Oral  SpO2: 100%  Weight: 201 lb 8 oz (91.4 kg)   Physical Exam Vitals and nursing note reviewed.  Constitutional:      Appearance: He is well-developed. He is not ill-appearing or toxic-appearing.  HENT:     Head: Normocephalic and atraumatic.     Nose: Nose normal.     Mouth/Throat:     Mouth: Mucous membranes are moist.  Eyes:     General: No scleral icterus.       Right eye: No discharge.        Left eye: No discharge.     Conjunctiva/sclera: Conjunctivae normal.  Neck:     Vascular: No JVD.  Cardiovascular:     Rate and Rhythm: Normal rate and regular rhythm.     Pulses: Normal pulses.     Heart sounds: Normal heart sounds.  Pulmonary:     Effort: Pulmonary effort is normal.     Breath sounds: Normal breath sounds.  Chest:     Comments: Port without signs of infection Abdominal:     General: Bowel sounds are normal. There is no distension.     Palpations: Abdomen is soft. There is no mass.     Tenderness: There is no abdominal tenderness. There is no right CVA tenderness, left CVA tenderness, guarding or rebound.     Hernia: No hernia is present.  Musculoskeletal:        General: Normal range of motion.     Cervical back: Normal range of motion.  Skin:    General:  Skin is warm and  dry.  Neurological:     Mental Status: He is oriented to person, place, and time.     GCS: GCS eye subscore is 4. GCS verbal subscore is 5. GCS motor subscore is 6.     Comments: Fluent speech, no facial droop.  Psychiatric:        Behavior: Behavior normal.        LABORATORY DATA: I have reviewed the data as listed    Latest Ref Rng & Units 09/10/2021    1:16 PM 09/04/2021    8:23 AM 08/28/2021   12:35 PM  CBC  WBC 4.0 - 10.5 K/uL 5.3  4.7  2.6   Hemoglobin 13.0 - 17.0 g/dL 10.2  11.0  10.9   Hematocrit 39.0 - 52.0 % 31.4  33.4  33.1   Platelets 150 - 400 K/uL 133  193  153         Latest Ref Rng & Units 09/10/2021    1:16 PM 09/04/2021    8:23 AM 08/28/2021   12:35 PM  CMP  Glucose 70 - 99 mg/dL 134  120  104   BUN 8 - 23 mg/dL 16  14  16    Creatinine 0.61 - 1.24 mg/dL 0.86  0.91  0.93   Sodium 135 - 145 mmol/L 139  138  137   Potassium 3.5 - 5.1 mmol/L 4.6  4.1  4.2   Chloride 98 - 111 mmol/L 103  105  102   CO2 22 - 32 mmol/L 32  27  29   Calcium 8.9 - 10.3 mg/dL 9.2  9.3  9.5   Total Protein 6.5 - 8.1 g/dL 6.5  6.4  6.6   Total Bilirubin 0.3 - 1.2 mg/dL 0.4  0.4  0.5   Alkaline Phos 38 - 126 U/L 60  66  68   AST 15 - 41 U/L 19  17  14    ALT 0 - 44 U/L 19  11  10         RADIOGRAPHIC STUDIES: I have personally reviewed the radiological images as listed and agreed with the findings in the report. No images are attached to the encounter. DG Abdomen 1 View  Result Date: 08/18/2021 CLINICAL DATA:  Rectal pain, 3 days since last bowel movement. EXAM: ABDOMEN - 1 VIEW COMPARISON:  CT abdomen pelvis dated 07/04/2021. FINDINGS: There is a moderate amount of stool in the rectum. The bowel gas pattern is normal. Mild degenerative changes are seen in both hips. Moderate to severe degenerative changes are seen in the lumbar spine. IMPRESSION: Moderate amount of stool in the rectum. Electronically Signed   By: Zerita Boers M.D.   On: 08/18/2021 13:55      ASSESSMENT & PLAN: Patient is a 81 y.o. male  with oncologic history of small cell lung cancer followed by Dr. Earlie Server.  I have viewed most recent oncology note and lab work.  #)Constipation- Patient nontoxic appearing. He is afebrile, HDS. His abdominal exam is benign, no peritoneal signs. I viewed labs today which show CBC without leukocytosis, hemoglobin consistent with baseline, and thrombocytopenia 133 similar to recent labs as well.  CMP is overall unremarkable.  Constipation is likely treatment induced.  I had lengthy discussion with patient and family about daily bowel regimen at home including MiraLAX and Colace.  Advised patient to temporarily stop if he develops diarrhea and when he should resume these interventions.  Patient does not show signs of dehydration on exam today and is tolerating p.o.  intake.  No indications for emergent imaging at this time.  Patient and family are agreeable with this plan.  #) Small cell lung cancer-patient's next appointment with oncologist is 09/24/2021.   Strict ED precautions discussed should symptoms worsen.   Visit Diagnosis: 1. Port-A-Cath in place   2. Small cell lung cancer ([place])   3. Constipation, unspecified constipation type      No orders of the defined types were placed in this encounter.   All questions were answered. The patient knows to call the clinic with any problems, questions or concerns. No barriers to learning was detected.  I have spent a total of 20 minutes minutes of face-to-face and non-face-to-face time, preparing to see the patient, obtaining and/or reviewing separately obtained history, performing a medically appropriate examination, counseling and educating the patient, ordering tests,  documenting clinical information in the electronic health record, and care coordination.     Thank you for allowing me to participate in the care of this patient.    Barrie Folk, PA-C Department of  Hematology/Oncology [facility] at [facility] Phone: [phone]  [phone]    09/10/2021 4:41 PM

## 2021-09-10 NOTE — Patient Instructions (Signed)
To help reduce constipation and promote bowel health, 1. Try to drink at least 64 ounces of water each day; 2. Eat plenty of fiber (fruits, vegetables, whole grains, legumes) 3. Get plenty of physical activity  If needed, you may also use daily or as needed, MiraLax (Osmotic Laxative) up to  1-2 times a day and Colace (Stool Softener aka Docusate) 100 mg up to twice a day to help with bowel movements. These medications are over the counter. If you start to have diarrhea then you should stop the Miralax and Colace and wait to restart them until your stool becomes hard.   If you develop severe abdominal pain, fever, vomiting, distention of your abdomen, unable to have a bowel movement for 5 days or are not passing gas, please seek emergency medical treatment.

## 2021-09-11 ENCOUNTER — Other Ambulatory Visit: Payer: Medicare Other

## 2021-09-16 ENCOUNTER — Encounter: Payer: Self-pay | Admitting: Family Medicine

## 2021-09-16 ENCOUNTER — Ambulatory Visit (INDEPENDENT_AMBULATORY_CARE_PROVIDER_SITE_OTHER): Payer: Medicare Other | Admitting: Family Medicine

## 2021-09-16 VITALS — BP 99/65 | HR 84 | Ht 72.0 in | Wt 199.0 lb

## 2021-09-16 DIAGNOSIS — K5900 Constipation, unspecified: Secondary | ICD-10-CM

## 2021-09-16 DIAGNOSIS — R338 Other retention of urine: Secondary | ICD-10-CM | POA: Diagnosis not present

## 2021-09-16 DIAGNOSIS — I10 Essential (primary) hypertension: Secondary | ICD-10-CM

## 2021-09-16 DIAGNOSIS — J431 Panlobular emphysema: Secondary | ICD-10-CM

## 2021-09-16 DIAGNOSIS — C3490 Malignant neoplasm of unspecified part of unspecified bronchus or lung: Secondary | ICD-10-CM | POA: Diagnosis not present

## 2021-09-16 DIAGNOSIS — Z87898 Personal history of other specified conditions: Secondary | ICD-10-CM

## 2021-09-16 LAB — POCT URINALYSIS DIP (CLINITEK)
Bilirubin, UA: NEGATIVE
Blood, UA: NEGATIVE
Glucose, UA: NEGATIVE mg/dL
Ketones, POC UA: NEGATIVE mg/dL
Leukocytes, UA: NEGATIVE
Nitrite, UA: NEGATIVE
POC PROTEIN,UA: NEGATIVE
Spec Grav, UA: 1.015 (ref 1.010–1.025)
Urobilinogen, UA: 0.2 E.U./dL
pH, UA: 7 (ref 5.0–8.0)

## 2021-09-16 NOTE — Progress Notes (Signed)
Gerald [name] - 81 y.o. male MRN [id number]  Date of birth: 02-21-1941  Subjective No chief complaint on file.   HPI Gerald [name] is a 81 y.o. male here today for follow up.  Continues to undergo treatment for SCC of the RUL of the lung.  He reports that he is doing "OK".    Had UTI previously.  Wants to have urine rechecked to be sure this has resolved.  Denies symptoms at this time.    BP has been low.  Off of all antihypertensives at this time.  Denies symptoms currently including dizziness.    In addition to oncology he continues to see pulmonology.  Remains on trelegy with albuterol as needed.    Tolerating crestor well for management of HLD.    ROS:  A comprehensive ROS was completed and negative except as noted per HPI  Allergies  Allergen Reactions   Cosela [Trilaciclib] Other (See Comments)    thrombophlebitis    Past Medical History:  Diagnosis Date   Bradycardia    Bursitis    Carotid artery disease (HCC)    COPD (chronic obstructive pulmonary disease) (HCC)    Coronary artery disease    CTO mid-dist CX (fills from left-to-left collaterals), 99% OM2, 30% ost-dist LM, 30% prox-mid LAD, 30% ost-mid CX, 99% prox RCA (too small for PCI), medical management 02/09/20   Hypercholesterolemia    Ischemic cardiomyopathy    Lung cancer ([place])    Lung nodule    nscl ca dx'd 08/2019   Psoriasis    Tobacco abuse    Wears glasses    reading    Past Surgical History:  Procedure Laterality Date   BRONCHIAL BIOPSY  09/27/2019   Procedure: BRONCHIAL BIOPSIES;  Surgeon: Garner Nash, DO;  Location: [place] [facility];  Service: Pulmonary;;   BRONCHIAL BRUSHINGS  09/27/2019   Procedure: BRONCHIAL BRUSHINGS;  Surgeon: Garner Nash, DO;  Location: [place] [facility];  Service: Pulmonary;;   BRONCHIAL NEEDLE ASPIRATION BIOPSY  09/27/2019   Procedure: BRONCHIAL NEEDLE ASPIRATION BIOPSIES;  Surgeon: Garner Nash, DO;  Location: [place];  Service: Pulmonary;;   BRONCHIAL NEEDLE  ASPIRATION BIOPSY  03/29/2021   Procedure: BRONCHIAL NEEDLE ASPIRATION BIOPSIES;  Surgeon: Garner Nash, DO;  Location: [place] [facility];  Service: Pulmonary;;   BRONCHIAL WASHINGS  09/27/2019   Procedure: BRONCHIAL WASHINGS;  Surgeon: Garner Nash, DO;  Location: [place] [facility];  Service: Pulmonary;;   BRONCHIAL WASHINGS  03/29/2021   Procedure: BRONCHIAL WASHINGS;  Surgeon: Garner Nash, DO;  Location: [place] [facility];  Service: Pulmonary;;   COLONOSCOPY     CRYOTHERAPY  09/27/2019   Procedure: Cydney Ok;  Surgeon: Garner Nash, DO;  Location: [place] [facility];  Service: Pulmonary;;   CRYOTHERAPY  03/29/2021   Procedure: CRYOTHERAPY;  Surgeon: Garner Nash, DO;  Location: [place] [facility];  Service: Pulmonary;;   FIDUCIAL MARKER PLACEMENT  09/27/2019   Procedure: FIDUCIAL MARKER PLACEMENT;  Surgeon: Garner Nash, DO;  Location: [place] [facility];  Service: Pulmonary;;   HEMOSTASIS CONTROL  09/27/2019   Procedure: HEMOSTASIS CONTROL;  Surgeon: Garner Nash, DO;  Location: [place] [facility];  Service: Pulmonary;;   HERNIA REPAIR     umbicial- no longer as a "belly button"   IR IMAGING GUIDED PORT INSERTION  05/21/2021   IR US GUIDE VASC ACCESS LEFT  05/21/2021   RIGHT/LEFT HEART CATH AND CORONARY ANGIOGRAPHY N/A 02/09/2020   Procedure: RIGHT/LEFT HEART CATH AND CORONARY ANGIOGRAPHY;  Surgeon: Burnell Blanks, MD;  Location[facility]  INVASIVE CV [facility];  Service: Cardiovascular;  Laterality: N/A;   VIDEO BRONCHOSCOPY WITH ENDOBRONCHIAL NAVIGATION N/A 09/27/2019   Procedure: VIDEO BRONCHOSCOPY WITH ENDOBRONCHIAL NAVIGATION;  Surgeon: Garner Nash, DO;  Location: [place];  Service: Pulmonary;  Laterality: N/A;   VIDEO BRONCHOSCOPY WITH ENDOBRONCHIAL ULTRASOUND N/A 03/29/2021   Procedure: VIDEO BRONCHOSCOPY WITH ENDOBRONCHIAL ULTRASOUND;  Surgeon: Garner Nash, DO;  Location: [place];  Service: Pulmonary;  Laterality: N/A;    Social History   Socioeconomic History    Marital status: Married    Spouse name: Not on file   Number of children: Not on file   Years of education: Not on file   Highest education level: Not on file  Occupational History   Not on file  Tobacco Use   Smoking status: Former    Packs/day: 2.00    Years: 62.00    Total pack years: 124.00    Types: Cigarettes    Passive exposure: Past   Smokeless tobacco: Never   Tobacco comments:    Pt currently using nicotine patch.  Vaping Use   Vaping Use: Never used  Substance and Sexual Activity   Alcohol use: Not Currently    Comment: rare   Drug use: Never   Sexual activity: Not Currently  Other Topics Concern   Not on file  Social History Narrative   Not on file   Social Determinants of Health   Financial Resource Strain: Not on file  Food Insecurity: Not on file  Transportation Needs: Not on file  Physical Activity: Not on file  Stress: Not on file  Social Connections: Not on file    Family History  Problem Relation Age of Onset   Heart attack Father    Mesothelioma Sister 91   Lung cancer Brother     Health Maintenance  Topic Date Due   COVID-19 Vaccine (3 - Moderna risk series) 06/22/2019   Zoster Vaccines- Shingrix (1 of 2) 11/29/2021 (Originally 07/24/1959)   Pneumonia Vaccine 59+ Years old (2 - PPSV23 if available, else PCV20) 07/25/2022 (Originally 05/02/2015)   TETANUS/TDAP  07/25/2022 (Originally 07/24/1959)   INFLUENZA VACCINE  10/29/2021   HPV VACCINES  Aged Out     ----------------------------------------------------------------------------------------------------------------------------------------------------------------------------------------------------------------- Physical Exam BP 99/65 (BP Location: Left Arm, Patient Position: Sitting, Cuff Size: Normal)   Pulse 84   Ht 6' (1.829 m)   Wt 199 lb (90.3 kg)   SpO2 97%   BMI 26.99 kg/m   Physical Exam Constitutional:      Appearance: Normal appearance.  Eyes:     General: No scleral  icterus. Cardiovascular:     Rate and Rhythm: Normal rate and regular rhythm.  Pulmonary:     Effort: Pulmonary effort is normal.     Breath sounds: Normal breath sounds.     Comments: Wearing O2 via nasal cannula.   Musculoskeletal:     Cervical back: Neck supple.  Neurological:     General: No focal deficit present.     Mental Status: He is alert.  Psychiatric:        Mood and Affect: Mood normal.        Behavior: Behavior normal.    ------------------------------------------------------------------------------------------------------------------------------------------------------------------------------------------------------------------- Assessment and Plan  HTN (hypertension) He is hypotensive however does not have symptoms related to this.  Discussed precautions when standing.   COPD (chronic obstructive pulmonary disease) ([place]) Followed by pulmonology.  Recommend continuation of current inhalers.    Small cell lung cancer ([place]) Management per oncology.  Tolerating current treatment  okay at this time.  Constipation Having some constipation after chemotherapy treatments.  We will continue regimen of Colace and MiraLAX.  Can titrate MiraLAX as needed.  Can use senna occasionally if needed.  History of urinary retention Rechecking urinalysis today.  This appears okay.   No orders of the defined types were placed in this encounter.   No follow-ups on file.    This visit occurred during the SARS-CoV-2 public health emergency.  Safety protocols were in place, including screening questions prior to the visit, additional usage of staff PPE, and extensive cleaning of exam room while observing appropriate contact time as indicated for disinfecting solutions.

## 2021-09-16 NOTE — Assessment & Plan Note (Signed)
Having some constipation after chemotherapy treatments.  We will continue regimen of Colace and MiraLAX.  Can titrate MiraLAX as needed.  Can use senna occasionally if needed.

## 2021-09-16 NOTE — Assessment & Plan Note (Signed)
Rechecking urinalysis today.  This appears okay.

## 2021-09-16 NOTE — Assessment & Plan Note (Signed)
He is hypotensive however does not have symptoms related to this.  Discussed precautions when standing.

## 2021-09-16 NOTE — Assessment & Plan Note (Signed)
Management per oncology.  Tolerating current treatment okay at this time.

## 2021-09-16 NOTE — Assessment & Plan Note (Signed)
Followed by pulmonology.  Recommend continuation of current inhalers.

## 2021-09-17 ENCOUNTER — Other Ambulatory Visit: Payer: Medicare Other

## 2021-09-17 ENCOUNTER — Ambulatory Visit: Payer: Medicare Other | Admitting: Physician Assistant

## 2021-09-17 ENCOUNTER — Ambulatory Visit: Payer: Medicare Other

## 2021-09-18 ENCOUNTER — Inpatient Hospital Stay: Payer: Medicare Other

## 2021-09-18 ENCOUNTER — Other Ambulatory Visit: Payer: Self-pay

## 2021-09-18 DIAGNOSIS — Z95828 Presence of other vascular implants and grafts: Secondary | ICD-10-CM

## 2021-09-18 DIAGNOSIS — C3411 Malignant neoplasm of upper lobe, right bronchus or lung: Secondary | ICD-10-CM

## 2021-09-18 DIAGNOSIS — Z5111 Encounter for antineoplastic chemotherapy: Secondary | ICD-10-CM | POA: Diagnosis not present

## 2021-09-18 LAB — CMP (CANCER CENTER ONLY)
ALT: 13 U/L (ref 0–44)
AST: 17 U/L (ref 15–41)
Albumin: 3.8 g/dL (ref 3.5–5.0)
Alkaline Phosphatase: 69 U/L (ref 38–126)
Anion gap: 5 (ref 5–15)
BUN: 15 mg/dL (ref 8–23)
CO2: 32 mmol/L (ref 22–32)
Calcium: 9.5 mg/dL (ref 8.9–10.3)
Chloride: 100 mmol/L (ref 98–111)
Creatinine: 0.85 mg/dL (ref 0.61–1.24)
GFR, Estimated: >60 mL/min (ref >60)
Glucose, Bld: 100 mg/dL — ABNORMAL HIGH (ref 70–99)
Potassium: 4.4 mmol/L (ref 3.5–5.1)
Sodium: 137 mmol/L (ref 135–145)
Total Bilirubin: 0.4 mg/dL (ref 0.3–1.2)
Total Protein: 6.9 g/dL (ref 6.5–8.1)

## 2021-09-18 LAB — CBC WITH DIFFERENTIAL (CANCER CENTER ONLY)
Abs Immature Granulocytes: 0 10*3/uL (ref 0.00–0.07)
Basophils Absolute: 0 10*3/uL (ref 0.0–0.1)
Basophils Relative: 1 %
Eosinophils Absolute: 0.1 10*3/uL (ref 0.0–0.5)
Eosinophils Relative: 5 %
HCT: 32.3 % — ABNORMAL LOW (ref 39.0–52.0)
Hemoglobin: 10.8 g/dL — ABNORMAL LOW (ref 13.0–17.0)
Immature Granulocytes: 0 %
Lymphocytes Relative: 25 %
Lymphs Abs: 0.6 10*3/uL — ABNORMAL LOW (ref 0.7–4.0)
MCH: 32.4 pg (ref 26.0–34.0)
MCHC: 33.4 g/dL (ref 30.0–36.0)
MCV: 97 fL (ref 80.0–100.0)
Monocytes Absolute: 0.6 10*3/uL (ref 0.1–1.0)
Monocytes Relative: 23 %
Neutro Abs: 1.2 10*3/uL — ABNORMAL LOW (ref 1.7–7.7)
Neutrophils Relative %: 46 %
Platelet Count: 154 10*3/uL (ref 150–400)
RBC: 3.33 MIL/uL — ABNORMAL LOW (ref 4.22–5.81)
RDW: 15.9 % — ABNORMAL HIGH (ref 11.5–15.5)
WBC Count: 2.4 10*3/uL — ABNORMAL LOW (ref 4.0–10.5)
nRBC: 0 % (ref 0.0–0.2)

## 2021-09-18 MED ORDER — SODIUM CHLORIDE 0.9% FLUSH
10.0000 mL | Freq: Once | INTRAVENOUS | Status: AC
  Administered 2021-09-18: 10 mL

## 2021-09-18 MED ORDER — HEPARIN SOD (PORK) LOCK FLUSH 100 UNIT/ML IV SOLN
500.0000 [IU] | Freq: Once | INTRAVENOUS | Status: AC
  Administered 2021-09-18: 500 [IU]

## 2021-09-19 ENCOUNTER — Encounter (HOSPITAL_COMMUNITY): Payer: Self-pay

## 2021-09-19 ENCOUNTER — Ambulatory Visit (HOSPITAL_COMMUNITY)
Admission: RE | Admit: 2021-09-19 | Discharge: 2021-09-19 | Disposition: A | Discharge status: Home / Self Care | Payer: Medicare Other | Source: Ambulatory Visit | Attending: Physician Assistant | Admitting: Physician Assistant

## 2021-09-19 DIAGNOSIS — C3411 Malignant neoplasm of upper lobe, right bronchus or lung: Secondary | ICD-10-CM | POA: Insufficient documentation

## 2021-09-19 MED ORDER — IOHEXOL 300 MG/ML  SOLN
100.0000 mL | Freq: Once | INTRAMUSCULAR | Status: AC | PRN
Start: 2021-09-19 — End: 2021-09-19
  Administered 2021-09-19: 100 mL via INTRAVENOUS

## 2021-09-20 NOTE — Progress Notes (Signed)
[facility] OFFICE PROGRESS NOTE  Gerald Coombe, DO [address]  [address] [place] [address]  DIAGNOSIS: Extensive stage (T2 a, N2, M1b) small cell lung cancer that was initially diagnosed as synchronous stage Ia non-small cell lung cancer, squamous cell carcinoma involving right upper lobe pulmonary nodule in addition to right lower lobe endobronchial lesion diagnosed in June 2021.  The patient has evidence for disease recurrence and metastasis in December 2022  PRIOR THERAPY:  1) SBRT to the right upper lobe pulmonary nodule as well as the right lower lobe pulmonary nodule under the care of Dr. Roselind Messier completed on 11/14/2019. 2) Palliative systemic chemotherapy with carboplatin for an AUC of 5, etoposide 100 mg/m2, and Imfinzi 1500 mg IV every 3 weeks with Cosela.  First dose expected on 04/16/21.  Status post 4 cycles.  CURRENT THERAPY: Zepzelca (lurbinectedin) 3.2 Mg/M2 every 3 weeks.  First dose July 16, 2021.  Status post 3 cycles. Dose reduced to 2.6 mg/m2 starting from cycle #2 due to pancytopenia with cycle #2.    INTERVAL HISTORY: Gerald [name] 81 y.o. male returns to the clinic today for a follow-up visit accompanied by his wife and his son.  The patient was recently found to have evidence of disease progression on imaging studies a few months ago.  Therefore, he was started chemotherapy with Zepzelca.  He had pancytopenia with cycle #1 and hospitalized for sepsis and possible multifocal pneumonia.  Therefore, starting from cycle #2, his dose was reduced a subsequent to 2.6 mg/m2 he tolerated this much better without needing Granix or any transfusion support. He also tolerated cycle #3 as well. He also received IV fluids with cycle #2 and 3 and his family believes that this has helped him a lot with tolerability.  They are requesting IV fluids with infusion today.  He does also have some persistent/ongoing mild hypotension.  Since last being seen, the patient  denies new concerns, except he has been using his supplemental oxygen a little bit more. He is currently on 2 L of oxygen. He denies any fever, chills, or recent night sweats.  He lost a  pound since last being seen but his family states that he is eating well at home. The patient does not exert himself so he denies any shortness of breath.  He may have an intermittent cough every once in a while. He denies any chest pain or hemoptysis. Denies any nausea, vomiting, or diarrhea. He has some constipation following treatment. Therefore, his wife is prepared to give him laxatives this cycle. The patient recently had a restaging CT scan performed.  He is here today for evaluation to review his scan results before starting cycle #4.   MEDICAL HISTORY: Past Medical History:  Diagnosis Date   Bradycardia    Bursitis    Carotid artery disease (HCC)    COPD (chronic obstructive pulmonary disease) (HCC)    Coronary artery disease    CTO mid-dist CX (fills from left-to-left collaterals), 99% OM2, 30% ost-dist LM, 30% prox-mid LAD, 30% ost-mid CX, 99% prox RCA (too small for PCI), medical management 02/09/20   Hypercholesterolemia    Ischemic cardiomyopathy    Lung cancer (HCC)    Lung nodule    nscl ca dx'd 08/2019   Psoriasis    Tobacco abuse    Wears glasses    reading    ALLERGIES:  is allergic to cosela [trilaciclib].  MEDICATIONS:  Current Outpatient Medications  Medication Sig Dispense Refill  albuterol (VENTOLIN HFA) 108 (90 Base) MCG/ACT inhaler Inhale 2 puffs into the lungs every 6 (six) hours as needed for wheezing or shortness of breath. 18 g 2   AMBULATORY NON FORMULARY MEDICATION Please provide 4 wheel rolling walker with seat for Gerald [name].  Diagnosis: R26.81 Gait instability 1 Device 0   aspirin EC 81 MG tablet Take 1 tablet (81 mg total) by mouth daily. Swallow whole. 90 tablet 3   clobetasol cream (TEMOVATE) 0.05 % Apply 1 application topically daily as needed (Psoriasis).      docusate sodium (COLACE) 100 MG capsule Take 200 mg by mouth daily as needed for moderate constipation.     hydrocortisone 1 % ointment Apply 1 application. topically 2 (two) times daily. To the rash. Please follow-up with your dermatologist. 30 g 0   lidocaine-prilocaine (EMLA) cream Apply to the Port-A-Cath site 30-60-minute before chemotherapy. 30 g 1   Multiple Vitamins-Minerals (PRESERVISION AREDS 2 PO) Take 1 tablet by mouth in the morning and at bedtime.     niacinamide 500 MG tablet Take 500 mg by mouth daily with breakfast.     polyethylene glycol (MIRALAX) 17 g packet Take 17 g by mouth daily as needed.     prochlorperazine (COMPAZINE) 10 MG tablet Take 1 tablet (10 mg total) by mouth every 6 (six) hours as needed. (Patient taking differently: Take 10 mg by mouth every 6 (six) hours as needed for vomiting or nausea.) 30 tablet 2   rosuvastatin (CRESTOR) 20 MG tablet Take 1 tablet (20 mg total) by mouth daily. 90 tablet 3   tamsulosin (FLOMAX) 0.4 MG CAPS capsule Take 0.4 mg by mouth.     triamcinolone cream (KENALOG) 0.1 % Apply 1 application topically daily as needed (skin irritation.).     vitamin B-12 (CYANOCOBALAMIN) 500 MCG tablet Take 500 mcg by mouth every evening.     methylcellulose oral powder Take 1 packet by mouth daily. Take 1 packet daily for constipation (Patient not taking: Reported on 09/24/2021)     No current facility-administered medications for this visit.   Facility-Administered Medications Ordered in Other Visits  Medication Dose Route Frequency Provider Last Rate Last Admin   0.9 %  sodium chloride infusion   Intravenous Once Si Gaul, MD       dexamethasone (DECADRON) 10 mg in sodium chloride 0.9 % 50 mL IVPB  10 mg Intravenous Once Si Gaul, MD       lurbinectedin [facility]) 5.6 mg in sodium chloride 0.9 % 250 mL chemo infusion  2.6 mg/m2 (Treatment Plan Recorded) Intravenous Once Si Gaul, MD       palonosetron (ALOXI) injection 0.25 mg   0.25 mg Intravenous Once Si Gaul, MD        SURGICAL HISTORY:  Past Surgical History:  Procedure Laterality Date   BRONCHIAL BIOPSY  09/27/2019   Procedure: BRONCHIAL BIOPSIES;  Surgeon: Josephine Igo, DO;  Location: [facility];  Service: Pulmonary;;   BRONCHIAL BRUSHINGS  09/27/2019   Procedure: BRONCHIAL BRUSHINGS;  Surgeon: Josephine Igo, DO;  Location: [facility];  Service: Pulmonary;;   BRONCHIAL NEEDLE ASPIRATION BIOPSY  09/27/2019   Procedure: BRONCHIAL NEEDLE ASPIRATION BIOPSIES;  Surgeon: Josephine Igo, DO;  Location: [facility];  Service: Pulmonary;;   BRONCHIAL NEEDLE ASPIRATION BIOPSY  03/29/2021   Procedure: BRONCHIAL NEEDLE ASPIRATION BIOPSIES;  Surgeon: Josephine Igo, DO;  Location: [facility];  Service: Pulmonary;;   BRONCHIAL WASHINGS  09/27/2019   Procedure: BRONCHIAL WASHINGS;  Surgeon: Audie Box  L, DO;  Location: [facility];  Service: Pulmonary;;   BRONCHIAL WASHINGS  03/29/2021   Procedure: BRONCHIAL WASHINGS;  Surgeon: Josephine Igo, DO;  Location: [facility];  Service: Pulmonary;;   COLONOSCOPY     CRYOTHERAPY  09/27/2019   Procedure: Cloyde Reams;  Surgeon: Josephine Igo, DO;  Location: [facility];  Service: Pulmonary;;   CRYOTHERAPY  03/29/2021   Procedure: CRYOTHERAPY;  Surgeon: Josephine Igo, DO;  Location: [facility];  Service: Pulmonary;;   FIDUCIAL MARKER PLACEMENT  09/27/2019   Procedure: FIDUCIAL MARKER PLACEMENT;  Surgeon: Josephine Igo, DO;  Location: [facility];  Service: Pulmonary;;   HEMOSTASIS CONTROL  09/27/2019   Procedure: HEMOSTASIS CONTROL;  Surgeon: Josephine Igo, DO;  Location: [facility];  Service: Pulmonary;;   HERNIA REPAIR     umbicial- no longer as a "belly button"   IR IMAGING GUIDED PORT INSERTION  05/21/2021   IR US GUIDE VASC ACCESS LEFT  05/21/2021   RIGHT/LEFT HEART CATH AND CORONARY ANGIOGRAPHY N/A 02/09/2020   Procedure: RIGHT/LEFT HEART CATH AND CORONARY ANGIOGRAPHY;   Surgeon: Kathleene Hazel, MD;  Location: [facility];  Service: Cardiovascular;  Laterality: N/A;   VIDEO BRONCHOSCOPY WITH ENDOBRONCHIAL NAVIGATION N/A 09/27/2019   Procedure: VIDEO BRONCHOSCOPY WITH ENDOBRONCHIAL NAVIGATION;  Surgeon: Josephine Igo, DO;  Location: [facility];  Service: Pulmonary;  Laterality: N/A;   VIDEO BRONCHOSCOPY WITH ENDOBRONCHIAL ULTRASOUND N/A 03/29/2021   Procedure: VIDEO BRONCHOSCOPY WITH ENDOBRONCHIAL ULTRASOUND;  Surgeon: Josephine Igo, DO;  Location: [facility];  Service: Pulmonary;  Laterality: N/A;    REVIEW OF SYSTEMS:   Constitutional: Negative for appetite change, chills, fatigue, fever and unexpected weight change.  HENT:   Negative for mouth sores, nosebleeds, sore throat and trouble swallowing.   Eyes: Negative for eye problems and icterus.  Respiratory: Positive for mild intermittent cough.  Negative for hemoptysis, shortness of breath and wheezing.   Cardiovascular: Negative for chest pain and leg swelling.   Gastrointestinal: Negative for abdominal pain, constipation (resolved at this time), diarrhea, nausea and vomiting.  Genitourinary: Negative for bladder incontinence, difficulty urinating, dysuria, frequency and hematuria.   Musculoskeletal: Negative for back pain, gait problem, neck pain and neck stiffness.  Skin: Negative for itching and rash.  Neurological: Negative for dizziness, extremity weakness, gait problem, headaches, light-headedness and seizures.  Hematological: Negative for adenopathy. Does not bruise/bleed easily.  Psychiatric/Behavioral: Negative for confusion, depression and sleep disturbance. The patient is not nervous/anxious.      PHYSICAL EXAMINATION:  Blood pressure (!) 94/53, pulse 85, temperature 97.7 F (36.5 C), resp. rate 18, weight 198 lb 3.2 oz (89.9 kg), SpO2 95 %.  ECOG PERFORMANCE STATUS: 1-2  Physical Exam  Constitutional: Oriented to person, place, and time and well-developed,  well-nourished, and in no distress.  HENT:  Head: Normocephalic and atraumatic.  Mouth/Throat: Oropharynx is clear and moist. No oropharyngeal exudate.  Eyes: Conjunctivae are normal. Right eye exhibits no discharge. Left eye exhibits no discharge. No scleral icterus.  Neck: Normal range of motion. Neck supple.  Cardiovascular: Normal rate, regular rhythm, normal heart sounds and intact distal pulses.   Pulmonary/Chest: Effort normal.  Quiet breath sounds in the right lung.  No respiratory distress. No wheezes. No rales.  Abdominal: Soft. Bowel sounds are normal. Exhibits no distension and no mass. There is no tenderness.  Musculoskeletal: Normal range of motion. Exhibits no edema.  Lymphadenopathy:    No cervical adenopathy.  Neurological: Alert and oriented to person, place, and  time. Exhibits normal muscle tone. Gait normal. Coordination normal.  Skin: Skin is warm and dry. No rash noted. Not diaphoretic. No erythema. No pallor.  Psychiatric: Mood, memory and judgment normal.  Vitals reviewed.    LABORATORY DATA: Lab Results  Component Value Date   WBC 5.0 09/24/2021   HGB 11.0 (L) 09/24/2021   HCT 32.9 (L) 09/24/2021   MCV 97.1 09/24/2021   PLT 262 09/24/2021      Chemistry      Component Value Date/Time   NA 136 09/24/2021 1026   NA 139 02/07/2020 1208   K 4.2 09/24/2021 1026   CL 101 09/24/2021 1026   CO2 30 09/24/2021 1026   BUN 19 09/24/2021 1026   BUN 17 02/07/2020 1208   CREATININE 0.90 09/24/2021 1026      Component Value Date/Time   CALCIUM 9.2 09/24/2021 1026   ALKPHOS 75 09/24/2021 1026   AST 21 09/24/2021 1026   ALT 15 09/24/2021 1026   BILITOT 0.3 09/24/2021 1026       RADIOGRAPHIC STUDIES:  CT Chest W Contrast  Result Date: 09/20/2021 CLINICAL DATA:  Primary Cancer Type: Lung Imaging Indication: Assess response to therapy Interval therapy since last imaging? Yes Initial Cancer Diagnosis Date: 09/27/2019; Established by: Biopsy-proven Detailed  Pathology: Stage Ia non-small cell lung cancer, squamous cell carcinoma. Primary Tumor location:  Right upper lobe and right lower lobe. Recurrence? Yes; Date(s) of recurrence: 03/29/2021; Established by: Biopsy-proven Surgeries: Endobronchial cryotherapy 09/27/2019. Umbilical hernia repair. Chemotherapy: Yes; Ongoing? Yes; Most recent administration: 09/04/2021 Immunotherapy?  Yes; Type: Imfinzi; Ongoing? No Radiation therapy? Yes; Date Range: 11/01/2019 - 11/14/2019; Target: Right lung * Tracking Code: BO * EXAM: CT CHEST, ABDOMEN, AND PELVIS WITH CONTRAST TECHNIQUE: Multidetector CT imaging of the chest, abdomen and pelvis was performed following the standard protocol during bolus administration of intravenous contrast. RADIATION DOSE REDUCTION: This exam was performed according to the departmental dose-optimization program which includes automated exposure control, adjustment of the mA and/or kV according to patient size and/or use of iterative reconstruction technique. CONTRAST:  80mL OMNIPAQUE IOHEXOL 300 MG/ML SOLN, additional oral enteric COMPARISON:  Most recent CT chest 07/26/2021. CT chest, abdomen and pelvis 07/04/2021. 03/13/2021 PET-CT. FINDINGS: CT CHEST FINDINGS Cardiovascular: Aortic atherosclerosis. Left chest port catheter. Normal heart size. Three-vessel coronary artery calcifications. No pericardial effusion. Mediastinum/Nodes: Unchanged, bulky right hilar and pretracheal lymph nodes, measuring up to 4.5 x 4.5 cm (series 2, image 21). Additional enlarged anterior mediastinal lymph node or pleural-based soft tissue nodule is unchanged measuring 3.9 x 2.7 cm (series 2, image 23). Heterogeneous nodule of the right lobe of the thyroid, not clinically significant in the setting of advanced metastatic malignancy. Interval enlargement of right-sided subpectoral lymph nodes, measuring up to 1.4 x 1.1 cm, previously 1.2 x 0.8 cm (series 2, image 17). In the setting of significant comorbidities or limited  life expectancy, no follow-up recommended (ref: J Am Coll Radiol. 2015 Feb;12(2): 143-50). Trachea, and esophagus demonstrate no significant findings. Lungs/Pleura: Unchanged post treatment appearance of the right chest with a small, loculated appearing pleural effusion, associated pleural thickening, and masslike consolidation of the dependent right lung base, with paramedian volume loss and fibrosis involving the perihilar right middle and right lower lobes. Moderate underlying centrilobular emphysema. Musculoskeletal: No chest wall mass or suspicious osseous lesions identified. CT ABDOMEN PELVIS FINDINGS Hepatobiliary: Numerous subtle, hypodense lesions are again seen throughout the liver. At least some of these appear to have slightly enlarged compared to prior examination, most notably in  the posterior right lobe of the liver, hepatic segment VII lesion measuring 2.2 x 1.9 cm, previously 1.9 x 1.7 cm (series 2, image 52), and others appear to be new compared to prior examination, for example several subcentimeter lesions in the inferior right lobe of the liver, hepatic segment VI (e.g. Series 2, image 60). Some other lesions however appear to be diminished in size, for example in the lateral right lobe of the liver, hepatic segment VII, a lesion measuring 1.2 x 1.0 cm, previously 2.2 x 1.7 cm (series 2, image 53). Tiny gallstones and or calcific sludge in the gallbladder. Gallbladder wall thickening, or biliary dilatation. Pancreas: Unremarkable. No pancreatic ductal dilatation or surrounding inflammatory changes. Spleen: Normal in size without significant abnormality. Adrenals/Urinary Tract: Adrenal glands are unremarkable. Kidneys are normal, without renal calculi, solid lesion, or hydronephrosis. Bladder is unremarkable. Stomach/Bowel: Stomach is within normal limits. Appendix appears normal. No evidence of bowel wall thickening, distention, or inflammatory changes. Pancolonic diverticulosis  Vascular/Lymphatic: Aortic atherosclerosis. Interval enlargement of portacaval lymph nodes, measuring up to 2.2 x 1.5 cm, previously 1.7 x 0.9 cm (series 2, image 56). Reproductive: No mass or other abnormality. Other: Small, fat containing right inguinal hernia. Umbilical hernia mesh repair no ascites. Musculoskeletal: No acute osseous findings. IMPRESSION: 1. Unchanged post treatment appearance of the right chest with a small, loculated appearing pleural effusion, associated pleural thickening, and masslike consolidation of the dependent right lung base, as well as paramedian volume loss and fibrosis involving the perihilar right middle and right lower lobes. 2. Unchanged, bulky right hilar and pretracheal lymph nodes, as well as an enlarged anterior mediastinal lymph node or pleural-based soft tissue nodule. 3. Numerous subtle, hypodense lesions are again seen throughout the liver. At least some of these appear to have slightly enlarged compared to prior examination, and others appear to be new. Some other lesions however appear to be diminished in size. 4. Interval enlargement of right subpectoral and portacaval lymph nodes. 5. 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 Aortic Atherosclerosis (ICD10-I70.0). Electronically Signed   By: Jearld Lesch M.D.   On: 09/20/2021 10:59   CT Abdomen Pelvis W Contrast  Result Date: 09/20/2021 CLINICAL DATA:  Primary Cancer Type: Lung Imaging Indication: Assess response to therapy Interval therapy since last imaging? Yes Initial Cancer Diagnosis Date: 09/27/2019; Established by: Biopsy-proven Detailed Pathology: Stage Ia non-small cell lung cancer, squamous cell carcinoma. Primary Tumor location:  Right upper lobe and right lower lobe. Recurrence? Yes; Date(s) of recurrence: 03/29/2021; Established by:  Biopsy-proven Surgeries: Endobronchial cryotherapy 09/27/2019. Umbilical hernia repair. Chemotherapy: Yes; Ongoing? Yes; Most recent administration: 09/04/2021 Immunotherapy?  Yes; Type: Imfinzi; Ongoing? No Radiation therapy? Yes; Date Range: 11/01/2019 - 11/14/2019; Target: Right lung * Tracking Code: BO * EXAM: CT CHEST, ABDOMEN, AND PELVIS WITH CONTRAST TECHNIQUE: Multidetector CT imaging of the chest, abdomen and pelvis was performed following the standard protocol during bolus administration of intravenous contrast. RADIATION DOSE REDUCTION: This exam was performed according to the departmental dose-optimization program which includes automated exposure control, adjustment of the mA and/or kV according to patient size and/or use of iterative reconstruction technique. CONTRAST:  80mL OMNIPAQUE IOHEXOL 300 MG/ML SOLN, additional oral enteric COMPARISON:  Most recent CT chest 07/26/2021. CT chest, abdomen and pelvis 07/04/2021. 03/13/2021 PET-CT. FINDINGS: CT CHEST FINDINGS Cardiovascular: Aortic atherosclerosis. Left chest port catheter. Normal heart size. Three-vessel coronary artery  calcifications. No pericardial effusion. Mediastinum/Nodes: Unchanged, bulky right hilar and pretracheal lymph nodes, measuring up to 4.5 x 4.5 cm (series 2, image 21). Additional enlarged anterior mediastinal lymph node or pleural-based soft tissue nodule is unchanged measuring 3.9 x 2.7 cm (series 2, image 23). Heterogeneous nodule of the right lobe of the thyroid, not clinically significant in the setting of advanced metastatic malignancy. Interval enlargement of right-sided subpectoral lymph nodes, measuring up to 1.4 x 1.1 cm, previously 1.2 x 0.8 cm (series 2, image 17). In the setting of significant comorbidities or limited life expectancy, no follow-up recommended (ref: J Am Coll Radiol. 2015 Feb;12(2): 143-50). Trachea, and esophagus demonstrate no significant findings. Lungs/Pleura: Unchanged post treatment appearance of  the right chest with a small, loculated appearing pleural effusion, associated pleural thickening, and masslike consolidation of the dependent right lung base, with paramedian volume loss and fibrosis involving the perihilar right middle and right lower lobes. Moderate underlying centrilobular emphysema. Musculoskeletal: No chest wall mass or suspicious osseous lesions identified. CT ABDOMEN PELVIS FINDINGS Hepatobiliary: Numerous subtle, hypodense lesions are again seen throughout the liver. At least some of these appear to have slightly enlarged compared to prior examination, most notably in the posterior right lobe of the liver, hepatic segment VII lesion measuring 2.2 x 1.9 cm, previously 1.9 x 1.7 cm (series 2, image 52), and others appear to be new compared to prior examination, for example several subcentimeter lesions in the inferior right lobe of the liver, hepatic segment VI (e.g. Series 2, image 60). Some other lesions however appear to be diminished in size, for example in the lateral right lobe of the liver, hepatic segment VII, a lesion measuring 1.2 x 1.0 cm, previously 2.2 x 1.7 cm (series 2, image 53). Tiny gallstones and or calcific sludge in the gallbladder. Gallbladder wall thickening, or biliary dilatation. Pancreas: Unremarkable. No pancreatic ductal dilatation or surrounding inflammatory changes. Spleen: Normal in size without significant abnormality. Adrenals/Urinary Tract: Adrenal glands are unremarkable. Kidneys are normal, without renal calculi, solid lesion, or hydronephrosis. Bladder is unremarkable. Stomach/Bowel: Stomach is within normal limits. Appendix appears normal. No evidence of bowel wall thickening, distention, or inflammatory changes. Pancolonic diverticulosis Vascular/Lymphatic: Aortic atherosclerosis. Interval enlargement of portacaval lymph nodes, measuring up to 2.2 x 1.5 cm, previously 1.7 x 0.9 cm (series 2, image 56). Reproductive: No mass or other abnormality. Other:  Small, fat containing right inguinal hernia. Umbilical hernia mesh repair no ascites. Musculoskeletal: No acute osseous findings. IMPRESSION: 1. Unchanged post treatment appearance of the right chest with a small, loculated appearing pleural effusion, associated pleural thickening, and masslike consolidation of the dependent right lung base, as well as paramedian volume loss and fibrosis involving the perihilar right middle and right lower lobes. 2. Unchanged, bulky right hilar and pretracheal lymph nodes, as well as an enlarged anterior mediastinal lymph node or pleural-based soft tissue nodule. 3. Numerous subtle, hypodense lesions are again seen throughout the liver. At least some of these appear to have slightly enlarged compared to prior examination, and others appear to be new. Some other lesions however appear to be diminished in size. 4. Interval enlargement of right subpectoral and portacaval lymph nodes. 5. 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 Aortic Atherosclerosis (ICD10-I70.0). Electronically Signed   By: Jearld Lesch M.D.   On: 09/20/2021 10:59     ASSESSMENT/PLAN:  This is a  very pleasant 81 year old Caucasian male recently diagnosed with    1) extensive stage (T2 a, N2, M1 B) small cell lung cancer diagnosed in January 2022. Presented with new masslike area in the medial aspect of the right upper lobe with apparent invasion of the mediastinum and bulky right paratracheal lymphadenopathy in addition to liver lesions. The patient underwent systemic chemotherapy with carboplatin for AUC of 5 on day 1, etoposide 100 Mg/M2 on days 1, 2 and 3 with Cosela 240 Mg/M2 before chemotherapy as well as Imfinzi 1500 Mg IV on day 1 every 3 weeks.  Status post 4 cycles. This was discontinued due to evidence for disease progression in  the lung and liver.  The patient is currently undergoing second line treatment with Zepzelca (lurbinectedin) 3.2 Mg/M2 every 3 weeks status post 3 cycles.  He was hospitalized following cycle #1 for fever, weakness, neutropenia, and multifocal pneumonia.  Doses of cycle was reduced starting from cycle #2 at 2.6 mg/ metered square.  The patient tolerated cycle #2 and 3 much better.   The patient recently had a restaging CT scan performed.  Dr. Arbutus Ped personally and independently reviewed the scan and discussed the results with the patient today.  The scan showed mixed response to treatment with stable appearance of the chest and loculated pleural effusion in the dependent right lung base.  The right hilar and paratracheal lymph nodes are also stable but there may be a slightly enlarged anterior mediastinal lymph node or pleural-based soft tissue nodule.  For the numerous lesions in the liver, some of the lesions appear to be diminished in size while some may be slightly enlarged and others may be new.  Dr. Arbutus Ped reviewed the results with the patient and his wife and son today.  Dr. Arbutus Ped discussed the mixed response to treatment and the lack of any good additional options, Dr. Arbutus Ped recommends continuing on the same treatment for now with close monitoring with a restaging scan in 2-3 cycles.  Dr. Arbutus Ped discussed that if the patient has continued disease progression in the future, he would likely recommend palliative care/hospice.  Labs were reviewed.  He will proceed with cycle #4 today's schedule.  I will arrange for IV fluid to be given with his treatment today.  The patient's wife is going to stay on top of his constipation with laxatives as he appears to get constipated following chemotherapy routinely.  We will see him back for follow-up visit in 3 weeks for evaluation and repeat blood work before starting cycle #5.  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 Roselind Messier. He was advised to call immediately if he has any other concerning symptoms in the interval.    The patient was advised to call immediately if he has any concerning symptoms in the interval. The patient voices understanding of current disease status and treatment options and is in agreement with the current care plan. All questions were answered. The patient knows to call the clinic with any problems, questions or concerns. We can certainly see the patient much sooner if necessary   No orders of the defined types were placed in this encounter.      Deforrest Bogle L Ladesha Pacini, PA-C 09/24/21  ADDENDUM: Hematology/Oncology Attending: I had a face-to-face encounter with the patient today.  I reviewed  his record, lab, scan and recommended his care plan.  This is a very pleasant 81 years old white male diagnosed with extensive stage small cell lung cancer initially was diagnosed with synchronous stage Ia non-small cell lung cancer involving the right upper lobe and right lower lobe in June 2021 and he was diagnosed with the metastatic small cell lung cancer in December 2022.  The patient started systemic chemotherapy with carboplatin, etoposide and Imfinzi status post 4 cycles discontinued secondary to disease progression. He is currently on treatment with Zepzelca (lurbinectedin) at a reduced dose of 2.6 Mg/M2 starting from cycle #2 secondary to pancytopenia on the first cycle of his treatment.  He is status post 3 cycles of chemotherapy and has been tolerating it much better.  He has no concerning complaints except for the baseline shortness of breath and he is currently on home oxygen.  He also has mild fatigue. The patient had repeat CT scan of the chest, abdomen and pelvis performed recently.  I  personally and independently reviewed the scan images and discussed the result with the patient and his wife and son who are available in the room. His scan showed mixed response to the treatment with improvement of some of the liver lesions and slight increase of others.  There was an unchanged appearance of the right chest and unchanged bulky right hilar and pretracheal lymph nodes. I had a lengthy discussion with the patient and his family about his current condition and treatment options. I gave him the option of palliative care versus continuing with the current treatment in the absence of any other good option for the small cell lung cancer after the second line treatment with zepzelca. The patient and his family would like to continue treatment as planned and he will proceed with cycle #4 today. He will come back for follow-up visit in 3 weeks for evaluation before the next cycle of his treatment. If he has any further progression on this regimen, will consider The patient for palliative care and hospice. He was advised to call immediately if he has any other concerning symptoms in the interval. The total time spent in the appointment was 30 minutes. Disclaimer: This note was dictated with voice recognition software. Similar sounding words can inadvertently be transcribed and may be missed upon review. Lajuana Matte, MD

## 2021-09-23 ENCOUNTER — Encounter: Payer: Self-pay | Admitting: *Deleted

## 2021-09-24 ENCOUNTER — Encounter: Payer: Self-pay | Admitting: Physician Assistant

## 2021-09-24 ENCOUNTER — Other Ambulatory Visit: Payer: Medicare Other

## 2021-09-24 ENCOUNTER — Inpatient Hospital Stay: Payer: Medicare Other

## 2021-09-24 ENCOUNTER — Inpatient Hospital Stay (HOSPITAL_BASED_OUTPATIENT_CLINIC_OR_DEPARTMENT_OTHER): Payer: Medicare Other | Admitting: Physician Assistant

## 2021-09-24 ENCOUNTER — Other Ambulatory Visit: Payer: Self-pay

## 2021-09-24 VITALS — BP 94/53 | HR 85 | Temp 97.7°F | Resp 18 | Wt 198.2 lb

## 2021-09-24 DIAGNOSIS — Z95828 Presence of other vascular implants and grafts: Secondary | ICD-10-CM

## 2021-09-24 DIAGNOSIS — C3411 Malignant neoplasm of upper lobe, right bronchus or lung: Secondary | ICD-10-CM | POA: Diagnosis present

## 2021-09-24 DIAGNOSIS — C3431 Malignant neoplasm of lower lobe, right bronchus or lung: Secondary | ICD-10-CM

## 2021-09-24 DIAGNOSIS — Z5111 Encounter for antineoplastic chemotherapy: Secondary | ICD-10-CM | POA: Diagnosis not present

## 2021-09-24 DIAGNOSIS — J189 Pneumonia, unspecified organism: Secondary | ICD-10-CM | POA: Diagnosis not present

## 2021-09-24 DIAGNOSIS — I959 Hypotension, unspecified: Secondary | ICD-10-CM

## 2021-09-24 DIAGNOSIS — Z87891 Personal history of nicotine dependence: Secondary | ICD-10-CM | POA: Diagnosis not present

## 2021-09-24 DIAGNOSIS — Z79899 Other long term (current) drug therapy: Secondary | ICD-10-CM | POA: Diagnosis not present

## 2021-09-24 DIAGNOSIS — K5900 Constipation, unspecified: Secondary | ICD-10-CM | POA: Diagnosis not present

## 2021-09-24 DIAGNOSIS — A419 Sepsis, unspecified organism: Secondary | ICD-10-CM | POA: Diagnosis not present

## 2021-09-24 LAB — CMP (CANCER CENTER ONLY)
ALT: 15 U/L (ref 0–44)
AST: 21 U/L (ref 15–41)
Albumin: 3.7 g/dL (ref 3.5–5.0)
Alkaline Phosphatase: 75 U/L (ref 38–126)
Anion gap: 5 (ref 5–15)
BUN: 19 mg/dL (ref 8–23)
CO2: 30 mmol/L (ref 22–32)
Calcium: 9.2 mg/dL (ref 8.9–10.3)
Chloride: 101 mmol/L (ref 98–111)
Creatinine: 0.9 mg/dL (ref 0.61–1.24)
GFR, Estimated: >60 mL/min (ref >60)
Glucose, Bld: 98 mg/dL (ref 70–99)
Potassium: 4.2 mmol/L (ref 3.5–5.1)
Sodium: 136 mmol/L (ref 135–145)
Total Bilirubin: 0.3 mg/dL (ref 0.3–1.2)
Total Protein: 6.6 g/dL (ref 6.5–8.1)

## 2021-09-24 LAB — CBC WITH DIFFERENTIAL (CANCER CENTER ONLY)
Abs Immature Granulocytes: 0.07 10*3/uL (ref 0.00–0.07)
Basophils Absolute: 0.1 10*3/uL (ref 0.0–0.1)
Basophils Relative: 1 %
Eosinophils Absolute: 0.1 10*3/uL (ref 0.0–0.5)
Eosinophils Relative: 2 %
HCT: 32.9 % — ABNORMAL LOW (ref 39.0–52.0)
Hemoglobin: 11 g/dL — ABNORMAL LOW (ref 13.0–17.0)
Immature Granulocytes: 1 %
Lymphocytes Relative: 16 %
Lymphs Abs: 0.8 10*3/uL (ref 0.7–4.0)
MCH: 32.4 pg (ref 26.0–34.0)
MCHC: 33.4 g/dL (ref 30.0–36.0)
MCV: 97.1 fL (ref 80.0–100.0)
Monocytes Absolute: 1.1 10*3/uL — ABNORMAL HIGH (ref 0.1–1.0)
Monocytes Relative: 22 %
Neutro Abs: 2.8 10*3/uL (ref 1.7–7.7)
Neutrophils Relative %: 58 %
Platelet Count: 262 10*3/uL (ref 150–400)
RBC: 3.39 MIL/uL — ABNORMAL LOW (ref 4.22–5.81)
RDW: 15.8 % — ABNORMAL HIGH (ref 11.5–15.5)
WBC Count: 5 10*3/uL (ref 4.0–10.5)
nRBC: 0 % (ref 0.0–0.2)

## 2021-09-24 MED ORDER — SODIUM CHLORIDE 0.9 % IV SOLN: Freq: Once | INTRAVENOUS | Status: AC

## 2021-09-24 MED ORDER — PALONOSETRON HCL INJECTION 0.25 MG/5ML
0.2500 mg | Freq: Once | INTRAVENOUS | Status: AC
  Administered 2021-09-24: 0.25 mg via INTRAVENOUS
  Filled 2021-09-24: qty 5

## 2021-09-24 MED ORDER — SODIUM CHLORIDE 0.9 % IV SOLN
2.6000 mg/m2 | Freq: Once | INTRAVENOUS | Status: AC
  Administered 2021-09-24: 5.6 mg via INTRAVENOUS
  Filled 2021-09-24: qty 11.2

## 2021-09-24 MED ORDER — SODIUM CHLORIDE 0.9 % IV SOLN
10.0000 mg | Freq: Once | INTRAVENOUS | Status: AC
  Administered 2021-09-24: 10 mg via INTRAVENOUS
  Filled 2021-09-24: qty 10

## 2021-09-24 MED ORDER — SODIUM CHLORIDE 0.9% FLUSH
10.0000 mL | INTRAVENOUS | Status: DC | PRN
  Administered 2021-09-24: 10 mL

## 2021-09-24 MED ORDER — SODIUM CHLORIDE 0.9% FLUSH
10.0000 mL | Freq: Once | INTRAVENOUS | Status: AC
  Administered 2021-09-24: 10 mL

## 2021-09-24 MED ORDER — HEPARIN SOD (PORK) LOCK FLUSH 100 UNIT/ML IV SOLN
500.0000 [IU] | Freq: Once | INTRAVENOUS | Status: AC | PRN
  Administered 2021-09-24: 500 [IU]

## 2021-09-27 ENCOUNTER — Telehealth: Payer: Self-pay | Admitting: Cardiology

## 2021-09-27 ENCOUNTER — Inpatient Hospital Stay (HOSPITAL_COMMUNITY)
Admission: EM | Admit: 2021-09-27 | Discharge: 2021-10-03 | DRG: 871 | Disposition: A | Discharge status: Home Health | Payer: Medicare Other | Attending: Internal Medicine | Admitting: Internal Medicine

## 2021-09-27 ENCOUNTER — Emergency Department (HOSPITAL_COMMUNITY): Payer: Medicare Other

## 2021-09-27 ENCOUNTER — Other Ambulatory Visit: Payer: Self-pay

## 2021-09-27 ENCOUNTER — Encounter (HOSPITAL_COMMUNITY): Payer: Self-pay

## 2021-09-27 DIAGNOSIS — J9811 Atelectasis: Secondary | ICD-10-CM | POA: Diagnosis present

## 2021-09-27 DIAGNOSIS — Z87891 Personal history of nicotine dependence: Secondary | ICD-10-CM | POA: Diagnosis not present

## 2021-09-27 DIAGNOSIS — R8271 Bacteriuria: Secondary | ICD-10-CM | POA: Diagnosis present

## 2021-09-27 DIAGNOSIS — E871 Hypo-osmolality and hyponatremia: Secondary | ICD-10-CM | POA: Diagnosis present

## 2021-09-27 DIAGNOSIS — Z801 Family history of malignant neoplasm of trachea, bronchus and lung: Secondary | ICD-10-CM

## 2021-09-27 DIAGNOSIS — C3491 Malignant neoplasm of unspecified part of right bronchus or lung: Secondary | ICD-10-CM | POA: Diagnosis present

## 2021-09-27 DIAGNOSIS — I2510 Atherosclerotic heart disease of native coronary artery without angina pectoris: Secondary | ICD-10-CM | POA: Diagnosis present

## 2021-09-27 DIAGNOSIS — D649 Anemia, unspecified: Secondary | ICD-10-CM | POA: Diagnosis present

## 2021-09-27 DIAGNOSIS — Z7982 Long term (current) use of aspirin: Secondary | ICD-10-CM

## 2021-09-27 DIAGNOSIS — Z20822 Contact with and (suspected) exposure to covid-19: Secondary | ICD-10-CM | POA: Diagnosis present

## 2021-09-27 DIAGNOSIS — Z8249 Family history of ischemic heart disease and other diseases of the circulatory system: Secondary | ICD-10-CM | POA: Diagnosis not present

## 2021-09-27 DIAGNOSIS — Z9221 Personal history of antineoplastic chemotherapy: Secondary | ICD-10-CM | POA: Diagnosis not present

## 2021-09-27 DIAGNOSIS — A419 Sepsis, unspecified organism: Principal | ICD-10-CM | POA: Diagnosis present

## 2021-09-27 DIAGNOSIS — J9621 Acute and chronic respiratory failure with hypoxia: Secondary | ICD-10-CM | POA: Diagnosis present

## 2021-09-27 DIAGNOSIS — C3490 Malignant neoplasm of unspecified part of unspecified bronchus or lung: Secondary | ICD-10-CM | POA: Diagnosis not present

## 2021-09-27 DIAGNOSIS — N400 Enlarged prostate without lower urinary tract symptoms: Secondary | ICD-10-CM | POA: Diagnosis present

## 2021-09-27 DIAGNOSIS — J9611 Chronic respiratory failure with hypoxia: Secondary | ICD-10-CM | POA: Diagnosis not present

## 2021-09-27 DIAGNOSIS — E785 Hyperlipidemia, unspecified: Secondary | ICD-10-CM | POA: Diagnosis present

## 2021-09-27 DIAGNOSIS — Z9981 Dependence on supplemental oxygen: Secondary | ICD-10-CM | POA: Diagnosis not present

## 2021-09-27 DIAGNOSIS — C787 Secondary malignant neoplasm of liver and intrahepatic bile duct: Secondary | ICD-10-CM | POA: Diagnosis present

## 2021-09-27 DIAGNOSIS — E7800 Pure hypercholesterolemia, unspecified: Secondary | ICD-10-CM | POA: Diagnosis present

## 2021-09-27 DIAGNOSIS — I10 Essential (primary) hypertension: Secondary | ICD-10-CM | POA: Diagnosis present

## 2021-09-27 DIAGNOSIS — Z79899 Other long term (current) drug therapy: Secondary | ICD-10-CM

## 2021-09-27 DIAGNOSIS — R339 Retention of urine, unspecified: Secondary | ICD-10-CM | POA: Diagnosis present

## 2021-09-27 DIAGNOSIS — L8990 Pressure ulcer of unspecified site, unspecified stage: Secondary | ICD-10-CM | POA: Insufficient documentation

## 2021-09-27 DIAGNOSIS — J189 Pneumonia, unspecified organism: Secondary | ICD-10-CM | POA: Diagnosis present

## 2021-09-27 LAB — CREATININE, SERUM
Creatinine, Ser: 1.12 mg/dL (ref 0.61–1.24)
GFR, Estimated: >60 mL/min (ref >60)

## 2021-09-27 LAB — CBC
HCT: 29.1 % — ABNORMAL LOW (ref 39.0–52.0)
Hemoglobin: 9.4 g/dL — ABNORMAL LOW (ref 13.0–17.0)
MCH: 32 pg (ref 26.0–34.0)
MCHC: 32.3 g/dL (ref 30.0–36.0)
MCV: 99 fL (ref 80.0–100.0)
Platelets: 200 10*3/uL (ref 150–400)
RBC: 2.94 MIL/uL — ABNORMAL LOW (ref 4.22–5.81)
RDW: 15.9 % — ABNORMAL HIGH (ref 11.5–15.5)
WBC: 10.6 10*3/uL — ABNORMAL HIGH (ref 4.0–10.5)
nRBC: 0 % (ref 0.0–0.2)

## 2021-09-27 LAB — CBC WITH DIFFERENTIAL/PLATELET
Abs Immature Granulocytes: 0.05 10*3/uL (ref 0.00–0.07)
Basophils Absolute: 0 10*3/uL (ref 0.0–0.1)
Basophils Relative: 0 %
Eosinophils Absolute: 0 10*3/uL (ref 0.0–0.5)
Eosinophils Relative: 0 %
HCT: 32 % — ABNORMAL LOW (ref 39.0–52.0)
Hemoglobin: 10.4 g/dL — ABNORMAL LOW (ref 13.0–17.0)
Immature Granulocytes: 0 %
Lymphocytes Relative: 6 %
Lymphs Abs: 0.8 10*3/uL (ref 0.7–4.0)
MCH: 32.1 pg (ref 26.0–34.0)
MCHC: 32.5 g/dL (ref 30.0–36.0)
MCV: 98.8 fL (ref 80.0–100.0)
Monocytes Absolute: 0.6 10*3/uL (ref 0.1–1.0)
Monocytes Relative: 5 %
Neutro Abs: 10.4 10*3/uL — ABNORMAL HIGH (ref 1.7–7.7)
Neutrophils Relative %: 89 %
Platelets: 242 10*3/uL (ref 150–400)
RBC: 3.24 MIL/uL — ABNORMAL LOW (ref 4.22–5.81)
RDW: 15.9 % — ABNORMAL HIGH (ref 11.5–15.5)
WBC: 11.8 10*3/uL — ABNORMAL HIGH (ref 4.0–10.5)
nRBC: 0 % (ref 0.0–0.2)

## 2021-09-27 LAB — URINALYSIS, ROUTINE W REFLEX MICROSCOPIC
Bilirubin Urine: NEGATIVE
Glucose, UA: NEGATIVE mg/dL
Hgb urine dipstick: NEGATIVE
Ketones, ur: NEGATIVE mg/dL
Leukocytes,Ua: NEGATIVE
Nitrite: NEGATIVE
Protein, ur: NEGATIVE mg/dL
Specific Gravity, Urine: 1.018 (ref 1.005–1.030)
pH: 6 (ref 5.0–8.0)

## 2021-09-27 LAB — COMPREHENSIVE METABOLIC PANEL
ALT: 39 U/L (ref 0–44)
AST: 41 U/L (ref 15–41)
Albumin: 3.2 g/dL — ABNORMAL LOW (ref 3.5–5.0)
Alkaline Phosphatase: 72 U/L (ref 38–126)
Anion gap: 7 (ref 5–15)
BUN: 22 mg/dL (ref 8–23)
CO2: 27 mmol/L (ref 22–32)
Calcium: 8.7 mg/dL — ABNORMAL LOW (ref 8.9–10.3)
Chloride: 100 mmol/L (ref 98–111)
Creatinine, Ser: 1.11 mg/dL (ref 0.61–1.24)
GFR, Estimated: >60 mL/min (ref >60)
Glucose, Bld: 111 mg/dL — ABNORMAL HIGH (ref 70–99)
Potassium: 4.4 mmol/L (ref 3.5–5.1)
Sodium: 134 mmol/L — ABNORMAL LOW (ref 135–145)
Total Bilirubin: 0.9 mg/dL (ref 0.3–1.2)
Total Protein: 6.4 g/dL — ABNORMAL LOW (ref 6.5–8.1)

## 2021-09-27 LAB — HIV ANTIBODY (ROUTINE TESTING W REFLEX): HIV Screen 4th Generation wRfx: NONREACTIVE

## 2021-09-27 LAB — PROTIME-INR
INR: 1.3 — ABNORMAL HIGH (ref 0.8–1.2)
Prothrombin Time: 16.4 seconds — ABNORMAL HIGH (ref 11.4–15.2)

## 2021-09-27 LAB — PROCALCITONIN: Procalcitonin: 0.36 ng/mL

## 2021-09-27 LAB — STREP PNEUMONIAE URINARY ANTIGEN: Strep Pneumo Urinary Antigen: NEGATIVE

## 2021-09-27 LAB — LACTIC ACID, PLASMA: Lactic Acid, Venous: 1.3 mmol/L (ref 0.5–1.9)

## 2021-09-27 LAB — APTT: aPTT: >200 seconds (ref 24–36)

## 2021-09-27 MED ORDER — POLYETHYLENE GLYCOL 3350 17 G PO PACK
17.0000 g | PACK | Freq: Every day | ORAL | Status: DC | PRN
  Administered 2021-09-28: 17 g via ORAL
  Filled 2021-09-27: qty 1

## 2021-09-27 MED ORDER — SODIUM CHLORIDE 0.9 % IV SOLN
INTRAVENOUS | Status: DC
Start: 2021-09-27 — End: 2021-09-28

## 2021-09-27 MED ORDER — AZITHROMYCIN 250 MG PO TABS
500.0000 mg | ORAL_TABLET | Freq: Every day | ORAL | Status: AC
  Administered 2021-09-28 – 2021-10-02 (×5): 500 mg via ORAL
  Filled 2021-09-27 (×5): qty 2

## 2021-09-27 MED ORDER — LACTATED RINGERS IV SOLN: INTRAVENOUS | Status: DC

## 2021-09-27 MED ORDER — SODIUM CHLORIDE 0.9 % IV SOLN
2.0000 g | INTRAVENOUS | Status: AC
  Administered 2021-09-28 – 2021-10-02 (×5): 2 g via INTRAVENOUS
  Filled 2021-09-27 (×5): qty 20

## 2021-09-27 MED ORDER — ENOXAPARIN SODIUM 40 MG/0.4ML IJ SOSY
40.0000 mg | PREFILLED_SYRINGE | INTRAMUSCULAR | Status: DC
  Administered 2021-09-27 – 2021-10-02 (×6): 40 mg via SUBCUTANEOUS
  Filled 2021-09-27 (×6): qty 0.4

## 2021-09-27 MED ORDER — LACTATED RINGERS IV BOLUS
1000.0000 mL | Freq: Once | INTRAVENOUS | Status: AC
  Administered 2021-09-27: 1000 mL via INTRAVENOUS

## 2021-09-27 MED ORDER — ROSUVASTATIN CALCIUM 20 MG PO TABS
20.0000 mg | ORAL_TABLET | Freq: Every day | ORAL | Status: DC
  Administered 2021-09-27 – 2021-10-03 (×7): 20 mg via ORAL
  Filled 2021-09-27 (×7): qty 1

## 2021-09-27 MED ORDER — TAMSULOSIN HCL 0.4 MG PO CAPS
0.4000 mg | ORAL_CAPSULE | Freq: Every evening | ORAL | Status: DC
  Administered 2021-09-27 – 2021-10-02 (×6): 0.4 mg via ORAL
  Filled 2021-09-27 (×6): qty 1

## 2021-09-27 MED ORDER — VITAMIN B-12 1000 MCG PO TABS
500.0000 ug | ORAL_TABLET | Freq: Every evening | ORAL | Status: DC
  Administered 2021-09-27 – 2021-10-02 (×6): 500 ug via ORAL
  Filled 2021-09-27 (×8): qty 1

## 2021-09-27 MED ORDER — DOCUSATE SODIUM 100 MG PO CAPS
200.0000 mg | ORAL_CAPSULE | Freq: Every day | ORAL | Status: DC
  Administered 2021-09-28 – 2021-10-03 (×6): 200 mg via ORAL
  Filled 2021-09-27 (×6): qty 2

## 2021-09-27 MED ORDER — ASPIRIN 81 MG PO TBEC
81.0000 mg | DELAYED_RELEASE_TABLET | Freq: Every day | ORAL | Status: DC
  Administered 2021-09-28 – 2021-10-03 (×6): 81 mg via ORAL
  Filled 2021-09-27 (×6): qty 1

## 2021-09-27 MED ORDER — SODIUM CHLORIDE 0.9 % IV SOLN
1.0000 g | INTRAVENOUS | Status: DC
  Administered 2021-09-27: 1 g via INTRAVENOUS
  Filled 2021-09-27: qty 10

## 2021-09-27 MED ORDER — FLUTICASONE FUROATE-VILANTEROL 100-25 MCG/ACT IN AEPB
1.0000 | INHALATION_SPRAY | Freq: Every day | RESPIRATORY_TRACT | Status: DC
  Filled 2021-09-27: qty 28

## 2021-09-27 MED ORDER — UMECLIDINIUM BROMIDE 62.5 MCG/ACT IN AEPB
1.0000 | INHALATION_SPRAY | Freq: Every day | RESPIRATORY_TRACT | Status: DC
  Filled 2021-09-27: qty 7

## 2021-09-27 MED ORDER — ACETAMINOPHEN 325 MG PO TABS
650.0000 mg | ORAL_TABLET | Freq: Once | ORAL | Status: AC
  Administered 2021-09-27: 650 mg via ORAL
  Filled 2021-09-27: qty 2

## 2021-09-27 MED ORDER — SODIUM CHLORIDE 0.9 % IV SOLN
500.0000 mg | INTRAVENOUS | Status: DC
  Administered 2021-09-27: 500 mg via INTRAVENOUS
  Filled 2021-09-27: qty 5

## 2021-09-27 NOTE — H&P (Signed)
History and Physical    Gerald [name] DGU:[id number] DOB: 1940/11/30 DOA: 09/27/2021  PCP: [name], DO  Patient coming from: Home  I have personally briefly reviewed patient's old medical records in [place]  Chief Complaint: Poor appetite and generalized weakness  HPI: Gerald [name] is a 81 y.o. male with medical history significant of COPD, small cell lung cancer, hypertension, hyperlipidemia, urinary retention, coronary artery disease, obesity present here with complaining of poor appetite and generalized weakness that this started last night.  As per patient's wife at the bedside: Patient always gets pneumonia whenever he gets the chemotherapy.  Last chemo was done on Tuesday.  He started having poor appetite and weak, no energy, chills and fever all 101 today therefore wife brought him to the ER for further evaluation and management.  He has chronic cough and sputum production.  He is on 2 L of oxygen via nasal cannulae at home at baseline.  He denies headache, blurry vision, chest pain, wheezing, nausea, vomiting, diarrhea, abdominal pain, recent sick contact.  ED Course: Upon arrival to ED: Patient had temperature of 100.6, tachycardic at 111, respiratory rate 18, oxygen saturation 96% on 2 L, COVID and flu pending.  CBC shows leukocytosis of 11.8, H&H 10.4/32.0, platelet: 242.  Sodium 134, creatinine 1.1, GFR: More than 60.  Chest x-ray shows progressive airspace opacity within the right lung base concerning for infection.  Patient was given IV fluids and Rocephin and azithromycin in ED.  [facility] hospitalist consulted for admission for pneumonia.  Review of Systems: As per HPI otherwise negative.    Past Medical History:  Diagnosis Date   Bradycardia    Bursitis    Carotid artery disease (HCC)    COPD (chronic obstructive pulmonary disease) (HCC)    Coronary artery disease    CTO mid-dist CX (fills from left-to-left collaterals), 99% OM2, 30% ost-dist LM, 30% prox-mid  LAD, 30% ost-mid CX, 99% prox RCA (too small for PCI), medical management 02/09/20   Hypercholesterolemia    Ischemic cardiomyopathy    Lung cancer ([place])    Lung nodule    nscl ca dx'd 08/2019   Psoriasis    Tobacco abuse    Wears glasses    reading    Past Surgical History:  Procedure Laterality Date   BRONCHIAL BIOPSY  09/27/2019   Procedure: BRONCHIAL BIOPSIES;  Surgeon: Garner Nash, DO;  Location: [facility];  Service: Pulmonary;;   BRONCHIAL BRUSHINGS  09/27/2019   Procedure: BRONCHIAL BRUSHINGS;  Surgeon: Garner Nash, DO;  Location: [facility];  Service: Pulmonary;;   BRONCHIAL NEEDLE ASPIRATION BIOPSY  09/27/2019   Procedure: BRONCHIAL NEEDLE ASPIRATION BIOPSIES;  Surgeon: Garner Nash, DO;  Location: [facility];  Service: Pulmonary;;   BRONCHIAL NEEDLE ASPIRATION BIOPSY  03/29/2021   Procedure: BRONCHIAL NEEDLE ASPIRATION BIOPSIES;  Surgeon: Garner Nash, DO;  Location: [facility];  Service: Pulmonary;;   BRONCHIAL WASHINGS  09/27/2019   Procedure: BRONCHIAL WASHINGS;  Surgeon: Garner Nash, DO;  Location: [facility];  Service: Pulmonary;;   BRONCHIAL WASHINGS  03/29/2021   Procedure: BRONCHIAL WASHINGS;  Surgeon: Garner Nash, DO;  Location: [facility];  Service: Pulmonary;;   COLONOSCOPY     CRYOTHERAPY  09/27/2019   Procedure: Cydney Ok;  Surgeon: Garner Nash, DO;  Location: [facility];  Service: Pulmonary;;   CRYOTHERAPY  03/29/2021   Procedure: CRYOTHERAPY;  Surgeon: Garner Nash, DO;  Location: [facility];  Service: Pulmonary;;   FIDUCIAL MARKER PLACEMENT  09/27/2019   Procedure: FIDUCIAL MARKER PLACEMENT;  Surgeon: Garner Nash, DO;  Location: [facility];  Service: Pulmonary;;   HEMOSTASIS CONTROL  09/27/2019   Procedure: HEMOSTASIS CONTROL;  Surgeon: Garner Nash, DO;  Location: [facility];  Service: Pulmonary;;   HERNIA REPAIR     umbicial- no longer as a "belly button"   IR IMAGING GUIDED PORT  INSERTION  05/21/2021   IR US GUIDE VASC ACCESS LEFT  05/21/2021   RIGHT/LEFT HEART CATH AND CORONARY ANGIOGRAPHY N/A 02/09/2020   Procedure: RIGHT/LEFT HEART CATH AND CORONARY ANGIOGRAPHY;  Surgeon: Burnell Blanks, MD;  Location: [facility];  Service: Cardiovascular;  Laterality: N/A;   VIDEO BRONCHOSCOPY WITH ENDOBRONCHIAL NAVIGATION N/A 09/27/2019   Procedure: VIDEO BRONCHOSCOPY WITH ENDOBRONCHIAL NAVIGATION;  Surgeon: Garner Nash, DO;  Location: [facility];  Service: Pulmonary;  Laterality: N/A;   VIDEO BRONCHOSCOPY WITH ENDOBRONCHIAL ULTRASOUND N/A 03/29/2021   Procedure: VIDEO BRONCHOSCOPY WITH ENDOBRONCHIAL ULTRASOUND;  Surgeon: Garner Nash, DO;  Location: [facility];  Service: Pulmonary;  Laterality: N/A;     reports that he has quit smoking. His smoking use included cigarettes. He has a 124.00 pack-year smoking history. He has been exposed to tobacco smoke. He has never used smokeless tobacco. He reports that he does not currently use alcohol. He reports that he does not use drugs.  Allergies  Allergen Reactions   Cosela [Trilaciclib] Other (See Comments)    thrombophlebitis    Family History  Problem Relation Age of Onset   Heart attack Father    Mesothelioma Sister 32   Lung cancer Brother     Prior to Admission medications   Medication Sig Start Date End Date Taking? Authorizing Provider  aspirin EC 81 MG tablet Take 1 tablet (81 mg total) by mouth daily. Swallow whole. 01/26/20  Yes Buford Dresser, MD  clobetasol cream (TEMOVATE) 2.19 % Apply 1 application topically daily as needed (Psoriasis).   Yes [provider]  docusate sodium (COLACE) 100 MG capsule Take 200 mg by mouth daily.   Yes [provider]  Fluticasone-Umeclidin-Vilant (TRELEGY ELLIPTA) 100-62.5-25 MCG/ACT AEPB Inhale 1 puff into the lungs daily.   Yes [provider]  lidocaine-prilocaine (EMLA) cream Apply to the Port-A-Cath site 30-60-minute  before chemotherapy. 09/10/21  Yes Walisiewicz, Kaitlyn E, PA-C  Multiple Vitamins-Minerals (PRESERVISION AREDS 2 PO) Take 1 tablet by mouth in the morning and at bedtime.   Yes [provider]  niacinamide 500 MG tablet Take 500 mg by mouth daily with breakfast.   Yes [provider]  polyethylene glycol (MIRALAX) 17 g packet Take 17 g by mouth daily as needed for moderate constipation.   Yes [provider]  prochlorperazine (COMPAZINE) 10 MG tablet Take 1 tablet (10 mg total) by mouth every 6 (six) hours as needed. Patient taking differently: Take 10 mg by mouth every 6 (six) hours as needed for vomiting or nausea. 04/10/21  Yes Heilingoetter, Cassandra L, PA-C  rosuvastatin (CRESTOR) 20 MG tablet Take 1 tablet (20 mg total) by mouth daily. 06/10/21 06/05/22 Yes Buford Dresser, MD  tamsulosin (FLOMAX) 0.4 MG CAPS capsule Take 0.4 mg by mouth every evening.   Yes [provider]  triamcinolone cream (KENALOG) 0.1 % Apply 1 application topically daily as needed (skin irritation.). 02/01/21  Yes [provider]  vitamin B-12 (CYANOCOBALAMIN) 500 MCG tablet Take 500 mcg by mouth every evening.   Yes [provider]  albuterol (VENTOLIN HFA) 108 (90 Base) MCG/ACT  inhaler Inhale 2 puffs into the lungs every 6 (six) hours as needed for wheezing or shortness of breath. Patient not taking: Reported on 09/27/2021 07/31/21   Rai, Vernelle Emerald, MD  AMBULATORY NON FORMULARY MEDICATION Please provide 4 wheel rolling walker with seat for [name].  Diagnosis: R26.81 Gait instability 05/10/21   [name], DO  hydrocortisone 1 % ointment Apply 1 application. topically 2 (two) times daily. To the rash. Please follow-up with your dermatologist. Patient not taking: Reported on 09/27/2021 07/31/21   Mendel Corning, MD    Physical Exam: Vitals:   09/27/21 1458 09/27/21 1625  BP: 96/71 115/62  Pulse: (!) 111 100  Resp: 18 20  Temp: (!) 100.6 F (38.1 C)    TempSrc: Oral   SpO2: 96% 96%  Weight: 90.7 kg   Height: 6' (1.829 m)     Constitutional: NAD, calm, comfortable, on nasal cannula, communicating well, appears weak, sick and dehydrated Eyes: PERRL, lids and conjunctivae normal ENMT: Mucous membranes are dry. Posterior pharynx clear of any exudate or lesions.Normal dentition.  Neck: normal, supple, no masses, no thyromegaly Respiratory: Coarse breath sounds noted with scant wheezing on the basis Cardiovascular: Regular rate and rhythm, no murmurs / rubs / gallops. No extremity edema. 2+ pedal pulses. No carotid bruits.  Abdomen: no tenderness, no masses palpated. No hepatosplenomegaly. Bowel sounds positive.  Musculoskeletal: no clubbing / cyanosis. No joint deformity upper and lower extremities. Good ROM, no contractures. Normal muscle tone.  Skin: Psoriatic rash noted on arms and hands and abdomen Neurologic: CN 2-12 grossly intact. Sensation intact, DTR normal. Strength 5/5 in all 4.  Psychiatric: Normal judgment and insight. Alert and oriented x 3. Normal mood.    Labs on Admission: I have personally reviewed following labs and imaging studies  CBC: Recent Labs  Lab 09/24/21 1026 09/27/21 1506  WBC 5.0 11.8*  NEUTROABS 2.8 10.4*  HGB 11.0* 10.4*  HCT 32.9* 32.0*  MCV 97.1 98.8  PLT 262 299   Basic Metabolic Panel: Recent Labs  Lab 09/24/21 1026 09/27/21 1506  NA 136 134*  K 4.2 4.4  CL 101 100  CO2 30 27  GLUCOSE 98 111*  BUN 19 22  CREATININE 0.90 1.11  CALCIUM 9.2 8.7*   GFR: Estimated Creatinine Clearance: 55.3 mL/min (by C-G formula based on SCr of 1.11 mg/dL). Liver Function Tests: Recent Labs  Lab 09/24/21 1026 09/27/21 1506  AST 21 41  ALT 15 39  ALKPHOS 75 72  BILITOT 0.3 0.9  PROT 6.6 6.4*  ALBUMIN 3.7 3.2*   No results for input(s): "LIPASE", "AMYLASE" in the last 168 hours. No results for input(s): "AMMONIA" in the last 168 hours. Coagulation Profile: No results for input(s): "INR",  "PROTIME" in the last 168 hours. Cardiac Enzymes: No results for input(s): "CKTOTAL", "CKMB", "CKMBINDEX", "TROPONINI" in the last 168 hours. BNP (last 3 results) No results for input(s): "PROBNP" in the last 8760 hours. HbA1C: No results for input(s): "HGBA1C" in the last 72 hours. CBG: No results for input(s): "GLUCAP" in the last 168 hours. Lipid Profile: No results for input(s): "CHOL", "HDL", "LDLCALC", "TRIG", "CHOLHDL", "LDLDIRECT" in the last 72 hours. Thyroid Function Tests: No results for input(s): "TSH", "T4TOTAL", "FREET4", "T3FREE", "THYROIDAB" in the last 72 hours. Anemia Panel: No results for input(s): "VITAMINB12", "FOLATE", "FERRITIN", "TIBC", "IRON", "RETICCTPCT" in the last 72 hours. Urine analysis:    Component Value Date/Time   COLORURINE YELLOW 07/27/2021 0206   APPEARANCEUR CLEAR 07/27/2021 0206   LABSPEC  1.040 (H) 07/27/2021 0206   PHURINE 5.5 07/27/2021 0206   GLUCOSEU NEGATIVE 07/27/2021 0206   HGBUR NEGATIVE 07/27/2021 0206   BILIRUBINUR negative 09/16/2021 1403   KETONESUR negative 09/16/2021 1403   KETONESUR NEGATIVE 07/27/2021 0206   PROTEINUR NEGATIVE 07/27/2021 0206   UROBILINOGEN 0.2 09/16/2021 1403   NITRITE Negative 09/16/2021 1403   NITRITE NEGATIVE 07/27/2021 0206   LEUKOCYTESUR Negative 09/16/2021 1403   LEUKOCYTESUR NEGATIVE 07/27/2021 0206    Radiological Exams on Admission: DG Chest 2 View  Result Date: 09/27/2021 CLINICAL DATA:  Fever.  Lung cancer EXAM: CHEST - 2 VIEW COMPARISON:  07/30/2021, 09/19/2021 FINDINGS: Left-sided chest port stable in positioning. Stable cardiomediastinal contours with prominent right paratracheal and right perihilar nodularity compatible with known adenopathy. Chronic small right pleural effusion. Progressive airspace opacity within the right lung base. Fiduciary markers in the right lung are present. Left lung is clear. No pneumothorax. IMPRESSION: 1. Progressive airspace opacity within the right lung base  concerning for infection. 2. Chronic small right pleural effusion. Electronically Signed   By: Davina Poke D.O.   On: 09/27/2021 15:44    EKG: Independently reviewed.  Sinus tachycardia, ventricular bigeminy, no acute ST-T wave changes noted.  Assessment/Plan  Sepsis secondary to right lower lobe pneumonia: Chronic hypoxemic respiratory failure -Patient has history of small cell lung cancer on chemotherapy.  On 2 to 3 L of oxygen at home.  Currently requiring the same amount of oxygen.  Reviewed chest x-ray. -He is febrile with temperature of 100.6 and tachycardic.  WBC of 11,000, lactic acid: WNL -Continue Rocephin and azithromycin.  Get blood culture, procalcitonin, urine Legionella and urine strep antigen. 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 Mckinley Jewel -On chemo every 3 weeks.  Last chemo was on 09/24/2021  History of urinary retention: Continue Flomax  Hyperlipidemia: Continue statin  Normocytic anemia: H&H is currently stable.  Continue to monitor  Hyponatremia: Mild.  Continue to monitor  History of COPD: -Continue home inhalers.  Followed by PCCM outpatient  Due to recurrent hospitalization and history of metastatic lung cancer-I will consult palliative care to discuss goals of care with patient and his wife  DVT prophylaxis: Lovenox Code Status: Full code-confirmed with patient and his wife at bedside Family Communication: Patient's wife present at bedside.  Plan of care discussed with patient in length and he verbalized understanding and agreed with it. Disposition Plan: Likely home Consults called: None Admission status: Inpatient   Mckinley Jewel MD [facility]  If 7PM-7AM, please contact night-coverage [email or web address]  09/27/2021, 5:59 PM

## 2021-09-27 NOTE — ED Provider Triage Note (Signed)
Emergency Medicine Provider Triage Evaluation Note  Gerald [name] , a 81 y.o. male  was evaluated in triage.  Pt complains of fever, weakness, shortness of breat.  Patient had chemotherapy on Tuesday.  Patient has therapy once every 3 weeks for 1 day treatment.  This is for small cell lung cancer.  Since that time patient has had fever, weakness, and increased shortness of breath.  Patient's wife states that this is happened before but this seems much more severe than usual.  Denies chest pain, abdominal pain, urinary symptoms  Review of Systems  Positive: Weakness, shortness of breath, fever Negative: Chest pain  Physical Exam  There were no vitals taken for this visit. Gen:   Awake, no distress   Resp:  Normal effort  MSK:   Moves extremities without difficulty  Other:    Medical Decision Making  Medically screening exam initiated at 2:57 PM.  Appropriate orders placed.  Saahir Prude was informed that the remainder of the evaluation will be completed by another provider, this initial triage assessment does not replace that evaluation, and the importance of remaining in the ED until their evaluation is complete.     Omkar, [place], PA-C 09/27/21 1504

## 2021-09-27 NOTE — ED Notes (Signed)
Wife went to get pt something to eat for dinner

## 2021-09-27 NOTE — Telephone Encounter (Signed)
STAT if HR is under 50 or over 120 (normal HR is 60-100 beats per minute)  What is your heart rate? 107-110  Do you have a log of your heart rate readings (document readings)? No  Do you have any other symptoms? Has a little SOB.   Spouse would like a nurse to callback soon. Please advise

## 2021-09-27 NOTE — Telephone Encounter (Signed)
RN returned call to patient and patients wife states they are headed to the emergency department because he is having rapid heart rate and a 101 degree fever with also having lung cancer. Advised patient's wife that RN would call in a few days to see how he is doing.

## 2021-09-27 NOTE — ED Provider Notes (Signed)
[facility] DEPT Provider Note   CSN: [id number] Arrival date & time: 09/27/21  1454     History  Chief Complaint  Patient presents with   Shortness of Breath    Gerald [name] is a 81 y.o. male.  81 year old male with history of small cell lung cancer presents with fever weakness shortness of breath times several days.  Patient had chemotherapy several days ago.  Patient is on oxygen as needed at 2 L.  No vomiting or diarrhea.  Cough has been slightly productive.  Denies any urinary symptoms.  Temp at home was up to 101 degrees.  No flank pain.  Used Tylenol before he came here       Home Medications Prior to Admission medications   Medication Sig Start Date End Date Taking? Authorizing Provider  albuterol (VENTOLIN HFA) 108 (90 Base) MCG/ACT inhaler Inhale 2 puffs into the lungs every 6 (six) hours as needed for wheezing or shortness of breath. 07/31/21   Rai, Vernelle Emerald, MD  AMBULATORY NON FORMULARY MEDICATION Please provide 4 wheel rolling walker with seat for [name].  Diagnosis: R26.81 Gait instability 05/10/21   [name], DO  aspirin EC 81 MG tablet Take 1 tablet (81 mg total) by mouth daily. Swallow whole. 01/26/20   Buford Dresser, MD  clobetasol cream (TEMOVATE) 3.90 % Apply 1 application topically daily as needed (Psoriasis).    [provider]  docusate sodium (COLACE) 100 MG capsule Take 200 mg by mouth daily as needed for moderate constipation.    [provider]  hydrocortisone 1 % ointment Apply 1 application. topically 2 (two) times daily. To the rash. Please follow-up with your dermatologist. 07/31/21   Mendel Corning, MD  lidocaine-prilocaine (EMLA) cream Apply to the Port-A-Cath site 30-60-minute before chemotherapy. 09/10/21   Sherol Dade E, PA-C  methylcellulose oral powder Take 1 packet by mouth daily. Take 1 packet daily for constipation Patient not taking: Reported on 09/24/2021    [provider]  Multiple Vitamins-Minerals (PRESERVISION AREDS 2 PO) Take 1 tablet by mouth in the morning and at bedtime.    [provider]  niacinamide 500 MG tablet Take 500 mg by mouth daily with breakfast.    [provider]  polyethylene glycol (MIRALAX) 17 g packet Take 17 g by mouth daily as needed.    [provider]  prochlorperazine (COMPAZINE) 10 MG tablet Take 1 tablet (10 mg total) by mouth every 6 (six) hours as needed. Patient taking differently: Take 10 mg by mouth every 6 (six) hours as needed for vomiting or nausea. 04/10/21   Heilingoetter, Cassandra L, PA-C  rosuvastatin (CRESTOR) 20 MG tablet Take 1 tablet (20 mg total) by mouth daily. 06/10/21 06/05/22  Buford Dresser, MD  tamsulosin (FLOMAX) 0.4 MG CAPS capsule Take 0.4 mg by mouth.    [provider]  triamcinolone cream (KENALOG) 0.1 % Apply 1 application topically daily as needed (skin irritation.). 02/01/21   [provider]  vitamin B-12 (CYANOCOBALAMIN) 500 MCG tablet Take 500 mcg by mouth every evening.    [provider]      Allergies    Cosela [trilaciclib]    Review of Systems   Review of Systems  All other systems reviewed and are negative.   Physical Exam Updated Vital Signs BP 96/71 (BP Location: Left Arm)   Pulse (!) 111   Temp (!) 100.6 F (38.1 C) (Oral)   Resp 18   Ht 1.829 m (  6')   Wt 90.7 kg   SpO2 96%   BMI 27.12 kg/m  Physical Exam Vitals and nursing note reviewed.  Constitutional:      General: He is not in acute distress.    Appearance: Normal appearance. He is well-developed. He is not toxic-appearing.  HENT:     Head: Normocephalic and atraumatic.  Eyes:     General: Lids are normal.     Conjunctiva/sclera: Conjunctivae normal.     Pupils: Pupils are equal, round, and reactive to light.  Neck:     Thyroid: No thyroid mass.     Trachea: No tracheal deviation.  Cardiovascular:     Rate and Rhythm: Regular rhythm.  Tachycardia present.     Heart sounds: Normal heart sounds. No murmur heard.    No gallop.  Pulmonary:     Effort: Pulmonary effort is normal. No respiratory distress.     Breath sounds: No stridor. Examination of the right-upper field reveals decreased breath sounds. Examination of the left-upper field reveals decreased breath sounds. Decreased breath sounds present. No wheezing, rhonchi or rales.  Abdominal:     General: There is no distension.     Palpations: Abdomen is soft.     Tenderness: There is no abdominal tenderness. There is no rebound.  Musculoskeletal:        General: No tenderness. Normal range of motion.     Cervical back: Normal range of motion and neck supple.  Skin:    General: Skin is warm and dry.     Findings: No abrasion or rash.  Neurological:     Mental Status: He is alert and oriented to person, place, and time. Mental status is at baseline.     GCS: GCS eye subscore is 4. GCS verbal subscore is 5. GCS motor subscore is 6.     Cranial Nerves: No cranial nerve deficit.     Sensory: No sensory deficit.     Motor: Motor function is intact.  Psychiatric:        Attention and Perception: Attention normal.        Speech: Speech normal.        Behavior: Behavior normal.     ED Results / Procedures / Treatments   Labs (all labs ordered are listed, but only abnormal results are displayed) Labs Reviewed  CULTURE, BLOOD (ROUTINE X 2)  CULTURE, BLOOD (ROUTINE X 2)  URINE CULTURE  LACTIC ACID, PLASMA  LACTIC ACID, PLASMA  COMPREHENSIVE METABOLIC PANEL  CBC WITH DIFFERENTIAL/PLATELET  PROTIME-INR  APTT  URINALYSIS, ROUTINE W REFLEX MICROSCOPIC    EKG None  Radiology No results found.  Procedures Procedures    Medications Ordered in ED Medications - No data to display  ED Course/ Medical Decision Making/ A&P                           Medical Decision Making Risk OTC drugs. Prescription drug management.   Patient chest x-ray per my  interpretation consistent with pneumonia.  Patient started on IV antibiotics with Rocephin and Zithromax.  Temperature treated with Tylenol.  Lactate is normal.  Mild leukocytosis noted on CBC.  Patient's blood pressure stable here do not feel patient is in septic shock at this time.  However patient is critically ill and will require hospitalization.  Results discussed with patient's family who are at bedside.  Plan will be to consult hospitalist for admission        Final  Clinical Impression(s) / ED Diagnoses Final diagnoses:  None    Rx / DC Orders ED Discharge Orders     None         Lacretia Leigh, MD 09/27/21 1733

## 2021-09-27 NOTE — ED Notes (Signed)
Noted pt has several OD meds that were schedule around 1800-18:15 that have not been given d/t not being verify by pharmacy. Called pharmacy to questions meds and orders. Pharmacy tech will send pharmacy a message regarding pt's med in hopes to have verify so they may be administer.

## 2021-09-27 NOTE — ED Notes (Signed)
Pt arrived via POV with spouse. C/o increasing wkn and SOB. Fevers at home. Currently undergoing chemo for lung CA.  On 2L [place] baseline Took tylenol around 7/8 this morning.

## 2021-09-27 NOTE — ED Notes (Signed)
Pt resting and watching TV, NAD noted, even RR and unlabored, call bell within reach for assistance, side rails up x2 for safety, pt voiced no concerns or questions at this time, care on going, will continue to monitor.

## 2021-09-28 DIAGNOSIS — J9611 Chronic respiratory failure with hypoxia: Secondary | ICD-10-CM

## 2021-09-28 DIAGNOSIS — E785 Hyperlipidemia, unspecified: Secondary | ICD-10-CM | POA: Diagnosis not present

## 2021-09-28 DIAGNOSIS — I10 Essential (primary) hypertension: Secondary | ICD-10-CM | POA: Diagnosis not present

## 2021-09-28 DIAGNOSIS — C3490 Malignant neoplasm of unspecified part of unspecified bronchus or lung: Secondary | ICD-10-CM | POA: Diagnosis not present

## 2021-09-28 LAB — CBC WITH DIFFERENTIAL/PLATELET
Abs Immature Granulocytes: 0.05 10*3/uL (ref 0.00–0.07)
Basophils Absolute: 0.1 10*3/uL (ref 0.0–0.1)
Basophils Relative: 1 %
Eosinophils Absolute: 0 10*3/uL (ref 0.0–0.5)
Eosinophils Relative: 0 %
HCT: 29.6 % — ABNORMAL LOW (ref 39.0–52.0)
Hemoglobin: 9.5 g/dL — ABNORMAL LOW (ref 13.0–17.0)
Immature Granulocytes: 1 %
Lymphocytes Relative: 7 %
Lymphs Abs: 0.7 10*3/uL (ref 0.7–4.0)
MCH: 32.4 pg (ref 26.0–34.0)
MCHC: 32.1 g/dL (ref 30.0–36.0)
MCV: 101 fL — ABNORMAL HIGH (ref 80.0–100.0)
Monocytes Absolute: 0.4 10*3/uL (ref 0.1–1.0)
Monocytes Relative: 4 %
Neutro Abs: 9.3 10*3/uL — ABNORMAL HIGH (ref 1.7–7.7)
Neutrophils Relative %: 87 %
Platelets: 187 10*3/uL (ref 150–400)
RBC: 2.93 MIL/uL — ABNORMAL LOW (ref 4.22–5.81)
RDW: 16 % — ABNORMAL HIGH (ref 11.5–15.5)
WBC: 10.5 10*3/uL (ref 4.0–10.5)
nRBC: 0 % (ref 0.0–0.2)

## 2021-09-28 LAB — BASIC METABOLIC PANEL
Anion gap: 6 (ref 5–15)
BUN: 20 mg/dL (ref 8–23)
CO2: 27 mmol/L (ref 22–32)
Calcium: 8.2 mg/dL — ABNORMAL LOW (ref 8.9–10.3)
Chloride: 101 mmol/L (ref 98–111)
Creatinine, Ser: 0.88 mg/dL (ref 0.61–1.24)
GFR, Estimated: >60 mL/min (ref >60)
Glucose, Bld: 102 mg/dL — ABNORMAL HIGH (ref 70–99)
Potassium: 4.1 mmol/L (ref 3.5–5.1)
Sodium: 134 mmol/L — ABNORMAL LOW (ref 135–145)

## 2021-09-28 MED ORDER — GUAIFENESIN ER 600 MG PO TB12
1200.0000 mg | ORAL_TABLET | Freq: Two times a day (BID) | ORAL | Status: DC
  Administered 2021-09-28 – 2021-10-03 (×10): 1200 mg via ORAL
  Filled 2021-09-28 (×10): qty 2

## 2021-09-28 MED ORDER — LACTATED RINGERS IV SOLN: INTRAVENOUS | Status: AC

## 2021-09-28 MED ORDER — SODIUM CHLORIDE 0.9% FLUSH: 10.0000 mL | INTRAVENOUS | Status: DC | PRN

## 2021-09-28 MED ORDER — CHLORHEXIDINE GLUCONATE CLOTH 2 % EX PADS
6.0000 | MEDICATED_PAD | Freq: Every day | CUTANEOUS | Status: DC
Start: 2021-09-28 — End: 2021-10-03
  Administered 2021-09-28 – 2021-10-02 (×5): 6 via TOPICAL

## 2021-09-28 NOTE — ED Notes (Signed)
Pt continues to sleep, NAD noted. even RR and unlabored, call bell in reach, side rails up x2 for safety, care on going, will continue to monitor.

## 2021-09-28 NOTE — ED Notes (Signed)
Pt assisted to bedside commode

## 2021-09-28 NOTE — ED Notes (Signed)
Pt resting and watching TV, NAD noted, even RR and unlabored, call bell within reach for assistance, side rails up x2 for safety, pt voiced no concerns or questions at this time, care on going, will continue to monitor.

## 2021-09-28 NOTE — Progress Notes (Signed)
PROGRESS NOTE    Gerald [name]  AOZ:[id number] DOB: 1941/01/24 DOA: 09/27/2021 PCP: [name], DO   Brief Narrative:  HPI per Dr. Early Osmond on 09/27/21  Gerald [name] is a 81 y.o. male with medical history significant of COPD, small cell lung cancer, hypertension, hyperlipidemia, urinary retention, coronary artery disease, obesity present here with complaining of poor appetite and generalized weakness that this started last night.   As per patient's wife at the bedside: Patient always gets pneumonia whenever he gets the chemotherapy.  Last chemo was done on Tuesday.  He started having poor appetite and weak, no energy, chills and fever all 101 today therefore wife brought him to the ER for further evaluation and management.   He has chronic cough and sputum production.  He is on 2 L of oxygen via nasal cannulae at home at baseline.  He denies headache, blurry vision, chest pain, wheezing, nausea, vomiting, diarrhea, abdominal pain, recent sick contact.   ED Course: Upon arrival to ED: Patient had temperature of 100.6, tachycardic at 111, respiratory rate 18, oxygen saturation 96% on 2 L, COVID and flu pending.  CBC shows leukocytosis of 11.8, H&H 10.4/32.0, platelet: 242.  Sodium 134, creatinine 1.1, GFR: More than 60.  Chest x-ray shows progressive airspace opacity within the right lung base concerning for infection.  Patient was given IV fluids and Rocephin and azithromycin in ED.  [facility] hospitalist consulted for admission for pneumonia.   **Interim History Currently being treated for his pneumonia and feels a little bit better.  Patient has some rhonchi noted and some coarse breath sounds.  We will adjust his nebs given his respiratory sounds heard on his auscultation    Assessment and Plan: No notes have been filed under this hospital service. Service: Hospitalist  Sepsis secondary to right lower lobe pneumonia: Chronic hypoxemic respiratory failure -Patient has history of small  cell lung cancer on chemotherapy.   -On 2 to 3 L of oxygen at home.   -Currently requiring the same amount of oxygen.  Reviewed chest x-ray and I am in agreement that the radiologist read as it showed "Progressive airspace opacity within the right lung base concerning for infection. Chronic small right pleural effusion" -Continue with his Breo Ellipta and Incruse Ellipta combination -We will add as needed nebs if necessary -SpO2: 100 % O2 Flow Rate (L/min): 2 L/min -He was febrile with temperature of 100.6 and tachycardic.  WBC of 11,000, lactic acid: WNL on admission -Continue Rocephin and azithromycin.   -Get blood culture, procalcitonin, urine Legionella and urine strep antigen. -Continue IV fluids as he is receiving 125 MLS per hour but will reduce the rate to 75 MLS per hour for 12 more hours -We will add flutter valve, incentive spirometry and guaifenesin -Procalcitonin level 0.36 -WBC is improving and went from 11.8 is trending down to 10.5 -Blood cultures x2 pending and urine culture still pending -Repeat chest x-ray in the a.m. 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 Mckinley Jewel -On chemo every 3 weeks.  Last chemo was on 09/24/2021 -We will notify Dr. Earlie Server still remains hospitalized through the weekend   History of urinary retention -Continue tamsulosin 0.4 mg p.o. nightly   Hyperlipidemia -Continue rosuvastatin 20 mg p.o. nightly   Normocytic anemia:  -  Patient's hemoglobin/hematocrit has been relatively stable did drop a little bit likely from a dilutional drop -Hemoglobin/hematocrit went from 10.4/32.0 on admission and is now 9.5/29.6 -Check anemia panel in a.m. -Continue to monitor for signs and symptoms of bleeding; no overt bleeding  noted -Repeat CBC in a.m.   Hyponatremia -Mild at 134 again -Continue monitor and trend and repeat CMP in a.m.   History of COPD: -Continue home inhalers.  Followed by PCCM outpatient -Continue monitor respiratory status carefully -See above  DVT prophylaxis: enoxaparin (LOVENOX) injection 40 mg Start: 09/27/21 2200 SCDs Start: 09/27/21 1757    Code Status: Full Code Family Communication: No family currently at bedside  Disposition Plan:  Level of care: Progressive Status is: Inpatient Remains inpatient appropriate because: We will need to repeat chest x-ray and improvement in his respiratory status prior to discharging and will get PT and OT to further evaluate and treat   Consultants:  None  Procedures:  None  Antimicrobials:  Anti-infectives (From admission, onward)    Start     Dose/Rate Route Frequency Ordered Stop   09/28/21 1000  cefTRIAXone (ROCEPHIN) 2 g in sodium chloride 0.9 % 100 mL IVPB        2 g 200 mL/hr over 30 Minutes Intravenous Every 24 hours 09/27/21 1758 10/03/21 0959   09/28/21 1000  azithromycin (ZITHROMAX) tablet 500 mg        500 mg Oral Daily 09/27/21 1758 10/03/21 0959   09/27/21 1615  azithromycin (ZITHROMAX) 500 mg in sodium chloride 0.9 % 250 mL IVPB  Status:  Discontinued        500 mg 250 mL/hr over 60 Minutes Intravenous Every 24 hours 09/27/21 1608 09/28/21 0829   09/27/21 1615  cefTRIAXone (ROCEPHIN) 1 g in sodium chloride 0.9 % 100 mL IVPB  Status:  Discontinued        1 g 200 mL/hr over 30 Minutes Intravenous Every 24 hours 09/27/21 1608 09/28/21 0830       Subjective: Seen and examined at bedside and he is still feeling a little short of breath.  Continues to complain of cough and weakness.  No nausea or vomiting.  Denies any chest pain.  No other concerns or complaints at this time.  Objective: Vitals:   09/28/21 1330 09/28/21 1345 09/28/21 1551 09/28/21 1551  BP:   (!) 114/57 (!) 114/57  Pulse: 83 89 96 96  Resp: 18 (!)  29 (!) 22 (!) 22  Temp:   98.9 F (37.2 C) 98.9 F (37.2 C)  TempSrc:   Oral Oral  SpO2: 97% 97% 100% 100%  Weight:      Height:        Intake/Output Summary (Last 24 hours) at 09/28/2021 1807 Last data filed at 09/28/2021 1730 Gross per 24 hour  Intake 2804.43 ml  Output --  Net 2804.43 ml   Filed Weights   09/27/21 1458  Weight: 90.7 kg   Examination: Physical Exam:  Constitutional: WN/WD overweight chronically ill-appearing Caucasian male in no acute distress Respiratory: Diminished to auscultation bilaterally with coarse breath sounds and some rhonchi noted and some slight crackles in the right lower side, no wheezing, rales Normal respiratory effort and patient is not tachypenic. No accessory muscle use.  Wearing supplemental oxygen via nasal cannula Cardiovascular: RRR, no murmurs / rubs / gallops. S1 and S2 auscultated.  Minimal extremity edema Abdomen: Soft, non-tender, slightly distended secondary body habitus. Bowel sounds positive.  GU: Deferred. Musculoskeletal: No clubbing / cyanosis of digits/nails. No  joint deformity upper and lower extremities.  Skin: No rashes, lesions, ulcers on the skin evaluation. No induration; Warm and dry.  Neurologic: CN 2-12 grossly intact with no focal deficits. Romberg sign and cerebellar reflexes not assessed.  Psychiatric: Normal judgment and insight.  He is awake and alert  Data Reviewed: I have personally reviewed following labs and imaging studies  CBC: Recent Labs  Lab 09/24/21 1026 09/27/21 1506 09/27/21 1953 09/28/21 0436  WBC 5.0 11.8* 10.6* 10.5  NEUTROABS 2.8 10.4*  --  9.3*  HGB 11.0* 10.4* 9.4* 9.5*  HCT 32.9* 32.0* 29.1* 29.6*  MCV 97.1 98.8 99.0 101.0*  PLT 262 242 200 784   Basic Metabolic Panel: Recent Labs  Lab 09/24/21 1026 09/27/21 1506 09/27/21 1953 09/28/21 0436  NA 136 134*  --  134*  K 4.2 4.4  --  4.1  CL 101 100  --  101  CO2 30 27  --  27  GLUCOSE 98 111*  --  102*  BUN 19 22  --  20   CREATININE 0.90 1.11 1.12 0.88  CALCIUM 9.2 8.7*  --  8.2*   GFR: Estimated Creatinine Clearance: 69.8 mL/min (by C-G formula based on SCr of 0.88 mg/dL). Liver Function Tests: Recent Labs  Lab 09/24/21 1026 09/27/21 1506  AST 21 41  ALT 15 39  ALKPHOS 75 72  BILITOT 0.3 0.9  PROT 6.6 6.4*  ALBUMIN 3.7 3.2*   No results for input(s): "LIPASE", "AMYLASE" in the last 168 hours. No results for input(s): "AMMONIA" in the last 168 hours. Coagulation Profile: Recent Labs  Lab 09/27/21 1506  INR 1.3*   Cardiac Enzymes: No results for input(s): "CKTOTAL", "CKMB", "CKMBINDEX", "TROPONINI" in the last 168 hours. BNP (last 3 results) No results for input(s): "PROBNP" in the last 8760 hours. HbA1C: No results for input(s): "HGBA1C" in the last 72 hours. CBG: No results for input(s): "GLUCAP" in the last 168 hours. Lipid Profile: No results for input(s): "CHOL", "HDL", "LDLCALC", "TRIG", "CHOLHDL", "LDLDIRECT" in the last 72 hours. Thyroid Function Tests: No results for input(s): "TSH", "T4TOTAL", "FREET4", "T3FREE", "THYROIDAB" in the last 72 hours. Anemia Panel: No results for input(s): "VITAMINB12", "FOLATE", "FERRITIN", "TIBC", "IRON", "RETICCTPCT" in the last 72 hours. Sepsis Labs: Recent Labs  Lab 09/27/21 1506 09/27/21 1953  PROCALCITON  --  0.36  LATICACIDVEN 1.3  --     Recent Results (from the past 240 hour(s))  Blood Culture (routine x 2)     Status: None (Preliminary result)   Collection Time: 09/27/21  4:40 PM   Specimen: BLOOD  Result Value Ref Range Status   Specimen Description   Final    BLOOD LEFT ANTECUBITAL Performed at [facility], [place] [address] [place], [place] [address]    Special Requests   Final    BOTTLES DRAWN AEROBIC AND ANAEROBIC Blood Culture results may not be optimal due to an inadequate volume of blood received in culture bottles Performed at [facility] [address] [place], [place]  [address]    Culture   Final    NO GROWTH < 12 HOURS Performed at [facility] [address] [place], [place] [address]    Report Status PENDING  Incomplete  Blood Culture (routine x 2)     Status: None (Preliminary result)   Collection Time: 09/27/21  4:45 PM   Specimen: BLOOD  Result Value Ref Range Status   Specimen Description   Final  BLOOD BLOOD LEFT FOREARM Performed at [facility] [address], [place], [place] [address]    Special Requests   Final    BOTTLES DRAWN AEROBIC ONLY Blood Culture adequate volume Performed at [facility] [address] [place], [place] [address]    Culture   Final    NO GROWTH < 12 HOURS Performed at [facility] [address], [place], [place] [address]    Report Status PENDING  Incomplete     Radiology Studies: DG Chest 2 View  Result Date: 09/27/2021 CLINICAL DATA:  Fever.  Lung cancer EXAM: CHEST - 2 VIEW COMPARISON:  07/30/2021, 09/19/2021 FINDINGS: Left-sided chest port stable in positioning. Stable cardiomediastinal contours with prominent right paratracheal and right perihilar nodularity compatible with known adenopathy. Chronic small right pleural effusion. Progressive airspace opacity within the right lung base. Fiduciary markers in the right lung are present. Left lung is clear. No pneumothorax. IMPRESSION: 1. Progressive airspace opacity within the right lung base concerning for infection. 2. Chronic small right pleural effusion. Electronically Signed   By: Davina Poke D.O.   On: 09/27/2021 15:44    Scheduled Meds:  aspirin EC  81 mg Oral Daily   azithromycin  500 mg Oral Daily   Chlorhexidine Gluconate Cloth  6 each Topical Daily   docusate sodium  200 mg Oral Daily   enoxaparin (LOVENOX) injection  40 mg Subcutaneous Q24H   fluticasone furoate-vilanterol  1 puff Inhalation Daily   And   umeclidinium bromide  1 puff Inhalation Daily   rosuvastatin  20 mg Oral Daily    tamsulosin  0.4 mg Oral QPM   vitamin B-12  500 mcg Oral QPM   Continuous Infusions:  sodium chloride 75 mL/hr at 09/28/21 1641   cefTRIAXone (ROCEPHIN)  IV 2 g (09/28/21 0922)   lactated ringers 125 mL/hr at 09/28/21 1642    LOS: 1 day   Raiford Noble, DO [facility] Available via Epic secure chat 7am-7pm After these hours, please refer to coverage provider listed on [email or web address] 09/28/2021, 6:07 PM

## 2021-09-28 NOTE — Plan of Care (Signed)

## 2021-09-28 NOTE — ED Notes (Signed)
Pt appears to be sleeping, even RR and unlabored, NAD noted, call bell in reach, side rails up x2 for safety, care on going, will continue to monitor.

## 2021-09-28 NOTE — Hospital Course (Signed)
HPI per Dr. Early Osmond on 09/27/21  Gerald [name] is a 81 y.o. male with medical history significant of COPD, small cell lung cancer, hypertension, hyperlipidemia, urinary retention, coronary artery disease, obesity present here with complaining of poor appetite and generalized weakness that this started last night.   As per patient's wife at the bedside: Patient always gets pneumonia whenever he gets the chemotherapy.  Last chemo was done on Tuesday.  He started having poor appetite and weak, no energy, chills and fever all 101 today therefore wife brought him to the ER for further evaluation and management.   He has chronic cough and sputum production.  He is on 2 L of oxygen via nasal cannulae at home at baseline.  He denies headache, blurry vision, chest pain, wheezing, nausea, vomiting, diarrhea, abdominal pain, recent sick contact.   ED Course: Upon arrival to ED: Patient had temperature of 100.6, tachycardic at 111, respiratory rate 18, oxygen saturation 96% on 2 L, COVID and flu pending.  CBC shows leukocytosis of 11.8, H&H 10.4/32.0, platelet: 242.  Sodium 134, creatinine 1.1, GFR: More than 60.  Chest x-ray shows progressive airspace opacity within the right lung base concerning for infection.  Patient was given IV fluids and Rocephin and azithromycin in ED.  [facility] hospitalist consulted for admission for pneumonia.   **Interim History Currently being treated for his pneumonia and feels a little bit better.  Patient has some rhonchi noted and some coarse breath sounds.  We will adjust his nebs given his respiratory sounds heard on his auscultation

## 2021-09-29 ENCOUNTER — Inpatient Hospital Stay (HOSPITAL_COMMUNITY): Payer: Medicare Other

## 2021-09-29 DIAGNOSIS — J9611 Chronic respiratory failure with hypoxia: Secondary | ICD-10-CM | POA: Diagnosis not present

## 2021-09-29 DIAGNOSIS — C3490 Malignant neoplasm of unspecified part of unspecified bronchus or lung: Secondary | ICD-10-CM | POA: Diagnosis not present

## 2021-09-29 DIAGNOSIS — L8990 Pressure ulcer of unspecified site, unspecified stage: Secondary | ICD-10-CM | POA: Insufficient documentation

## 2021-09-29 DIAGNOSIS — E785 Hyperlipidemia, unspecified: Secondary | ICD-10-CM | POA: Diagnosis not present

## 2021-09-29 DIAGNOSIS — I10 Essential (primary) hypertension: Secondary | ICD-10-CM | POA: Diagnosis not present

## 2021-09-29 LAB — CBC WITH DIFFERENTIAL/PLATELET
Abs Immature Granulocytes: 0.06 10*3/uL (ref 0.00–0.07)
Basophils Absolute: 0 10*3/uL (ref 0.0–0.1)
Basophils Relative: 0 %
Eosinophils Absolute: 0 10*3/uL (ref 0.0–0.5)
Eosinophils Relative: 0 %
HCT: 27.8 % — ABNORMAL LOW (ref 39.0–52.0)
Hemoglobin: 8.9 g/dL — ABNORMAL LOW (ref 13.0–17.0)
Immature Granulocytes: 1 %
Lymphocytes Relative: 5 %
Lymphs Abs: 0.6 10*3/uL — ABNORMAL LOW (ref 0.7–4.0)
MCH: 31.9 pg (ref 26.0–34.0)
MCHC: 32 g/dL (ref 30.0–36.0)
MCV: 99.6 fL (ref 80.0–100.0)
Monocytes Absolute: 0.4 10*3/uL (ref 0.1–1.0)
Monocytes Relative: 4 %
Neutro Abs: 10.5 10*3/uL — ABNORMAL HIGH (ref 1.7–7.7)
Neutrophils Relative %: 90 %
Platelets: 160 10*3/uL (ref 150–400)
RBC: 2.79 MIL/uL — ABNORMAL LOW (ref 4.22–5.81)
RDW: 15.9 % — ABNORMAL HIGH (ref 11.5–15.5)
WBC: 11.6 10*3/uL — ABNORMAL HIGH (ref 4.0–10.5)
nRBC: 0 % (ref 0.0–0.2)

## 2021-09-29 LAB — COMPREHENSIVE METABOLIC PANEL
ALT: 22 U/L (ref 0–44)
AST: 22 U/L (ref 15–41)
Albumin: 2.5 g/dL — ABNORMAL LOW (ref 3.5–5.0)
Alkaline Phosphatase: 62 U/L (ref 38–126)
Anion gap: 5 (ref 5–15)
BUN: 16 mg/dL (ref 8–23)
CO2: 27 mmol/L (ref 22–32)
Calcium: 8 mg/dL — ABNORMAL LOW (ref 8.9–10.3)
Chloride: 102 mmol/L (ref 98–111)
Creatinine, Ser: 0.9 mg/dL (ref 0.61–1.24)
GFR, Estimated: >60 mL/min (ref >60)
Glucose, Bld: 111 mg/dL — ABNORMAL HIGH (ref 70–99)
Potassium: 3.9 mmol/L (ref 3.5–5.1)
Sodium: 134 mmol/L — ABNORMAL LOW (ref 135–145)
Total Bilirubin: 0.8 mg/dL (ref 0.3–1.2)
Total Protein: 5.4 g/dL — ABNORMAL LOW (ref 6.5–8.1)

## 2021-09-29 LAB — MRSA NEXT GEN BY PCR, NASAL: MRSA by PCR Next Gen: DETECTED — AB

## 2021-09-29 LAB — MAGNESIUM: Magnesium: 2 mg/dL (ref 1.7–2.4)

## 2021-09-29 LAB — PHOSPHORUS: Phosphorus: 2.6 mg/dL (ref 2.5–4.6)

## 2021-09-29 LAB — SARS CORONAVIRUS 2 BY RT PCR: SARS Coronavirus 2 by RT PCR: NEGATIVE

## 2021-09-29 MED ORDER — IPRATROPIUM BROMIDE 0.02 % IN SOLN
0.5000 mg | Freq: Four times a day (QID) | RESPIRATORY_TRACT | Status: DC
  Administered 2021-09-29 (×2): 0.5 mg via RESPIRATORY_TRACT
  Filled 2021-09-29 (×2): qty 2.5

## 2021-09-29 MED ORDER — LEVALBUTEROL HCL 0.63 MG/3ML IN NEBU
0.6300 mg | INHALATION_SOLUTION | Freq: Four times a day (QID) | RESPIRATORY_TRACT | Status: DC | PRN
Start: 2021-09-29 — End: 2021-10-03

## 2021-09-29 MED ORDER — METHYLPREDNISOLONE SODIUM SUCC 40 MG IJ SOLR
40.0000 mg | Freq: Two times a day (BID) | INTRAMUSCULAR | Status: DC
  Administered 2021-09-29 – 2021-10-03 (×9): 40 mg via INTRAVENOUS
  Filled 2021-09-29 (×9): qty 1

## 2021-09-29 MED ORDER — MUPIROCIN 2 % EX OINT
1.0000 | TOPICAL_OINTMENT | Freq: Two times a day (BID) | CUTANEOUS | Status: DC
  Administered 2021-09-29 – 2021-10-03 (×8): 1 via NASAL
  Filled 2021-09-29: qty 22

## 2021-09-29 MED ORDER — LEVALBUTEROL HCL 0.63 MG/3ML IN NEBU
0.6300 mg | INHALATION_SOLUTION | Freq: Three times a day (TID) | RESPIRATORY_TRACT | Status: DC
  Administered 2021-09-30 – 2021-10-03 (×10): 0.63 mg via RESPIRATORY_TRACT
  Filled 2021-09-29 (×10): qty 3

## 2021-09-29 MED ORDER — LEVALBUTEROL HCL 0.63 MG/3ML IN NEBU
0.6300 mg | INHALATION_SOLUTION | Freq: Four times a day (QID) | RESPIRATORY_TRACT | Status: DC
Start: 2021-09-29 — End: 2021-09-29
  Administered 2021-09-29 (×2): 0.63 mg via RESPIRATORY_TRACT
  Filled 2021-09-29 (×2): qty 3

## 2021-09-29 MED ORDER — IPRATROPIUM BROMIDE 0.02 % IN SOLN
0.5000 mg | Freq: Three times a day (TID) | RESPIRATORY_TRACT | Status: DC
  Administered 2021-09-30 – 2021-10-03 (×10): 0.5 mg via RESPIRATORY_TRACT
  Filled 2021-09-29 (×10): qty 2.5

## 2021-09-29 NOTE — Plan of Care (Signed)

## 2021-09-29 NOTE — Evaluation (Signed)
Occupational Therapy Evaluation Patient Details Name: Gerald [name] MRN: [id number] DOB: 04-21-1940 Today's Date: 09/29/2021   History of Present Illness 81 yo male admitted with Pna, weakness, sepsis. Hx of COPD, lung ca, CAD   Clinical Impression   Mr. Gerald [name] is an 81 year old man admitted with above medical history. On evaluation he presents with generalized weakness and decreased activity tolerance resulting in a decline in functional abilities. Patient overall min guard for functional mobility but needing increased assistance for ADLs. O2 sats hard to monitor due to poor pleth signal but no significant dyspnea during evaluation.  Should progress nicely in hospital and do not expect any OT needs at discharge. Will follow acutely to advance ADL independence.     Recommendations for follow up therapy are one component of a multi-disciplinary discharge planning process, led by the attending physician.  Recommendations may be updated based on patient status, additional functional criteria and insurance authorization.   Follow Up Recommendations  No OT follow up    Assistance Recommended at Discharge Frequent or constant Supervision/Assistance  Patient can return home with the following A little help with bathing/dressing/bathroom;A little help with walking and/or transfers;Assistance with cooking/housework    Functional Status Assessment  Patient has had a recent decline in their functional status and demonstrates the ability to make significant improvements in function in a reasonable and predictable amount of time.  Equipment Recommendations  None recommended by OT    Recommendations for Other Services       Precautions / Restrictions Precautions Precautions: Fall Precaution Comments: monitor O2 Restrictions Weight Bearing Restrictions: No      Mobility Bed Mobility                    Transfers                          Balance                                            ADL either performed or assessed with clinical judgement   ADL Overall ADL's : Needs assistance/impaired Eating/Feeding: Independent   Grooming: Min guard;Standing   Upper Body Bathing: Set up;Sitting   Lower Body Bathing: Minimal assistance;Sit to/from stand   Upper Body Dressing : Set up;Sitting   Lower Body Dressing: Moderate assistance;Sit to/from stand   Toilet Transfer: Nature conservation officer;Ambulation;Rolling walker (2 wheels);Grab bars   Toileting- Clothing Manipulation and Hygiene: Maximal assistance;Sit to/from stand       Functional mobility during ADLs: Min guard;Rolling walker (2 wheels) General ADL Comments: MOd I for bed mobility. Able to stand and use urinal but somewhat unsteady. Needed to sit down quickly exhibiting poor activity tolerance. Reported needed assistance with bowel movement from nursing staff.     Vision Patient Visual Report: No change from baseline Vision Assessment?: No apparent visual deficits     Perception     Praxis      Pertinent Vitals/Pain Pain Assessment Pain Assessment: No/denies pain     Hand Dominance Right   Extremity/Trunk Assessment Upper Extremity Assessment Upper Extremity Assessment: Overall WFL for tasks assessed   Lower Extremity Assessment Lower Extremity Assessment: Defer to PT evaluation   Cervical / Trunk Assessment Cervical / Trunk Assessment: Normal   Communication Communication Communication: No difficulties   Cognition Arousal/Alertness: Awake/alert Behavior During  Therapy: WFL for tasks assessed/performed Overall Cognitive Status: Within Functional Limits for tasks assessed                                       General Comments       Exercises     Shoulder Instructions      Home Living Family/patient expects to be discharged to:: Private residence Living Arrangements: Spouse/significant other Available Help at Discharge:  Family;Available 24 hours/day Type of Home: House Home Access: Level entry     Home Layout: One level     Bathroom Shower/Tub: Occupational psychologist: Standard     Home Equipment: Air cabin crew (4 wheels);Cane - single point;Hand held shower head   Additional Comments: rollator belongs to his wife but she does not use it anymore so it is available; not usually on O2      Prior Functioning/Environment Prior Level of Function : Independent/Modified Independent             Mobility Comments: independent; still drive; wife handles money and meds ADLs Comments: usually independent - dresses himself, typically stands in shower        OT Problem List: Decreased activity tolerance;Cardiopulmonary status limiting activity;Impaired balance (sitting and/or standing)      OT Treatment/Interventions: Self-care/ADL training;DME and/or AE instruction;Therapeutic activities;Patient/family education;Balance training    OT Goals(Current goals can be found in the care plan section) Acute Rehab OT Goals Patient Stated Goal: perform toileting independently OT Goal Formulation: With patient Time For Goal Achievement: 10/13/21 Potential to Achieve Goals: Good  OT Frequency: Min 2X/week    Co-evaluation              AM-PAC OT "6 Clicks" Daily Activity     Outcome Measure Help from another person eating meals?: None Help from another person taking care of personal grooming?: A Little Help from another person toileting, which includes using toliet, bedpan, or urinal?: A Lot Help from another person bathing (including washing, rinsing, drying)?: A Little Help from another person to put on and taking off regular upper body clothing?: A Little Help from another person to put on and taking off regular lower body clothing?: A Lot 6 Click Score: 17   End of Session Equipment Utilized During Treatment: Rolling walker (2 wheels);Oxygen Nurse Communication: Mobility  status  Activity Tolerance: Patient tolerated treatment well Patient left: in bed;with call bell/phone within reach;with family/visitor present;with bed alarm set  OT Visit Diagnosis: Muscle weakness (generalized) (M62.81)                Time: 1610-9604 OT Time Calculation (min): 17 min Charges:  OT General Charges $OT Visit: 1 Visit OT Evaluation $OT Eval Low Complexity: 1 Low  Bailie Christenbury, OTR/L [place]  Office [phone] Pager: [place] 09/29/2021, 3:04 PM

## 2021-09-29 NOTE — Evaluation (Signed)
Physical Therapy Evaluation Patient Details Name: Gerald [name] MRN: [id number] DOB: [birth year] Today's Date: 09/29/2021  History of Present Illness  81 yo male admitted with Pna, weakness, sepsis. Hx of COPD, lung ca, CAD  Clinical Impression  On eval, pt was Min guard assist for mobility. He walked ~65 feet with a RW. O2 86% on 4L O2 with ambulation, 92% on 2L at rest. Will continue to follow and progress activity as tolerated.        Recommendations for follow up therapy are one component of a multi-disciplinary discharge planning process, led by the attending physician.  Recommendations may be updated based on patient status, additional functional criteria and insurance authorization.  Follow Up Recommendations Home health PT      Assistance Recommended at Discharge PRN  Patient can return home with the following  A little help with walking and/or transfers;A little help with bathing/dressing/bathroom;Assist for transportation;Assistance with cooking/housework    Equipment Recommendations None recommended by PT  Recommendations for Other Services       Functional Status Assessment Patient has had a recent decline in their functional status and demonstrates the ability to make significant improvements in function in a reasonable and predictable amount of time.     Precautions / Restrictions Precautions Precautions: Fall Precaution Comments: monitor O2 Restrictions Weight Bearing Restrictions: No      Mobility  Bed Mobility Overal bed mobility: Needs Assistance Bed Mobility: Supine to Sit     Supine to sit: Min assist     General bed mobility comments: Assist for trunk to upright. Increased time.    Transfers Overall transfer level: Needs assistance Equipment used: Rolling walker (2 wheels) Transfers: Sit to/from Stand Sit to Stand: Min guard           General transfer comment: Min guard for safety. Cues for safety    Ambulation/Gait Ambulation/Gait  assistance: Min guard Gait Distance (Feet): 65 Feet Assistive device: Rolling walker (2 wheels) Gait Pattern/deviations: Step-through pattern, Decreased stride length       General Gait Details: Min guard for safety. O2 86% on 4L. Dyspnea 2/4.  Stairs            Wheelchair Mobility    Modified Rankin (Stroke Patients Only)       Balance Overall balance assessment: Needs assistance         Standing balance support: Bilateral upper extremity supported, During functional activity, Reliant on assistive device for balance Standing balance-Leahy Scale: Fair                               Pertinent Vitals/Pain Pain Assessment Pain Assessment: Faces Faces Pain Scale: Hurts even more Pain Location: back Pain Descriptors / Indicators: Discomfort, Sore, Aching Pain Intervention(s): Limited activity within patient's tolerance, Monitored during session    Home Living Family/patient expects to be discharged to:: Private residence Living Arrangements: Spouse/significant other Available Help at Discharge: Family;Available 24 hours/day Type of Home: House Home Access: Level entry       Home Layout: One level Home Equipment: Air cabin crew (4 wheels);Cane - single point;Hand held shower head Additional Comments: rollator belongs to his wife but she does not use it anymore so it is available; not usually on O2    Prior Function Prior Level of Function : Independent/Modified Independent             Mobility Comments: independent; still drive; wife handles money and meds ADLs Comments: usually  independent     Hand Dominance   Dominant Hand: Right    Extremity/Trunk Assessment   Upper Extremity Assessment Upper Extremity Assessment: Defer to OT evaluation    Lower Extremity Assessment Lower Extremity Assessment: Generalized weakness    Cervical / Trunk Assessment Cervical / Trunk Assessment: Normal  Communication   Communication: No  difficulties  Cognition Arousal/Alertness: Awake/alert Behavior During Therapy: WFL for tasks assessed/performed Overall Cognitive Status: Within Functional Limits for tasks assessed                                          General Comments      Exercises     Assessment/Plan    PT Assessment Patient needs continued PT services  PT Problem List Decreased strength;Decreased balance;Decreased range of motion;Decreased activity tolerance;Decreased mobility;Decreased knowledge of use of DME       PT Treatment Interventions DME instruction;Gait training;Functional mobility training;Therapeutic activities;Balance training;Therapeutic exercise;Patient/family education    PT Goals (Current goals can be found in the Care Plan section)  Acute Rehab PT Goals Patient Stated Goal: breathing better PT Goal Formulation: With patient/family Time For Goal Achievement: 10/13/21 Potential to Achieve Goals: Good    Frequency Min 3X/week     Co-evaluation               AM-PAC PT "6 Clicks" Mobility  Outcome Measure Help needed turning from your back to your side while in a flat bed without using bedrails?: A Little Help needed moving from lying on your back to sitting on the side of a flat bed without using bedrails?: A Little Help needed moving to and from a bed to a chair (including a wheelchair)?: A Little Help needed standing up from a chair using your arms (e.g., wheelchair or bedside chair)?: A Little Help needed to walk in hospital room?: A Little Help needed climbing 3-5 steps with a railing? : A Little 6 Click Score: 18    End of Session Equipment Utilized During Treatment: Oxygen Activity Tolerance: Patient tolerated treatment well Patient left: in chair;with call bell/phone within reach;with family/visitor present   PT Visit Diagnosis: Difficulty in walking, not elsewhere classified (R26.2);Muscle weakness (generalized) (M62.81);Unsteadiness on feet  (R26.81)    Time: 7564-3329 PT Time Calculation (min) (ACUTE ONLY): 24 min   Charges:   PT Evaluation $PT Eval Moderate Complexity: 1 Mod PT Treatments $Gait Training: 8-22 mins          Doreatha Massed, PT Acute Rehabilitation  Office: [phone] Pager: [phone]

## 2021-09-29 NOTE — Progress Notes (Signed)
SATURATION QUALIFICATIONS: (This note is used to comply with regulatory documentation for home oxygen)  Patient Saturations on 2L of oxygen at Rest = 92%  Patient Saturations on 4L of oxygen while Ambulating = 86%   Please briefly explain why patient needs home oxygen: Patient needed as much as 6L of oxygen to tolerate ambulation/movement and for sats to get back up to 90-91%

## 2021-09-29 NOTE — Progress Notes (Signed)
PROGRESS NOTE    Gerald [name]  NID:[id number] DOB: [birth year] DOA: 09/27/2021 PCP: [name], DO   Brief Narrative:  HPI per Dr. Early Osmond on 09/27/21  Gerald [name] is a 81 y.o. male with medical history significant of COPD, small cell lung cancer, hypertension, hyperlipidemia, urinary retention, coronary artery disease, obesity present here with complaining of poor appetite and generalized weakness that this started last night.   As per patient's wife at the bedside: Patient always gets pneumonia whenever he gets the chemotherapy.  Last chemo was done on Tuesday.  He started having poor appetite and weak, no energy, chills and fever all 101 today therefore wife brought him to the ER for further evaluation and management.   He has chronic cough and sputum production.  He is on 2 L of oxygen via nasal cannulae at home at baseline.  He denies headache, blurry vision, chest pain, wheezing, nausea, vomiting, diarrhea, abdominal pain, recent sick contact.   ED Course: Upon arrival to ED: Patient had temperature of 100.6, tachycardic at 111, respiratory rate 18, oxygen saturation 96% on 2 L, COVID and flu pending.  CBC shows leukocytosis of 11.8, H&H 10.4/32.0, platelet: 242.  Sodium 134, creatinine 1.1, GFR: More than 60.  Chest x-ray shows progressive airspace opacity within the right lung base concerning for infection.  Patient was given IV fluids and Rocephin and azithromycin in ED.  [facility] consulted for admission for pneumonia.   **Interim History Currently being treated for his pneumonia and feels a little bit better however he desaturated quite a bit on ambulatory home O2 screen.  Patient has some rhonchi noted and some coarse breath sounds so we will stop his Incruse Ellipta and start scheduled Xopenex and Atrovent as well as Solu-Medrol and add guaifenesin and flutter valve and incentive spirometry.  We will adjust his nebs given his respiratory sounds heard on his  auscultation but if he is not improving we will need to consult pulmonary for further evaluation and recommendations   Assessment and Plan: No notes have been filed under this hospital service. Service: Hospitalist  Sepsis secondary to right lower lobe pneumonia: Acute on chronic hypoxemic respiratory failure -Patient has history of small cell lung cancer on chemotherapy.   -On 2 to 3 L of oxygen at home.   -Currently requiring the same amount of oxygen however he desaturated quite a bit on ambulation.  Reviewed chest x-ray and I am in agreement that the radiologist read as it showed "Progressive airspace opacity within the right lung base concerning for infection. Chronic small right pleural effusion" -Continue with his Breo Ellipta but will discontinue Incruse Ellipta and start nebulized treatments as below -We will add as needed nebs if necessary -SpO2: 96 % O2 Flow Rate (L/min): 2 L/min FiO2 (%): 28 % -He was febrile with temperature of 100.6 and tachycardic.  WBC of 11,000, lactic acid: WNL on admission -Continue Rocephin and azithromycin.   -WBC trended down and now trended back up slowly is now 11.6 -Get blood culture, procalcitonin, urine Legionella and urine strep antigen. -Continue IV fluids as he is receiving 125 MLS per hour but will reduce the rate to 75 MLS per hour for 12 more hours -We will add flutter valve, incentive spirometry and guaifenesin -Procalcitonin level 0.36 -WBC is improving and went from 11.8 is trending down to 10.5 -Blood cultures x2 pending and urine culture still pending -Repeat chest x-ray in the a.m. and will need an amatory home O2 screen prior to  discharge -Repeat chest x-ray this AM showed "Progressive volume loss and opacification of the right hemithorax consistent with pneumonia and/or posterior obstructive pneumonitis from the central tumor demonstrated on CT.  The left lung remains clear." -Given his continued rhonchi we have changed his  breathing treatments and added Xopenex and Atrovent and discontinued his Incruse Ellipta; also added scheduled Solu-Medrol 40 mg every 12 and guaifenesin 1200 mg p.o. 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 Mckinley Jewel -On chemo every 3 weeks.  Last chemo was on 09/24/2021 -We will notify Dr. Earlie Server still remains hospitalized through the weekend   History of urinary retention -Continue Tamsulosin 0.4 mg p.o. nightly   Hyperlipidemia -Continue Rosuvastatin 20 mg p.o. nightly   Normocytic anemia:  -Patient's hemoglobin/hematocrit has been relatively stable did drop a little bit likely from a dilutional drop -Hemoglobin/hematocrit went from 10.4/32.0 on admission and is now 9.5/29.6 and slowly still trending down and today is now 8.9/27.8 -Check anemia panel in a.m. -Continue to monitor for signs and symptoms of bleeding; no overt bleeding noted -Repeat CBC in a.m.   Hyponatremia -Mild at 134 again -Continue monitor and trend and repeat CMP in a.m.   History of COPD: -Continue home inhalers but will discontinue his Incruse Ellipta and start Xopenex and Atrovent every 6.  Followed by [facility] outpatient and may need pulmonary further evaluation if he is not improving -Continue monitor respiratory status carefully -See above  DVT prophylaxis: enoxaparin (LOVENOX) injection 40 mg Start: 09/27/21 2200 SCDs Start: 09/27/21 1757    Code Status: Full Code Family Communication: No family currently at bedside  Disposition Plan:  Level of care: Progressive Status is: Inpatient Remains inpatient appropriate because: Continues to be dyspneic and needs further improvement in respiratory status prior to safe discharge disposition"  PT OT evaluation   Consultants:  None  Procedures:  None  Antimicrobials:  Anti-infectives (From admission, onward)    Start     Dose/Rate Route Frequency Ordered Stop   09/28/21 1000  cefTRIAXone (ROCEPHIN) 2 g in sodium chloride 0.9 % 100 mL IVPB        2 g 200 mL/hr over 30 Minutes Intravenous Every 24 hours 09/27/21 1758 10/03/21 0959   09/28/21 1000  azithromycin (ZITHROMAX) tablet 500 mg        500 mg Oral Daily 09/27/21 1758 10/03/21 0959   09/27/21 1615  azithromycin (ZITHROMAX) 500 mg in sodium chloride 0.9 % 250 mL IVPB  Status:  Discontinued        500 mg 250 mL/hr over 60 Minutes Intravenous Every 24 hours 09/27/21 1608 09/28/21 0829   09/27/21 1615  cefTRIAXone (ROCEPHIN) 1 g in sodium chloride 0.9 % 100 mL IVPB  Status:  Discontinued        1 g 200 mL/hr over 30 Minutes Intravenous Every 24 hours 09/27/21 1608 09/28/21 0830       Subjective: Seen and examined at bedside and thinks is doing slightly better.  No nausea or vomiting.  Continues to be a little dyspneic.  Still coughing quite a bit.  States he gets pneumonia every time he gets chemotherapy.  No other concerns or complaints this time.  Objective: Vitals:   09/28/21 2024 09/29/21 0508 09/29/21 1323 09/29/21 1337  BP: (!) 106/52 (!) 105/51 (!) 117/59   Pulse: 92 88 89   Resp: 20 (!) 22 16 20   Temp: 98.6 F (37 C) 98.2 F (36.8 C) 97.9 F (36.6 C)   TempSrc: Oral Oral Oral   SpO2: 96% 93% 96% 96%  Weight:      Height:        Intake/Output Summary (Last 24 hours) at 09/29/2021 1648 Last data filed at 09/29/2021 1500 Gross per 24 hour  Intake 3004.38 ml  Output 375 ml  Net 2629.38 ml   Filed Weights   09/27/21 1458  Weight: 90.7 kg   Examination: Physical Exam:  Constitutional: WN/WD overweight elderly Caucasian male currently no acute distress Respiratory: Diminished to auscultation bilaterally with coarse breath sound rhonchi and crackles noted.  Very slight wheezing but no appreciable  rales.. Normal respiratory effort and patient is not tachypenic. No accessory muscle use.  Unlabored breathing but is wearing supplemental oxygen via nasal cannula Cardiovascular: RRR, no murmurs / rubs / gallops. S1 and S2 auscultated.  Mild 1+ lower from edema Abdomen: Soft, non-tender, distended secondary body habitus. Bowel sounds positive.  GU: Deferred. Musculoskeletal: No clubbing / cyanosis of digits/nails. No joint deformity upper and lower extremities.  Skin: No rashes, lesions, ulcers on limited skin evaluation. No induration; Warm and dry.  Neurologic: CN 2-12 grossly intact with no focal deficits. Romberg sign and cerebellar reflexes not assessed.  Psychiatric: Normal judgment and insight. Alert and oriented x 3. Normal mood and appropriate affect.   Data Reviewed: I have personally reviewed following labs and imaging studies  CBC: Recent Labs  Lab 09/24/21 1026 09/27/21 1506 09/27/21 1953 09/28/21 0436 09/29/21 0413  WBC 5.0 11.8* 10.6* 10.5 11.6*  NEUTROABS 2.8 10.4*  --  9.3* 10.5*  HGB 11.0* 10.4* 9.4* 9.5* 8.9*  HCT 32.9* 32.0* 29.1* 29.6* 27.8*  MCV 97.1 98.8 99.0 101.0* 99.6  PLT 262 242 200 187 324   Basic Metabolic Panel: Recent Labs  Lab 09/24/21 1026 09/27/21 1506 09/27/21 1953 09/28/21 0436 09/29/21 0413  NA 136 134*  --  134* 134*  K 4.2 4.4  --  4.1 3.9  CL 101 100  --  101 102  CO2 30 27  --  27 27  GLUCOSE 98 111*  --  102* 111*  BUN 19 22  --  20 16  CREATININE 0.90 1.11 1.12 0.88 0.90  CALCIUM 9.2 8.7*  --  8.2* 8.0*  MG  --   --   --   --  2.0  PHOS  --   --   --   --  2.6   GFR: Estimated Creatinine Clearance: 68.3 mL/min (by C-G formula based on SCr of 0.9 mg/dL). Liver Function Tests: Recent Labs  Lab 09/24/21 1026 09/27/21 1506 09/29/21 0413  AST 21 41 22  ALT 15 39 22  ALKPHOS 75 72 62  BILITOT 0.3 0.9 0.8  PROT 6.6 6.4* 5.4*  ALBUMIN 3.7 3.2* 2.5*   No results for input(s): "LIPASE", "AMYLASE" in the last 168 hours. No  results for input(s): "AMMONIA" in the last 168 hours. Coagulation Profile: Recent Labs  Lab 09/27/21 1506  INR 1.3*   Cardiac Enzymes: No results for input(s): "CKTOTAL", "CKMB", "CKMBINDEX", "TROPONINI" in the last 168 hours. BNP (last 3 results) No results for input(s): "PROBNP" in the last 8760 hours. HbA1C: No results for input(s): "HGBA1C" in the last 72 hours. CBG: No results for input(s): "GLUCAP" in the last 168 hours. Lipid  Profile: No results for input(s): "CHOL", "HDL", "LDLCALC", "TRIG", "CHOLHDL", "LDLDIRECT" in the last 72 hours. Thyroid Function Tests: No results for input(s): "TSH", "T4TOTAL", "FREET4", "T3FREE", "THYROIDAB" in the last 72 hours. Anemia Panel: No results for input(s): "VITAMINB12", "FOLATE", "FERRITIN", "TIBC", "IRON", "RETICCTPCT" in the last 72 hours. Sepsis Labs: Recent Labs  Lab 09/27/21 1506 09/27/21 1953  PROCALCITON  --  0.36  LATICACIDVEN 1.3  --     Recent Results (from the past 240 hour(s))  Blood Culture (routine x 2)     Status: None (Preliminary result)   Collection Time: 09/27/21  4:40 PM   Specimen: BLOOD  Result Value Ref Range Status   Specimen Description   Final    BLOOD LEFT ANTECUBITAL Performed at [facility], [place] [address] [place], [place] [address]    Special Requests   Final    BOTTLES DRAWN AEROBIC AND ANAEROBIC Blood Culture results may not be optimal due to an inadequate volume of blood received in culture bottles Performed at [facility] [address] [place], [place] [address]    Culture   Final    NO GROWTH 2 DAYS Performed at [facility] [address], [place], [place] [address]    Report Status PENDING  Incomplete  Blood Culture (routine x 2)     Status: None (Preliminary result)   Collection Time: 09/27/21  4:45 PM   Specimen: BLOOD  Result Value Ref Range Status   Specimen Description   Final    BLOOD BLOOD LEFT FOREARM Performed at [facility] [address], [place], [place] [address]    Special Requests   Final    BOTTLES DRAWN AEROBIC ONLY Blood Culture adequate volume Performed at [facility] [address], [place], [place] [address]    Culture   Final    NO GROWTH 2 DAYS Performed at [facility] [address] [place], [place] [address]    Report Status PENDING  Incomplete  Urine Culture     Status: Abnormal (Preliminary result)   Collection Time: 09/27/21  7:53 PM   Specimen: In/Out Cath Urine  Result Value Ref Range Status   Specimen Description   Final    IN/OUT CATH URINE Performed at [facility] [address], [place], [place] [address]    Special Requests   Final    NONE Performed at [facility], [place] [address], [place], [place] [address]    Culture (A)  Final    40,000 COLONIES/mL PSEUDOMONAS AERUGINOSA SUSCEPTIBILITIES TO FOLLOW Performed at [facility], [place] [address] [place], [place] [address]    Report Status PENDING  Incomplete  MRSA Next Gen by PCR, Nasal     Status: Abnormal   Collection Time: 09/29/21  9:25 AM   Specimen: Nasal Mucosa; Nasal Swab  Result Value Ref Range Status   MRSA by PCR Next Gen DETECTED (A) NOT DETECTED Final    Comment: CRITICAL RESULT CALLED TO, READ BACK BY AND VERIFIED WITH: SILVA,J. RN ON 09/29/21 @ 1128 BY GOLSONM (NOTE) The GeneXpert MRSA Assay (FDA approved for NASAL specimens only), is one component of a comprehensive MRSA colonization surveillance program. It is not intended to diagnose MRSA infection nor to guide or monitor treatment for MRSA infections. Test performance is not FDA approved in patients less than 83 years old. Performed at [facility], [place] [address] [place], [place]  [address]     Radiology Studies: DG CHEST PORT 1 VIEW  Result Date: 09/29/2021 CLINICAL DATA:  Pneumonia.  History of lung cancer. EXAM: PORTABLE CHEST  1 VIEW COMPARISON:  Radiographs 09/27/2021 and 07/30/2021.  CT 09/19/2021. FINDINGS: 0508 hours. Left-sided Port-A-Cath appears unchanged at the level of the superior cavoatrial junction. Progressive volume loss and opacity in the right hemithorax which is now nearly completely opacified. The left lung remains clear. No evidence of left pleural effusion or pneumothorax. The right-sided mediastinal contours are obscured by the right hemithorax opacification. Patient has known mediastinal and right hilar adenopathy on recent CT. The bones appear unchanged.  Telemetry leads overlie the chest. IMPRESSION: 1. Progressive volume loss and opacification of the right hemithorax consistent with pneumonia and/or posterior obstructive pneumonitis from the central tumor demonstrated on CT. 2. The left lung remains clear. Electronically Signed   By: Richardean Sale M.D.   On: 09/29/2021 08:42    Scheduled Meds:  aspirin EC  81 mg Oral Daily   azithromycin  500 mg Oral Daily   Chlorhexidine Gluconate Cloth  6 each Topical Daily   docusate sodium  200 mg Oral Daily   enoxaparin (LOVENOX) injection  40 mg Subcutaneous Q24H   fluticasone furoate-vilanterol  1 puff Inhalation Daily   guaiFENesin  1,200 mg Oral BID   ipratropium  0.5 mg Nebulization Q6H   levalbuterol  0.63 mg Nebulization Q6H   methylPREDNISolone (SOLU-MEDROL) injection  40 mg Intravenous Q12H   rosuvastatin  20 mg Oral Daily   tamsulosin  0.4 mg Oral QPM   vitamin B-12  500 mcg Oral QPM   Continuous Infusions:  cefTRIAXone (ROCEPHIN)  IV 2 g (09/29/21 0921)    LOS: 2 days   Raiford Noble, DO [facility] Available via Epic secure chat 7am-7pm After these hours, please refer to coverage provider listed on [email or web address] 09/29/2021, 4:48 PM

## 2021-09-30 ENCOUNTER — Inpatient Hospital Stay (HOSPITAL_COMMUNITY): Payer: Medicare Other

## 2021-09-30 ENCOUNTER — Encounter (HOSPITAL_BASED_OUTPATIENT_CLINIC_OR_DEPARTMENT_OTHER): Payer: Self-pay

## 2021-09-30 DIAGNOSIS — I10 Essential (primary) hypertension: Secondary | ICD-10-CM | POA: Diagnosis not present

## 2021-09-30 DIAGNOSIS — E785 Hyperlipidemia, unspecified: Secondary | ICD-10-CM | POA: Diagnosis not present

## 2021-09-30 DIAGNOSIS — J9621 Acute and chronic respiratory failure with hypoxia: Secondary | ICD-10-CM | POA: Diagnosis not present

## 2021-09-30 DIAGNOSIS — C3490 Malignant neoplasm of unspecified part of unspecified bronchus or lung: Secondary | ICD-10-CM | POA: Diagnosis not present

## 2021-09-30 DIAGNOSIS — J189 Pneumonia, unspecified organism: Secondary | ICD-10-CM | POA: Diagnosis not present

## 2021-09-30 LAB — CBC WITH DIFFERENTIAL/PLATELET
Abs Immature Granulocytes: 0.05 10*3/uL (ref 0.00–0.07)
Basophils Absolute: 0 10*3/uL (ref 0.0–0.1)
Basophils Relative: 0 %
Eosinophils Absolute: 0 10*3/uL (ref 0.0–0.5)
Eosinophils Relative: 0 %
HCT: 29.5 % — ABNORMAL LOW (ref 39.0–52.0)
Hemoglobin: 9.4 g/dL — ABNORMAL LOW (ref 13.0–17.0)
Immature Granulocytes: 1 %
Lymphocytes Relative: 4 %
Lymphs Abs: 0.3 10*3/uL — ABNORMAL LOW (ref 0.7–4.0)
MCH: 31.9 pg (ref 26.0–34.0)
MCHC: 31.9 g/dL (ref 30.0–36.0)
MCV: 100 fL (ref 80.0–100.0)
Monocytes Absolute: 0.1 10*3/uL (ref 0.1–1.0)
Monocytes Relative: 1 %
Neutro Abs: 7.9 10*3/uL — ABNORMAL HIGH (ref 1.7–7.7)
Neutrophils Relative %: 94 %
Platelets: 157 10*3/uL (ref 150–400)
RBC: 2.95 MIL/uL — ABNORMAL LOW (ref 4.22–5.81)
RDW: 15.6 % — ABNORMAL HIGH (ref 11.5–15.5)
WBC: 8.4 10*3/uL (ref 4.0–10.5)
nRBC: 0 % (ref 0.0–0.2)

## 2021-09-30 LAB — COMPREHENSIVE METABOLIC PANEL
ALT: 26 U/L (ref 0–44)
AST: 25 U/L (ref 15–41)
Albumin: 2.6 g/dL — ABNORMAL LOW (ref 3.5–5.0)
Alkaline Phosphatase: 77 U/L (ref 38–126)
Anion gap: 7 (ref 5–15)
BUN: 15 mg/dL (ref 8–23)
CO2: 28 mmol/L (ref 22–32)
Calcium: 8.6 mg/dL — ABNORMAL LOW (ref 8.9–10.3)
Chloride: 104 mmol/L (ref 98–111)
Creatinine, Ser: 0.75 mg/dL (ref 0.61–1.24)
GFR, Estimated: >60 mL/min (ref >60)
Glucose, Bld: 167 mg/dL — ABNORMAL HIGH (ref 70–99)
Potassium: 4.2 mmol/L (ref 3.5–5.1)
Sodium: 139 mmol/L (ref 135–145)
Total Bilirubin: 0.4 mg/dL (ref 0.3–1.2)
Total Protein: 5.7 g/dL — ABNORMAL LOW (ref 6.5–8.1)

## 2021-09-30 LAB — URINE CULTURE: Culture: 40000 — AB

## 2021-09-30 LAB — PHOSPHORUS: Phosphorus: 2.6 mg/dL (ref 2.5–4.6)

## 2021-09-30 LAB — MAGNESIUM: Magnesium: 2.2 mg/dL (ref 1.7–2.4)

## 2021-09-30 MED ORDER — SODIUM CHLORIDE 0.9 % IV SOLN: INTRAVENOUS | Status: DC | PRN

## 2021-09-30 MED ORDER — FUROSEMIDE 10 MG/ML IJ SOLN
40.0000 mg | Freq: Once | INTRAMUSCULAR | Status: AC
  Administered 2021-09-30: 40 mg via INTRAVENOUS
  Filled 2021-09-30: qty 4

## 2021-09-30 MED ORDER — SODIUM CHLORIDE 3 % IN NEBU
4.0000 mL | INHALATION_SOLUTION | Freq: Two times a day (BID) | RESPIRATORY_TRACT | Status: DC
Start: 2021-09-30 — End: 2021-10-03
  Administered 2021-09-30 – 2021-10-03 (×6): 4 mL via RESPIRATORY_TRACT
  Filled 2021-09-30 (×9): qty 4

## 2021-09-30 MED ORDER — SODIUM CHLORIDE 3 % IN NEBU
4.0000 mL | INHALATION_SOLUTION | Freq: Every day | RESPIRATORY_TRACT | Status: DC
  Filled 2021-09-30: qty 4

## 2021-09-30 NOTE — Progress Notes (Signed)
PROGRESS NOTE    Gerald [name]  YHC:[id number] DOB: Oct 24, 1940 DOA: 09/27/2021 PCP: [name], DO   Brief Narrative:  HPI per Dr. Early Osmond on 09/27/21  Gerald [name] is a 81 y.o. male with medical history significant of COPD, small cell lung cancer, hypertension, hyperlipidemia, urinary retention, coronary artery disease, obesity present here with complaining of poor appetite and generalized weakness that this started last night.   As per patient's wife at the bedside: Patient always gets pneumonia whenever he gets the chemotherapy.  Last chemo was done on Tuesday.  He started having poor appetite and weak, no energy, chills and fever all 101 today therefore wife brought him to the ER for further evaluation and management.   He has chronic cough and sputum production.  He is on 2 L of oxygen via nasal cannulae at home at baseline.  He denies headache, blurry vision, chest pain, wheezing, nausea, vomiting, diarrhea, abdominal pain, recent sick contact.   ED Course: Upon arrival to ED: Patient had temperature of 100.6, tachycardic at 111, respiratory rate 18, oxygen saturation 96% on 2 L, COVID and flu pending.  CBC shows leukocytosis of 11.8, H&H 10.4/32.0, platelet: 242.  Sodium 134, creatinine 1.1, GFR: More than 60.  Chest x-ray shows progressive airspace opacity within the right lung base concerning for infection.  Patient was given IV fluids and Rocephin and azithromycin in ED.  [facility] consulted for admission for pneumonia.   **Interim History Currently being treated for his pneumonia and feels a little bit better however he desaturated quite a bit on ambulatory home O2 screen.  Patient has some rhonchi noted and some coarse breath sounds so we will stop his Incruse Ellipta and start scheduled Xopenex and Atrovent as well as Solu-Medrol and add guaifenesin and flutter valve and incentive spirometry.  We will adjust his nebs given his respiratory sounds heard on his  auscultation but if he is not improving we will need to consult pulmonary for further evaluation and recommendations.  His chest x-ray showed almost complete opacification on the right side so pulmonary is consulted.  This morning I gave him some hypertonic saline nebs and also give him a dose of IV Lasix.  Pulmonary recommends continuing antibiotics and bronchodilators as well as steroids and recommending hypertonic saline nebs now twice daily.  They have recommended percussive vest every 4 and repeating chest x-ray in the morning.  If she does not improve pulmonary may recommend a bronchial airway inspection and possible debulking however patient may not agree to this.    Assessment and Plan: No notes have been filed under this hospital service. Service: Hospitalist  Sepsis secondary to right lower lobe pneumonia: Acute on chronic hypoxemic respiratory failure -Patient has history of small cell lung cancer on chemotherapy.   -On 2 to 3 L of oxygen at home.   -Currently requiring the same amount of oxygen however he desaturated quite a bit on ambulation.  Reviewed chest x-ray and I am in agreement that the radiologist read as it showed "Progressive airspace opacity within the right lung base concerning for infection. Chronic small right pleural effusion" -Continue with his Breo Ellipta but will discontinue Incruse Ellipta and start nebulized treatments as below -We will add as needed nebs if necessary -SpO2: 99 % O2 Flow Rate (L/min): 2 L/min FiO2 (%): 28 % -He was febrile with temperature of 100.6 and tachycardic.  WBC of 11,000 on admission, lactic acid: WNL on admission -Continue Rocephin and azithromycin.   -WBC  trended down and now trended back up slowly is now 11.6 yesterday but it has improved today to 8.4 -Get blood culture, procalcitonin, urine Legionella and urine strep antigen. -Continue IV fluids as he is receiving 125 MLS per hour but will reduce the rate to 75 MLS per hour for  12 more hours -We will add flutter valve, incentive spirometry and guaifenesin -Procalcitonin level 0.36 and is trending down slowly -Blood cultures x2 showed no growth at 3 days and urine culture now showing 40,000 colony-forming units of Pseudomonas aeruginosa -MRSA PCR is positive -Given that I do not feel that his urine is causing issues I feel that he has asymptomatic bacteriuria and do not feel inclined to treat -Repeat chest x-ray in the a.m. and will need an amatory home O2 screen prior to discharge -Repeat chest x-ray yesterday AM showed "Progressive volume loss and opacification of the right hemithorax consistent with pneumonia and/or posterior obstructive pneumonitis from the central tumor demonstrated on CT.  The left lung remains clear." -Pete chest x-ray today showed "Persistent near complete right lung opacification with minimal improvement in upper lung aeration." -Given his continued rhonchi we have changed his breathing treatments and added Xopenex and Atrovent and discontinued his Incruse Ellipta; also added scheduled Solu-Medrol 40 mg every 12 and guaifenesin 1200 mg p.o. 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 Mckinley Jewel -On chemo every 3 weeks.  Last chemo was on 09/24/2021 -We will notify Dr. Earlie Server still remains hospitalized through the weekend -See above   History of urinary retention -Continue Tamsulosin 0.4 mg p.o. nightly   Hyperlipidemia -Continue Rosuvastatin 20 mg p.o. nightly   Normocytic anemia:  -Patient's hemoglobin/hematocrit has been relatively  stable did drop a little bit likely from a dilutional drop -Hemoglobin/hematocrit went from 10.4/32.0 on admission and is now 9.5/29.6 and slowly still trending down and yesterday is now 8.9/27.8 with today being improved back to 9.4/29.5 -Check anemia panel in a.m. -Continue to monitor for signs and symptoms of bleeding; no overt bleeding noted -Repeat CBC in a.m.   Hyponatremia -Mild at 134 and improved to 139 -Continue monitor and trend and repeat CMP in a.m.   History of COPD -Continue home inhalers but will discontinue his Incruse Ellipta and start Xopenex and Atrovent every 6.  Followed by [facility] outpatient and may need pulmonary further evaluation if he is not improving -Continue monitor respiratory status carefully -See above  DVT prophylaxis: enoxaparin (LOVENOX) injection 40 mg Start: 09/27/21 2200 SCDs Start: 09/27/21 1757    Code Status: Full Code Family Communication: No family currently at bedside  Disposition Plan:  Level of care: Progressive Status is: Inpatient Remains inpatient appropriate because: Needs further evaluation and improvement and clearance by pulmonary   Consultants:  Pulmonary  Procedures:  None  Antimicrobials:  Anti-infectives (From admission, onward)    Start     Dose/Rate Route Frequency Ordered Stop   09/28/21 1000  cefTRIAXone (ROCEPHIN) 2 g in sodium chloride 0.9 % 100 mL IVPB        2 g 200 mL/hr over 30 Minutes  Intravenous Every 24 hours 09/27/21 1758 10/03/21 0959   09/28/21 1000  azithromycin (ZITHROMAX) tablet 500 mg        500 mg Oral Daily 09/27/21 1758 10/03/21 0959   09/27/21 1615  azithromycin (ZITHROMAX) 500 mg in sodium chloride 0.9 % 250 mL IVPB  Status:  Discontinued        500 mg 250 mL/hr over 60 Minutes Intravenous Every 24 hours 09/27/21 1608 09/28/21 0829   09/27/21 1615  cefTRIAXone (ROCEPHIN) 1 g in sodium chloride 0.9 % 100 mL IVPB  Status:  Discontinued        1 g 200 mL/hr over 30 Minutes Intravenous Every 24  hours 09/27/21 1608 09/28/21 0830       Subjective: Seen and examined at bedside and he felt about the same.  States he did not feel much better.  Denies any chest pain.  Thinks he is doing okay.  No other concerns or complaints at this time.  Objective: Vitals:   09/29/21 1948 09/30/21 0440 09/30/21 0752 09/30/21 1231  BP:  125/67  109/62  Pulse:  73  84  Resp:  (!) 22  18  Temp:  97.6 F (36.4 C)  98.1 F (36.7 C)  TempSrc:    Oral  SpO2: 92% 100% 97% 99%  Weight:      Height:        Intake/Output Summary (Last 24 hours) at 09/30/2021 1849 Last data filed at 09/30/2021 1800 Gross per 24 hour  Intake 770.06 ml  Output 2250 ml  Net -1479.94 ml   Filed Weights   09/27/21 1458  Weight: 90.7 kg   Examination: Physical Exam:  Constitutional: Overweight chronically ill-appearing Caucasian male currently no acute distress appears calm sitting in the chair bedside on supplemental oxygen Respiratory: Diminished to auscultation bilaterally with coarse breath sounds worse on the right compared to the left with some crackles and some slight rhonchi, no wheezing, rales. Normal respiratory effort and patient is not tachypenic. No accessory muscle use.  Unlabored breathing the patient is wearing supplemental oxygen via nasal cannula Cardiovascular: RRR, no murmurs / rubs / gallops. S1 and S2 auscultated.  Trace extremity edema Abdomen: Soft, non-tender, distended secondary body habitus. Bowel sounds positive.  GU: Deferred. Musculoskeletal: No clubbing / cyanosis of digits/nails. No joint deformity upper and lower extremities. Skin: No rashes, lesions, ulcers on limited skin evaluation. No induration; Warm and dry.  Neurologic: CN 2-12 grossly intact with no focal deficits.  Romberg sign and cerebellar reflexes not assessed.  Psychiatric: Normal judgment and insight. Alert and oriented x 3. Normal mood and appropriate affect.   Data Reviewed: I have personally reviewed following labs and  imaging studies  CBC: Recent Labs  Lab 09/24/21 1026 09/27/21 1506 09/27/21 1953 09/28/21 0436 09/29/21 0413 09/30/21 0332  WBC 5.0 11.8* 10.6* 10.5 11.6* 8.4  NEUTROABS 2.8 10.4*  --  9.3* 10.5* 7.9*  HGB 11.0* 10.4* 9.4* 9.5* 8.9* 9.4*  HCT 32.9* 32.0* 29.1* 29.6* 27.8* 29.5*  MCV 97.1 98.8 99.0 101.0* 99.6 100.0  PLT 262 242 200 187 160 778   Basic Metabolic Panel: Recent Labs  Lab 09/24/21 1026 09/27/21 1506 09/27/21 1953 09/28/21 0436 09/29/21 0413 09/30/21 0332  NA 136 134*  --  134* 134* 139  K 4.2 4.4  --  4.1 3.9 4.2  CL 101 100  --  101 102 104  CO2 30 27  --  27 27 28   GLUCOSE 98 111*  --  102* 111* 167*  BUN 19 22  --  20 16 15   CREATININE 0.90 1.11 1.12 0.88 0.90 0.75  CALCIUM 9.2 8.7*  --  8.2* 8.0* 8.6*  MG  --   --   --   --  2.0 2.2  PHOS  --   --   --   --  2.6 2.6   GFR: Estimated Creatinine Clearance: 76.8 mL/min (by C-G formula based on SCr of 0.75 mg/dL). Liver Function Tests: Recent Labs  Lab 09/24/21 1026 09/27/21 1506 09/29/21 0413 09/30/21 0332  AST 21 41 22 25  ALT 15 39 22 26  ALKPHOS 75 72 62 77  BILITOT 0.3 0.9 0.8 0.4  PROT 6.6 6.4* 5.4* 5.7*  ALBUMIN 3.7 3.2* 2.5* 2.6*   No results for input(s): "LIPASE", "AMYLASE" in the last 168 hours. No results for input(s): "AMMONIA" in the last 168 hours. Coagulation Profile: Recent Labs  Lab 09/27/21 1506  INR 1.3*   Cardiac Enzymes: No results for input(s): "CKTOTAL", "CKMB", "CKMBINDEX", "TROPONINI" in the last 168 hours. BNP (last 3 results) No results for input(s): "PROBNP" in the last 8760 hours. HbA1C: No results for input(s): "HGBA1C" in the last 72 hours. CBG: No results for input(s): "GLUCAP" in the last 168 hours. Lipid Profile: No results for input(s): "CHOL", "HDL", "LDLCALC", "TRIG", "CHOLHDL", "LDLDIRECT" in the last 72 hours. Thyroid Function Tests: No results for input(s): "TSH", "T4TOTAL", "FREET4", "T3FREE", "THYROIDAB" in the last 72 hours. Anemia  Panel: No results for input(s): "VITAMINB12", "FOLATE", "FERRITIN", "TIBC", "IRON", "RETICCTPCT" in the last 72 hours. Sepsis Labs: Recent Labs  Lab 09/27/21 1506 09/27/21 1953  PROCALCITON  --  0.36  LATICACIDVEN 1.3  --     Recent Results (from the past 240 hour(s))  Blood Culture (routine x 2)     Status: None (Preliminary result)   Collection Time: 09/27/21  4:40 PM   Specimen: BLOOD  Result Value Ref Range Status   Specimen Description   Final    BLOOD LEFT ANTECUBITAL Performed at [facility], [place] [address], [place], [place] [address]    Special Requests   Final    BOTTLES DRAWN AEROBIC AND ANAEROBIC Blood Culture results may not be optimal due to an inadequate volume of blood received in culture bottles Performed at [place] [address], [place], [place] [address]    Culture   Final    NO GROWTH 3 DAYS Performed at [facility], [place] [address] [place], [place] [address]    Report Status PENDING  Incomplete  Blood Culture (routine x 2)     Status: None (Preliminary result)   Collection Time: 09/27/21  4:45 PM   Specimen: BLOOD  Result Value Ref Range Status   Specimen Description   Final    BLOOD BLOOD LEFT FOREARM Performed at [place] [address] [place], [place] [address]    Special Requests   Final    BOTTLES DRAWN AEROBIC ONLY Blood Culture adequate volume Performed at [place] [address], [place], [place] [address]    Culture   Final    NO GROWTH 3 DAYS Performed at [facility], [place] [address], [place],  [address]    Report Status PENDING  Incomplete  Urine Culture     Status: Abnormal   Collection Time: 09/27/21  7:53 PM   Specimen: In/Out Cath Urine  Result Value Ref Range Status   Specimen Description   Final  IN/OUT CATH URINE Performed at [facility], [place] [address], [place], [place] [address]     Special Requests   Final    NONE Performed at [facility], [place] [address], [place], [place] [address]    Culture 40,000 COLONIES/mL PSEUDOMONAS AERUGINOSA (A)  Final   Report Status 09/30/2021 FINAL  Final   Organism ID, Bacteria PSEUDOMONAS AERUGINOSA (A)  Final      Susceptibility   Pseudomonas aeruginosa - MIC*    CEFTAZIDIME 2 SENSITIVE Sensitive     CIPROFLOXACIN <=0.25 SENSITIVE Sensitive     GENTAMICIN <=1 SENSITIVE Sensitive     IMIPENEM <=0.25 SENSITIVE Sensitive     PIP/TAZO <=4 SENSITIVE Sensitive     CEFEPIME 2 SENSITIVE Sensitive     * 40,000 COLONIES/mL PSEUDOMONAS AERUGINOSA  MRSA Next Gen by PCR, Nasal     Status: Abnormal   Collection Time: 09/29/21  9:25 AM   Specimen: Nasal Mucosa; Nasal Swab  Result Value Ref Range Status   MRSA by PCR Next Gen DETECTED (A) NOT DETECTED Final    Comment: CRITICAL RESULT CALLED TO, READ BACK BY AND VERIFIED WITH: SILVA,J. RN ON 09/29/21 @ 1128 BY GOLSONM (NOTE) The GeneXpert MRSA Assay (FDA approved for NASAL specimens only), is one component of a comprehensive MRSA colonization surveillance program. It is not intended to diagnose MRSA infection nor to guide or monitor treatment for MRSA infections. Test performance is not FDA approved in patients less than 31 years old. Performed at [facility], [place] [address] [place], [place] [address]   SARS Coronavirus 2 by RT PCR (hospital order, performed in [facility] hospital lab) *cepheid single result test*     Status: None   Collection Time: 09/29/21  9:01 PM   Specimen: Nasal Swab  Result Value Ref Range Status   SARS Coronavirus 2 by RT PCR NEGATIVE NEGATIVE Final    Comment: (NOTE) SARS-CoV-2 target nucleic acids are NOT DETECTED.  The SARS-CoV-2 RNA is generally detectable in upper and lower respiratory specimens during the acute phase of infection. The lowest concentration of SARS-CoV-2 viral copies this assay can detect is  250 copies / mL. A negative result does not preclude SARS-CoV-2 infection and should not be used as the sole basis for treatment or other patient management decisions.  A negative result may occur with improper specimen collection / handling, submission of specimen other than nasopharyngeal swab, presence of viral mutation(s) within the areas targeted by this assay, and inadequate number of viral copies (<250 copies / mL). A negative result must be combined with clinical observations, patient history, and epidemiological information.  Fact Sheet for Patients:   https://www.patel.info/  Fact Sheet for Healthcare Providers: https://hall.com/  This test is not yet approved or  cleared by the [place] FDA and has been authorized for detection and/or diagnosis of SARS-CoV-2 by FDA under an Emergency Use Authorization (EUA).  This EUA will remain in effect (meaning this test can be used) for the duration of the COVID-19 declaration under Section 564(b)(1) of the Act, 21 U.S.C. section 360bbb-3(b)(1), unless the authorization is terminated or revoked sooner.  Performed at [facility], [place] [address] [place],  [address]     Radiology Studies: DG CHEST PORT 1 VIEW  Result Date: 09/30/2021 CLINICAL DATA:  Pneumonia EXAM: PORTABLE CHEST 1 VIEW COMPARISON:  Radiograph 09/29/2021 FINDINGS: Persistent near complete right lung opacification with minimal improvement in upper lung aeration. Left lung is clear. Unchanged  chest port catheter. Unchanged cardiomediastinal silhouette. No pneumothorax. Bones are unchanged. IMPRESSION: Persistent near complete right lung opacification with minimal improvement in upper lung aeration. Electronically Signed   By: Maurine Simmering M.D.   On: 09/30/2021 08:19   DG CHEST PORT 1 VIEW  Result Date: 09/29/2021 CLINICAL DATA:  Pneumonia.  History of lung cancer. EXAM: PORTABLE CHEST 1 VIEW  COMPARISON:  Radiographs 09/27/2021 and 07/30/2021.  CT 09/19/2021. FINDINGS: 0508 hours. Left-sided Port-A-Cath appears unchanged at the level of the superior cavoatrial junction. Progressive volume loss and opacity in the right hemithorax which is now nearly completely opacified. The left lung remains clear. No evidence of left pleural effusion or pneumothorax. The right-sided mediastinal contours are obscured by the right hemithorax opacification. Patient has known mediastinal and right hilar adenopathy on recent CT. The bones appear unchanged.  Telemetry leads overlie the chest. IMPRESSION: 1. Progressive volume loss and opacification of the right hemithorax consistent with pneumonia and/or posterior obstructive pneumonitis from the central tumor demonstrated on CT. 2. The left lung remains clear. Electronically Signed   By: Richardean Sale M.D.   On: 09/29/2021 08:42    Scheduled Meds:  aspirin EC  81 mg Oral Daily   azithromycin  500 mg Oral Daily   Chlorhexidine Gluconate Cloth  6 each Topical Daily   docusate sodium  200 mg Oral Daily   enoxaparin (LOVENOX) injection  40 mg Subcutaneous Q24H   fluticasone furoate-vilanterol  1 puff Inhalation Daily   guaiFENesin  1,200 mg Oral BID   ipratropium  0.5 mg Nebulization TID   levalbuterol  0.63 mg Nebulization TID   methylPREDNISolone (SOLU-MEDROL) injection  40 mg Intravenous Q12H   mupirocin ointment  1 Application Nasal BID   rosuvastatin  20 mg Oral Daily   sodium chloride HYPERTONIC  4 mL Nebulization BID   tamsulosin  0.4 mg Oral QPM   vitamin B-12  500 mcg Oral QPM   Continuous Infusions:  sodium chloride     cefTRIAXone (ROCEPHIN)  IV 2 g (09/30/21 0947)    LOS: 3 days   Raiford Noble, DO [facility] Available via Epic secure chat 7am-7pm After these hours, please refer to coverage provider listed on [email or web address] 09/30/2021, 6:49 PM

## 2021-09-30 NOTE — TOC Initial Note (Signed)
Transition of Care [facility]) - Initial/Assessment Note    Patient Details  Name: Gerald [name] MRN: [id number] Date of Birth: 1940-08-03  Transition of Care [facility]) CM/SW Contact:    Leeroy Cha, RN Phone Number: 09/30/2021, 8:31 AM  Clinical Narrative:                  Transition of Care [facility]) Screening Note   Patient Details  Name: Gerald [name] Date of Birth: 12-18-1940   Transition of Care [facility]) CM/SW Contact:    Leeroy Cha, RN Phone Number: 09/30/2021, 8:31 AM    Transition of Care Department [facility]) has reviewed patient and no TOC needs have been identified at this time. We will continue to monitor patient advancement through interdisciplinary progression rounds. If new patient transition needs arise, please place a TOC consult.    Expected Discharge Plan: Home/Self Care Barriers to Discharge: Continued Medical Work up   Patient Goals and CMS Choice Patient states their goals for this hospitalization and ongoing recovery are:: to go home CMS Medicare.gov Compare Post Acute Care list provided to:: Patient    Expected Discharge Plan and Services Expected Discharge Plan: Home/Self Care   Discharge Planning Services: CM Consult   Living arrangements for the past 2 months: Single Family Home                                      Prior Living Arrangements/Services Living arrangements for the past 2 months: Single Family Home Lives with:: Spouse Patient language and need for interpreter reviewed:: Yes Do you feel safe going back to the place where you live?: Yes            Criminal Activity/Legal Involvement Pertinent to Current Situation/Hospitalization: No - Comment as needed  Activities of Daily Living Home Assistive Devices/Equipment: Cane (specify quad or straight), Walker (specify type), Eyeglasses, Shower chair with back, Oxygen ADL Screening (condition at time of admission) Patient's cognitive ability adequate to safely complete daily  activities?: Yes Is the patient deaf or have difficulty hearing?: No Does the patient have difficulty seeing, even when wearing glasses/contacts?: No Does the patient have difficulty concentrating, remembering, or making decisions?: No Patient able to express need for assistance with ADLs?: Yes Does the patient have difficulty dressing or bathing?: No Independently performs ADLs?: No Communication: Independent Dressing (OT): Needs assistance Is this a change from baseline?: Change from baseline, expected to last >3 days Grooming: Independent Feeding: Independent Bathing: Needs assistance Is this a change from baseline?: Change from baseline, expected to last >3 days Toileting: Needs assistance Is this a change from baseline?: Change from baseline, expected to last >3days In/Out Bed: Needs assistance Is this a change from baseline?: Change from baseline, expected to last >3 days Walks in Home: Needs assistance Is this a change from baseline?: Change from baseline, expected to last >3 days Does the patient have difficulty walking or climbing stairs?: Yes Weakness of Legs: Both Weakness of Arms/Hands: Both  Permission Sought/Granted                  Emotional Assessment Appearance:: Appears stated age     Orientation: : Oriented to  Time, Oriented to Situation, Oriented to Place, Oriented to Self Alcohol / Substance Use: Not Applicable Psych Involvement: No (comment)  Admission diagnosis:  Pneumonia [J18.9] Community acquired pneumonia, unspecified laterality [J18.9] Patient Active Problem List   Diagnosis Date Noted  Pressure injury of skin 09/29/2021   Pneumonia 09/27/2021   COPD (chronic obstructive pulmonary disease) ([place]) 07/28/2021   HLD (hyperlipidemia) 07/27/2021   History of urinary retention 07/27/2021   HTN (hypertension) 07/24/2021   Constipation 07/24/2021   Port-A-Cath in place 05/28/2021   Gait instability 05/10/2021   Elevated LFTs 05/09/2021    Pancytopenia ([place]) 04/29/2021   Liver function abnormality 04/27/2021   Acute urinary retention 04/26/2021   Sepsis ([place]) 04/25/2021   Exudative pleural effusion 04/25/2021   AKI (acute kidney injury) ([place]) 04/25/2021   Ectropion 04/25/2021   Blepharitis 04/25/2021   Multifocal pneumonia 04/24/2021   Encounter for antineoplastic chemotherapy 04/22/2021   Encounter for antineoplastic immunotherapy 04/22/2021   Small cell lung cancer ([place]) 04/10/2021   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[place]) 04/10/2021   Adenopathy 03/29/2021   Endobronchial cancer, right ([place])    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 03/18/2021   History of bradycardia 12/06/2020   Coronary artery disease due to calcified coronary lesion 12/06/2020   Pure hypercholesterolemia 12/06/2020   Achilles tendon pain 10/08/2020   Psoriasis 09/18/2020   Ischemic cardiomyopathy    Centrilobular emphysema ([place]) 10/26/2019   Tobacco abuse 10/26/2019   Goals of care, counseling/discussion 10/15/2019   Primary cancer of right lower lobe of lung ([place]) 10/12/2019   Lung nodule 09/27/2019   Chronic respiratory insufficiency 07/21/2016   PCP:  [name], DO Pharmacy:   [facility] [address], [place] [address] Phone: [phone] Fax: [phone]     Social Determinants of Health (SDOH) Interventions    Readmission Risk Interventions    07/31/2021   12:49 PM  Readmission Risk Prevention Plan  Transportation Screening Complete  PCP or Specialist Appt within 5-7 Days Complete  Home Care Screening Complete  Medication Review (RN CM) Complete

## 2021-09-30 NOTE — Consult Note (Signed)
NAME:  Gerald [name], MRN:  [id number], DOB:  October 28, 1940, LOS: 3 ADMISSION DATE:  09/27/2021, CONSULTATION DATE:  09/30/21 REFERRING MD:  Alfredia Ferguson, CHIEF COMPLAINT:  weakness   History of Present Illness:  81yM with history of   Pertinent  Medical History  COPD, small cell lung cancer, hypertension, hyperlipidemia, urinary retention, coronary artery disease, obesity present here with complaining of poor appetite and generalized weakness that this started night PTA.   Per pt's wife typically gets pneumonia whenever he gets chemotherapy (which was Tue PTA). Then had anorexia, weakness, fever through the day of admission. Here he was started on ceftriaxone and azithromycin and he has defervesced since 6/30. We were consulted for abnormal chest x-ray today.  Significant Hospital Events: Including procedures, antibiotic start and stop dates in addition to other pertinent events   6/30-7/3 admitted started on CAP coverage and pulmonary toilet   Interim History / Subjective:    Objective   Blood pressure 125/67, pulse 73, temperature 97.6 F (36.4 C), resp. rate (!) 22, height 6' (1.829 m), weight 90.7 kg, SpO2 97 %.    FiO2 (%):  [28 %] 28 %   Intake/Output Summary (Last 24 hours) at 09/30/2021 8413 Last data filed at 09/30/2021 0600 Gross per 24 hour  Intake 340 ml  Output 1400 ml  Net -1060 ml   Filed Weights   09/27/21 1458  Weight: 90.7 kg    Examination: General appearance: 81 y.o., male, NAD, conversant  Eyes: PERRL, tracking appropriately HENT: NCAT; MMM Neck: Trachea midline; no lymphadenopathy, no JVD Lungs: rhonchorous but diminished on R, with normal respiratory effort CV: RRR, no murmur  Abdomen: Soft, non-tender; non-distended, BS present  Extremities: No peripheral edema, warm Skin: Normal turgor and texture; no rash Psych: Appropriate affect Neuro: Alert and oriented to person and place, no focal deficit    Resolved Hospital Problem list     Assessment & Plan:    # RLL, RML, RUL atelectasis # CAP, post obstructive pneumonia - continue CTX - bronchodilators, steroids - HTS BID - vest q4 - left lung down for postural drainage - doesn't sound like he'd want to undergo bronch/airway inspection/possible debulking if fails to clear up but we'll continue to follow  Best Practice (right click and "Reselect all SmartList Selections" daily)   Per [facility]  Labs   CBC: Recent Labs  Lab 09/24/21 1026 09/27/21 1506 09/27/21 1953 09/28/21 0436 09/29/21 0413 09/30/21 0332  WBC 5.0 11.8* 10.6* 10.5 11.6* 8.4  NEUTROABS 2.8 10.4*  --  9.3* 10.5* 7.9*  HGB 11.0* 10.4* 9.4* 9.5* 8.9* 9.4*  HCT 32.9* 32.0* 29.1* 29.6* 27.8* 29.5*  MCV 97.1 98.8 99.0 101.0* 99.6 100.0  PLT 262 242 200 187 160 244    Basic Metabolic Panel: Recent Labs  Lab 09/24/21 1026 09/27/21 1506 09/27/21 1953 09/28/21 0436 09/29/21 0413 09/30/21 0332  NA 136 134*  --  134* 134* 139  K 4.2 4.4  --  4.1 3.9 4.2  CL 101 100  --  101 102 104  CO2 30 27  --  27 27 28   GLUCOSE 98 111*  --  102* 111* 167*  BUN 19 22  --  20 16 15   CREATININE 0.90 1.11 1.12 0.88 0.90 0.75  CALCIUM 9.2 8.7*  --  8.2* 8.0* 8.6*  MG  --   --   --   --  2.0 2.2  PHOS  --   --   --   --  2.6 2.6  GFR: Estimated Creatinine Clearance: 76.8 mL/min (by C-G formula based on SCr of 0.75 mg/dL). Recent Labs  Lab 09/27/21 1506 09/27/21 1953 09/28/21 0436 09/29/21 0413 09/30/21 0332  PROCALCITON  --  0.36  --   --   --   WBC 11.8* 10.6* 10.5 11.6* 8.4  LATICACIDVEN 1.3  --   --   --   --     Liver Function Tests: Recent Labs  Lab 09/24/21 1026 09/27/21 1506 09/29/21 0413 09/30/21 0332  AST 21 41 22 25  ALT 15 39 22 26  ALKPHOS 75 72 62 77  BILITOT 0.3 0.9 0.8 0.4  PROT 6.6 6.4* 5.4* 5.7*  ALBUMIN 3.7 3.2* 2.5* 2.6*   No results for input(s): "LIPASE", "AMYLASE" in the last 168 hours. No results for input(s): "AMMONIA" in the last 168 hours.  ABG    Component Value Date/Time    PHART 7.281 (L) 02/09/2020 0939   PCO2ART 65.4 (HH) 02/09/2020 0939   PO2ART 102 02/09/2020 0939   HCO3 30.8 (H) 02/09/2020 0942   TCO2 33 (H) 02/09/2020 0942   O2SAT 72.0 02/09/2020 0942     Coagulation Profile: Recent Labs  Lab 09/27/21 1506  INR 1.3*    Cardiac Enzymes: No results for input(s): "CKTOTAL", "CKMB", "CKMBINDEX", "TROPONINI" in the last 168 hours.  HbA1C: No results found for: "HGBA1C"  CBG: No results for input(s): "GLUCAP" in the last 168 hours.  Review of Systems:   12 point review of systems is negative except as in HPI  Past Medical History:  He,  has a past medical history of Bradycardia, Bursitis, Carotid artery disease ([place]), COPD (chronic obstructive pulmonary disease) ([place]), Coronary artery disease, Hypercholesterolemia, Ischemic cardiomyopathy, Lung cancer ([place]), Lung nodule, nscl ca (dx'd 08/2019), Psoriasis, Tobacco abuse, and Wears glasses.   Surgical History:   Past Surgical History:  Procedure Laterality Date   BRONCHIAL BIOPSY  09/27/2019   Procedure: BRONCHIAL BIOPSIES;  Surgeon: Garner Nash, DO;  Location: [facility];  Service: Pulmonary;;   BRONCHIAL BRUSHINGS  09/27/2019   Procedure: BRONCHIAL BRUSHINGS;  Surgeon: Garner Nash, DO;  Location: [facility];  Service: Pulmonary;;   BRONCHIAL NEEDLE ASPIRATION BIOPSY  09/27/2019   Procedure: BRONCHIAL NEEDLE ASPIRATION BIOPSIES;  Surgeon: Garner Nash, DO;  Location: [facility];  Service: Pulmonary;;   BRONCHIAL NEEDLE ASPIRATION BIOPSY  03/29/2021   Procedure: BRONCHIAL NEEDLE ASPIRATION BIOPSIES;  Surgeon: Garner Nash, DO;  Location: [facility];  Service: Pulmonary;;   BRONCHIAL WASHINGS  09/27/2019   Procedure: BRONCHIAL WASHINGS;  Surgeon: Garner Nash, DO;  Location: [facility];  Service: Pulmonary;;   BRONCHIAL WASHINGS  03/29/2021   Procedure: BRONCHIAL WASHINGS;  Surgeon: Garner Nash, DO;  Location: [facility];  Service: Pulmonary;;    COLONOSCOPY     CRYOTHERAPY  09/27/2019   Procedure: C[place] Ok;  Surgeon: Garner Nash, DO;  Location: [facility];  Service: Pulmonary;;   CRYOTHERAPY  03/29/2021   Procedure: CRYOTHERAPY;  Surgeon: Garner Nash, DO;  Location: [facility];  Service: Pulmonary;;   FIDUCIAL MARKER PLACEMENT  09/27/2019   Procedure: FIDUCIAL MARKER PLACEMENT;  Surgeon: Garner Nash, DO;  Location: [facility];  Service: Pulmonary;;   HEMOSTASIS CONTROL  09/27/2019   Procedure: HEMOSTASIS CONTROL;  Surgeon: Garner Nash, DO;  Location: [facility];  Service: Pulmonary;;   HERNIA REPAIR     umbicial- no longer as a "belly button"   IR IMAGING GUIDED PORT INSERTION  05/21/2021   IR US  GUIDE VASC ACCESS LEFT  05/21/2021   RIGHT/LEFT HEART CATH AND CORONARY ANGIOGRAPHY N/A 02/09/2020   Procedure: RIGHT/LEFT HEART CATH AND CORONARY ANGIOGRAPHY;  Surgeon: Burnell Blanks, MD;  Location: [facility];  Service: Cardiovascular;  Laterality: N/A;   VIDEO BRONCHOSCOPY WITH ENDOBRONCHIAL NAVIGATION N/A 09/27/2019   Procedure: VIDEO BRONCHOSCOPY WITH ENDOBRONCHIAL NAVIGATION;  Surgeon: Garner Nash, DO;  Location: [facility];  Service: Pulmonary;  Laterality: N/A;   VIDEO BRONCHOSCOPY WITH ENDOBRONCHIAL ULTRASOUND N/A 03/29/2021   Procedure: VIDEO BRONCHOSCOPY WITH ENDOBRONCHIAL ULTRASOUND;  Surgeon: Garner Nash, DO;  Location: [facility];  Service: Pulmonary;  Laterality: N/A;     Social History:   reports that he has quit smoking. His smoking use included cigarettes. He has a 124.00 pack-year smoking history. He has been exposed to tobacco smoke. He has never used smokeless tobacco. He reports that he does not currently use alcohol. He reports that he does not use drugs.   Family History:  His family history includes Heart attack in his father; Lung cancer in his brother; Mesothelioma (age of onset: 89) in his sister.   Allergies Allergies  Allergen Reactions   Cosela  [Trilaciclib] Other (See Comments)    thrombophlebitis     Home Medications  Prior to Admission medications   Medication Sig Start Date End Date Taking? Authorizing Provider  aspirin EC 81 MG tablet Take 1 tablet (81 mg total) by mouth daily. Swallow whole. 01/26/20  Yes Buford Dresser, MD  clobetasol cream (TEMOVATE) 3.15 % Apply 1 application topically daily as needed (Psoriasis).   Yes [provider]  docusate sodium (COLACE) 100 MG capsule Take 200 mg by mouth daily.   Yes [provider]  Fluticasone-Umeclidin-Vilant (TRELEGY ELLIPTA) 100-62.5-25 MCG/ACT AEPB Inhale 1 puff into the lungs daily.   Yes [provider]  lidocaine-prilocaine (EMLA) cream Apply to the Port-A-Cath site 30-60-minute before chemotherapy. 09/10/21  Yes Walisiewicz, Kaitlyn E, PA-C  Multiple Vitamins-Minerals (PRESERVISION AREDS 2 PO) Take 1 tablet by mouth in the morning and at bedtime.   Yes [provider]  niacinamide 500 MG tablet Take 500 mg by mouth daily with breakfast.   Yes [provider]  polyethylene glycol (MIRALAX) 17 g packet Take 17 g by mouth daily as needed for moderate constipation.   Yes [provider]  prochlorperazine (COMPAZINE) 10 MG tablet Take 1 tablet (10 mg total) by mouth every 6 (six) hours as needed. Patient taking differently: Take 10 mg by mouth every 6 (six) hours as needed for vomiting or nausea. 04/10/21  Yes Heilingoetter, Cassandra L, PA-C  rosuvastatin (CRESTOR) 20 MG tablet Take 1 tablet (20 mg total) by mouth daily. 06/10/21 06/05/22 Yes Buford Dresser, MD  tamsulosin (FLOMAX) 0.4 MG CAPS capsule Take 0.4 mg by mouth every evening.   Yes [provider]  triamcinolone cream (KENALOG) 0.1 % Apply 1 application topically daily as needed (skin irritation.). 02/01/21  Yes [provider]  vitamin B-12 (CYANOCOBALAMIN) 500 MCG tablet Take 500 mcg by mouth every evening.   Yes [provider]  albuterol (VENTOLIN HFA) 108 (90 Base) MCG/ACT inhaler Inhale 2 puffs into the lungs every 6 (six) hours as needed for wheezing or shortness of breath. Patient not taking: Reported on 09/27/2021 07/31/21   Rai, Vernelle Emerald, MD  AMBULATORY NON FORMULARY MEDICATION Please provide 4 wheel rolling walker with seat for [name].  Diagnosis: R26.81 Gait instability 05/10/21   [name], DO  hydrocortisone 1 %  ointment Apply 1 application. topically 2 (two) times daily. To the rash. Please follow-up with your dermatologist. Patient not taking: Reported on 09/27/2021 07/31/21   Mendel Corning, MD     Critical care time: n/a

## 2021-10-01 ENCOUNTER — Inpatient Hospital Stay (HOSPITAL_COMMUNITY): Payer: Medicare Other

## 2021-10-01 DIAGNOSIS — J9621 Acute and chronic respiratory failure with hypoxia: Secondary | ICD-10-CM | POA: Diagnosis not present

## 2021-10-01 DIAGNOSIS — E785 Hyperlipidemia, unspecified: Secondary | ICD-10-CM | POA: Diagnosis not present

## 2021-10-01 DIAGNOSIS — I10 Essential (primary) hypertension: Secondary | ICD-10-CM | POA: Diagnosis not present

## 2021-10-01 DIAGNOSIS — C3490 Malignant neoplasm of unspecified part of unspecified bronchus or lung: Secondary | ICD-10-CM | POA: Diagnosis not present

## 2021-10-01 MED ORDER — ACETAMINOPHEN 325 MG PO TABS
650.0000 mg | ORAL_TABLET | Freq: Four times a day (QID) | ORAL | Status: AC | PRN
  Administered 2021-10-01 – 2021-10-02 (×3): 650 mg via ORAL
  Filled 2021-10-01 (×3): qty 2

## 2021-10-01 NOTE — Progress Notes (Signed)
Patient ambulatory in the hallway, 96% on room air at rest, dropped to 75% with ambulation, did report some dyspnea.  Patient rebounded quickly at rest up to 98% on 2 liters.

## 2021-10-01 NOTE — Progress Notes (Signed)
Pt has declined Chest Vest for tonight.

## 2021-10-01 NOTE — Progress Notes (Signed)
Physical Therapy Treatment Patient Details Name: Gerald [name] MRN: [id number] DOB: 04-16-1940 Today's Date: 10/01/2021   History of Present Illness 81 yo male admitted with Pna, weakness, sepsis. Hx of COPD, lung ca, CAD    PT Comments    Progressing with mobility and activity tolerance. O2 93% 2L O2.    Recommendations for follow up therapy are one component of a multi-disciplinary discharge planning process, led by the attending physician.  Recommendations may be updated based on patient status, additional functional criteria and insurance authorization.  Follow Up Recommendations  No PT follow up     Assistance Recommended at Discharge PRN  Patient can return home with the following A little help with walking and/or transfers;A little help with bathing/dressing/bathroom;Assist for transportation;Assistance with cooking/housework   Equipment Recommendations  None recommended by PT    Recommendations for Other Services       Precautions / Restrictions Precautions Precautions: Fall Precaution Comments: monitor O2 Restrictions Weight Bearing Restrictions: No     Mobility  Bed Mobility               General bed mobility comments: oob in recliner    Transfers Overall transfer level: Needs assistance Equipment used: Rollator (4 wheels) Transfers: Sit to/from Stand Sit to Stand: Supervision                Ambulation/Gait Ambulation/Gait assistance: Min guard Gait Distance (Feet): 100 Feet Assistive device: Rollator (4 wheels) Gait Pattern/deviations: Step-through pattern, Decreased stride length       General Gait Details: O2 93% on 2L. Dyspnea 2/4   Stairs             Wheelchair Mobility    Modified Rankin (Stroke Patients Only)       Balance Overall balance assessment: Needs assistance         Standing balance support: Bilateral upper extremity supported, During functional activity, Reliant on assistive device for  balance Standing balance-Leahy Scale: Fair                              Cognition Arousal/Alertness: Awake/alert Behavior During Therapy: WFL for tasks assessed/performed Overall Cognitive Status: Within Functional Limits for tasks assessed                                          Exercises      General Comments        Pertinent Vitals/Pain Pain Assessment Pain Assessment: No/denies pain    Home Living                          Prior Function            PT Goals (current goals can now be found in the care plan section) Progress towards PT goals: Progressing toward goals    Frequency    Min 3X/week      PT Plan Current plan remains appropriate    Co-evaluation              AM-PAC PT "6 Clicks" Mobility   Outcome Measure  Help needed turning from your back to your side while in a flat bed without using bedrails?: A Little Help needed moving from lying on your back to sitting on the side of a flat bed without using bedrails?: A Little  Help needed moving to and from a bed to a chair (including a wheelchair)?: A Little Help needed standing up from a chair using your arms (e.g., wheelchair or bedside chair)?: A Little Help needed to walk in hospital room?: A Little Help needed climbing 3-5 steps with a railing? : A Little 6 Click Score: 18    End of Session Equipment Utilized During Treatment: Oxygen Activity Tolerance: Patient tolerated treatment well Patient left: in chair;with call bell/phone within reach   PT Visit Diagnosis: Difficulty in walking, not elsewhere classified (R26.2)     Time: 1020-1035 PT Time Calculation (min) (ACUTE ONLY): 15 min  Charges:  $Gait Training: 8-22 mins                        Doreatha Massed, PT Acute Rehabilitation  Office: [phone] Pager: [phone]

## 2021-10-01 NOTE — Progress Notes (Signed)
Brief PCCM Progress Note  Continue airway clearance measures, ABX, will check CXR tomorrow and he and wife will consider whether or not he'd be willing to undergo bronch for airway inspection, airway clearance and possible debulking if there is endobronchial tumor.  [place]

## 2021-10-01 NOTE — Progress Notes (Signed)
PROGRESS NOTE    Gerald [name]  GGE:[id number] DOB: [birth year] DOA: 09/27/2021 PCP: [name], DO   Brief Narrative:  HPI per Dr. Early Osmond on 09/27/21  Gerald [name] is a 81 y.o. male with medical history significant of COPD, small cell lung cancer, hypertension, hyperlipidemia, urinary retention, coronary artery disease, obesity present here with complaining of poor appetite and generalized weakness that this started last night.   As per patient's wife at the bedside: Patient always gets pneumonia whenever he gets the chemotherapy.  Last chemo was done on Tuesday.  He started having poor appetite and weak, no energy, chills and fever all 101 today therefore wife brought him to the ER for further evaluation and management.   He has chronic cough and sputum production.  He is on 2 L of oxygen via nasal cannulae at home at baseline.  He denies headache, blurry vision, chest pain, wheezing, nausea, vomiting, diarrhea, abdominal pain, recent sick contact.   ED Course: Upon arrival to ED: Patient had temperature of 100.6, tachycardic at 111, respiratory rate 18, oxygen saturation 96% on 2 L, COVID and flu pending.  CBC shows leukocytosis of 11.8, H&H 10.4/32.0, platelet: 242.  Sodium 134, creatinine 1.1, GFR: More than 60.  Chest x-ray shows progressive airspace opacity within the right lung base concerning for infection.  Patient was given IV fluids and Rocephin and azithromycin in ED.  [facility] consulted for admission for pneumonia.   **Interim History Currently being treated for his pneumonia and feels a little bit better however he desaturated quite a bit on ambulatory home O2 screen.  Patient has some rhonchi noted and some coarse breath sounds so we will stop his Incruse Ellipta and start scheduled Xopenex and Atrovent as well as Solu-Medrol and add guaifenesin and flutter valve and incentive spirometry.  We will adjust his nebs given his respiratory sounds heard on his  auscultation but if he is not improving we will need to consult pulmonary for further evaluation and recommendations.  His chest x-ray showed almost complete opacification on the right side so pulmonary is consulted.  Yesterday morning I gave him some hypertonic saline nebs and pulmonary is changing this to twice daily dosing and also give him a dose of IV Lasix.  Pulmonary recommends continuing antibiotics and bronchodilators as well as steroids and recommending hypertonic saline nebs now twice daily.  They have recommended percussive vest every 4 and repeating chest x-ray in the morning.  If he does not improve pulmonary may recommend a bronchial airway inspection and possible debulking however patient may not agree to this.  Repeat chest x-ray this a.m. showed "Similar volume loss right hemithorax with irregular circumferential right-sided pleural thickening given differential positioning between the 2 studies. Left lung remains hyperexpanded but relatively clear. The cardio pericardial silhouette is enlarged. Left Port-A-Cath again noted. Telemetry leads overlie the chest"  He is slowly improving and will need pulmonary clearance for safe discharge disposition.   Assessment and Plan:  Sepsis secondary to right lower lobe pneumonia: Acute on chronic hypoxemic respiratory failure -Patient has history of small cell lung cancer on chemotherapy.   -On 2 to 3 L of oxygen at home.   -Currently requiring the same amount of oxygen however he desaturated quite a bit on ambulation.  Reviewed chest x-ray and I am in agreement that the radiologist read as it showed "Progressive airspace opacity within the right lung base concerning for infection. Chronic small right pleural effusion" -Continue with his Breo Ellipta but  will discontinue Incruse Ellipta and start nebulized treatments as below -We will add as needed nebs if necessary -SpO2: 100 % O2 Flow Rate (L/min): 3 L/min FiO2 (%): 28 % -He was  febrile with temperature of 100.6 and tachycardic.  WBC of 11,000 on admission, lactic acid: WNL on admission -Continue Rocephin and azithromycin.   -WBC trended down and now 8.4 on last check  -Get blood culture, procalcitonin, urine Legionella and urine strep antigen. -Continue IV fluids as he is receiving 125 MLS per hour but will reduce the rate to 75 MLS per hour for 12 more hours -We will add flutter valve, incentive spirometry and guaifenesin -Procalcitonin level 0.36 and is trending down slowly -Blood cultures x2 showed no growth at 3 days and urine culture now showing 40,000 colony-forming units of Pseudomonas aeruginosa -MRSA PCR is positive -Given that I do not feel that his urine is causing issues I feel that he has asymptomatic bacteriuria and do not feel inclined to treat -Repeat chest x-ray in the a.m. and will need an Ambulatory home O2 screen prior to discharge -Repeat chest x-ray today showed "No substantial change in exam given differential positioning." -Given his continued rhonchi we have changed his breathing treatments and added Xopenex and Atrovent and discontinued his Incruse Ellipta; also added scheduled Solu-Medrol 40 mg every 12 and guaifenesin 1200 mg p.o. 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 Mckinley Jewel -On chemo every 3 weeks.  Last chemo was on 09/24/2021 -We will notify Dr. Earlie Server still remains hospitalized through the weekend -See above   History of urinary retention -Continue Tamsulosin 0.4 mg  p.o. nightly   Hyperlipidemia -Continue Rosuvastatin 20 mg p.o. nightly   Normocytic anemia:  -Patient's hemoglobin/hematocrit has been relatively stable did drop a little bit likely from a dilutional drop -Hemoglobin/hematocrit went from 10.4/32.0 -> 9.5/29.6 -> 8.9/27.8 -> 9.4/29.5 with repeat pending  -Check anemia panel in a.m. -Continue to monitor for signs and symptoms of bleeding; no overt bleeding noted -Repeat CBC in a.m.   Hyponatremia -Mild at 134 and improved to 139 on last check; Repeat Labs pending  -Continue monitor and trend and repeat CMP in a.m.   History of COPD -Continue home inhalers but will discontinue his Incruse Ellipta and start Xopenex and Atrovent every 6.  Followed by [facility] outpatient and may need pulmonary further evaluation if he is not improving -Continue monitor respiratory status carefully -See above   DVT prophylaxis: enoxaparin (LOVENOX) injection 40 mg Start: 09/27/21 2200 SCDs Start: 09/27/21 1757    Code Status: Full Code Family Communication: Discussed with Daughter at bedside   Disposition Plan:  Level of care: Progressive Status is: Inpatient Remains inpatient appropriate because: Continues to desaturate on ambulation and will repeat ambulatory home O2 screen prior to discharge and needs pulmonary clearance prior to safe discharge disposition   Consultants:  Pulmonary  Procedures:  None  Antimicrobials:  Anti-infectives (From admission, onward)    Start     Dose/Rate Route Frequency Ordered Stop   09/28/21 1000  cefTRIAXone (ROCEPHIN) 2 g in sodium chloride 0.9 % 100 mL IVPB        2 g 200 mL/hr over 30 Minutes Intravenous Every 24 hours 09/27/21 1758 10/03/21 0959   09/28/21 1000  azithromycin (ZITHROMAX) tablet 500 mg        500 mg Oral Daily 09/27/21 1758 10/03/21 0959   09/27/21 1615  azithromycin (ZITHROMAX) 500 mg in sodium chloride 0.9 % 250 mL IVPB  Status:  Discontinued        500 mg 250 mL/hr over 60 Minutes  Intravenous Every 24 hours 09/27/21 1608 09/28/21 0829   09/27/21 1615  cefTRIAXone (ROCEPHIN) 1 g in sodium chloride 0.9 % 100 mL IVPB  Status:  Discontinued        1 g 200 mL/hr over 30 Minutes Intravenous Every 24 hours 09/27/21 1608 09/28/21 0830       Subjective: Seen and examined at bedside and thinks he is doing little bit better but he desaturated quite a bit on ambulation.  No nausea or vomiting but thinks he is breathing easier.  Denies any chest pain.  No other concerns or plaints this time.  Objective: Vitals:   10/01/21 0640 10/01/21 0853 10/01/21 1234 10/01/21 1413  BP: (!) 108/50  (!) 98/56   Pulse: 78  87   Resp: 20  20   Temp: (!) 97.5 F (36.4 C)  97.7 F (36.5 C)   TempSrc: Oral  Oral   SpO2: 100% 98% 100% 100%  Weight:      Height:        Intake/Output Summary (Last 24 hours) at 10/01/2021 1639 Last data filed at 10/01/2021 3235 Gross per 24 hour  Intake 360 ml  Output 900 ml  Net -540 ml   Filed Weights   09/27/21 1458  Weight: 90.7 kg   Examination: Physical Exam:  Constitutional: Overweight chronically ill-appearing Caucasian male currently no acute distress appears calm Respiratory: Diminished to auscultation bilaterally with coarse breath sounds worse on the right compared to left with some crackles and some slight rhonchi, no wheezing, rales. Normal respiratory effort and patient is not tachypenic. No accessory muscle use.  Unlabored breathing and is wearing supplemental oxygen via nasal cannula Cardiovascular: RRR, no murmurs / rubs / gallops. S1 and S2 auscultated.  Mild lower extremity edema Abdomen: Soft, non-tender, non-distended. No masses palpated. No appreciable hepatosplenomegaly. Bowel sounds positive.  GU: Deferred. Musculoskeletal: No clubbing / cyanosis of digits/nails. No joint deformity upper and lower extremities.  Skin: No rashes, lesions, ulcers on limited skin evaluation. No induration; Warm and dry.  Neurologic: CN 2-12 grossly  intact with no focal deficits.  Romberg sign cerebellar reflexes not assessed.  Psychiatric: Normal judgment and insight. Alert and oriented x 3. Normal mood and appropriate affect.   Data Reviewed: I have personally reviewed following labs and imaging studies  CBC: Recent Labs  Lab 09/27/21 1506 09/27/21 1953 09/28/21 0436 09/29/21 0413 09/30/21 0332  WBC 11.8* 10.6* 10.5 11.6* 8.4  NEUTROABS 10.4*  --  9.3* 10.5* 7.9*  HGB 10.4* 9.4* 9.5* 8.9* 9.4*  HCT 32.0* 29.1* 29.6* 27.8* 29.5*  MCV 98.8 99.0 101.0* 99.6 100.0  PLT 242 200 187 160 573   Basic Metabolic Panel: Recent Labs  Lab 09/27/21 1506 09/27/21 1953 09/28/21 0436 09/29/21 0413 09/30/21 0332  NA 134*  --  134* 134* 139  K 4.4  --  4.1 3.9 4.2  CL 100  --  101 102 104  CO2 27  --  27 27 28   GLUCOSE 111*  --  102* 111* 167*  BUN 22  --  20 16 15   CREATININE 1.11 1.12 0.88 0.90 0.75  CALCIUM 8.7*  --  8.2* 8.0* 8.6*  MG  --   --   --  2.0 2.2  PHOS  --   --   --  2.6 2.6   GFR: Estimated Creatinine Clearance: 76.8 mL/min (by C-G formula based on SCr of 0.75 mg/dL). Liver Function Tests: Recent Labs  Lab 09/27/21 1506 09/29/21 0413 09/30/21 0332  AST 41 22 25  ALT 39 22 26  ALKPHOS 72 62 77  BILITOT 0.9 0.8 0.4  PROT 6.4* 5.4* 5.7*  ALBUMIN 3.2* 2.5* 2.6*   No results for input(s): "LIPASE", "AMYLASE" in the last 168 hours. No results for input(s): "AMMONIA" in the last 168 hours. Coagulation Profile: Recent Labs  Lab 09/27/21 1506  INR 1.3*   Cardiac Enzymes: No results for input(s): "CKTOTAL", "CKMB", "CKMBINDEX", "TROPONINI" in the last 168 hours. BNP (last 3 results) No results for input(s): "PROBNP" in the last 8760 hours. HbA1C: No results for input(s): "HGBA1C" in the last 72 hours. CBG: No results for input(s): "GLUCAP" in the last 168 hours. Lipid Profile: No results for input(s): "CHOL", "HDL", "LDLCALC", "TRIG", "CHOLHDL", "LDLDIRECT" in the last 72 hours. Thyroid Function  Tests: No results for input(s): "TSH", "T4TOTAL", "FREET4", "T3FREE", "THYROIDAB" in the last 72 hours. Anemia Panel: No results for input(s): "VITAMINB12", "FOLATE", "FERRITIN", "TIBC", "IRON", "RETICCTPCT" in the last 72 hours. Sepsis Labs: Recent Labs  Lab 09/27/21 1506 09/27/21 1953  PROCALCITON  --  0.36  LATICACIDVEN 1.3  --     Recent Results (from the past 240 hour(s))  Blood Culture (routine x 2)     Status: None (Preliminary result)   Collection Time: 09/27/21  4:40 PM   Specimen: BLOOD  Result Value Ref Range Status   Specimen Description   Final    BLOOD LEFT ANTECUBITAL Performed at [facility], [place] [address], [place], [place] [address]    Special Requests   Final    BOTTLES DRAWN AEROBIC AND ANAEROBIC Blood Culture results may not be optimal due to an inadequate volume of blood received in culture bottles Performed at [place] [address], [place], [place] [address]    Culture   Final    NO GROWTH 4 DAYS Performed at [facility], [place] [address] [place], [place] [address]    Report Status PENDING  Incomplete  Blood Culture (routine x 2)     Status: None (Preliminary result)   Collection Time: 09/27/21  4:45 PM   Specimen: BLOOD  Result Value Ref Range Status   Specimen Description   Final    BLOOD BLOOD LEFT FOREARM Performed at [place] [address], [place], [place] [address]    Special Requests   Final    BOTTLES DRAWN AEROBIC ONLY Blood Culture adequate volume Performed at [place] [address] [place], [place] [address]    Culture   Final    NO GROWTH 4 DAYS Performed at [facility], [place] [address] [place], [place] [address]    Report Status PENDING  Incomplete  Urine Culture     Status: Abnormal   Collection Time: 09/27/21  7:53 PM   Specimen: In/Out Cath Urine  Result Value Ref Range Status   Specimen Description   Final  IN/OUT CATH URINE Performed at [facility], [place] [address] [place], [place] [address]    Special Requests   Final    NONE Performed at [facility], [place] [address] [place], [place] [address]    Culture 40,000 COLONIES/mL PSEUDOMONAS AERUGINOSA (A)  Final   Report Status 09/30/2021 FINAL  Final   Organism ID, Bacteria PSEUDOMONAS AERUGINOSA (A)  Final      Susceptibility   Pseudomonas aeruginosa - MIC*    CEFTAZIDIME 2 SENSITIVE Sensitive     CIPROFLOXACIN <=0.25 SENSITIVE Sensitive     GENTAMICIN <=1 SENSITIVE Sensitive     IMIPENEM <=0.25 SENSITIVE Sensitive     PIP/TAZO <=4 SENSITIVE Sensitive     CEFEPIME 2 SENSITIVE Sensitive     * 40,000 COLONIES/mL PSEUDOMONAS AERUGINOSA  MRSA Next Gen by PCR, Nasal     Status: Abnormal   Collection Time: 09/29/21  9:25 AM   Specimen: Nasal Mucosa; Nasal Swab  Result Value Ref Range Status   MRSA by PCR Next Gen DETECTED (A) NOT DETECTED Final    Comment: CRITICAL RESULT CALLED TO, READ BACK BY AND VERIFIED WITH: SILVA,J. RN ON 09/29/21 @ 1128 BY GOLSONM (NOTE) The GeneXpert MRSA Assay (FDA approved for NASAL specimens only), is one component of a comprehensive MRSA colonization surveillance program. It is not intended to diagnose MRSA infection nor to guide or monitor treatment for MRSA infections. Test performance is not FDA approved in patients less than 39 years old. Performed at [facility], [place] [address], [place], [place] [address]   SARS Coronavirus 2 by RT PCR (hospital order, performed in [facility] hospital lab) *cepheid single result test*     Status: None   Collection Time: 09/29/21  9:01 PM   Specimen: Nasal Swab  Result Value Ref Range Status   SARS Coronavirus 2 by RT PCR NEGATIVE NEGATIVE Final    Comment: (NOTE) SARS-CoV-2 target nucleic acids are NOT DETECTED.  The SARS-CoV-2 RNA is generally detectable in upper and lower respiratory specimens during the  acute phase of infection. The lowest concentration of SARS-CoV-2 viral copies this assay can detect is 250 copies / mL. A negative result does not preclude SARS-CoV-2 infection and should not be used as the sole basis for treatment or other patient management decisions.  A negative result may occur with improper specimen collection / handling, submission of specimen other than nasopharyngeal swab, presence of viral mutation(s) within the areas targeted by this assay, and inadequate number of viral copies (<250 copies / mL). A negative result must be combined with clinical observations, patient history, and epidemiological information.  Fact Sheet for Patients:   https://www.patel.info/  Fact Sheet for Healthcare Providers: https://hall.com/  This test is not yet approved or  cleared by the [place] FDA and has been authorized for detection and/or diagnosis of SARS-CoV-2 by FDA under an Emergency Use Authorization (EUA).  This EUA will remain in effect (meaning this test can be used) for the duration of the COVID-19 declaration under Section 564(b)(1) of the Act, 21 U.S.C. section 360bbb-3(b)(1), unless the authorization is terminated or revoked sooner.  Performed at [facility], [place] [address], [place], [place] [address]      Radiology Studies: DG CHEST PORT 1 VIEW  Result Date: 10/01/2021 CLINICAL DATA:  COPD and small-cell lung cancer. EXAM: PORTABLE CHEST 1 VIEW COMPARISON:  09/30/2021 FINDINGS: Similar volume loss right hemithorax with irregular circumferential right-sided pleural thickening given differential positioning between  the 2 studies. Left lung remains hyperexpanded but relatively clear. The cardio pericardial silhouette is enlarged. Left Port-A-Cath again noted. Telemetry leads overlie the chest. IMPRESSION: No substantial change in exam given differential positioning. Electronically Signed   By: Misty Stanley M.D.   On: 10/01/2021 10:51   DG CHEST PORT 1 VIEW  Result Date: 09/30/2021 CLINICAL DATA:  Pneumonia EXAM: PORTABLE CHEST 1 VIEW COMPARISON:  Radiograph 09/29/2021 FINDINGS: Persistent near complete right lung opacification with minimal improvement in upper lung aeration. Left lung is clear. Unchanged chest port catheter. Unchanged cardiomediastinal silhouette. No pneumothorax. Bones are unchanged. IMPRESSION: Persistent near complete right lung opacification with minimal improvement in upper lung aeration. Electronically Signed   By: Maurine Simmering M.D.   On: 09/30/2021 08:19    Scheduled Meds:  aspirin EC  81 mg Oral Daily   azithromycin  500 mg Oral Daily   Chlorhexidine Gluconate Cloth  6 each Topical Daily   docusate sodium  200 mg Oral Daily   enoxaparin (LOVENOX) injection  40 mg Subcutaneous Q24H   fluticasone furoate-vilanterol  1 puff Inhalation Daily   guaiFENesin  1,200 mg Oral BID   ipratropium  0.5 mg Nebulization TID   levalbuterol  0.63 mg Nebulization TID   methylPREDNISolone (SOLU-MEDROL) injection  40 mg Intravenous Q12H   mupirocin ointment  1 Application Nasal BID   rosuvastatin  20 mg Oral Daily   sodium chloride HYPERTONIC  4 mL Nebulization BID   tamsulosin  0.4 mg Oral QPM   vitamin B-12  500 mcg Oral QPM   Continuous Infusions:  sodium chloride     cefTRIAXone (ROCEPHIN)  IV 2 g (10/01/21 0921)    LOS: 4 days   Raiford Noble, DO [facility] Available via Epic secure chat 7am-7pm After these hours, please refer to coverage provider listed on [email or web address] 10/01/2021, 4:39 PM

## 2021-10-02 ENCOUNTER — Inpatient Hospital Stay: Payer: Medicare Other | Attending: Internal Medicine

## 2021-10-02 ENCOUNTER — Inpatient Hospital Stay (HOSPITAL_COMMUNITY): Payer: Medicare Other

## 2021-10-02 ENCOUNTER — Telehealth: Payer: Self-pay | Admitting: Student

## 2021-10-02 DIAGNOSIS — J189 Pneumonia, unspecified organism: Secondary | ICD-10-CM | POA: Diagnosis not present

## 2021-10-02 DIAGNOSIS — C3431 Malignant neoplasm of lower lobe, right bronchus or lung: Secondary | ICD-10-CM | POA: Insufficient documentation

## 2021-10-02 DIAGNOSIS — E860 Dehydration: Secondary | ICD-10-CM | POA: Insufficient documentation

## 2021-10-02 DIAGNOSIS — C3411 Malignant neoplasm of upper lobe, right bronchus or lung: Secondary | ICD-10-CM | POA: Insufficient documentation

## 2021-10-02 LAB — CULTURE, BLOOD (ROUTINE X 2)
Culture: NO GROWTH
Culture: NO GROWTH
Special Requests: ADEQUATE

## 2021-10-02 LAB — COMPREHENSIVE METABOLIC PANEL
ALT: 65 U/L — ABNORMAL HIGH (ref 0–44)
AST: 50 U/L — ABNORMAL HIGH (ref 15–41)
Albumin: 2.5 g/dL — ABNORMAL LOW (ref 3.5–5.0)
Alkaline Phosphatase: 80 U/L (ref 38–126)
Anion gap: 5 (ref 5–15)
BUN: 27 mg/dL — ABNORMAL HIGH (ref 8–23)
CO2: 33 mmol/L — ABNORMAL HIGH (ref 22–32)
Calcium: 8.6 mg/dL — ABNORMAL LOW (ref 8.9–10.3)
Chloride: 103 mmol/L (ref 98–111)
Creatinine, Ser: 0.78 mg/dL (ref 0.61–1.24)
GFR, Estimated: >60 mL/min (ref >60)
Glucose, Bld: 124 mg/dL — ABNORMAL HIGH (ref 70–99)
Potassium: 4.3 mmol/L (ref 3.5–5.1)
Sodium: 141 mmol/L (ref 135–145)
Total Bilirubin: 0.3 mg/dL (ref 0.3–1.2)
Total Protein: 5.6 g/dL — ABNORMAL LOW (ref 6.5–8.1)

## 2021-10-02 LAB — CBC WITH DIFFERENTIAL/PLATELET
Abs Immature Granulocytes: 0.06 10*3/uL (ref 0.00–0.07)
Basophils Absolute: 0 10*3/uL (ref 0.0–0.1)
Basophils Relative: 0 %
Eosinophils Absolute: 0 10*3/uL (ref 0.0–0.5)
Eosinophils Relative: 0 %
HCT: 28.9 % — ABNORMAL LOW (ref 39.0–52.0)
Hemoglobin: 9.2 g/dL — ABNORMAL LOW (ref 13.0–17.0)
Immature Granulocytes: 1 %
Lymphocytes Relative: 4 %
Lymphs Abs: 0.3 10*3/uL — ABNORMAL LOW (ref 0.7–4.0)
MCH: 31.7 pg (ref 26.0–34.0)
MCHC: 31.8 g/dL (ref 30.0–36.0)
MCV: 99.7 fL (ref 80.0–100.0)
Monocytes Absolute: 0.2 10*3/uL (ref 0.1–1.0)
Monocytes Relative: 3 %
Neutro Abs: 7.4 10*3/uL (ref 1.7–7.7)
Neutrophils Relative %: 92 %
Platelets: 168 10*3/uL (ref 150–400)
RBC: 2.9 MIL/uL — ABNORMAL LOW (ref 4.22–5.81)
RDW: 15.7 % — ABNORMAL HIGH (ref 11.5–15.5)
WBC: 8 10*3/uL (ref 4.0–10.5)
nRBC: 0 % (ref 0.0–0.2)

## 2021-10-02 LAB — MAGNESIUM: Magnesium: 2.3 mg/dL (ref 1.7–2.4)

## 2021-10-02 LAB — PHOSPHORUS: Phosphorus: 3.4 mg/dL (ref 2.5–4.6)

## 2021-10-02 NOTE — Telephone Encounter (Signed)
Pt scheduled to see Dr Valeta Harms on 8/10 @ 21. Dr. Valeta Harms, please advise if you would like this appt to be longer than 15 minutes. He has not been in office since 2021. Thanks!

## 2021-10-02 NOTE — Care Management Important Message (Signed)
Important Message  Patient Details IM Letter given to the Patient. Name: Gerald [name] MRN: [id number] Date of Birth: 06/08/1940   Medicare Important Message Given:  Yes     Kerin Salen 10/02/2021, 11:36 AM

## 2021-10-02 NOTE — Telephone Encounter (Signed)
Would Dr. Valeta Harms be able to see him for hospital follow up in 4-6 weeks? Needs chest x ray on day of visit. 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   Thanks!

## 2021-10-02 NOTE — Progress Notes (Signed)
   NAME:  Gerald [name], MRN:  [id number], DOB:  1941-02-26, LOS: 5 ADMISSION DATE:  09/27/2021, CONSULTATION DATE:  09/30/21 REFERRING MD:  Alfredia Ferguson, CHIEF COMPLAINT:  weakness   History of Present Illness:  81yM with history of   Pertinent  Medical History  COPD, small cell lung cancer, hypertension, hyperlipidemia, urinary retention, coronary artery disease, obesity present here with complaining of poor appetite and generalized weakness that this started night PTA.   Per pt's wife typically gets pneumonia whenever he gets chemotherapy (which was Tue PTA). Then had anorexia, weakness, fever through the day of admission. Here he was started on ceftriaxone and azithromycin and he has defervesced since 6/30. We were consulted for abnormal chest x-ray today.  Significant Hospital Events: Including procedures, antibiotic start and stop dates in addition to other pertinent events   6/30-7/3 admitted started on CAP coverage and pulmonary toilet   Interim History / Subjective:   Looks like we're essentially back to his baseline atelectasis compared with CXR 5/2. Feeling a bit better.   Objective   Blood pressure 118/65, pulse 87, temperature 97.6 F (36.4 C), temperature source Oral, resp. rate 18, height 6' (1.829 m), weight 90.7 kg, SpO2 100 %.        Intake/Output Summary (Last 24 hours) at 10/02/2021 1442 Last data filed at 10/02/2021 1232 Gross per 24 hour  Intake 957 ml  Output 700 ml  Net 257 ml   Filed Weights   09/27/21 1458  Weight: 90.7 kg    Examination: General appearance: 81 y.o., male, NAD, conversant  Eyes: PERRL, tracking appropriately HENT: NCAT; MMM Neck: Trachea midline; no lymphadenopathy, no JVD Lungs: rhonchorous but diminished on R though improved, with normal respiratory effort CV: RRR, no murmur  Abdomen: Soft, non-tender; non-distended, BS present  Extremities: No peripheral edema, warm Skin: Normal turgor and texture; no rash Psych: Appropriate  affect Neuro: Alert and oriented to person and place, no focal deficit    Resolved Hospital Problem list     Assessment & Plan:   # RLL, RML, RUL atelectasis # CAP, post obstructive pneumonia - continue CTX while here, would discharge with augmentin to complete 2 week course of ABX for postobstructive pneumonia - bronchodilators, steroids, resume trelegy at discharge - taper steroids off over 5 days - HTS BID while here - vest q4 while here - left lung down for postural drainage - at discharge encourage flutter valve 10 slow but firm puffs after trelegy in the morning and then he is to do albuterol 2 puffs followed by flutter valve 10 slow but firm puffs before dinner - walking oximetry before discharge - will set up follow up appointment with Dr. Valeta Harms  Will sign off but glad to be reinvolved as condition changes  Best Practice (right click and "Reselect all SmartList Selections" daily)   Per [facility]  Critical care time: n/a

## 2021-10-02 NOTE — Progress Notes (Signed)
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  He was found to have pneumonia and was admitted to the hospital.  Due to lack of improvement over the first few days, pulmonary consulted  Subjective / 24h Interval events: Feels better than on admission but still with significant dyspnea with activities  Assesement and Plan: Principal Problem:   Pneumonia Active Problems:   Small cell lung cancer ([place])   HTN (hypertension)   HLD (hyperlipidemia)   Pressure injury of skin  Principal problem Sepsis secondary to right lower lobe pneumonia, acute on chronic hypoxic respiratory failure-he has known surgeries of oxygen at home, currently on 2 L but had significant dyspnea with ablation.  Continue steroids, nebulizers, inhalers.  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  Seeing Dr. Julien Nordmann  BPH-continue tamsulosin  Hyperlipidemia-continue statin  Normocytic anemia-due to underlying malignancy, no bleeding  Hyponatremia-resolved  COPD-no wheezing  Bacteriuria-urine cultures with 40,000 CFU's of Pseudomonas.  Asymptomatic  Scheduled Meds:  aspirin EC  81 mg Oral Daily   Chlorhexidine Gluconate Cloth  6 each Topical Daily   docusate sodium  200 mg Oral Daily   enoxaparin (LOVENOX) injection  40 mg Subcutaneous Q24H   fluticasone furoate-vilanterol  1 puff Inhalation Daily   guaiFENesin  1,200 mg Oral BID   ipratropium  0.5 mg Nebulization TID   levalbuterol  0.63 mg Nebulization TID   methylPREDNISolone (SOLU-MEDROL) injection  40 mg Intravenous Q12H   mupirocin ointment  1 Application Nasal  BID   rosuvastatin  20 mg Oral Daily   sodium chloride HYPERTONIC  4 mL Nebulization BID   tamsulosin  0.4 mg Oral QPM   vitamin B-12  500 mcg Oral QPM   Continuous Infusions:  sodium chloride     PRN Meds:.sodium chloride, acetaminophen, levalbuterol, polyethylene glycol, sodium chloride flush  Diet Orders (From admission, onward)     Start     Ordered   09/27/21 1758  Diet regular Room service appropriate? Yes; Fluid consistency: Thin  Diet effective now       Question Answer Comment  Room service appropriate? Yes   Fluid consistency: Thin      09/27/21 1758           DVT prophylaxis: enoxaparin (LOVENOX) injection 40 mg Start: 09/27/21 2200 SCDs Start: 09/27/21 1757   Lab Results  Component Value Date   PLT 168 10/02/2021      Code Status: Full Code  Family Communication: no family at bedside  Status is: Inpatient  Remains inpatient appropriate because: Persistent symptoms  Level of care: Progressive  Consultants:  Pulmonary    Objective: Vitals:   10/01/21 2038 10/01/21 2100 10/02/21 0543 10/02/21 0920  BP: (!) 110/59  125/73   Pulse: 78  78   Resp: 20  16   Temp: 97.7 F (36.5 C)  97.9 F (36.6 C)   TempSrc: Oral  Oral   SpO2: 100% 99% 98% 97%  Weight:      Height:        Intake/Output Summary (Last 24 hours) at 10/02/2021 1146 Last data filed at 10/02/2021 0910 Gross per 24 hour  Intake 837 ml  Output 700 ml  Net 137 ml   Wt Readings from Last 3 Encounters:  09/27/21 90.7 kg  09/24/21 89.9 kg  09/16/21 90.3 kg    Examination:  Constitutional: NAD Eyes: no scleral icterus ENMT: Mucous membranes are moist.  Neck: normal, supple Respiratory: Bibasilar rhonchi, no wheezing Cardiovascular: Regular rate and rhythm, no murmurs / rubs / gallops. No LE edema. Good peripheral pulses Abdomen: non distended, no tenderness. Bowel sounds positive.  Musculoskeletal: no clubbing / cyanosis.  Skin: no rashes Neurologic: Nonfocal   Data  Reviewed: I have independently reviewed following labs and imaging studies  CBC Recent Labs  Lab 09/27/21 1506 09/27/21 1953 09/28/21 0436 09/29/21 0413 09/30/21 0332 10/02/21 0437  WBC 11.8* 10.6* 10.5 11.6* 8.4 8.0  HGB 10.4* 9.4* 9.5* 8.9* 9.4* 9.2*  HCT 32.0* 29.1* 29.6* 27.8* 29.5* 28.9*  PLT 242 200 187 160 157 168  MCV 98.8 99.0 101.0* 99.6 100.0 99.7  MCH 32.1 32.0 32.4 31.9 31.9 31.7  MCHC 32.5 32.3 32.1 32.0 31.9 31.8  RDW 15.9* 15.9* 16.0* 15.9* 15.6* 15.7*  LYMPHSABS 0.8  --  0.7 0.6* 0.3* 0.3*  MONOABS 0.6  --  0.4 0.4 0.1 0.2  EOSABS 0.0  --  0.0 0.0 0.0 0.0  BASOSABS 0.0  --  0.1 0.0 0.0 0.0    Recent Labs  Lab 09/27/21 1506 09/27/21 1953 09/28/21 0436 09/29/21 0413 09/30/21 0332 10/02/21 0437  NA 134*  --  134* 134* 139 141  K 4.4  --  4.1 3.9 4.2 4.3  CL 100  --  101 102 104 103  CO2 27  --  27 27 28  33*  GLUCOSE 111*  --  102* 111* 167* 124*  BUN 22  --  20 16 15  27*  CREATININE 1.11 1.12 0.88 0.90 0.75 0.78  CALCIUM 8.7*  --  8.2* 8.0* 8.6* 8.6*  AST 41  --   --  22 25 50*  ALT 39  --   --  22 26 65*  ALKPHOS 72  --   --  62 77 80  BILITOT 0.9  --   --  0.8 0.4 0.3  ALBUMIN 3.2*  --   --  2.5* 2.6* 2.5*  MG  --   --   --  2.0 2.2 2.3  PROCALCITON  --  0.36  --   --   --   --   LATICACIDVEN 1.3  --   --   --   --   --   INR 1.3*  --   --   --   --   --     ------------------------------------------------------------------------------------------------------------------ No results for input(s): "CHOL", "HDL", "LDLCALC", "TRIG", "CHOLHDL", "LDLDIRECT" in the last 72 hours.  No results found for: "HGBA1C" ------------------------------------------------------------------------------------------------------------------ No results for input(s): "TSH", "T4TOTAL", "T3FREE", "THYROIDAB" in the last 72 hours.  Invalid input(s): "FREET3"  Cardiac Enzymes No results for input(s): "CKMB", "TROPONINI", "MYOGLOBIN" in the last 168 hours.  Invalid  input(s): "CK" ------------------------------------------------------------------------------------------------------------------    Component Value Date/Time   BNP 443.5 (H) 07/30/2021 2150    CBG: No results for input(s): "GLUCAP" in the last 168 hours.  Recent Results (from the past 240 hour(s))  Blood Culture (routine x 2)     Status: None (Preliminary result)   Collection Time: 09/27/21  4:40 PM   Specimen: BLOOD  Result Value Ref Range Status   Specimen Description   Final    BLOOD LEFT ANTECUBITAL Performed at [facility],  [place] [address], [place], [place] [address]    Special Requests   Final    BOTTLES DRAWN AEROBIC AND ANAEROBIC Blood Culture results may not be optimal due to an inadequate volume of blood received in culture bottles Performed at [place] [address] [place], [place] [address]    Culture   Final    NO GROWTH 4 DAYS Performed at [facility], [place] [address] [place], [place] [address]    Report Status PENDING  Incomplete  Blood Culture (routine x 2)     Status: None (Preliminary result)   Collection Time: 09/27/21  4:45 PM   Specimen: BLOOD  Result Value Ref Range Status   Specimen Description   Final    BLOOD BLOOD LEFT FOREARM Performed at [place] [address], [place], [place] [address]    Special Requests   Final    BOTTLES DRAWN AEROBIC ONLY Blood Culture adequate volume Performed at [place] [address] [place], [place] [address]    Culture   Final    NO GROWTH 4 DAYS Performed at [facility], [place] [address], [place], [place] [address]    Report Status PENDING  Incomplete  Urine Culture     Status: Abnormal   Collection Time: 09/27/21  7:53 PM   Specimen: In/Out Cath Urine  Result Value Ref Range Status   Specimen Description   Final    IN/OUT CATH URINE Performed at [place] [address]  [place], [place] [address]    Special Requests   Final    NONE Performed at [facility], [place] [address], [place], [place] [address]    Culture 40,000 COLONIES/mL PSEUDOMONAS AERUGINOSA (A)  Final   Report Status 09/30/2021 FINAL  Final   Organism ID, Bacteria PSEUDOMONAS AERUGINOSA (A)  Final      Susceptibility   Pseudomonas aeruginosa - MIC*    CEFTAZIDIME 2 SENSITIVE Sensitive     CIPROFLOXACIN <=0.25 SENSITIVE Sensitive     GENTAMICIN <=1 SENSITIVE Sensitive     IMIPENEM <=0.25 SENSITIVE Sensitive     PIP/TAZO <=4 SENSITIVE Sensitive     CEFEPIME 2 SENSITIVE Sensitive     * 40,000 COLONIES/mL PSEUDOMONAS AERUGINOSA  MRSA Next Gen by PCR, Nasal     Status: Abnormal   Collection Time: 09/29/21  9:25 AM   Specimen: Nasal Mucosa; Nasal Swab  Result Value Ref Range Status   MRSA by PCR Next Gen DETECTED (A) NOT DETECTED Final    Comment: CRITICAL RESULT CALLED TO, READ BACK BY AND VERIFIED WITH: SILVA,J. RN ON 09/29/21 @ 1128 BY GOLSONM (NOTE) The GeneXpert MRSA Assay (FDA approved for NASAL specimens only), is one component of a comprehensive MRSA colonization surveillance program. It is not intended to diagnose MRSA infection nor to guide or monitor treatment for MRSA infections. Test performance is not FDA approved in patients less than 34 years old. Performed at [facility], [place] [address] [place],  [address]   SARS Coronavirus 2 by RT PCR (hospital order, performed in [facility] hospital lab) *cepheid single result test*     Status: None   Collection Time: 09/29/21  9:01 PM   Specimen: Nasal Swab  Result Value Ref Range Status   SARS Coronavirus 2 by RT PCR NEGATIVE NEGATIVE Final    Comment: (NOTE) SARS-CoV-2 target nucleic acids are NOT DETECTED.  The SARS-CoV-2 RNA is generally detectable  in upper and lower respiratory specimens during the acute phase of infection. The lowest concentration of SARS-CoV-2 viral copies this  assay can detect is 250 copies / mL. A negative result does not preclude SARS-CoV-2 infection and should not be used as the sole basis for treatment or other patient management decisions.  A negative result may occur with improper specimen collection / handling, submission of specimen other than nasopharyngeal swab, presence of viral mutation(s) within the areas targeted by this assay, and inadequate number of viral copies (<250 copies / mL). A negative result must be combined with clinical observations, patient history, and epidemiological information.  Fact Sheet for Patients:   https://www.patel.info/  Fact Sheet for Healthcare Providers: https://hall.com/  This test is not yet approved or  cleared by the [place] FDA and has been authorized for detection and/or diagnosis of SARS-CoV-2 by FDA under an Emergency Use Authorization (EUA).  This EUA will remain in effect (meaning this test can be used) for the duration of the COVID-19 declaration under Section 564(b)(1) of the Act, 21 U.S.C. section 360bbb-3(b)(1), unless the authorization is terminated or revoked sooner.  Performed at [facility], [place] [address], [place], [place] [address]      Radiology Studies: DG CHEST PORT 1 VIEW  Result Date: 10/02/2021 CLINICAL DATA:  History [id number] shortness of breath EXAM: PORTABLE CHEST 1 VIEW COMPARISON:  CT dated September 19, 2021, radiograph dated July second-4, 2023 FINDINGS: The cardiomediastinal silhouette is unchanged in contour.LEFT chest port with tip terminating over the superior cavoatrial junction. Revisualization of RIGHT-sided volume loss with a moderate RIGHT pleural effusion/pleural thickening. Overall aeration of the RIGHT lung is improved since July 2nd. Similar comparison to prior. Similar appearance of interstitial markings throughout the RIGHT lung. No pneumothorax. No acute pleuroparenchymal abnormality is  visualized in the LEFT lung. Visualized abdomen is unremarkable. IMPRESSION: Overall improved aeration of the RIGHT lung in comparison to September 29, 2021. It is relatively unchanged in comparison to prior radiograph. Electronically Signed   By: Valentino Saxon M.D.   On: 10/02/2021 07:59     Marzetta Board, MD, PhD [facility]  Between 7 am - 7 pm I am available, please contact me via Amion (for emergencies) or Securechat (non urgent messages)  Between 7 pm - 7 am I am not available, please contact night coverage MD/APP via Amion

## 2021-10-02 NOTE — Progress Notes (Signed)
PT CPT given at 0800

## 2021-10-02 NOTE — Progress Notes (Signed)
Pt wants to do Flutter Valve instead of Chest Vest tonight.

## 2021-10-02 NOTE — TOC Progression Note (Signed)
Transition of Care [facility]) - Progression Note    Patient Details  Name: Gerald [name] MRN: [id number] Date of Birth: 12-01-1940  [facility]) CM/SW Contact  Leeroy Cha, RN Phone Number: 10/02/2021, 8:56 AM  Clinical Narrative:    [phone] chart reviewed.  Following for toc needs.  Plan is to return home with self-care at this time.   Expected Discharge Plan: Home/Self Care Barriers to Discharge: Continued Medical Work up  Expected Discharge Plan and Services Expected Discharge Plan: Home/Self Care   Discharge Planning Services: CM Consult   Living arrangements for the past 2 months: Single Family Home                                       Social Determinants of Health (SDOH) Interventions    Readmission Risk Interventions    07/31/2021   12:49 PM  Readmission Risk Prevention Plan  Transportation Screening Complete  PCP or Specialist Appt within 5-7 Days Complete  Home Care Screening Complete  Medication Review (RN CM) Complete

## 2021-10-02 NOTE — Progress Notes (Signed)
Physical Therapy Treatment Patient Details Name: Gerald [name] MRN: [id number] DOB: [birth year] Today's Date: 10/02/2021   History of Present Illness 81 yo male admitted with Pna, weakness, sepsis. Hx of COPD, lung ca, CAD    PT Comments    Pt is mobilizing well, he ambulated 130' with RW, SpO2 93% on 3L O2, no loss of balance, no dyspnea. He is mobilizing well enough to DC home from a PT standpoint.    Recommendations for follow up therapy are one component of a multi-disciplinary discharge planning process, led by the attending physician.  Recommendations may be updated based on patient status, additional functional criteria and insurance authorization.  Follow Up Recommendations  No PT follow up     Assistance Recommended at Discharge PRN  Patient can return home with the following A little help with bathing/dressing/bathroom;Assist for transportation;Assistance with cooking/housework   Equipment Recommendations  None recommended by PT    Recommendations for Other Services       Precautions / Restrictions Precautions Precautions: Fall Precaution Comments: monitor O2 Restrictions Weight Bearing Restrictions: No     Mobility  Bed Mobility               General bed mobility comments: up in recliner    Transfers Overall transfer level: Modified independent Equipment used: Rolling walker (2 wheels) Transfers: Sit to/from Stand Sit to Stand: Modified independent (Device/Increase time)           General transfer comment: good safety awareness    Ambulation/Gait Ambulation/Gait assistance: Modified independent (Device/Increase time) Gait Distance (Feet): 130 Feet Assistive device: Rolling walker (2 wheels) Gait Pattern/deviations: Step-through pattern, Decreased stride length, Trunk flexed Gait velocity: WFL     General Gait Details: SpO2 93% on 3L O2 with walking, no dypsnea, no loss of balance; assist just for IV pole and O2 tank   Stairs              Wheelchair Mobility    Modified Rankin (Stroke Patients Only)       Balance Overall balance assessment: Needs assistance         Standing balance support: Bilateral upper extremity supported, During functional activity, Reliant on assistive device for balance Standing balance-Leahy Scale: Fair                              Cognition Arousal/Alertness: Awake/alert Behavior During Therapy: WFL for tasks assessed/performed Overall Cognitive Status: Within Functional Limits for tasks assessed                                          Exercises      General Comments        Pertinent Vitals/Pain Pain Assessment Pain Score: 0-No pain    Home Living                          Prior Function            PT Goals (current goals can now be found in the care plan section) Acute Rehab PT Goals Patient Stated Goal: breathing better PT Goal Formulation: With patient Time For Goal Achievement: 10/13/21 Potential to Achieve Goals: Good Progress towards PT goals: Progressing toward goals    Frequency    Min 3X/week      PT Plan Current plan remains  appropriate    Co-evaluation              AM-PAC PT "6 Clicks" Mobility   Outcome Measure  Help needed turning from your back to your side while in a flat bed without using bedrails?: A Little Help needed moving from lying on your back to sitting on the side of a flat bed without using bedrails?: A Little Help needed moving to and from a bed to a chair (including a wheelchair)?: None Help needed standing up from a chair using your arms (e.g., wheelchair or bedside chair)?: None Help needed to walk in hospital room?: None Help needed climbing 3-5 steps with a railing? : A Little 6 Click Score: 21    End of Session Equipment Utilized During Treatment: Oxygen Activity Tolerance: Patient tolerated treatment well Patient left: in chair;with call bell/phone within reach    PT Visit Diagnosis: Difficulty in walking, not elsewhere classified (R26.2)     Time: 1047-1101 PT Time Calculation (min) (ACUTE ONLY): 14 min  Charges:  $Gait Training: 8-22 mins                     Blondell Reveal Kistler PT 10/02/2021  [place]  Office [phone]

## 2021-10-03 ENCOUNTER — Telehealth: Payer: Self-pay | Admitting: Physician Assistant

## 2021-10-03 DIAGNOSIS — J189 Pneumonia, unspecified organism: Secondary | ICD-10-CM | POA: Diagnosis not present

## 2021-10-03 MED ORDER — HEPARIN SOD (PORK) LOCK FLUSH 100 UNIT/ML IV SOLN
500.0000 [IU] | INTRAVENOUS | Status: AC | PRN
  Administered 2021-10-03: 500 [IU]

## 2021-10-03 MED ORDER — GUAIFENESIN ER 600 MG PO TB12: 1200.0000 mg | ORAL_TABLET | Freq: Two times a day (BID) | ORAL | 0 refills | Status: AC

## 2021-10-03 MED ORDER — AMOXICILLIN-POT CLAVULANATE 875-125 MG PO TABS
1.0000 | ORAL_TABLET | Freq: Two times a day (BID) | ORAL | Status: DC
  Administered 2021-10-03: 1 via ORAL
  Filled 2021-10-03: qty 1

## 2021-10-03 MED ORDER — PREDNISONE 10 MG PO TABS: ORAL_TABLET | ORAL | 0 refills | Status: AC

## 2021-10-03 MED ORDER — AMOXICILLIN-POT CLAVULANATE 875-125 MG PO TABS: 1.0000 | ORAL_TABLET | Freq: Two times a day (BID) | ORAL | 0 refills | Status: AC

## 2021-10-03 NOTE — Discharge Summary (Signed)
Physician Discharge Summary  Gerald [name] ATF:[id number] DOB: [birth year] DOA: 09/27/2021  PCP: [name], DO  Admit date: 09/27/2021 Discharge date: 10/03/2021  Admitted From: home Disposition:  home  Recommendations for Outpatient Follow-up:  Follow up with PCP in 1-2 weeks Follow up with Dr Valeta Harms as scheduled Follow up with oncology as scheduled  Home Health: PT Equipment/Devices: none  Discharge Condition: stable CODE STATUS: Full code Diet Orders (From admission, onward)     Start     Ordered   09/27/21 1758  Diet regular Room service appropriate? Yes; Fluid consistency: Thin  Diet effective now       Question Answer Comment  Room service appropriate? Yes   Fluid consistency: Thin      09/27/21 1758            HPI: Per admitting MD, Gerald [name] is a 81 y.o. male with medical history significant of COPD, small cell lung cancer, hypertension, hyperlipidemia, urinary retention, coronary artery disease, obesity present here with complaining of poor appetite and generalized weakness that this started last night. As per patient's wife at the bedside: Patient always gets pneumonia whenever he gets the chemotherapy.  Last chemo was done on Tuesday.  He started having poor appetite and weak, no energy, chills and fever all 101 today therefore wife brought him to the ER for further evaluation and management. He has chronic cough and sputum production.  He is on 2 L of oxygen via nasal cannulae at home at baseline.  He denies headache, blurry vision, chest pain, wheezing, nausea, vomiting, diarrhea, abdominal pain, recent sick contact.  Hospital Course / Discharge diagnoses: Principal Problem:   Pneumonia Active Problems:   Small cell lung cancer (HCC)   HTN (hypertension)   HLD (hyperlipidemia)   Pressure injury of skin   Principal problem Sepsis secondary to right lower lobe pneumonia, acute on chronic hypoxic respiratory failure-patient was admitted to the hospital  with right lower lobe pneumonia and worsening hypoxic respiratory failure, he is on 2 L at home.  He was placed on broad-spectrum antibiotics as well as steroids, but given lack of improvement pulmonary was consulted.  A bronchoscopy was considered, but eventually improved and this was deferred to an outpatient setting.  With treatment he improved, close to baseline, able to ambulate in the hallway, pulmonary signed off and will be discharged home in stable condition.  He will be placed on Augmentin for 2 additional weeks for postobstructive pneumonia.  He will also be given a steroid taper  Active problems Small cell lung cancer, mets to liver-undergoing chemotherapy as an outpatient, last chemo was 6/27.  Seeing Dr. Julien Nordmann BPH-continue tamsulosin Hyperlipidemia-continue statin Normocytic anemia-due to underlying malignancy, no bleeding Hyponatremia-resolved COPD-no wheezing Bacteriuria-urine cultures with 40,000 CFU's of Pseudomonas. Asymptomatic  Discharge Instructions   Allergies as of 10/03/2021       Reactions   Cosela [trilaciclib] Other (See Comments)   thrombophlebitis        Medication List     TAKE these medications    albuterol 108 (90 Base) MCG/ACT inhaler Commonly known as: VENTOLIN HFA Inhale 2 puffs into the lungs every 6 (six) hours as needed for wheezing or shortness of breath.   AMBULATORY NON FORMULARY MEDICATION Please provide 4 wheel rolling walker with seat for [employer].  Diagnosis: R26.81 Gait instability   amoxicillin-clavulanate 875-125 MG tablet Commonly known as: AUGMENTIN Take 1 tablet by mouth every 12 (twelve) hours for 14 days.   aspirin EC 81 MG  tablet Take 1 tablet (81 mg total) by mouth daily. Swallow whole.   clobetasol cream 0.05 % Commonly known as: TEMOVATE Apply 1 application topically daily as needed (Psoriasis).   docusate sodium 100 MG capsule Commonly known as: COLACE Take 200 mg by mouth daily.   guaiFENesin 600 MG 12  hr tablet Commonly known as: MUCINEX Take 2 tablets (1,200 mg total) by mouth 2 (two) times daily for 14 days.   hydrocortisone 1 % ointment Apply 1 application. topically 2 (two) times daily. To the rash. Please follow-up with your dermatologist.   lidocaine-prilocaine cream Commonly known as: EMLA Apply to the Port-A-Cath site 30-60-minute before chemotherapy.   MiraLax 17 g packet Generic drug: polyethylene glycol Take 17 g by mouth daily as needed for moderate constipation.   niacinamide 500 MG tablet Take 500 mg by mouth daily with breakfast.   predniSONE 10 MG tablet Commonly known as: DELTASONE Take 4 tablets (40 mg total) by mouth daily for 2 days, THEN 3 tablets (30 mg total) daily for 2 days, THEN 2 tablets (20 mg total) daily for 2 days, THEN 1 tablet (10 mg total) daily for 2 days. Start taking on: October 03, 2021   PRESERVISION AREDS 2 PO Take 1 tablet by mouth in the morning and at bedtime.   prochlorperazine 10 MG tablet Commonly known as: COMPAZINE Take 1 tablet (10 mg total) by mouth every 6 (six) hours as needed. What changed: reasons to take this   rosuvastatin 20 MG tablet Commonly known as: CRESTOR Take 1 tablet (20 mg total) by mouth daily.   tamsulosin 0.4 MG Caps capsule Commonly known as: FLOMAX Take 0.4 mg by mouth every evening.   Trelegy Ellipta 100-62.5-25 MCG/ACT Aepb Generic drug: Fluticasone-Umeclidin-Vilant Inhale 1 puff into the lungs daily.   triamcinolone cream 0.1 % Commonly known as: KENALOG Apply 1 application topically daily as needed (skin irritation.).   vitamin B-12 500 MCG tablet Commonly known as: CYANOCOBALAMIN Take 500 mcg by mouth every evening.        Consultations: Pulmonary   Procedures/Studies:  DG CHEST PORT 1 VIEW  Result Date: 10/02/2021 CLINICAL DATA:  History [id number] shortness of breath EXAM: PORTABLE CHEST 1 VIEW COMPARISON:  CT dated September 19, 2021, radiograph dated July second-4, 2023 FINDINGS: The  cardiomediastinal silhouette is unchanged in contour.LEFT chest port with tip terminating over the superior cavoatrial junction. Revisualization of RIGHT-sided volume loss with a moderate RIGHT pleural effusion/pleural thickening. Overall aeration of the RIGHT lung is improved since July 2nd. Similar comparison to prior. Similar appearance of interstitial markings throughout the RIGHT lung. No pneumothorax. No acute pleuroparenchymal abnormality is visualized in the LEFT lung. Visualized abdomen is unremarkable. IMPRESSION: Overall improved aeration of the RIGHT lung in comparison to September 29, 2021. It is relatively unchanged in comparison to prior radiograph. Electronically Signed   By: Valentino Saxon M.D.   On: 10/02/2021 07:59   DG CHEST PORT 1 VIEW  Result Date: 10/01/2021 CLINICAL DATA:  COPD and small-cell lung cancer. EXAM: PORTABLE CHEST 1 VIEW COMPARISON:  09/30/2021 FINDINGS: Similar volume loss right hemithorax with irregular circumferential right-sided pleural thickening given differential positioning between the 2 studies. Left lung remains hyperexpanded but relatively clear. The cardio pericardial silhouette is enlarged. Left Port-A-Cath again noted. Telemetry leads overlie the chest. IMPRESSION: No substantial change in exam given differential positioning. Electronically Signed   By: Misty Stanley M.D.   On: 10/01/2021 10:51   DG CHEST PORT 1 VIEW  Result Date:  09/30/2021 CLINICAL DATA:  Pneumonia EXAM: PORTABLE CHEST 1 VIEW COMPARISON:  Radiograph 09/29/2021 FINDINGS: Persistent near complete right lung opacification with minimal improvement in upper lung aeration. Left lung is clear. Unchanged chest port catheter. Unchanged cardiomediastinal silhouette. No pneumothorax. Bones are unchanged. IMPRESSION: Persistent near complete right lung opacification with minimal improvement in upper lung aeration. Electronically Signed   By: Maurine Simmering M.D.   On: 09/30/2021 08:19   DG CHEST PORT 1  VIEW  Result Date: 09/29/2021 CLINICAL DATA:  Pneumonia.  History of lung cancer. EXAM: PORTABLE CHEST 1 VIEW COMPARISON:  Radiographs 09/27/2021 and 07/30/2021.  CT 09/19/2021. FINDINGS: 0508 hours. Left-sided Port-A-Cath appears unchanged at the level of the superior cavoatrial junction. Progressive volume loss and opacity in the right hemithorax which is now nearly completely opacified. The left lung remains clear. No evidence of left pleural effusion or pneumothorax. The right-sided mediastinal contours are obscured by the right hemithorax opacification. Patient has known mediastinal and right hilar adenopathy on recent CT. The bones appear unchanged.  Telemetry leads overlie the chest. IMPRESSION: 1. Progressive volume loss and opacification of the right hemithorax consistent with pneumonia and/or posterior obstructive pneumonitis from the central tumor demonstrated on CT. 2. The left lung remains clear. Electronically Signed   By: Richardean Sale M.D.   On: 09/29/2021 08:42   DG Chest 2 View  Result Date: 09/27/2021 CLINICAL DATA:  Fever.  Lung cancer EXAM: CHEST - 2 VIEW COMPARISON:  07/30/2021, 09/19/2021 FINDINGS: Left-sided chest port stable in positioning. Stable cardiomediastinal contours with prominent right paratracheal and right perihilar nodularity compatible with known adenopathy. Chronic small right pleural effusion. Progressive airspace opacity within the right lung base. Fiduciary markers in the right lung are present. Left lung is clear. No pneumothorax. IMPRESSION: 1. Progressive airspace opacity within the right lung base concerning for infection. 2. Chronic small right pleural effusion. Electronically Signed   By: Davina Poke D.O.   On: 09/27/2021 15:44   CT Chest W Contrast  Result Date: 09/20/2021 CLINICAL DATA:  Primary Cancer Type: Lung Imaging Indication: Assess response to therapy Interval therapy since last imaging? Yes Initial Cancer Diagnosis Date: 09/27/2019;  Established by: Biopsy-proven Detailed Pathology: Stage Ia non-small cell lung cancer, squamous cell carcinoma. Primary Tumor location:  Right upper lobe and right lower lobe. Recurrence? Yes; Date(s) of recurrence: 03/29/2021; Established by: Biopsy-proven Surgeries: Endobronchial cryotherapy 09/27/2019. Umbilical hernia repair. Chemotherapy: Yes; Ongoing? Yes; Most recent administration: 09/04/2021 Immunotherapy?  Yes; Type: Imfinzi; Ongoing? No Radiation therapy? Yes; Date Range: 11/01/2019 - 11/14/2019; Target: Right lung * Tracking Code: BO * EXAM: CT CHEST, ABDOMEN, AND PELVIS WITH CONTRAST TECHNIQUE: Multidetector CT imaging of the chest, abdomen and pelvis was performed following the standard protocol during bolus administration of intravenous contrast. RADIATION DOSE REDUCTION: This exam was performed according to the departmental dose-optimization program which includes automated exposure control, adjustment of the mA and/or kV according to patient size and/or use of iterative reconstruction technique. CONTRAST:  47mL OMNIPAQUE IOHEXOL 300 MG/ML SOLN, additional oral enteric COMPARISON:  Most recent CT chest 07/26/2021. CT chest, abdomen and pelvis 07/04/2021. 03/13/2021 PET-CT. FINDINGS: CT CHEST FINDINGS Cardiovascular: Aortic atherosclerosis. Left chest port catheter. Normal heart size. Three-vessel coronary artery calcifications. No pericardial effusion. Mediastinum/Nodes: Unchanged, bulky right hilar and pretracheal lymph nodes, measuring up to 4.5 x 4.5 cm (series 2, image 21). Additional enlarged anterior mediastinal lymph node or pleural-based soft tissue nodule is unchanged measuring 3.9 x 2.7 cm (series 2, image 23). Heterogeneous nodule of the right  lobe of the thyroid, not clinically significant in the setting of advanced metastatic malignancy. Interval enlargement of right-sided subpectoral lymph nodes, measuring up to 1.4 x 1.1 cm, previously 1.2 x 0.8 cm (series 2, image 17). In the setting  of significant comorbidities or limited life expectancy, no follow-up recommended (ref: J Am Coll Radiol. 2015 Feb;12(2): 143-50). Trachea, and esophagus demonstrate no significant findings. Lungs/Pleura: Unchanged post treatment appearance of the right chest with a small, loculated appearing pleural effusion, associated pleural thickening, and masslike consolidation of the dependent right lung base, with paramedian volume loss and fibrosis involving the perihilar right middle and right lower lobes. Moderate underlying centrilobular emphysema. Musculoskeletal: No chest wall mass or suspicious osseous lesions identified. CT ABDOMEN PELVIS FINDINGS Hepatobiliary: Numerous subtle, hypodense lesions are again seen throughout the liver. At least some of these appear to have slightly enlarged compared to prior examination, most notably in the posterior right lobe of the liver, hepatic segment VII lesion measuring 2.2 x 1.9 cm, previously 1.9 x 1.7 cm (series 2, image 52), and others appear to be new compared to prior examination, for example several subcentimeter lesions in the inferior right lobe of the liver, hepatic segment VI (e.g. Series 2, image 60). Some other lesions however appear to be diminished in size, for example in the lateral right lobe of the liver, hepatic segment VII, a lesion measuring 1.2 x 1.0 cm, previously 2.2 x 1.7 cm (series 2, image 53). Tiny gallstones and or calcific sludge in the gallbladder. Gallbladder wall thickening, or biliary dilatation. Pancreas: Unremarkable. No pancreatic ductal dilatation or surrounding inflammatory changes. Spleen: Normal in size without significant abnormality. Adrenals/Urinary Tract: Adrenal glands are unremarkable. Kidneys are normal, without renal calculi, solid lesion, or hydronephrosis. Bladder is unremarkable. Stomach/Bowel: Stomach is within normal limits. Appendix appears normal. No evidence of bowel wall thickening, distention, or inflammatory changes.  Pancolonic diverticulosis Vascular/Lymphatic: Aortic atherosclerosis. Interval enlargement of portacaval lymph nodes, measuring up to 2.2 x 1.5 cm, previously 1.7 x 0.9 cm (series 2, image 56). Reproductive: No mass or other abnormality. Other: Small, fat containing right inguinal hernia. Umbilical hernia mesh repair no ascites. Musculoskeletal: No acute osseous findings. IMPRESSION: 1. Unchanged post treatment appearance of the right chest with a small, loculated appearing pleural effusion, associated pleural thickening, and masslike consolidation of the dependent right lung base, as well as paramedian volume loss and fibrosis involving the perihilar right middle and right lower lobes. 2. Unchanged, bulky right hilar and pretracheal lymph nodes, as well as an enlarged anterior mediastinal lymph node or pleural-based soft tissue nodule. 3. Numerous subtle, hypodense lesions are again seen throughout the liver. At least some of these appear to have slightly enlarged compared to prior examination, and others appear to be new. Some other lesions however appear to be diminished in size. 4. Interval enlargement of right subpectoral and portacaval lymph nodes. 5. 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 Aortic Atherosclerosis (ICD10-I70.0). Electronically Signed   By: Delanna Ahmadi M.D.   On: 09/20/2021 10:59   CT Abdomen Pelvis W Contrast  Result Date: 09/20/2021 CLINICAL DATA:  Primary Cancer Type: Lung Imaging Indication: Assess response to therapy Interval therapy since last imaging? Yes Initial Cancer Diagnosis Date: 09/27/2019; Established by: Biopsy-proven Detailed Pathology: Stage Ia non-small cell lung cancer, squamous cell carcinoma. Primary Tumor location:  Right upper lobe and right lower lobe.  Recurrence? Yes; Date(s) of recurrence:  03/29/2021; Established by: Biopsy-proven Surgeries: Endobronchial cryotherapy 09/27/2019. Umbilical hernia repair. Chemotherapy: Yes; Ongoing? Yes; Most recent administration: 09/04/2021 Immunotherapy?  Yes; Type: Imfinzi; Ongoing? No Radiation therapy? Yes; Date Range: 11/01/2019 - 11/14/2019; Target: Right lung * Tracking Code: BO * EXAM: CT CHEST, ABDOMEN, AND PELVIS WITH CONTRAST TECHNIQUE: Multidetector CT imaging of the chest, abdomen and pelvis was performed following the standard protocol during bolus administration of intravenous contrast. RADIATION DOSE REDUCTION: This exam was performed according to the departmental dose-optimization program which includes automated exposure control, adjustment of the mA and/or kV according to patient size and/or use of iterative reconstruction technique. CONTRAST:  73mL OMNIPAQUE IOHEXOL 300 MG/ML SOLN, additional oral enteric COMPARISON:  Most recent CT chest 07/26/2021. CT chest, abdomen and pelvis 07/04/2021. 03/13/2021 PET-CT. FINDINGS: CT CHEST FINDINGS Cardiovascular: Aortic atherosclerosis. Left chest port catheter. Normal heart size. Three-vessel coronary artery calcifications. No pericardial effusion. Mediastinum/Nodes: Unchanged, bulky right hilar and pretracheal lymph nodes, measuring up to 4.5 x 4.5 cm (series 2, image 21). Additional enlarged anterior mediastinal lymph node or pleural-based soft tissue nodule is unchanged measuring 3.9 x 2.7 cm (series 2, image 23). Heterogeneous nodule of the right lobe of the thyroid, not clinically significant in the setting of advanced metastatic malignancy. Interval enlargement of right-sided subpectoral lymph nodes, measuring up to 1.4 x 1.1 cm, previously 1.2 x 0.8 cm (series 2, image 17). In the setting of significant comorbidities or limited life expectancy, no follow-up recommended (ref: J Am Coll Radiol. 2015 Feb;12(2): 143-50). Trachea, and esophagus demonstrate no significant findings. Lungs/Pleura: Unchanged  post treatment appearance of the right chest with a small, loculated appearing pleural effusion, associated pleural thickening, and masslike consolidation of the dependent right lung base, with paramedian volume loss and fibrosis involving the perihilar right middle and right lower lobes. Moderate underlying centrilobular emphysema. Musculoskeletal: No chest wall mass or suspicious osseous lesions identified. CT ABDOMEN PELVIS FINDINGS Hepatobiliary: Numerous subtle, hypodense lesions are again seen throughout the liver. At least some of these appear to have slightly enlarged compared to prior examination, most notably in the posterior right lobe of the liver, hepatic segment VII lesion measuring 2.2 x 1.9 cm, previously 1.9 x 1.7 cm (series 2, image 52), and others appear to be new compared to prior examination, for example several subcentimeter lesions in the inferior right lobe of the liver, hepatic segment VI (e.g. Series 2, image 60). Some other lesions however appear to be diminished in size, for example in the lateral right lobe of the liver, hepatic segment VII, a lesion measuring 1.2 x 1.0 cm, previously 2.2 x 1.7 cm (series 2, image 53). Tiny gallstones and or calcific sludge in the gallbladder. Gallbladder wall thickening, or biliary dilatation. Pancreas: Unremarkable. No pancreatic ductal dilatation or surrounding inflammatory changes. Spleen: Normal in size without significant abnormality. Adrenals/Urinary Tract: Adrenal glands are unremarkable. Kidneys are normal, without renal calculi, solid lesion, or hydronephrosis. Bladder is unremarkable. Stomach/Bowel: Stomach is within normal limits. Appendix appears normal. No evidence of bowel wall thickening, distention, or inflammatory changes. Pancolonic diverticulosis Vascular/Lymphatic: Aortic atherosclerosis. Interval enlargement of portacaval lymph nodes, measuring up to 2.2 x 1.5 cm, previously 1.7 x 0.9 cm (series 2, image 56). Reproductive: No mass  or other abnormality. Other: Small, fat containing right inguinal hernia. Umbilical hernia mesh repair no ascites. Musculoskeletal: No acute osseous findings. IMPRESSION: 1. Unchanged post treatment appearance of the right chest with a small, loculated appearing pleural effusion, associated pleural thickening, and masslike  consolidation of the dependent right lung base, as well as paramedian volume loss and fibrosis involving the perihilar right middle and right lower lobes. 2. Unchanged, bulky right hilar and pretracheal lymph nodes, as well as an enlarged anterior mediastinal lymph node or pleural-based soft tissue nodule. 3. Numerous subtle, hypodense lesions are again seen throughout the liver. At least some of these appear to have slightly enlarged compared to prior examination, and others appear to be new. Some other lesions however appear to be diminished in size. 4. Interval enlargement of right subpectoral and portacaval lymph nodes. 5. 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 Aortic Atherosclerosis (ICD10-I70.0). Electronically Signed   By: Delanna Ahmadi M.D.   On: 09/20/2021 10:59     Subjective: - no chest pain, shortness of breath, no abdominal pain, nausea or vomiting.   Discharge Exam: BP 124/73 (BP Location: Right Arm)   Pulse 84   Temp 97.8 F (36.6 C) (Oral)   Resp (!) 22   Ht 6' (1.829 m)   Wt 90.7 kg   SpO2 95%   BMI 27.12 kg/m   General: Pt is alert, awake, not in acute distress Cardiovascular: RRR, S1/S2 +, no rubs, no gallops Respiratory: CTA bilaterally, no wheezing, no rhonchi Abdominal: Soft, NT, ND, bowel sounds + Extremities: no edema, no cyanosis   The results of significant diagnostics from this hospitalization (including imaging, microbiology, ancillary and laboratory) are listed below for  reference.     Microbiology: Recent Results (from the past 240 hour(s))  Blood Culture (routine x 2)     Status: None   Collection Time: 09/27/21  4:40 PM   Specimen: BLOOD  Result Value Ref Range Status   Specimen Description   Final    BLOOD LEFT ANTECUBITAL Performed at [facility] [address] [place], [place] [address]    Special Requests   Final    BOTTLES DRAWN AEROBIC AND ANAEROBIC Blood Culture results may not be optimal due to an inadequate volume of blood received in culture bottles Performed at [facility] [address], [place], [place] [address]    Culture   Final    NO GROWTH 5 DAYS Performed at [facility], [place] [address], [place], [place] [address]    Report Status 10/02/2021 FINAL  Final  Blood Culture (routine x 2)     Status: None   Collection Time: 09/27/21  4:45 PM   Specimen: BLOOD  Result Value Ref Range Status   Specimen Description   Final    BLOOD BLOOD LEFT FOREARM Performed at [facility] [address] [place], [place] [address]    Special Requests   Final    BOTTLES DRAWN AEROBIC ONLY Blood Culture adequate volume Performed at [facility] [address], [place], [place] [address]    Culture   Final    NO GROWTH 5 DAYS Performed at [facility], [place] [address] [place],  [address]    Report Status 10/02/2021 FINAL  Final  Urine Culture     Status: Abnormal   Collection Time: 09/27/21  7:53 PM   Specimen: In/Out Cath Urine  Result Value Ref Range Status   Specimen Description   Final    IN/OUT CATH URINE Performed at [facility] [address] [place],  [place] [address]    Special Requests   Final    NONE Performed at [facility], [place] [address] [place], [place] [address]    Culture 40,000 COLONIES/mL PSEUDOMONAS AERUGINOSA (A)  Final   Report Status 09/30/2021 FINAL  Final   Organism  ID, Bacteria PSEUDOMONAS AERUGINOSA (A)  Final      Susceptibility   Pseudomonas aeruginosa - MIC*    CEFTAZIDIME 2 SENSITIVE Sensitive     CIPROFLOXACIN <=0.25 SENSITIVE Sensitive     GENTAMICIN <=1 SENSITIVE Sensitive     IMIPENEM <=0.25 SENSITIVE Sensitive     PIP/TAZO <=4 SENSITIVE Sensitive     CEFEPIME 2 SENSITIVE Sensitive     * 40,000 COLONIES/mL PSEUDOMONAS AERUGINOSA  MRSA Next Gen by PCR, Nasal     Status: Abnormal   Collection Time: 09/29/21  9:25 AM   Specimen: Nasal Mucosa; Nasal Swab  Result Value Ref Range Status   MRSA by PCR Next Gen DETECTED (A) NOT DETECTED Final    Comment: CRITICAL RESULT CALLED TO, READ BACK BY AND VERIFIED WITH: SILVA,J. RN ON 09/29/21 @ 1128 BY GOLSONM (NOTE) The GeneXpert MRSA Assay (FDA approved for NASAL specimens only), is one component of a comprehensive MRSA colonization surveillance program. It is not intended to diagnose MRSA infection nor to guide or monitor treatment for MRSA infections. Test performance is not FDA approved in patients less than 52 years old. Performed at [facility], [place] [address] [place], [place] [address]   SARS Coronavirus 2 by RT PCR (hospital order, performed in [facility] hospital lab) *cepheid single result test*     Status: None   Collection Time: 09/29/21  9:01 PM   Specimen: Nasal Swab  Result Value Ref Range Status   SARS Coronavirus 2 by RT PCR NEGATIVE NEGATIVE Final    Comment: (NOTE) SARS-CoV-2 target nucleic acids are NOT DETECTED.  The SARS-CoV-2 RNA is generally detectable in upper and lower respiratory specimens during the acute phase of infection. The lowest concentration of SARS-CoV-2 viral copies this assay can detect is 250 copies / mL. A negative result does not preclude SARS-CoV-2 infection and should not be used as the sole basis for treatment or other patient management decisions.  A negative result may occur with improper specimen collection / handling,  submission of specimen other than nasopharyngeal swab, presence of viral mutation(s) within the areas targeted by this assay, and inadequate number of viral copies (<250 copies / mL). A negative result must be combined with clinical observations, patient history, and epidemiological information.  Fact Sheet for Patients:   https://www.patel.info/  Fact Sheet for Healthcare Providers: https://hall.com/  This test is not yet approved or  cleared by the [place] FDA and has been authorized for detection and/or diagnosis of SARS-CoV-2 by FDA under an Emergency Use Authorization (EUA).  This EUA will remain in effect (meaning this test can be used) for the duration of the COVID-19 declaration under Section 564(b)(1) of the Act, 21 U.S.C. section 360bbb-3(b)(1), unless the authorization is terminated or revoked sooner.  Performed at [facility], [place] [address] [place], [place] [address]      Labs: Basic Metabolic Panel: Recent Labs  Lab 09/27/21 1506 09/27/21 1953 09/28/21 0436 09/29/21 0413 09/30/21 0332 10/02/21 0437  NA 134*  --  134* 134* 139 141  K 4.4  --  4.1 3.9 4.2 4.3  CL 100  --  101 102 104 103  CO2 27  --  27 27 28  33*  GLUCOSE 111*  --  102* 111* 167* 124*  BUN 22  --  20 16 15  27*  CREATININE 1.11 1.12 0.88 0.90 0.75 0.78  CALCIUM 8.7*  --  8.2* 8.0* 8.6* 8.6*  MG  --   --   --  2.0 2.2 2.3  PHOS  --   --   --  2.6 2.6 3.4   Liver Function Tests: Recent Labs  Lab 09/27/21 1506 09/29/21 0413 09/30/21 0332 10/02/21 0437  AST 41 22 25 50*  ALT 39 22 26 65*  ALKPHOS 72 62 77 80  BILITOT 0.9 0.8 0.4 0.3  PROT 6.4* 5.4* 5.7* 5.6*  ALBUMIN 3.2* 2.5* 2.6* 2.5*   CBC: Recent Labs  Lab 09/27/21 1506 09/27/21 1953 09/28/21 0436 09/29/21 0413 09/30/21 0332 10/02/21 0437  WBC 11.8* 10.6* 10.5 11.6* 8.4 8.0  NEUTROABS 10.4*  --  9.3* 10.5* 7.9* 7.4  HGB 10.4* 9.4* 9.5* 8.9* 9.4* 9.2*   HCT 32.0* 29.1* 29.6* 27.8* 29.5* 28.9*  MCV 98.8 99.0 101.0* 99.6 100.0 99.7  PLT 242 200 187 160 157 168   CBG: No results for input(s): "GLUCAP" in the last 168 hours. Hgb A1c No results for input(s): "HGBA1C" in the last 72 hours. Lipid Profile No results for input(s): "CHOL", "HDL", "LDLCALC", "TRIG", "CHOLHDL", "LDLDIRECT" in the last 72 hours. Thyroid function studies No results for input(s): "TSH", "T4TOTAL", "T3FREE", "THYROIDAB" in the last 72 hours.  Invalid input(s): "FREET3" Urinalysis    Component Value Date/Time   COLORURINE YELLOW 09/27/2021 1953   APPEARANCEUR CLEAR 09/27/2021 1953   LABSPEC 1.018 09/27/2021 1953   PHURINE 6.0 09/27/2021 1953   GLUCOSEU NEGATIVE 09/27/2021 1953   HGBUR NEGATIVE 09/27/2021 1953   BILIRUBINUR NEGATIVE 09/27/2021 1953   BILIRUBINUR negative 09/16/2021 1403   [place] 09/27/2021 1953   PROTEINUR NEGATIVE 09/27/2021 1953   UROBILINOGEN 0.2 09/16/2021 1403   NITRITE NEGATIVE 09/27/2021 1953   LEUKOCYTESUR NEGATIVE 09/27/2021 1953    FURTHER DISCHARGE INSTRUCTIONS:   Get Medicines reviewed and adjusted: Please take all your medications with you for your next visit with your Primary MD   Laboratory/radiological data: Please request your Primary MD to go over all hospital tests and procedure/radiological results at the follow up, please ask your Primary MD to get all Hospital records sent to his/her office.   In some cases, they will be blood work, cultures and biopsy results pending at the time of your discharge. Please request that your primary care M.D. goes through all the records of your hospital data and follows up on these results.   Also Note the following: If you experience worsening of your admission symptoms, develop shortness of breath, life threatening emergency, suicidal or homicidal thoughts you must seek medical attention immediately by calling 911 or calling your MD immediately  if symptoms less  severe.   You must read complete instructions/literature along with all the possible adverse reactions/side effects for all the Medicines you take and that have been prescribed to you. Take any new Medicines after you have completely understood and accpet all the possible adverse reactions/side effects.    Do not drive when taking Pain medications or sleeping medications (Benzodaizepines)   Do not take more than prescribed Pain, Sleep and Anxiety Medications. It is not advisable to combine anxiety,sleep and pain medications without talking with your primary care practitioner   Special Instructions: If you have smoked or chewed Tobacco  in the last 2 yrs please stop  smoking, stop any regular Alcohol  and or any Recreational drug use.   Wear Seat belts while driving.   Please note: You were cared for by a hospitalist during your hospital stay. Once you are discharged, your primary care physician will handle any further medical issues. Please note that NO REFILLS for any discharge medications will be authorized once you are discharged, as it is imperative that you return to your primary care physician (or establish a relationship with a primary care physician if you do not have one) for your post hospital discharge needs so that they can reassess your need for medications and monitor your lab values.  Time coordinating discharge: 35 minutes  SIGNED:  Marzetta Board, MD, PhD 10/03/2021, 8:51 AM

## 2021-10-03 NOTE — TOC Transition Note (Addendum)
Transition of Care [facility]) - CM/SW Discharge Note   Patient Details  Name: Orlondo Holycross MRN: [id number] Date of Birth: [birth year]  Transition of Care [facility] CM/SW Contact:  Leeroy Cha, RN Phone Number: 10/03/2021, 10:52 AM   Clinical Narrative:    Dcd to home with hhc through [place] for p.t.   Final next level of care: [place] Barriers to Discharge: Barriers Resolved   Patient Goals and CMS Choice Patient states their goals for this hospitalization and ongoing recovery are:: to go home CMS Medicare.gov Compare Post Acute Care list provided to:: Patient Choice offered to / list presented to : Patient  Discharge Placement                       Discharge Plan and Services   Discharge Planning Services: CM Consult Post Acute Care Choice: [place]: [place] Date [place]: 10/03/21 Time [place]: 2395 Representative spoke with at [place]: [place]  Social Determinants of Health (SDOH) Interventions     Readmission Risk Interventions    07/31/2021   12:49 PM  Readmission Risk Prevention Plan  Transportation Screening Complete  PCP or Specialist Appt within 5-7 Days Complete  Home Care Screening Complete  Medication Review (RN CM) Complete

## 2021-10-03 NOTE — Progress Notes (Signed)
Occupational Therapy Treatment Patient Details Name: Gerald [name] MRN: [id number] DOB: [birth year] Today's Date: 10/03/2021   History of present illness 81 yo male admitted with Pna, weakness, sepsis. Hx of COPD, lung ca, CAD   OT comments  Patient was able to participate in toileting with RW with S for line management. Patient's discharge plan remains appropriate at this time. OT will continue to follow acutely.     Recommendations for follow up therapy are one component of a multi-disciplinary discharge planning process, led by the attending physician.  Recommendations may be updated based on patient status, additional functional criteria and insurance authorization.    Follow Up Recommendations  No OT follow up    Assistance Recommended at Discharge Frequent or constant Supervision/Assistance  Patient can return home with the following  A little help with bathing/dressing/bathroom;A little help with walking and/or transfers;Assistance with cooking/housework   Equipment Recommendations  None recommended by OT    Recommendations for Other Services      Precautions / Restrictions Precautions Precautions: Fall Precaution Comments: monitor O2 Restrictions Weight Bearing Restrictions: No       Mobility Bed Mobility Overal bed mobility: Needs Assistance Bed Mobility: Supine to Sit     Supine to sit: Min guard     General bed mobility comments: with use of bed rails    Transfers                         Balance Overall balance assessment: Needs assistance         Standing balance support: Bilateral upper extremity supported, During functional activity, Reliant on assistive device for balance Standing balance-Leahy Scale: Fair Standing balance comment: noted to have some instability with movement upon first stand                           ADL either performed or assessed with clinical judgement   ADL Overall ADL's : Needs assistance/impaired                        Lower Body Dressing Details (indicate cue type and reason): patient was able to complete LB dressing sitting EOB with S Toilet Transfer: Nature conservation officer;Ambulation;Rolling walker (2 wheels);Grab bars Toilet Transfer Details (indicate cue type and reason): needed assist for line management. Toileting- Clothing Manipulation and Hygiene: Supervision/safety;Sit to/from stand Toileting - Clothing Manipulation Details (indicate cue type and reason): with RW     Functional mobility during ADLs: Min guard;Rolling walker (2 wheels)      Extremity/Trunk Assessment              Vision       Perception     Praxis      Cognition Arousal/Alertness: Awake/alert Behavior During Therapy: WFL for tasks assessed/performed Overall Cognitive Status: Within Functional Limits for tasks assessed                                          Exercises      Shoulder Instructions       General Comments      Pertinent Vitals/ Pain       Pain Assessment Pain Assessment: No/denies pain  Home Living  Prior Functioning/Environment              Frequency  Min 2X/week        Progress Toward Goals  OT Goals(current goals can now be found in the care plan section)  Progress towards OT goals: Progressing toward goals     Plan Discharge plan remains appropriate    Co-evaluation                 AM-PAC OT "6 Clicks" Daily Activity     Outcome Measure   Help from another person eating meals?: None Help from another person taking care of personal grooming?: None Help from another person toileting, which includes using toliet, bedpan, or urinal?: A Little Help from another person bathing (including washing, rinsing, drying)?: A Little Help from another person to put on and taking off regular upper body clothing?: A Little Help from another person to put on and  taking off regular lower body clothing?: A Little 6 Click Score: 20    End of Session Equipment Utilized During Treatment: Rolling walker (2 wheels);Oxygen  OT Visit Diagnosis: Muscle weakness (generalized) (M62.81)   Activity Tolerance Patient tolerated treatment well   Patient Left in chair;with call bell/phone within reach   Nurse Communication Mobility status        Time: 2831-5176 OT Time Calculation (min): 35 min  Charges: OT General Charges $OT Visit: 1 Visit OT Treatments $Self Care/Home Management : 23-37 mins  Jackelyn Poling OTR/L, MS Acute Rehabilitation Department Office# [phone] Pager# [phone]   Marcellina Millin 10/03/2021, 10:44 AM

## 2021-10-03 NOTE — Telephone Encounter (Signed)
Scheduled per 06/27 los, patient has been called and notified.

## 2021-10-07 ENCOUNTER — Telehealth: Payer: Self-pay | Admitting: General Practice

## 2021-10-07 ENCOUNTER — Telehealth: Payer: Self-pay

## 2021-10-07 ENCOUNTER — Ambulatory Visit (INDEPENDENT_AMBULATORY_CARE_PROVIDER_SITE_OTHER): Payer: Medicare Other | Admitting: Family Medicine

## 2021-10-07 DIAGNOSIS — R3589 Other polyuria: Secondary | ICD-10-CM

## 2021-10-07 DIAGNOSIS — R8290 Unspecified abnormal findings in urine: Secondary | ICD-10-CM

## 2021-10-07 DIAGNOSIS — R350 Frequency of micturition: Secondary | ICD-10-CM | POA: Diagnosis not present

## 2021-10-07 LAB — POCT URINALYSIS DIPSTICK
Bilirubin, UA: NEGATIVE
Blood, UA: NEGATIVE
Glucose, UA: NEGATIVE
Ketones, UA: NEGATIVE
Leukocytes, UA: NEGATIVE
Nitrite, UA: NEGATIVE
Protein, UA: NEGATIVE
Spec Grav, UA: 1.01 (ref 1.010–1.025)
Urobilinogen, UA: 0.2 E.U./dL
pH, UA: 6.5 (ref 5.0–8.0)

## 2021-10-07 NOTE — Telephone Encounter (Signed)
Please be advised that Ms. Schwarting stated she tried to contact Dr. Zigmund Daniel phone line several times. Upon checking messages, there were no messages from Gerald [name].

## 2021-10-07 NOTE — Telephone Encounter (Signed)
Pt's spouse informed.  States that she has contacted urology and pt has an appt tomorrow, but she will come pick up a specimen cup and return sample for UA.  Spouse would like to know if she should restart his Flomax today since they held it last night due to concerns of dehydration.  Please advise.  Charyl Bigger, CMA

## 2021-10-07 NOTE — Progress Notes (Signed)
Medical screening examination/treatment was performed by qualified clinical staff member and as supervising physician I was immediately available for consultation/collaboration. I have reviewed documentation and agree with assessment and plan.  Lizza Huffaker, DO  

## 2021-10-07 NOTE — Telephone Encounter (Signed)
Returned call to patient's wife. She states that patient has an increased urine output. Patient drinking OK. Encouraged wife  to call urologist and PCP to f/u on her question regarding whether or not to continue Flomax.  Checked in with pt at end of day. She was able to contact both urologist and PCP. Pt to come to cancer center for labs tomorrow. Assured wife that if patient is dehydrated it will be addressed.

## 2021-10-07 NOTE — Telephone Encounter (Signed)
Transition Care Management Unsuccessful Follow-up Telephone Call  Date of discharge and from where:  10/03/21 from [facility]  Attempts:  1st Attempt  Reason for unsuccessful TCM follow-up call:  No answer/busy

## 2021-10-07 NOTE — Telephone Encounter (Signed)
Ok to restart flomax.

## 2021-10-07 NOTE — Telephone Encounter (Signed)
Pt's spouse called stating that pt was hospitalized on 09/27/2021 and discharged on 10/03/2021 for pneumonia.  For the last 2 days he has been urinating 300-400 cc every 3 hours and she is concerned that he may get dehydrated.  He does not have an in dwelling cath and denies any dysuria or flank pain.  Should she call his urologist, bring a sample to our office or have an office visit?  Please advise.  Charyl Bigger, CMA

## 2021-10-07 NOTE — Telephone Encounter (Signed)
Pt's spouse informed.  Charyl Bigger, CMA

## 2021-10-07 NOTE — Progress Notes (Signed)
Per Dr. Zigmund Daniel, pt here for UA only d/t excessive urination as reported by pt's spouse.  UA completed and Dr. Zigmund Daniel advised of results.  Also advised Dr. Zigmund Daniel of sediment in urine.  Per Dr. Zigmund Daniel, will send for a microscopic UA.  Pt's spouse advised of results and pending micro.  Pt has appt with urology tomorrow.  Charyl Bigger, CMA

## 2021-10-08 ENCOUNTER — Inpatient Hospital Stay: Payer: Medicare Other

## 2021-10-08 ENCOUNTER — Other Ambulatory Visit: Payer: Self-pay

## 2021-10-08 VITALS — BP 103/60 | HR 74 | Temp 98.2°F | Resp 14

## 2021-10-08 DIAGNOSIS — Z95828 Presence of other vascular implants and grafts: Secondary | ICD-10-CM

## 2021-10-08 DIAGNOSIS — I959 Hypotension, unspecified: Secondary | ICD-10-CM

## 2021-10-08 DIAGNOSIS — C3411 Malignant neoplasm of upper lobe, right bronchus or lung: Secondary | ICD-10-CM | POA: Diagnosis present

## 2021-10-08 DIAGNOSIS — C3431 Malignant neoplasm of lower lobe, right bronchus or lung: Secondary | ICD-10-CM | POA: Diagnosis present

## 2021-10-08 DIAGNOSIS — E860 Dehydration: Secondary | ICD-10-CM | POA: Diagnosis not present

## 2021-10-08 LAB — CBC WITH DIFFERENTIAL (CANCER CENTER ONLY)
Abs Immature Granulocytes: 0.12 10*3/uL — ABNORMAL HIGH (ref 0.00–0.07)
Basophils Absolute: 0 10*3/uL (ref 0.0–0.1)
Basophils Relative: 0 %
Eosinophils Absolute: 0 10*3/uL (ref 0.0–0.5)
Eosinophils Relative: 1 %
HCT: 34.9 % — ABNORMAL LOW (ref 39.0–52.0)
Hemoglobin: 11.4 g/dL — ABNORMAL LOW (ref 13.0–17.0)
Immature Granulocytes: 2 %
Lymphocytes Relative: 7 %
Lymphs Abs: 0.5 10*3/uL — ABNORMAL LOW (ref 0.7–4.0)
MCH: 32.4 pg (ref 26.0–34.0)
MCHC: 32.7 g/dL (ref 30.0–36.0)
MCV: 99.1 fL (ref 80.0–100.0)
Monocytes Absolute: 0.4 10*3/uL (ref 0.1–1.0)
Monocytes Relative: 5 %
Neutro Abs: 6.3 10*3/uL (ref 1.7–7.7)
Neutrophils Relative %: 85 %
Platelet Count: 165 10*3/uL (ref 150–400)
RBC: 3.52 MIL/uL — ABNORMAL LOW (ref 4.22–5.81)
RDW: 16.4 % — ABNORMAL HIGH (ref 11.5–15.5)
WBC Count: 7.3 10*3/uL (ref 4.0–10.5)
nRBC: 0 % (ref 0.0–0.2)

## 2021-10-08 LAB — CMP (CANCER CENTER ONLY)
ALT: 68 U/L — ABNORMAL HIGH (ref 0–44)
AST: 45 U/L — ABNORMAL HIGH (ref 15–41)
Albumin: 3.4 g/dL — ABNORMAL LOW (ref 3.5–5.0)
Alkaline Phosphatase: 98 U/L (ref 38–126)
Anion gap: 4 — ABNORMAL LOW (ref 5–15)
BUN: 23 mg/dL (ref 8–23)
CO2: 33 mmol/L — ABNORMAL HIGH (ref 22–32)
Calcium: 9 mg/dL (ref 8.9–10.3)
Chloride: 99 mmol/L (ref 98–111)
Creatinine: 0.92 mg/dL (ref 0.61–1.24)
GFR, Estimated: >60 mL/min (ref >60)
Glucose, Bld: 166 mg/dL — ABNORMAL HIGH (ref 70–99)
Potassium: 4.6 mmol/L (ref 3.5–5.1)
Sodium: 136 mmol/L (ref 135–145)
Total Bilirubin: 0.4 mg/dL (ref 0.3–1.2)
Total Protein: 6.1 g/dL — ABNORMAL LOW (ref 6.5–8.1)

## 2021-10-08 LAB — URINALYSIS, MICROSCOPIC ONLY: Bacteria, UA: NONE SEEN /HPF

## 2021-10-08 MED ORDER — SODIUM CHLORIDE 0.9% FLUSH
10.0000 mL | Freq: Once | INTRAVENOUS | Status: AC
  Administered 2021-10-08: 10 mL

## 2021-10-08 MED ORDER — SODIUM CHLORIDE 0.9 % IV SOLN: INTRAVENOUS | Status: DC

## 2021-10-08 MED ORDER — HEPARIN SOD (PORK) LOCK FLUSH 100 UNIT/ML IV SOLN
500.0000 [IU] | Freq: Once | INTRAVENOUS | Status: AC
  Administered 2021-10-08: 500 [IU]

## 2021-10-08 NOTE — Telephone Encounter (Signed)
Transition Care Management Follow-up Telephone Call Date of discharge and from where: 10/03/21 from [facility] How have you been since you were released from the hospital? Patient had nurse visit yesterday and has an follow up with urology today. Any questions or concerns? No

## 2021-10-08 NOTE — Progress Notes (Signed)
Patient and spouse were in the lobby wanting to know if he needed to have fluids.  She states that he has been urinating an excessive amount and she is concerned for dehydration.  He denies any dizziness/shortness of breath.  Denies any pain with urination/foul odor with urination.    Vitals were assessed and patient was found to be hypotensive.  Per Dr Julien Nordmann ok to proceed with 1 liter of fluid.

## 2021-10-08 NOTE — Patient Instructions (Signed)
Rehydration, Adult Rehydration is the replacement of body fluids, salts, and minerals (electrolytes) that are lost during dehydration. Dehydration is when there is not enough water or other fluids in the body. This happens when you lose more fluids than you take in. Common causes of dehydration include: Not drinking enough fluids. This can occur when you are ill or doing activities that require a lot of energy, especially in hot weather. Conditions that cause loss of water or other fluids, such as diarrhea, vomiting, sweating, or urinating a lot. Other illnesses, such as fever or infection. Certain medicines, such as those that remove excess fluid from the body (diuretics). Symptoms of mild or moderate dehydration may include thirst, dry lips and mouth, and dizziness. Symptoms of severe dehydration may include increased heart rate, confusion, fainting, and not urinating. For severe dehydration, you may need to get fluids through an IV at the hospital. For mild or moderate dehydration, you can usually rehydrate at home by drinking certain fluids as told by your health care provider. What are the risks? Generally, rehydration is safe. However, taking in too much fluid (overhydration) can be a problem. This is rare. Overhydration can cause an electrolyte imbalance, kidney failure, or a decrease in salt (sodium) levels in the body. Supplies needed You will need an oral rehydration solution (ORS) if your health care provider tells you to use one. This is a drink to treat dehydration. It can be found in pharmacies and retail stores. How to rehydrate Fluids Follow instructions from your health care provider for rehydration. The kind of fluid and the amount you should drink depend on your condition. In general, you should choose drinks that you prefer. If told by your health care provider, drink an ORS. Make an ORS by following instructions on the package. Start by drinking small amounts, about  cup (120  mL) every 5-10 minutes. Slowly increase how much you drink until you have taken the amount recommended by your health care provider. Drink enough clear fluids to keep your urine pale yellow. If you were told to drink an ORS, finish it first, then start slowly drinking other clear fluids. Drink fluids such as: Water. This includes sparkling water and flavored water. Drinking only water can lead to having too little sodium in your body (hyponatremia). Follow the advice of your health care provider. Water from ice chips you suck on. Fruit juice with water you add to it (diluted). Sports drinks. Hot or cold herbal teas. Broth-based soups. Milk or milk products. Food Follow instructions from your health care provider about what to eat while you rehydrate. Your health care provider may recommend that you slowly begin eating regular foods in small amounts. Eat foods that contain a healthy balance of electrolytes, such as bananas, oranges, potatoes, tomatoes, and spinach. Avoid foods that are greasy or contain a lot of sugar. In some cases, you may get nutrition through a feeding tube that is passed through your nose and into your stomach (nasogastric tube, or NG tube). This may be done if you have uncontrolled vomiting or diarrhea. Beverages to avoid  Certain beverages may make dehydration worse. While you rehydrate, avoid drinking alcohol. How to tell if you are recovering from dehydration You may be recovering from dehydration if: You are urinating more often than before you started rehydrating. Your urine is pale yellow. Your energy level improves. You vomit less frequently. You have diarrhea less frequently. Your appetite improves or returns to normal. You feel less dizzy or less light-headed.   Your skin tone and color start to look more normal. Follow these instructions at home: Take over-the-counter and prescription medicines only as told by your health care provider. Do not take sodium  tablets. Doing this can lead to having too much sodium in your body (hypernatremia). Contact a health care provider if: You continue to have symptoms of mild or moderate dehydration, such as: Thirst. Dry lips. Slightly dry mouth. Dizziness. Dark urine or less urine than normal. Muscle cramps. You continue to vomit or have diarrhea. Get help right away if you: Have symptoms of dehydration that get worse. Have a fever. Have a severe headache. Have been vomiting and the following happens: Your vomiting gets worse or does not go away. Your vomit includes blood or green matter (bile). You cannot eat or drink without vomiting. Have problems with urination or bowel movements, such as: Diarrhea that gets worse or does not go away. Blood in your stool (feces). This may cause stool to look black and tarry. Not urinating, or urinating only a small amount of very dark urine, within 6-8 hours. Have trouble breathing. Have symptoms that get worse with treatment. These symptoms may represent a serious problem that is an emergency. Do not wait to see if the symptoms will go away. Get medical help right away. Call your local emergency services (911 in the [place] Do not drive yourself to the hospital. Summary Rehydration is the replacement of body fluids and minerals (electrolytes) that are lost during dehydration. Follow instructions from your health care provider for rehydration. The kind of fluid and amount you should drink depend on your condition. Slowly increase how much you drink until you have taken the amount recommended by your health care provider. Contact your health care provider if you continue to show signs of mild or moderate dehydration. This information is not intended to replace advice given to you by your health care provider. Make sure you discuss any questions you have with your health care provider. Document Revised: 05/18/2019 Document Reviewed: 03/28/2019 Elsevier Patient  Education  2023 [employer]  

## 2021-10-11 ENCOUNTER — Other Ambulatory Visit: Payer: Self-pay | Admitting: Family Medicine

## 2021-10-11 ENCOUNTER — Telehealth: Payer: Self-pay | Admitting: Family Medicine

## 2021-10-11 LAB — LEGIONELLA PNEUMOPHILA SEROGP 1 UR AG: L. pneumophila Serogp 1 Ur Ag: NEGATIVE

## 2021-10-11 MED ORDER — CIPROFLOXACIN HCL 500 MG PO TABS: 500.0000 mg | ORAL_TABLET | Freq: Two times a day (BID) | ORAL | 0 refills | Status: AC

## 2021-10-11 NOTE — Telephone Encounter (Signed)
Urine with pseudomonas.  Sensitive to cipro.  Treating x10 days.  Call if symptoms persist.

## 2021-10-14 MED FILL — Dexamethasone Sodium Phosphate Inj 100 MG/10ML: INTRAMUSCULAR | Qty: 1 | Status: AC

## 2021-10-15 ENCOUNTER — Inpatient Hospital Stay: Payer: Medicare Other

## 2021-10-15 ENCOUNTER — Inpatient Hospital Stay (HOSPITAL_BASED_OUTPATIENT_CLINIC_OR_DEPARTMENT_OTHER): Payer: Medicare Other | Admitting: Internal Medicine

## 2021-10-15 ENCOUNTER — Other Ambulatory Visit: Payer: Self-pay

## 2021-10-15 VITALS — BP 82/50 | HR 92 | Temp 97.9°F | Wt 194.4 lb

## 2021-10-15 DIAGNOSIS — C3490 Malignant neoplasm of unspecified part of unspecified bronchus or lung: Secondary | ICD-10-CM | POA: Diagnosis not present

## 2021-10-15 DIAGNOSIS — Z95828 Presence of other vascular implants and grafts: Secondary | ICD-10-CM

## 2021-10-15 DIAGNOSIS — E860 Dehydration: Secondary | ICD-10-CM | POA: Diagnosis not present

## 2021-10-15 DIAGNOSIS — C3411 Malignant neoplasm of upper lobe, right bronchus or lung: Secondary | ICD-10-CM

## 2021-10-15 LAB — CBC WITH DIFFERENTIAL (CANCER CENTER ONLY)
Abs Immature Granulocytes: 0.08 10*3/uL — ABNORMAL HIGH (ref 0.00–0.07)
Basophils Absolute: 0 10*3/uL (ref 0.0–0.1)
Basophils Relative: 0 %
Eosinophils Absolute: 0.1 10*3/uL (ref 0.0–0.5)
Eosinophils Relative: 1 %
HCT: 35.1 % — ABNORMAL LOW (ref 39.0–52.0)
Hemoglobin: 11.5 g/dL — ABNORMAL LOW (ref 13.0–17.0)
Immature Granulocytes: 1 %
Lymphocytes Relative: 8 %
Lymphs Abs: 0.6 10*3/uL — ABNORMAL LOW (ref 0.7–4.0)
MCH: 32.2 pg (ref 26.0–34.0)
MCHC: 32.8 g/dL (ref 30.0–36.0)
MCV: 98.3 fL (ref 80.0–100.0)
Monocytes Absolute: 1 10*3/uL (ref 0.1–1.0)
Monocytes Relative: 13 %
Neutro Abs: 5.7 10*3/uL (ref 1.7–7.7)
Neutrophils Relative %: 77 %
Platelet Count: 175 10*3/uL (ref 150–400)
RBC: 3.57 MIL/uL — ABNORMAL LOW (ref 4.22–5.81)
RDW: 16.1 % — ABNORMAL HIGH (ref 11.5–15.5)
WBC Count: 7.5 10*3/uL (ref 4.0–10.5)
nRBC: 0 % (ref 0.0–0.2)

## 2021-10-15 LAB — CMP (CANCER CENTER ONLY)
ALT: 53 U/L — ABNORMAL HIGH (ref 0–44)
AST: 37 U/L (ref 15–41)
Albumin: 3.4 g/dL — ABNORMAL LOW (ref 3.5–5.0)
Alkaline Phosphatase: 123 U/L (ref 38–126)
Anion gap: 5 (ref 5–15)
BUN: 17 mg/dL (ref 8–23)
CO2: 30 mmol/L (ref 22–32)
Calcium: 8.8 mg/dL — ABNORMAL LOW (ref 8.9–10.3)
Chloride: 100 mmol/L (ref 98–111)
Creatinine: 0.93 mg/dL (ref 0.61–1.24)
GFR, Estimated: >60 mL/min (ref >60)
Glucose, Bld: 108 mg/dL — ABNORMAL HIGH (ref 70–99)
Potassium: 4.1 mmol/L (ref 3.5–5.1)
Sodium: 135 mmol/L (ref 135–145)
Total Bilirubin: 0.6 mg/dL (ref 0.3–1.2)
Total Protein: 6.1 g/dL — ABNORMAL LOW (ref 6.5–8.1)

## 2021-10-15 MED ORDER — SODIUM CHLORIDE 0.9% FLUSH
10.0000 mL | Freq: Once | INTRAVENOUS | Status: AC
  Administered 2021-10-15: 10 mL

## 2021-10-15 MED ORDER — SODIUM CHLORIDE 0.9 % IV SOLN: INTRAVENOUS | Status: AC

## 2021-10-15 NOTE — Patient Instructions (Signed)
Rehydration, Adult Rehydration is the replacement of body fluids, salts, and minerals (electrolytes) that are lost during dehydration. Dehydration is when there is not enough water or other fluids in the body. This happens when you lose more fluids than you take in. Common causes of dehydration include: Not drinking enough fluids. This can occur when you are ill or doing activities that require a lot of energy, especially in hot weather. Conditions that cause loss of water or other fluids, such as diarrhea, vomiting, sweating, or urinating a lot. Other illnesses, such as fever or infection. Certain medicines, such as those that remove excess fluid from the body (diuretics). Symptoms of mild or moderate dehydration may include thirst, dry lips and mouth, and dizziness. Symptoms of severe dehydration may include increased heart rate, confusion, fainting, and not urinating. For severe dehydration, you may need to get fluids through an IV at the hospital. For mild or moderate dehydration, you can usually rehydrate at home by drinking certain fluids as told by your health care provider. What are the risks? Generally, rehydration is safe. However, taking in too much fluid (overhydration) can be a problem. This is rare. Overhydration can cause an electrolyte imbalance, kidney failure, or a decrease in salt (sodium) levels in the body. Supplies needed You will need an oral rehydration solution (ORS) if your health care provider tells you to use one. This is a drink to treat dehydration. It can be found in pharmacies and retail stores. How to rehydrate Fluids Follow instructions from your health care provider for rehydration. The kind of fluid and the amount you should drink depend on your condition. In general, you should choose drinks that you prefer. If told by your health care provider, drink an ORS. Make an ORS by following instructions on the package. Start by drinking small amounts, about  cup (120  mL) every 5-10 minutes. Slowly increase how much you drink until you have taken the amount recommended by your health care provider. Drink enough clear fluids to keep your urine pale yellow. If you were told to drink an ORS, finish it first, then start slowly drinking other clear fluids. Drink fluids such as: Water. This includes sparkling water and flavored water. Drinking only water can lead to having too little sodium in your body (hyponatremia). Follow the advice of your health care provider. Water from ice chips you suck on. Fruit juice with water you add to it (diluted). Sports drinks. Hot or cold herbal teas. Broth-based soups. Milk or milk products. Food Follow instructions from your health care provider about what to eat while you rehydrate. Your health care provider may recommend that you slowly begin eating regular foods in small amounts. Eat foods that contain a healthy balance of electrolytes, such as bananas, oranges, potatoes, tomatoes, and spinach. Avoid foods that are greasy or contain a lot of sugar. In some cases, you may get nutrition through a feeding tube that is passed through your nose and into your stomach (nasogastric tube, or NG tube). This may be done if you have uncontrolled vomiting or diarrhea. Beverages to avoid  Certain beverages may make dehydration worse. While you rehydrate, avoid drinking alcohol. How to tell if you are recovering from dehydration You may be recovering from dehydration if: You are urinating more often than before you started rehydrating. Your urine is pale yellow. Your energy level improves. You vomit less frequently. You have diarrhea less frequently. Your appetite improves or returns to normal. You feel less dizzy or less light-headed.   Your skin tone and color start to look more normal. Follow these instructions at home: Take over-the-counter and prescription medicines only as told by your health care provider. Do not take sodium  tablets. Doing this can lead to having too much sodium in your body (hypernatremia). Contact a health care provider if: You continue to have symptoms of mild or moderate dehydration, such as: Thirst. Dry lips. Slightly dry mouth. Dizziness. Dark urine or less urine than normal. Muscle cramps. You continue to vomit or have diarrhea. Get help right away if you: Have symptoms of dehydration that get worse. Have a fever. Have a severe headache. Have been vomiting and the following happens: Your vomiting gets worse or does not go away. Your vomit includes blood or green matter (bile). You cannot eat or drink without vomiting. Have problems with urination or bowel movements, such as: Diarrhea that gets worse or does not go away. Blood in your stool (feces). This may cause stool to look black and tarry. Not urinating, or urinating only a small amount of very dark urine, within 6-8 hours. Have trouble breathing. Have symptoms that get worse with treatment. These symptoms may represent a serious problem that is an emergency. Do not wait to see if the symptoms will go away. Get medical help right away. Call your local emergency services (911 in the [place] Do not drive yourself to the hospital. Summary Rehydration is the replacement of body fluids and minerals (electrolytes) that are lost during dehydration. Follow instructions from your health care provider for rehydration. The kind of fluid and amount you should drink depend on your condition. Slowly increase how much you drink until you have taken the amount recommended by your health care provider. Contact your health care provider if you continue to show signs of mild or moderate dehydration. This information is not intended to replace advice given to you by your health care provider. Make sure you discuss any questions you have with your health care provider. Document Revised: 05/18/2019 Document Reviewed: 03/28/2019 Elsevier Patient  Education  2023 [employer]  

## 2021-10-15 NOTE — Progress Notes (Signed)
[facility] [phone]) [phone]   Fax:([phone]) [phone]  OFFICE PROGRESS NOTE  [name], DO [place]  [address] [place] [address]  DIAGNOSIS: Extensive stage (T2 a, N2, M1b) small cell lung cancer that was initially diagnosed as synchronous stage Ia non-small cell lung cancer, squamous cell carcinoma involving right upper lobe pulmonary nodule in addition to right lower lobe endobronchial lesion diagnosed in June 2021.  The patient has evidence for disease recurrence and metastasis in December 2022.  PRIOR THERAPY:  1) SBRT to the right upper lobe pulmonary nodule as well as the right lower lobe pulmonary nodule under the care of Dr. Sondra Come completed on 11/14/2019. 2) Palliative systemic chemotherapy with carboplatin for an AUC of 5, etoposide 100 mg/m2, and Imfinzi 1500 mg IV every 3 weeks with Cosela.  First dose expected on 04/16/21.  Status post 4 cycles.   CURRENT THERAPY: Zepzelca (lurbinectedin) 3.2 Mg/M2 every 3 weeks.  First dose July 16, 2021.  Status post 4 cycles.  Starting from cycle #2 his dose of Zepzelca (lurbinectedin) was reduced to 2.6 Mg/M2 secondary to significant pancytopenia.  INTERVAL HISTORY: Gerald [name] 81 y.o. male returns to the clinic today for follow-up visit.  The patient was admitted recently to the hospital with pneumonia and urinary tract infection.  He was treated with amoxicillin and Cipro.  He is feeling a little bit better today.  He denied having any current chest pain but has the baseline shortness of breath and he is currently on home oxygen.  He has mild cough with no hemoptysis.  He has no nausea, vomiting, diarrhea or constipation.  He has no current fever or chills.  He is here today for evaluation before starting cycle #5 of his treatment.   MEDICAL HISTORY: Past Medical History:  Diagnosis Date   Bradycardia    Bursitis    Carotid artery disease (HCC)    COPD (chronic obstructive pulmonary  disease) (HCC)    Coronary artery disease    CTO mid-dist CX (fills from left-to-left collaterals), 99% OM2, 30% ost-dist LM, 30% prox-mid LAD, 30% ost-mid CX, 99% prox RCA (too small for PCI), medical management 02/09/20   Hypercholesterolemia    Ischemic cardiomyopathy    Lung cancer ([place])    Lung nodule    nscl ca dx'd 08/2019   Psoriasis    Tobacco abuse    Wears glasses    reading    ALLERGIES:  is allergic to cosela [trilaciclib].  MEDICATIONS:  Current Outpatient Medications  Medication Sig Dispense Refill   albuterol (VENTOLIN HFA) 108 (90 Base) MCG/ACT inhaler Inhale 2 puffs into the lungs every 6 (six) hours as needed for wheezing or shortness of breath. (Patient not taking: Reported on 09/27/2021) 18 g 2   AMBULATORY NON FORMULARY MEDICATION Please provide 4 wheel rolling walker with seat for [employer].  Diagnosis: R26.81 Gait instability 1 Device 0   amoxicillin-clavulanate (AUGMENTIN) 875-125 MG tablet Take 1 tablet by mouth every 12 (twelve) hours for 14 days. 28 tablet 0   aspirin EC 81 MG tablet Take 1 tablet (81 mg total) by mouth daily. Swallow whole. 90 tablet 3   ciprofloxacin (CIPRO) 500 MG tablet Take 1 tablet (500 mg total) by mouth 2 (two) times daily for 10 days. 20 tablet 0   clobetasol cream (TEMOVATE) 2.97 % Apply 1 application topically daily as needed (Psoriasis).     docusate sodium (COLACE) 100 MG capsule Take 200 mg  by mouth daily.     Fluticasone-Umeclidin-Vilant (TRELEGY ELLIPTA) 100-62.5-25 MCG/ACT AEPB Inhale 1 puff into the lungs daily.     guaiFENesin (MUCINEX) 600 MG 12 hr tablet Take 2 tablets (1,200 mg total) by mouth 2 (two) times daily for 14 days. 56 tablet 0   hydrocortisone 1 % ointment Apply 1 application. topically 2 (two) times daily. To the rash. Please follow-up with your dermatologist. (Patient not taking: Reported on 09/27/2021) 30 g 0   lidocaine-prilocaine (EMLA) cream Apply to the Port-A-Cath site 30-60-minute before chemotherapy.  30 g 1   Multiple Vitamins-Minerals (PRESERVISION AREDS 2 PO) Take 1 tablet by mouth in the morning and at bedtime.     niacinamide 500 MG tablet Take 500 mg by mouth daily with breakfast.     polyethylene glycol (MIRALAX) 17 g packet Take 17 g by mouth daily as needed for moderate constipation.     prochlorperazine (COMPAZINE) 10 MG tablet Take 1 tablet (10 mg total) by mouth every 6 (six) hours as needed. (Patient taking differently: Take 10 mg by mouth every 6 (six) hours as needed for vomiting or nausea.) 30 tablet 2   rosuvastatin (CRESTOR) 20 MG tablet Take 1 tablet (20 mg total) by mouth daily. 90 tablet 3   tamsulosin (FLOMAX) 0.4 MG CAPS capsule Take 0.4 mg by mouth every evening.     triamcinolone cream (KENALOG) 0.1 % Apply 1 application topically daily as needed (skin irritation.).     vitamin B-12 (CYANOCOBALAMIN) 500 MCG tablet Take 500 mcg by mouth every evening.     No current facility-administered medications for this visit.    SURGICAL HISTORY:  Past Surgical History:  Procedure Laterality Date   BRONCHIAL BIOPSY  09/27/2019   Procedure: BRONCHIAL BIOPSIES;  Surgeon: Garner Nash, DO;  Location: [facility];  Service: Pulmonary;;   BRONCHIAL BRUSHINGS  09/27/2019   Procedure: BRONCHIAL BRUSHINGS;  Surgeon: Garner Nash, DO;  Location: [facility];  Service: Pulmonary;;   BRONCHIAL NEEDLE ASPIRATION BIOPSY  09/27/2019   Procedure: BRONCHIAL NEEDLE ASPIRATION BIOPSIES;  Surgeon: Garner Nash, DO;  Location: [facility];  Service: Pulmonary;;   BRONCHIAL NEEDLE ASPIRATION BIOPSY  03/29/2021   Procedure: BRONCHIAL NEEDLE ASPIRATION BIOPSIES;  Surgeon: Garner Nash, DO;  Location: [facility];  Service: Pulmonary;;   BRONCHIAL WASHINGS  09/27/2019   Procedure: BRONCHIAL WASHINGS;  Surgeon: Garner Nash, DO;  Location: [facility];  Service: Pulmonary;;   BRONCHIAL WASHINGS  03/29/2021   Procedure: BRONCHIAL WASHINGS;  Surgeon: Garner Nash, DO;   Location: [facility];  Service: Pulmonary;;   COLONOSCOPY     CRYOTHERAPY  09/27/2019   Procedure: Cydney Ok;  Surgeon: Garner Nash, DO;  Location: [facility];  Service: Pulmonary;;   CRYOTHERAPY  03/29/2021   Procedure: CRYOTHERAPY;  Surgeon: Garner Nash, DO;  Location: [facility];  Service: Pulmonary;;   FIDUCIAL MARKER PLACEMENT  09/27/2019   Procedure: FIDUCIAL MARKER PLACEMENT;  Surgeon: Garner Nash, DO;  Location: [facility];  Service: Pulmonary;;   HEMOSTASIS CONTROL  09/27/2019   Procedure: HEMOSTASIS CONTROL;  Surgeon: Garner Nash, DO;  Location: [facility];  Service: Pulmonary;;   HERNIA REPAIR     umbicial- no longer as a "belly button"   IR IMAGING GUIDED PORT INSERTION  05/21/2021   IR US GUIDE VASC ACCESS LEFT  05/21/2021   RIGHT/LEFT HEART CATH AND CORONARY ANGIOGRAPHY N/A 02/09/2020   Procedure: RIGHT/LEFT HEART CATH AND CORONARY ANGIOGRAPHY;  Surgeon: Burnell Blanks,  MD;  Location: [facility];  Service: Cardiovascular;  Laterality: N/A;   VIDEO BRONCHOSCOPY WITH ENDOBRONCHIAL NAVIGATION N/A 09/27/2019   Procedure: VIDEO BRONCHOSCOPY WITH ENDOBRONCHIAL NAVIGATION;  Surgeon: Garner Nash, DO;  Location: [facility];  Service: Pulmonary;  Laterality: N/A;   VIDEO BRONCHOSCOPY WITH ENDOBRONCHIAL ULTRASOUND N/A 03/29/2021   Procedure: VIDEO BRONCHOSCOPY WITH ENDOBRONCHIAL ULTRASOUND;  Surgeon: Garner Nash, DO;  Location: [facility];  Service: Pulmonary;  Laterality: N/A;    REVIEW OF SYSTEMS:  Constitutional: positive for fatigue Eyes: negative Ears, nose, mouth, throat, and face: negative Respiratory: positive for cough and dyspnea on exertion Cardiovascular: negative Gastrointestinal: negative Genitourinary:negative Integument/breast: negative Hematologic/lymphatic: negative Musculoskeletal:positive for arthralgias and muscle weakness Neurological: negative Behavioral/Psych: negative Endocrine:  negative Allergic/Immunologic: negative   PHYSICAL EXAMINATION: General appearance: alert, cooperative, fatigued, and no distress Head: Normocephalic, without obvious abnormality, atraumatic Neck: no adenopathy, no JVD, supple, symmetrical, trachea midline, and thyroid not enlarged, symmetric, no tenderness/mass/nodules Lymph nodes: Cervical, supraclavicular, and axillary nodes normal. Resp: rales bilaterally Back: symmetric, no curvature. ROM normal. No CVA tenderness. Cardio: regular rate and rhythm, S1, S2 normal, no murmur, click, rub or gallop GI: soft, non-tender; bowel sounds normal; no masses,  no organomegaly Extremities: extremities normal, atraumatic, no cyanosis or edema Neurologic: Alert and oriented X 3, normal strength and tone. Normal symmetric reflexes. Normal coordination and gait  ECOG PERFORMANCE STATUS: 1 - Symptomatic but completely ambulatory  Blood pressure (!) 93/57, pulse 92, temperature 97.9 F (36.6 C), weight 194 lb 6.4 oz (88.2 kg), SpO2 98 %.  LABORATORY DATA: Lab Results  Component Value Date   WBC 7.5 10/15/2021   HGB 11.5 (L) 10/15/2021   HCT 35.1 (L) 10/15/2021   MCV 98.3 10/15/2021   PLT 175 10/15/2021      Chemistry      Component Value Date/Time   NA 136 10/08/2021 1312   NA 139 02/07/2020 1208   K 4.6 10/08/2021 1312   CL 99 10/08/2021 1312   CO2 33 (H) 10/08/2021 1312   BUN 23 10/08/2021 1312   BUN 17 02/07/2020 1208   CREATININE 0.92 10/08/2021 1312      Component Value Date/Time   CALCIUM 9.0 10/08/2021 1312   ALKPHOS 98 10/08/2021 1312   AST 45 (H) 10/08/2021 1312   ALT 68 (H) 10/08/2021 1312   BILITOT 0.4 10/08/2021 1312       RADIOGRAPHIC STUDIES: DG CHEST PORT 1 VIEW  Result Date: 10/02/2021 CLINICAL DATA:  History [id number] shortness of breath EXAM: PORTABLE CHEST 1 VIEW COMPARISON:  CT dated September 19, 2021, radiograph dated July second-4, 2023 FINDINGS: The cardiomediastinal silhouette is unchanged in contour.LEFT chest  port with tip terminating over the superior cavoatrial junction. Revisualization of RIGHT-sided volume loss with a moderate RIGHT pleural effusion/pleural thickening. Overall aeration of the RIGHT lung is improved since July 2nd. Similar comparison to prior. Similar appearance of interstitial markings throughout the RIGHT lung. No pneumothorax. No acute pleuroparenchymal abnormality is visualized in the LEFT lung. Visualized abdomen is unremarkable. IMPRESSION: Overall improved aeration of the RIGHT lung in comparison to September 29, 2021. It is relatively unchanged in comparison to prior radiograph. Electronically Signed   By: Valentino Saxon M.D.   On: 10/02/2021 07:59   DG CHEST PORT 1 VIEW  Result Date: 10/01/2021 CLINICAL DATA:  COPD and small-cell lung cancer. EXAM: PORTABLE CHEST 1 VIEW COMPARISON:  09/30/2021 FINDINGS: Similar volume loss right hemithorax with irregular circumferential right-sided pleural thickening given differential positioning between the  2 studies. Left lung remains hyperexpanded but relatively clear. The cardio pericardial silhouette is enlarged. Left Port-A-Cath again noted. Telemetry leads overlie the chest. IMPRESSION: No substantial change in exam given differential positioning. Electronically Signed   By: Misty Stanley M.D.   On: 10/01/2021 10:51   DG CHEST PORT 1 VIEW  Result Date: 09/30/2021 CLINICAL DATA:  Pneumonia EXAM: PORTABLE CHEST 1 VIEW COMPARISON:  Radiograph 09/29/2021 FINDINGS: Persistent near complete right lung opacification with minimal improvement in upper lung aeration. Left lung is clear. Unchanged chest port catheter. Unchanged cardiomediastinal silhouette. No pneumothorax. Bones are unchanged. IMPRESSION: Persistent near complete right lung opacification with minimal improvement in upper lung aeration. Electronically Signed   By: Maurine Simmering M.D.   On: 09/30/2021 08:19   DG CHEST PORT 1 VIEW  Result Date: 09/29/2021 CLINICAL DATA:  Pneumonia.  History of  lung cancer. EXAM: PORTABLE CHEST 1 VIEW COMPARISON:  Radiographs 09/27/2021 and 07/30/2021.  CT 09/19/2021. FINDINGS: 0508 hours. Left-sided Port-A-Cath appears unchanged at the level of the superior cavoatrial junction. Progressive volume loss and opacity in the right hemithorax which is now nearly completely opacified. The left lung remains clear. No evidence of left pleural effusion or pneumothorax. The right-sided mediastinal contours are obscured by the right hemithorax opacification. Patient has known mediastinal and right hilar adenopathy on recent CT. The bones appear unchanged.  Telemetry leads overlie the chest. IMPRESSION: 1. Progressive volume loss and opacification of the right hemithorax consistent with pneumonia and/or posterior obstructive pneumonitis from the central tumor demonstrated on CT. 2. The left lung remains clear. Electronically Signed   By: Richardean Sale M.D.   On: 09/29/2021 08:42   DG Chest 2 View  Result Date: 09/27/2021 CLINICAL DATA:  Fever.  Lung cancer EXAM: CHEST - 2 VIEW COMPARISON:  07/30/2021, 09/19/2021 FINDINGS: Left-sided chest port stable in positioning. Stable cardiomediastinal contours with prominent right paratracheal and right perihilar nodularity compatible with known adenopathy. Chronic small right pleural effusion. Progressive airspace opacity within the right lung base. Fiduciary markers in the right lung are present. Left lung is clear. No pneumothorax. IMPRESSION: 1. Progressive airspace opacity within the right lung base concerning for infection. 2. Chronic small right pleural effusion. Electronically Signed   By: Davina Poke D.O.   On: 09/27/2021 15:44   CT Chest W Contrast  Result Date: 09/20/2021 CLINICAL DATA:  Primary Cancer Type: Lung Imaging Indication: Assess response to therapy Interval therapy since last imaging? Yes Initial Cancer Diagnosis Date: 09/27/2019; Established by: Biopsy-proven Detailed Pathology: Stage Ia non-small cell lung  cancer, squamous cell carcinoma. Primary Tumor location:  Right upper lobe and right lower lobe. Recurrence? Yes; Date(s) of recurrence: 03/29/2021; Established by: Biopsy-proven Surgeries: Endobronchial cryotherapy 09/27/2019. Umbilical hernia repair. Chemotherapy: Yes; Ongoing? Yes; Most recent administration: 09/04/2021 Immunotherapy?  Yes; Type: Imfinzi; Ongoing? No Radiation therapy? Yes; Date Range: 11/01/2019 - 11/14/2019; Target: Right lung * Tracking Code: BO * EXAM: CT CHEST, ABDOMEN, AND PELVIS WITH CONTRAST TECHNIQUE: Multidetector CT imaging of the chest, abdomen and pelvis was performed following the standard protocol during bolus administration of intravenous contrast. RADIATION DOSE REDUCTION: This exam was performed according to the departmental dose-optimization program which includes automated exposure control, adjustment of the mA and/or kV according to patient size and/or use of iterative reconstruction technique. CONTRAST:  80mL OMNIPAQUE IOHEXOL 300 MG/ML SOLN, additional oral enteric COMPARISON:  Most recent CT chest 07/26/2021. CT chest, abdomen and pelvis 07/04/2021. 03/13/2021 PET-CT. FINDINGS: CT CHEST FINDINGS Cardiovascular: Aortic atherosclerosis. Left chest port catheter.  Normal heart size. Three-vessel coronary artery calcifications. No pericardial effusion. Mediastinum/Nodes: Unchanged, bulky right hilar and pretracheal lymph nodes, measuring up to 4.5 x 4.5 cm (series 2, image 21). Additional enlarged anterior mediastinal lymph node or pleural-based soft tissue nodule is unchanged measuring 3.9 x 2.7 cm (series 2, image 23). Heterogeneous nodule of the right lobe of the thyroid, not clinically significant in the setting of advanced metastatic malignancy. Interval enlargement of right-sided subpectoral lymph nodes, measuring up to 1.4 x 1.1 cm, previously 1.2 x 0.8 cm (series 2, image 17). In the setting of significant comorbidities or limited life expectancy, no follow-up  recommended (ref: J Am Coll Radiol. 2015 Feb;12(2): 143-50). Trachea, and esophagus demonstrate no significant findings. Lungs/Pleura: Unchanged post treatment appearance of the right chest with a small, loculated appearing pleural effusion, associated pleural thickening, and masslike consolidation of the dependent right lung base, with paramedian volume loss and fibrosis involving the perihilar right middle and right lower lobes. Moderate underlying centrilobular emphysema. Musculoskeletal: No chest wall mass or suspicious osseous lesions identified. CT ABDOMEN PELVIS FINDINGS Hepatobiliary: Numerous subtle, hypodense lesions are again seen throughout the liver. At least some of these appear to have slightly enlarged compared to prior examination, most notably in the posterior right lobe of the liver, hepatic segment VII lesion measuring 2.2 x 1.9 cm, previously 1.9 x 1.7 cm (series 2, image 52), and others appear to be new compared to prior examination, for example several subcentimeter lesions in the inferior right lobe of the liver, hepatic segment VI (e.g. Series 2, image 60). Some other lesions however appear to be diminished in size, for example in the lateral right lobe of the liver, hepatic segment VII, a lesion measuring 1.2 x 1.0 cm, previously 2.2 x 1.7 cm (series 2, image 53). Tiny gallstones and or calcific sludge in the gallbladder. Gallbladder wall thickening, or biliary dilatation. Pancreas: Unremarkable. No pancreatic ductal dilatation or surrounding inflammatory changes. Spleen: Normal in size without significant abnormality. Adrenals/Urinary Tract: Adrenal glands are unremarkable. Kidneys are normal, without renal calculi, solid lesion, or hydronephrosis. Bladder is unremarkable. Stomach/Bowel: Stomach is within normal limits. Appendix appears normal. No evidence of bowel wall thickening, distention, or inflammatory changes. Pancolonic diverticulosis Vascular/Lymphatic: Aortic atherosclerosis.  Interval enlargement of portacaval lymph nodes, measuring up to 2.2 x 1.5 cm, previously 1.7 x 0.9 cm (series 2, image 56). Reproductive: No mass or other abnormality. Other: Small, fat containing right inguinal hernia. Umbilical hernia mesh repair no ascites. Musculoskeletal: No acute osseous findings. IMPRESSION: 1. Unchanged post treatment appearance of the right chest with a small, loculated appearing pleural effusion, associated pleural thickening, and masslike consolidation of the dependent right lung base, as well as paramedian volume loss and fibrosis involving the perihilar right middle and right lower lobes. 2. Unchanged, bulky right hilar and pretracheal lymph nodes, as well as an enlarged anterior mediastinal lymph node or pleural-based soft tissue nodule. 3. Numerous subtle, hypodense lesions are again seen throughout the liver. At least some of these appear to have slightly enlarged compared to prior examination, and others appear to be new. Some other lesions however appear to be diminished in size. 4. Interval enlargement of right subpectoral and portacaval lymph nodes. 5. 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 Aortic Atherosclerosis (ICD10-I70.0). Electronically Signed   By: Delanna Ahmadi M.D.   On: 09/20/2021 10:59   CT  Abdomen Pelvis W Contrast  Result Date: 09/20/2021 CLINICAL DATA:  Primary Cancer Type: Lung Imaging Indication: Assess response to therapy Interval therapy since last imaging? Yes Initial Cancer Diagnosis Date: 09/27/2019; Established by: Biopsy-proven Detailed Pathology: Stage Ia non-small cell lung cancer, squamous cell carcinoma. Primary Tumor location:  Right upper lobe and right lower lobe. Recurrence? Yes; Date(s) of recurrence: 03/29/2021; Established by: Biopsy-proven Surgeries: Endobronchial  cryotherapy 09/27/2019. Umbilical hernia repair. Chemotherapy: Yes; Ongoing? Yes; Most recent administration: 09/04/2021 Immunotherapy?  Yes; Type: Imfinzi; Ongoing? No Radiation therapy? Yes; Date Range: 11/01/2019 - 11/14/2019; Target: Right lung * Tracking Code: BO * EXAM: CT CHEST, ABDOMEN, AND PELVIS WITH CONTRAST TECHNIQUE: Multidetector CT imaging of the chest, abdomen and pelvis was performed following the standard protocol during bolus administration of intravenous contrast. RADIATION DOSE REDUCTION: This exam was performed according to the departmental dose-optimization program which includes automated exposure control, adjustment of the mA and/or kV according to patient size and/or use of iterative reconstruction technique. CONTRAST:  43mL OMNIPAQUE IOHEXOL 300 MG/ML SOLN, additional oral enteric COMPARISON:  Most recent CT chest 07/26/2021. CT chest, abdomen and pelvis 07/04/2021. 03/13/2021 PET-CT. FINDINGS: CT CHEST FINDINGS Cardiovascular: Aortic atherosclerosis. Left chest port catheter. Normal heart size. Three-vessel coronary artery calcifications. No pericardial effusion. Mediastinum/Nodes: Unchanged, bulky right hilar and pretracheal lymph nodes, measuring up to 4.5 x 4.5 cm (series 2, image 21). Additional enlarged anterior mediastinal lymph node or pleural-based soft tissue nodule is unchanged measuring 3.9 x 2.7 cm (series 2, image 23). Heterogeneous nodule of the right lobe of the thyroid, not clinically significant in the setting of advanced metastatic malignancy. Interval enlargement of right-sided subpectoral lymph nodes, measuring up to 1.4 x 1.1 cm, previously 1.2 x 0.8 cm (series 2, image 17). In the setting of significant comorbidities or limited life expectancy, no follow-up recommended (ref: J Am Coll Radiol. 2015 Feb;12(2): 143-50). Trachea, and esophagus demonstrate no significant findings. Lungs/Pleura: Unchanged post treatment appearance of the right chest with a small, loculated  appearing pleural effusion, associated pleural thickening, and masslike consolidation of the dependent right lung base, with paramedian volume loss and fibrosis involving the perihilar right middle and right lower lobes. Moderate underlying centrilobular emphysema. Musculoskeletal: No chest wall mass or suspicious osseous lesions identified. CT ABDOMEN PELVIS FINDINGS Hepatobiliary: Numerous subtle, hypodense lesions are again seen throughout the liver. At least some of these appear to have slightly enlarged compared to prior examination, most notably in the posterior right lobe of the liver, hepatic segment VII lesion measuring 2.2 x 1.9 cm, previously 1.9 x 1.7 cm (series 2, image 52), and others appear to be new compared to prior examination, for example several subcentimeter lesions in the inferior right lobe of the liver, hepatic segment VI (e.g. Series 2, image 60). Some other lesions however appear to be diminished in size, for example in the lateral right lobe of the liver, hepatic segment VII, a lesion measuring 1.2 x 1.0 cm, previously 2.2 x 1.7 cm (series 2, image 53). Tiny gallstones and or calcific sludge in the gallbladder. Gallbladder wall thickening, or biliary dilatation. Pancreas: Unremarkable. No pancreatic ductal dilatation or surrounding inflammatory changes. Spleen: Normal in size without significant abnormality. Adrenals/Urinary Tract: Adrenal glands are unremarkable. Kidneys are normal, without renal calculi, solid lesion, or hydronephrosis. Bladder is unremarkable. Stomach/Bowel: Stomach is within normal limits. Appendix appears normal. No evidence of bowel wall thickening, distention, or inflammatory changes. Pancolonic diverticulosis Vascular/Lymphatic: Aortic atherosclerosis. Interval enlargement of portacaval lymph nodes, measuring up to 2.2 x 1.5  cm, previously 1.7 x 0.9 cm (series 2, image 56). Reproductive: No mass or other abnormality. Other: Small, fat containing right inguinal  hernia. Umbilical hernia mesh repair no ascites. Musculoskeletal: No acute osseous findings. IMPRESSION: 1. Unchanged post treatment appearance of the right chest with a small, loculated appearing pleural effusion, associated pleural thickening, and masslike consolidation of the dependent right lung base, as well as paramedian volume loss and fibrosis involving the perihilar right middle and right lower lobes. 2. Unchanged, bulky right hilar and pretracheal lymph nodes, as well as an enlarged anterior mediastinal lymph node or pleural-based soft tissue nodule. 3. Numerous subtle, hypodense lesions are again seen throughout the liver. At least some of these appear to have slightly enlarged compared to prior examination, and others appear to be new. Some other lesions however appear to be diminished in size. 4. Interval enlargement of right subpectoral and portacaval lymph nodes. 5. 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 Aortic Atherosclerosis (ICD10-I70.0). Electronically Signed   By: Delanna Ahmadi M.D.   On: 09/20/2021 10:59     ASSESSMENT AND PLAN: This is a very pleasant 81 years old white male recently diagnosed with   1) Extensive stage (T2 a, N2, M1 B) small cell lung cancer diagnosed in January 2022. Presented with new masslike area in the medial aspect of the right upper lobe with apparent invasion of the mediastinum and bulky right paratracheal lymphadenopathy in addition to liver lesions. The patient underwent systemic chemotherapy with carboplatin for AUC of 5 on day 1, etoposide 100 Mg/M2 on days 1, 2 and 3 with Cosela 240 Mg/M2 before chemotherapy as well as Imfinzi 1500 Mg IV on day 1 every 3 weeks.  Status post 4 cycles.  He tolerated this treatment well with no concerning adverse effect except for fatigue. Unfortunately  his scan showed evidence for disease progression in the lung and liver. The patient is currently undergoing second line treatment with Zepzelca (lurbinectedin) 3.2 Mg/M2 every 3 weeks status post 4 cycles. The patient was admitted to the hospital recently with questionable pneumonia and urinary tract infection. He was treated with amoxicillin and Cipro and he is feeling a little bit better.  He still very weak to resume his systemic chemotherapy. I recommended for the patient to delay the start of cycle number 5 by 1 week until improvement of his condition. For the dehydration, I will arrange for the patient to receive 1 L of normal saline in the clinic today. I will see him back for follow-up visit in 4 weeks for evaluation with repeat CT scan of the chest, abdomen and pelvis for restaging of his disease. 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 Sondra Come. The patient was advised to call immediately if he has any concerning symptoms in the interval.  The patient voices understanding of current disease status and treatment options and is in agreement with the current care plan.  All questions were answered. The patient knows to call the clinic with any problems, questions or concerns. We can certainly see the patient much sooner if necessary. The total time spent in the appointment was 30 minutes.   Disclaimer: This note was dictated with  voice recognition software. Similar sounding words can inadvertently be transcribed and may not be corrected upon review.

## 2021-10-16 ENCOUNTER — Telehealth: Payer: Self-pay | Admitting: Internal Medicine

## 2021-10-16 NOTE — Telephone Encounter (Signed)
Scheduled per 07/18 los, patient has been called and notified of upcoming appointments.

## 2021-10-18 ENCOUNTER — Ambulatory Visit (INDEPENDENT_AMBULATORY_CARE_PROVIDER_SITE_OTHER): Payer: Medicare Other | Admitting: Family Medicine

## 2021-10-18 ENCOUNTER — Encounter: Payer: Self-pay | Admitting: Family Medicine

## 2021-10-18 VITALS — BP 115/87 | HR 113 | Temp 97.5°F | Ht 72.0 in | Wt 197.1 lb

## 2021-10-18 DIAGNOSIS — L989 Disorder of the skin and subcutaneous tissue, unspecified: Secondary | ICD-10-CM

## 2021-10-18 DIAGNOSIS — R0689 Other abnormalities of breathing: Secondary | ICD-10-CM

## 2021-10-18 DIAGNOSIS — N390 Urinary tract infection, site not specified: Secondary | ICD-10-CM

## 2021-10-18 DIAGNOSIS — B965 Pseudomonas (aeruginosa) (mallei) (pseudomallei) as the cause of diseases classified elsewhere: Secondary | ICD-10-CM

## 2021-10-18 DIAGNOSIS — J189 Pneumonia, unspecified organism: Secondary | ICD-10-CM | POA: Diagnosis not present

## 2021-10-18 MED ORDER — LIDOCAINE 5 % EX OINT: 1.0000 | TOPICAL_OINTMENT | CUTANEOUS | 0 refills | Status: DC | PRN

## 2021-10-18 MED ORDER — LIDOCAINE 5 % EX OINT: 1.0000 | TOPICAL_OINTMENT | Freq: Three times a day (TID) | CUTANEOUS | 0 refills | Status: AC | PRN

## 2021-10-18 MED ORDER — VALACYCLOVIR HCL 1 G PO TABS: 1000.0000 mg | ORAL_TABLET | Freq: Two times a day (BID) | ORAL | 0 refills | Status: AC

## 2021-10-18 NOTE — Patient Instructions (Addendum)
Start valtrex  Use lidocaine ointment to area every 8 hours as needed.  Let me know if urinary symptoms return.

## 2021-10-20 ENCOUNTER — Encounter: Payer: Self-pay | Admitting: Family Medicine

## 2021-10-20 DIAGNOSIS — L989 Disorder of the skin and subcutaneous tissue, unspecified: Secondary | ICD-10-CM | POA: Insufficient documentation

## 2021-10-20 DIAGNOSIS — B965 Pseudomonas (aeruginosa) (mallei) (pseudomallei) as the cause of diseases classified elsewhere: Secondary | ICD-10-CM | POA: Insufficient documentation

## 2021-10-20 LAB — SURESWAB HSV, TYPE 1/2 DNA, PCR
HSV 1 DNA: NOT DETECTED
HSV 2 DNA: NOT DETECTED

## 2021-10-20 NOTE — Assessment & Plan Note (Signed)
He has scattered ulcerated lesions at the top of the buttock area.  He is very tender in nature.  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  Starting course of Valtrex, lesions were swabbed today.

## 2021-10-20 NOTE — Assessment & Plan Note (Signed)
He has 1 more day of Augmentin left.  Respiratory symptoms have improved at this point.  Recommend monitoring for any new or worsening symptoms.

## 2021-10-20 NOTE — Assessment & Plan Note (Signed)
Tolerating Cipro well.  He will complete course of this.  Urinary symptoms have improved.

## 2021-10-20 NOTE — Progress Notes (Signed)
Gerald [name] - 81 y.o. male MRN [id number]  Date of birth: 11-Jul-1940  Subjective Chief Complaint  Patient presents with   Hospitalization Follow-up    HPI Gerald [name] is a 81 year old male here to wanting to sit the jeep day for hospital follow-up.  Recent hospitalization for pneumonia, often develops after chemotherapy.  Also had recent urine culture returned positive for Pseudomonas.  Upon admission he was placed on broad-spectrum IV antibiotics as well as steroids.  Limited response initially and pulmonology was consulted.  Bronchoscopy was considered however he did start to show clinical improvement.  He was transitioned to Augmentin for 2 additional weeks for postobstructive pneumonia and placed on steroid taper.  Overall feels better from respiratory standpoint.  He does remain on chronic O2 at 2 L/min.  Additionally he was started on Cipro for Pseudomonas.  Tolerating this pretty well.  He does have some areas on his upper buttocks that are very sore.  Wife reports that these areas do not look bad but he complains of quite a bit of pain around these areas.  Denies any drainage.  No fever or chills.  They have been using Desitin on these areas.  ROS:  A comprehensive ROS was completed and negative except as noted per HPI  Allergies  Allergen Reactions   Cosela [Trilaciclib] Other (See Comments)    thrombophlebitis    Past Medical History:  Diagnosis Date   Bradycardia    Bursitis    Carotid artery disease (HCC)    COPD (chronic obstructive pulmonary disease) (HCC)    Coronary artery disease    CTO mid-dist CX (fills from left-to-left collaterals), 99% OM2, 30% ost-dist LM, 30% prox-mid LAD, 30% ost-mid CX, 99% prox RCA (too small for PCI), medical management 02/09/20   Hypercholesterolemia    Ischemic cardiomyopathy    Lung cancer ([place])    Lung nodule    nscl ca dx'd 08/2019   Psoriasis    Tobacco abuse    Wears glasses    reading    Past Surgical History:  Procedure  Laterality Date   BRONCHIAL BIOPSY  09/27/2019   Procedure: BRONCHIAL BIOPSIES;  Surgeon: Garner Nash, DO;  Location: [facility];  Service: Pulmonary;;   BRONCHIAL BRUSHINGS  09/27/2019   Procedure: BRONCHIAL BRUSHINGS;  Surgeon: Garner Nash, DO;  Location: [facility];  Service: Pulmonary;;   BRONCHIAL NEEDLE ASPIRATION BIOPSY  09/27/2019   Procedure: BRONCHIAL NEEDLE ASPIRATION BIOPSIES;  Surgeon: Garner Nash, DO;  Location: [facility];  Service: Pulmonary;;   BRONCHIAL NEEDLE ASPIRATION BIOPSY  03/29/2021   Procedure: BRONCHIAL NEEDLE ASPIRATION BIOPSIES;  Surgeon: Garner Nash, DO;  Location: [facility];  Service: Pulmonary;;   BRONCHIAL WASHINGS  09/27/2019   Procedure: BRONCHIAL WASHINGS;  Surgeon: Garner Nash, DO;  Location: [facility];  Service: Pulmonary;;   BRONCHIAL WASHINGS  03/29/2021   Procedure: BRONCHIAL WASHINGS;  Surgeon: Garner Nash, DO;  Location: [facility];  Service: Pulmonary;;   COLONOSCOPY     CRYOTHERAPY  09/27/2019   Procedure: Cydney Ok;  Surgeon: Garner Nash, DO;  Location: [facility];  Service: Pulmonary;;   CRYOTHERAPY  03/29/2021   Procedure: CRYOTHERAPY;  Surgeon: Garner Nash, DO;  Location: [facility];  Service: Pulmonary;;   FIDUCIAL MARKER PLACEMENT  09/27/2019   Procedure: FIDUCIAL MARKER PLACEMENT;  Surgeon: Garner Nash, DO;  Location: [facility];  Service: Pulmonary;;   HEMOSTASIS CONTROL  09/27/2019   Procedure: HEMOSTASIS CONTROL;  Surgeon: Garner Nash, DO;  Location: [facility];  Service: Pulmonary;;   HERNIA REPAIR     umbicial- no longer as a "belly button"   IR IMAGING GUIDED PORT INSERTION  05/21/2021   IR US GUIDE VASC ACCESS LEFT  05/21/2021   RIGHT/LEFT HEART CATH AND CORONARY ANGIOGRAPHY N/A 02/09/2020   Procedure: RIGHT/LEFT HEART CATH AND CORONARY ANGIOGRAPHY;  Surgeon: Burnell Blanks, MD;  Location: [facility];  Service: Cardiovascular;  Laterality: N/A;    VIDEO BRONCHOSCOPY WITH ENDOBRONCHIAL NAVIGATION N/A 09/27/2019   Procedure: VIDEO BRONCHOSCOPY WITH ENDOBRONCHIAL NAVIGATION;  Surgeon: Garner Nash, DO;  Location: [facility];  Service: Pulmonary;  Laterality: N/A;   VIDEO BRONCHOSCOPY WITH ENDOBRONCHIAL ULTRASOUND N/A 03/29/2021   Procedure: VIDEO BRONCHOSCOPY WITH ENDOBRONCHIAL ULTRASOUND;  Surgeon: Garner Nash, DO;  Location: [facility];  Service: Pulmonary;  Laterality: N/A;    Social History   Socioeconomic History   Marital status: Married    Spouse name: Not on file   Number of children: Not on file   Years of education: Not on file   Highest education level: Not on file  Occupational History   Not on file  Tobacco Use   Smoking status: Former    Packs/day: 2.00    Years: 62.00    Total pack years: 124.00    Types: Cigarettes    Passive exposure: Past   Smokeless tobacco: Never   Tobacco comments:    Pt currently using nicotine patch.  Vaping Use   Vaping Use: Never used  Substance and Sexual Activity   Alcohol use: Not Currently    Comment: rare   Drug use: Never   Sexual activity: Not Currently  Other Topics Concern   Not on file  Social History Narrative   Not on file   Social Determinants of Health   Financial Resource Strain: Not on file  Food Insecurity: Not on file  Transportation Needs: Not on file  Physical Activity: Not on file  Stress: Not on file  Social Connections: Not on file    Family History  Problem Relation Age of Onset   Heart attack Father    Mesothelioma Sister 44   Lung cancer Brother     Health Maintenance  Topic Date Due   Zoster Vaccines- Shingrix (1 of 2) 11/29/2021 (Originally 07/24/1959)   COVID-19 Vaccine (3 - Moderna risk series) 12/17/2021 (Originally 06/22/2019)   Pneumonia Vaccine 92+ Years old (2 - PPSV23 or PCV20) 07/25/2022 (Originally 05/02/2015)   TETANUS/TDAP  07/25/2022 (Originally 07/24/1959)   INFLUENZA VACCINE  10/29/2021   HPV VACCINES  Aged  Out     ----------------------------------------------------------------------------------------------------------------------------------------------------------------------------------------------------------------- Physical Exam BP 115/87 (BP Location: Left Arm, Patient Position: Sitting, Cuff Size: Normal)   Pulse (!) 113   Temp (!) 97.5 F (36.4 C) (Oral)   Ht 6' (1.829 m)   Wt 197 lb 1.3 oz (89.4 kg)   SpO2 94%   BMI 26.73 kg/m   Physical Exam Constitutional:      Appearance: Normal appearance.  Eyes:     General: No scleral icterus. Cardiovascular:     Rate and Rhythm: Normal rate.  Pulmonary:     Effort: Pulmonary effort is normal.     Breath sounds: Normal breath sounds.  Skin:    Comments: Approximately 5 small ulcerated lesions at the top of the buttock area.  Areas are quite tender.  There is no swelling or drainage.  Neurological:     General: No focal deficit present.     Mental  Status: He is alert.  Psychiatric:        Mood and Affect: Mood normal.        Behavior: Behavior normal.     ------------------------------------------------------------------------------------------------------------------------------------------------------------------------------------------------------------------- Assessment and Plan  Pseudomonas urinary tract infection Tolerating Cipro well.  He will complete course of this.  Urinary symptoms have improved.  Chronic respiratory insufficiency Remains on chronic oxygen.  Pneumonia He has 1 more day of Augmentin left.  Respiratory symptoms have improved at this point.  Recommend monitoring for any new or worsening symptoms.  Skin sore He has scattered ulcerated lesions at the top of the buttock area.  He is very tender in nature.  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  Starting course of Valtrex, lesions were swabbed today.   Meds ordered this encounter  Medications    valACYclovir (VALTREX) 1000 MG tablet    Sig: Take 1 tablet (1,000 mg total) by mouth 2 (two) times daily for 10 days.    Dispense:  20 tablet    Refill:  0   DISCONTD: lidocaine (XYLOCAINE) 5 % ointment    Sig: Apply 1 Application topically as needed.    Dispense:  35.44 g    Refill:  0   lidocaine (XYLOCAINE) 5 % ointment    Sig: Apply 1 Application topically 3 (three) times daily as needed.    Dispense:  35.44 g    Refill:  0    Return in about 4 months (around 02/18/2022) for HTN.    This visit occurred during the SARS-CoV-2 public health emergency.  Safety protocols were in place, including screening questions prior to the visit, additional usage of staff PPE, and extensive cleaning of exam room while observing appropriate contact time as indicated for disinfecting solutions.

## 2021-10-20 NOTE — Assessment & Plan Note (Signed)
Remains on chronic oxygen.

## 2021-10-21 ENCOUNTER — Other Ambulatory Visit: Payer: Self-pay

## 2021-10-21 MED FILL — Dexamethasone Sodium Phosphate Inj 100 MG/10ML: INTRAMUSCULAR | Qty: 1 | Status: AC

## 2021-10-22 ENCOUNTER — Other Ambulatory Visit: Payer: Self-pay

## 2021-10-22 ENCOUNTER — Inpatient Hospital Stay: Payer: Medicare Other

## 2021-10-22 VITALS — BP 122/67 | HR 87 | Temp 97.7°F | Resp 18 | Wt 197.3 lb

## 2021-10-22 DIAGNOSIS — Z95828 Presence of other vascular implants and grafts: Secondary | ICD-10-CM

## 2021-10-22 DIAGNOSIS — C3411 Malignant neoplasm of upper lobe, right bronchus or lung: Secondary | ICD-10-CM

## 2021-10-22 DIAGNOSIS — E860 Dehydration: Secondary | ICD-10-CM | POA: Diagnosis not present

## 2021-10-22 LAB — CBC WITH DIFFERENTIAL (CANCER CENTER ONLY)
Abs Immature Granulocytes: 0.02 10*3/uL (ref 0.00–0.07)
Basophils Absolute: 0 10*3/uL (ref 0.0–0.1)
Basophils Relative: 1 %
Eosinophils Absolute: 0.2 10*3/uL (ref 0.0–0.5)
Eosinophils Relative: 3 %
HCT: 33.5 % — ABNORMAL LOW (ref 39.0–52.0)
Hemoglobin: 11.1 g/dL — ABNORMAL LOW (ref 13.0–17.0)
Immature Granulocytes: 0 %
Lymphocytes Relative: 11 %
Lymphs Abs: 0.7 10*3/uL (ref 0.7–4.0)
MCH: 32.5 pg (ref 26.0–34.0)
MCHC: 33.1 g/dL (ref 30.0–36.0)
MCV: 98 fL (ref 80.0–100.0)
Monocytes Absolute: 0.8 10*3/uL (ref 0.1–1.0)
Monocytes Relative: 12 %
Neutro Abs: 4.7 10*3/uL (ref 1.7–7.7)
Neutrophils Relative %: 73 %
Platelet Count: 166 10*3/uL (ref 150–400)
RBC: 3.42 MIL/uL — ABNORMAL LOW (ref 4.22–5.81)
RDW: 15.8 % — ABNORMAL HIGH (ref 11.5–15.5)
WBC Count: 6.5 10*3/uL (ref 4.0–10.5)
nRBC: 0 % (ref 0.0–0.2)

## 2021-10-22 LAB — CMP (CANCER CENTER ONLY)
ALT: 60 U/L — ABNORMAL HIGH (ref 0–44)
AST: 72 U/L — ABNORMAL HIGH (ref 15–41)
Albumin: 3.5 g/dL (ref 3.5–5.0)
Alkaline Phosphatase: 172 U/L — ABNORMAL HIGH (ref 38–126)
Anion gap: 5 (ref 5–15)
BUN: 12 mg/dL (ref 8–23)
CO2: 30 mmol/L (ref 22–32)
Calcium: 8.8 mg/dL — ABNORMAL LOW (ref 8.9–10.3)
Chloride: 101 mmol/L (ref 98–111)
Creatinine: 0.82 mg/dL (ref 0.61–1.24)
GFR, Estimated: >60 mL/min (ref >60)
Glucose, Bld: 99 mg/dL (ref 70–99)
Potassium: 4.4 mmol/L (ref 3.5–5.1)
Sodium: 136 mmol/L (ref 135–145)
Total Bilirubin: 0.4 mg/dL (ref 0.3–1.2)
Total Protein: 6.2 g/dL — ABNORMAL LOW (ref 6.5–8.1)

## 2021-10-22 MED ORDER — SODIUM CHLORIDE 0.9 % IV SOLN
2.6000 mg/m2 | Freq: Once | INTRAVENOUS | Status: AC
  Administered 2021-10-22: 5.6 mg via INTRAVENOUS
  Filled 2021-10-22: qty 11.2

## 2021-10-22 MED ORDER — SODIUM CHLORIDE 0.9 % IV SOLN
10.0000 mg | Freq: Once | INTRAVENOUS | Status: AC
  Administered 2021-10-22: 10 mg via INTRAVENOUS
  Filled 2021-10-22: qty 10
  Filled 2021-10-22: qty 1

## 2021-10-22 MED ORDER — SODIUM CHLORIDE 0.9% FLUSH
10.0000 mL | INTRAVENOUS | Status: DC | PRN
  Administered 2021-10-22: 10 mL

## 2021-10-22 MED ORDER — SODIUM CHLORIDE 0.9 % IV SOLN: INTRAVENOUS | Status: DC

## 2021-10-22 MED ORDER — SODIUM CHLORIDE 0.9% FLUSH
10.0000 mL | Freq: Once | INTRAVENOUS | Status: AC
  Administered 2021-10-22: 10 mL

## 2021-10-22 MED ORDER — PALONOSETRON HCL INJECTION 0.25 MG/5ML
0.2500 mg | Freq: Once | INTRAVENOUS | Status: AC
  Administered 2021-10-22: 0.25 mg via INTRAVENOUS
  Filled 2021-10-22: qty 5

## 2021-10-22 MED ORDER — HEPARIN SOD (PORK) LOCK FLUSH 100 UNIT/ML IV SOLN
500.0000 [IU] | Freq: Once | INTRAVENOUS | Status: AC | PRN
  Administered 2021-10-22: 500 [IU]

## 2021-10-22 MED ORDER — SODIUM CHLORIDE 0.9 % IV SOLN: Freq: Once | INTRAVENOUS | Status: AC

## 2021-10-22 NOTE — Progress Notes (Signed)
Patient feeling dehydrated and family requested patient get fluids- BP low normal. BP 100/62 (BP Location: Right Arm, Patient Position: Sitting)   Pulse 89   Temp (!) 97.4 F (36.3 C) (Oral)   Resp 18   Wt 197 lb 4.8 oz (89.5 kg)   SpO2 95%   BMI 26.76 kg/m   Order for 55ml of IVF per Dr. Julien Nordmann.

## 2021-10-22 NOTE — Patient Instructions (Signed)
[facility]  Discharge Instructions: Thank you for choosing [facility] to provide your oncology and hematology care.   If you have a lab appointment with the [facility], please go directly to the [facility] and check in at the registration area.   Wear comfortable clothing and clothing appropriate for easy access to any Portacath or PICC line.   We strive to give you quality time with your provider. You may need to reschedule your appointment if you arrive late (15 or more minutes).  Arriving late affects you and other patients whose appointments are after yours.  Also, if you miss three or more appointments without notifying the office, you may be dismissed from the clinic at the provider's discretion.      For prescription refill requests, have your pharmacy contact our office and allow 72 hours for refills to be completed.    Today you received the following chemotherapy and/or immunotherapy agents: Zepzelca      To help prevent nausea and vomiting after your treatment, we encourage you to take your nausea medication as directed.  BELOW ARE SYMPTOMS THAT SHOULD BE REPORTED IMMEDIATELY: *FEVER GREATER THAN 100.4 F (38 C) OR HIGHER *CHILLS OR SWEATING *NAUSEA AND VOMITING THAT IS NOT CONTROLLED WITH YOUR NAUSEA MEDICATION *UNUSUAL SHORTNESS OF BREATH *UNUSUAL BRUISING OR BLEEDING *URINARY PROBLEMS (pain or burning when urinating, or frequent urination) *BOWEL PROBLEMS (unusual diarrhea, constipation, pain near the anus) TENDERNESS IN MOUTH AND THROAT WITH OR WITHOUT PRESENCE OF ULCERS (sore throat, sores in mouth, or a toothache) UNUSUAL RASH, SWELLING OR PAIN  UNUSUAL VAGINAL DISCHARGE OR ITCHING   Items with * indicate a potential emergency and should be followed up as soon as possible or go to the Emergency Department if any problems should occur.  Please show the CHEMOTHERAPY ALERT CARD or IMMUNOTHERAPY ALERT CARD at check-in to  the Emergency Department and triage nurse.  Should you have questions after your visit or need to cancel or reschedule your appointment, please contact [facility]  Dept: [phone]  and follow the prompts.  Office hours are 8:00 a.m. to 4:30 p.m. Monday - Friday. Please note that voicemails left after 4:00 p.m. may not be returned until the following business day.  We are closed weekends and major holidays. You have access to a nurse at all times for urgent questions. Please call the main number to the clinic Dept: [phone] and follow the prompts.   For any non-urgent questions, you may also contact your provider using MyChart. We now offer e-Visits for anyone 67 and older to request care online for non-urgent symptoms. For details visit [email or web address].   Also download the MyChart app! Go to the app store, search "MyChart", open the app, select [facility], and log in with your MyChart username and password.  Masks are optional in the cancer centers. If you would like for your care team to wear a mask while they are taking care of you, please let them know. For doctor visits, patients may have with them one support person who is at least 83 years old. At this time, visitors are not allowed in the infusion area.

## 2021-10-23 ENCOUNTER — Telehealth: Payer: Self-pay

## 2021-10-23 NOTE — Telephone Encounter (Signed)
Pt's spouse lvm requesting additional treatment for herpes. Not clearing with Valtrex.

## 2021-10-24 ENCOUNTER — Encounter: Payer: Self-pay | Admitting: Family Medicine

## 2021-10-24 ENCOUNTER — Other Ambulatory Visit: Payer: Self-pay | Admitting: Family Medicine

## 2021-10-24 MED ORDER — NYSTATIN 100000 UNIT/GM EX CREA: 1.0000 | TOPICAL_CREAM | Freq: Two times a day (BID) | CUTANEOUS | 1 refills | Status: AC

## 2021-10-24 NOTE — Telephone Encounter (Signed)
Swab for HSV is negative.  May d/c valtrex if not improving.  Adding topical antifungal.   CM

## 2021-10-24 NOTE — Telephone Encounter (Signed)
Advised Mrs. Lohse to d/c valtrex if not improving and start Nystatin.   Gerald [name] states the bumps are very sore. The Valtrex and Lidocaine were not helping at all.   Advised her to try the nystatin and callback with updates. She agrees.

## 2021-10-25 ENCOUNTER — Other Ambulatory Visit: Payer: Self-pay | Admitting: Family Medicine

## 2021-10-25 ENCOUNTER — Telehealth: Payer: Self-pay

## 2021-10-25 MED ORDER — HYDROCODONE-ACETAMINOPHEN 5-325 MG PO TABS
0.5000 | ORAL_TABLET | Freq: Four times a day (QID) | ORAL | 0 refills | Status: AC | PRN
Start: 2021-10-25 — End: ?

## 2021-10-25 NOTE — Telephone Encounter (Signed)
Patient's wife requested an antibiotic incase this is the beginning of an infection. This RN advised that an antibiotic cannot be prescribed without a suspected current infection and he would need to be evaluated, however there is no availability in [facility] today. Patient's wife was advised to encourage fluid intake and monitor temperature.

## 2021-10-25 NOTE — Telephone Encounter (Signed)
This RN received call from patient's wife with concern of elevated temperature. Reports max temperature was 98.1. She is concerned because temperature is continuing to rise and patient has hx of pneumonia. This RN advised her to continue to monitor his temperature and to take him to the ER for a fever. This RN advised wife that we unfortunately do not have room in [facility] to evaluate patient but we can see him on Monday should he have symptoms. Wife verbalized understanding that he will need to go to the ER over the weekend if he has a temperature above 100.4.

## 2021-10-28 ENCOUNTER — Telehealth: Payer: Self-pay | Admitting: Medical Oncology

## 2021-10-28 ENCOUNTER — Other Ambulatory Visit: Payer: Self-pay | Admitting: Family Medicine

## 2021-10-28 ENCOUNTER — Encounter: Payer: Self-pay | Admitting: Family Medicine

## 2021-10-28 DIAGNOSIS — L98429 Non-pressure chronic ulcer of back with unspecified severity: Secondary | ICD-10-CM

## 2021-10-28 NOTE — Telephone Encounter (Signed)
LVM of referral to [facility] by PCP and to take pt to ED if pain becomes unbearable.

## 2021-10-28 NOTE — Telephone Encounter (Signed)
Persistent painful  skin sore on each side of his butt crack".   Very painful to touch . Smaller than the little finger nail. Wife stated 'the pain in this area started after his chemo changed'. "He cannot sit because the lesions are painful and he moans in pain".I heard pt moaning in the background.   "Valtrex,  Hydrocodone , Nystatin have not helped the pain."  Wife stated some drainage noted on bandaid that was removed.   She will try Desitin again.  Please advise.  I told her it is not likely r/t new tx.

## 2021-10-28 NOTE — Telephone Encounter (Signed)
Did not appear infected at last visit.  Appeared more herpetic in nature, however HSV swab negative and no response to valtrex.  Tried nystatin but only for a couple of days.  Home health wound assessment ordered. Let her know unlikely related to chemo treatment.

## 2021-10-29 ENCOUNTER — Inpatient Hospital Stay: Payer: Medicare Other | Attending: Internal Medicine

## 2021-10-29 ENCOUNTER — Telehealth: Payer: Self-pay

## 2021-10-29 ENCOUNTER — Encounter: Payer: Self-pay | Admitting: Family Medicine

## 2021-10-29 ENCOUNTER — Other Ambulatory Visit: Payer: Self-pay

## 2021-10-29 DIAGNOSIS — Z95828 Presence of other vascular implants and grafts: Secondary | ICD-10-CM

## 2021-10-29 DIAGNOSIS — E860 Dehydration: Secondary | ICD-10-CM | POA: Diagnosis not present

## 2021-10-29 DIAGNOSIS — C3431 Malignant neoplasm of lower lobe, right bronchus or lung: Secondary | ICD-10-CM | POA: Diagnosis present

## 2021-10-29 DIAGNOSIS — C3411 Malignant neoplasm of upper lobe, right bronchus or lung: Secondary | ICD-10-CM | POA: Diagnosis present

## 2021-10-29 DIAGNOSIS — C787 Secondary malignant neoplasm of liver and intrahepatic bile duct: Secondary | ICD-10-CM | POA: Insufficient documentation

## 2021-10-29 LAB — CBC WITH DIFFERENTIAL (CANCER CENTER ONLY)
Abs Immature Granulocytes: 0.02 10*3/uL (ref 0.00–0.07)
Basophils Absolute: 0 10*3/uL (ref 0.0–0.1)
Basophils Relative: 1 %
Eosinophils Absolute: 0.1 10*3/uL (ref 0.0–0.5)
Eosinophils Relative: 2 %
HCT: 31.9 % — ABNORMAL LOW (ref 39.0–52.0)
Hemoglobin: 10.3 g/dL — ABNORMAL LOW (ref 13.0–17.0)
Immature Granulocytes: 0 %
Lymphocytes Relative: 11 %
Lymphs Abs: 0.6 10*3/uL — ABNORMAL LOW (ref 0.7–4.0)
MCH: 32 pg (ref 26.0–34.0)
MCHC: 32.3 g/dL (ref 30.0–36.0)
MCV: 99.1 fL (ref 80.0–100.0)
Monocytes Absolute: 0.4 10*3/uL (ref 0.1–1.0)
Monocytes Relative: 7 %
Neutro Abs: 4.5 10*3/uL (ref 1.7–7.7)
Neutrophils Relative %: 79 %
Platelet Count: 136 10*3/uL — ABNORMAL LOW (ref 150–400)
RBC: 3.22 MIL/uL — ABNORMAL LOW (ref 4.22–5.81)
RDW: 15.7 % — ABNORMAL HIGH (ref 11.5–15.5)
WBC Count: 5.6 10*3/uL (ref 4.0–10.5)
nRBC: 0 % (ref 0.0–0.2)

## 2021-10-29 LAB — CMP (CANCER CENTER ONLY)
ALT: 41 U/L (ref 0–44)
AST: 50 U/L — ABNORMAL HIGH (ref 15–41)
Albumin: 3.4 g/dL — ABNORMAL LOW (ref 3.5–5.0)
Alkaline Phosphatase: 263 U/L — ABNORMAL HIGH (ref 38–126)
Anion gap: 5 (ref 5–15)
BUN: 16 mg/dL (ref 8–23)
CO2: 31 mmol/L (ref 22–32)
Calcium: 8.8 mg/dL — ABNORMAL LOW (ref 8.9–10.3)
Chloride: 99 mmol/L (ref 98–111)
Creatinine: 0.87 mg/dL (ref 0.61–1.24)
GFR, Estimated: >60 mL/min (ref >60)
Glucose, Bld: 99 mg/dL (ref 70–99)
Potassium: 4.7 mmol/L (ref 3.5–5.1)
Sodium: 135 mmol/L (ref 135–145)
Total Bilirubin: 0.5 mg/dL (ref 0.3–1.2)
Total Protein: 6.7 g/dL (ref 6.5–8.1)

## 2021-10-29 MED ORDER — HEPARIN SOD (PORK) LOCK FLUSH 100 UNIT/ML IV SOLN
500.0000 [IU] | Freq: Once | INTRAVENOUS | Status: AC
  Administered 2021-10-29: 500 [IU]

## 2021-10-29 MED ORDER — SODIUM CHLORIDE 0.9% FLUSH
10.0000 mL | Freq: Once | INTRAVENOUS | Status: AC
  Administered 2021-10-29: 10 mL

## 2021-10-29 NOTE — Telephone Encounter (Signed)
Pts wife presented to the [facility] c/o ongoing sores in the pts buttock and wants to know if it is related to pts tx. Discussed with Cassandra, PA-C. Pts wife has been advised it is not related to treatment and per documentation, she was advised yesterday by Shauna Hugh, RN to take pt to ER if his pain becomes unbearable. She is aware that his PCP has already ordered wound care for him.

## 2021-10-31 ENCOUNTER — Telehealth: Payer: Self-pay | Admitting: Medical Oncology

## 2021-10-31 NOTE — Telephone Encounter (Signed)
Skin sores-Wife notified per [name] that his buttock sores "Did not appear infected at last visit.  Appeared more herpetic in nature, however HSV swab negative and no response to valtrex.  Tried nystatin but only for a couple of days.  Home health wound assessment ordered. Let her know unlikely related to chemo treatment."   Not taking Hydrocodone -it makes him unsteady and sleeps all the time. Takes tylenol but it does not touch the pain.  She said pt saw urologist today for frequency. Wife stated the Lidocaine ointment made it worse.      Pt does not have pain in the sores when he walks , "it is only when he puts pressure on it".  Home health  nurse came today and will see pt tomorrow or Friday.

## 2021-11-01 ENCOUNTER — Telehealth: Payer: Self-pay

## 2021-11-01 NOTE — Telephone Encounter (Signed)
Rcvd call from [facility] [place] [phone]) requesting VO for wound care.  2 x 8 weeks.   Returned call. VO's granted.

## 2021-11-04 ENCOUNTER — Encounter: Payer: Self-pay | Admitting: Family Medicine

## 2021-11-04 ENCOUNTER — Telehealth: Payer: Self-pay

## 2021-11-04 NOTE — Telephone Encounter (Signed)
Teddy Spike of [facility] [phone]):  Mrs. Fogleman is complaining of Mr. Freeman waking every hour at night for urination. Can Tamsulosin be switched from a night to AM medication? Mr. Boeckman is very uncomfortable/pain in his back end. Mrs. Baratta has stopped his use of hydrocodone stating he was unsteady and sleeping all day. Can he rotate Aleve/Ibuprofen with Tylenol 325mg ? Please advise.

## 2021-11-04 NOTE — Telephone Encounter (Signed)
Ok to change tamsulosin to morning medication.  Can rotate aleve/ibuprofen with tylenol as well to help with pain.

## 2021-11-05 ENCOUNTER — Inpatient Hospital Stay: Payer: Medicare Other

## 2021-11-05 ENCOUNTER — Ambulatory Visit: Payer: Medicare Other | Admitting: Internal Medicine

## 2021-11-05 ENCOUNTER — Other Ambulatory Visit: Payer: Self-pay

## 2021-11-05 ENCOUNTER — Ambulatory Visit: Payer: Medicare Other

## 2021-11-05 DIAGNOSIS — E860 Dehydration: Secondary | ICD-10-CM | POA: Diagnosis not present

## 2021-11-05 DIAGNOSIS — Z95828 Presence of other vascular implants and grafts: Secondary | ICD-10-CM

## 2021-11-05 DIAGNOSIS — C3411 Malignant neoplasm of upper lobe, right bronchus or lung: Secondary | ICD-10-CM

## 2021-11-05 LAB — CBC WITH DIFFERENTIAL (CANCER CENTER ONLY)
Abs Immature Granulocytes: 0.03 10*3/uL (ref 0.00–0.07)
Basophils Absolute: 0 10*3/uL (ref 0.0–0.1)
Basophils Relative: 1 %
Eosinophils Absolute: 0 10*3/uL (ref 0.0–0.5)
Eosinophils Relative: 1 %
HCT: 32.5 % — ABNORMAL LOW (ref 39.0–52.0)
Hemoglobin: 10.6 g/dL — ABNORMAL LOW (ref 13.0–17.0)
Immature Granulocytes: 1 %
Lymphocytes Relative: 17 %
Lymphs Abs: 0.7 10*3/uL (ref 0.7–4.0)
MCH: 32.1 pg (ref 26.0–34.0)
MCHC: 32.6 g/dL (ref 30.0–36.0)
MCV: 98.5 fL (ref 80.0–100.0)
Monocytes Absolute: 0.8 10*3/uL (ref 0.1–1.0)
Monocytes Relative: 19 %
Neutro Abs: 2.4 10*3/uL (ref 1.7–7.7)
Neutrophils Relative %: 61 %
Platelet Count: 171 10*3/uL (ref 150–400)
RBC: 3.3 MIL/uL — ABNORMAL LOW (ref 4.22–5.81)
RDW: 16.1 % — ABNORMAL HIGH (ref 11.5–15.5)
WBC Count: 3.9 10*3/uL — ABNORMAL LOW (ref 4.0–10.5)
nRBC: 0 % (ref 0.0–0.2)

## 2021-11-05 LAB — CMP (CANCER CENTER ONLY)
ALT: 51 U/L — ABNORMAL HIGH (ref 0–44)
AST: 70 U/L — ABNORMAL HIGH (ref 15–41)
Albumin: 3.4 g/dL — ABNORMAL LOW (ref 3.5–5.0)
Alkaline Phosphatase: 277 U/L — ABNORMAL HIGH (ref 38–126)
Anion gap: 5 (ref 5–15)
BUN: 12 mg/dL (ref 8–23)
CO2: 31 mmol/L (ref 22–32)
Calcium: 8.8 mg/dL — ABNORMAL LOW (ref 8.9–10.3)
Chloride: 100 mmol/L (ref 98–111)
Creatinine: 0.94 mg/dL (ref 0.61–1.24)
GFR, Estimated: >60 mL/min (ref >60)
Glucose, Bld: 106 mg/dL — ABNORMAL HIGH (ref 70–99)
Potassium: 4.5 mmol/L (ref 3.5–5.1)
Sodium: 136 mmol/L (ref 135–145)
Total Bilirubin: 0.5 mg/dL (ref 0.3–1.2)
Total Protein: 6.4 g/dL — ABNORMAL LOW (ref 6.5–8.1)

## 2021-11-05 MED ORDER — HEPARIN SOD (PORK) LOCK FLUSH 100 UNIT/ML IV SOLN
500.0000 [IU] | Freq: Once | INTRAVENOUS | Status: AC
  Administered 2021-11-05: 500 [IU]

## 2021-11-05 MED ORDER — SODIUM CHLORIDE 0.9% FLUSH
10.0000 mL | Freq: Once | INTRAVENOUS | Status: AC
  Administered 2021-11-05: 10 mL

## 2021-11-06 ENCOUNTER — Encounter: Payer: Self-pay | Admitting: Internal Medicine

## 2021-11-06 NOTE — Telephone Encounter (Signed)
Returned call to [employer]. Advised of Dr. Zigmund Daniel instructions.

## 2021-11-07 ENCOUNTER — Encounter: Payer: Self-pay | Admitting: Pulmonary Disease

## 2021-11-07 ENCOUNTER — Ambulatory Visit (INDEPENDENT_AMBULATORY_CARE_PROVIDER_SITE_OTHER): Payer: Medicare Other | Admitting: Pulmonary Disease

## 2021-11-07 VITALS — BP 92/58 | HR 78 | Ht 72.0 in | Wt 198.0 lb

## 2021-11-07 DIAGNOSIS — Z515 Encounter for palliative care: Secondary | ICD-10-CM

## 2021-11-07 DIAGNOSIS — C3411 Malignant neoplasm of upper lobe, right bronchus or lung: Secondary | ICD-10-CM

## 2021-11-07 DIAGNOSIS — C3491 Malignant neoplasm of unspecified part of right bronchus or lung: Secondary | ICD-10-CM

## 2021-11-07 DIAGNOSIS — R918 Other nonspecific abnormal finding of lung field: Secondary | ICD-10-CM

## 2021-11-07 MED ORDER — OXYCODONE HCL 5 MG PO TABS: 5.0000 mg | ORAL_TABLET | ORAL | 0 refills | Status: AC | PRN

## 2021-11-07 MED ORDER — OXYCODONE HCL 5 MG PO CAPS
5.0000 mg | ORAL_CAPSULE | Freq: Four times a day (QID) | ORAL | 0 refills | Status: DC | PRN
Start: 2021-11-07 — End: 2021-11-07

## 2021-11-07 NOTE — Patient Instructions (Signed)
Thank you for visiting Dr. Valeta Harms at [facility]. Today we recommend the following:  Call me if you need something.   Return if symptoms worsen or fail to improve.    Please do your part to reduce the spread of COVID-19.

## 2021-11-07 NOTE — Progress Notes (Signed)
Synopsis: Referred in June 2021 for upper lobe pulmonary nodule by [name], DO  Subjective:   PATIENT ID: Gerald [name] GENDER: male DOB: 08-16-1940, MRN: [id number]  Chief Complaint  Patient presents with   Follow-up    Follow-up: Bronch    This is an 81 year old gentleman with a past medical history of hypertension, longstanding tobacco abuse history.  Patient underwent CT chest completed at twin [facility] in [place].  There was a 13 mm right upper lobe spiculated pulmonary nodule concerning for malignancy.  Also found to have a moderate right-sided pleural effusion with lower lobe atelectasis and possible right lower lobe endobronchial mucous plugging versus endobronchial lesion.  Decision was made for referral to pulmonary for further evaluation.  This CAT scan was originally completed on 07/28/2019.  OV 09/07/2019: Patient only complains today of cough.  He has this regularly.  Unfortunately he is still smoking.  2 packs/day for greater than the past 40+ years.  Brother recently diagnosed with lung cancer.  Patient denies hemoptysis and has at least a 20 pound weight loss over the past few months.  OV 11/07/2021:Patient here today for follow-up from medical oncology.  I last saw him for repeat bronchoscopy with videobronchoscopy endobronchial ultrasound in December 2022.  Diagnosed with small cell carcinoma.  He been treated with chemo.  He had a couple of admissions to hospital for sepsis and pneumonia.  Most recent hospital admission was in April 2023 as well as another 1 in May 2023.  At another hospital admission for community-acquired pneumonia in June 2023.  Discharge summary from July reviewed by Dr. Cruzita Lederer he was treated for sepsis secondary to right lower lobe pneumonia.  Have been on antibiotics.  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  He was seen in the office on 10/15/2021 by Dr. Earlie Server.  Office note reviewed.   Being treated for extensive stage small cell T2 a, N2, M1 B disease.  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  Patient had worsening metastatic disease on CT imaging in June 2023.  He is having a significant amount of pain in his right side.  They have tried several things.  He had used hydrocodone in the past but it made him too confused.  They have been taking Tylenol as well as as needed Aleve.    Past Medical History:  Diagnosis Date   Bradycardia    Bursitis    Carotid artery disease (HCC)    COPD (chronic obstructive pulmonary disease) (HCC)    Coronary artery disease    CTO mid-dist CX (fills from left-to-left collaterals), 99% OM2, 30% ost-dist LM, 30% prox-mid LAD, 30% ost-mid CX, 99% prox RCA (too small for PCI), medical management 02/09/20   Hypercholesterolemia    Ischemic cardiomyopathy    Lung cancer ([place])    Lung nodule    nscl ca dx'd 08/2019   Psoriasis    Tobacco abuse    Wears glasses    reading     Family History  Problem Relation Age of Onset   Heart attack Father    Mesothelioma Sister 36   Lung cancer Brother      Past Surgical History:  Procedure Laterality Date   BRONCHIAL BIOPSY  09/27/2019   Procedure: BRONCHIAL BIOPSIES;  Surgeon: Garner Nash, DO;  Location: [facility];  Service: Pulmonary;;   BRONCHIAL BRUSHINGS  09/27/2019   Procedure: BRONCHIAL BRUSHINGS;  Surgeon: Garner Nash, DO;  Location: [place];  Service: Pulmonary;;   BRONCHIAL NEEDLE ASPIRATION BIOPSY  09/27/2019   Procedure: BRONCHIAL NEEDLE ASPIRATION BIOPSIES;  Surgeon: Garner Nash, DO;  Location: [place];  Service: Pulmonary;;   BRONCHIAL NEEDLE ASPIRATION BIOPSY  03/29/2021   Procedure: BRONCHIAL NEEDLE ASPIRATION BIOPSIES;  Surgeon: Garner Nash, DO;  Location: [place];  Service: Pulmonary;;   BRONCHIAL WASHINGS  09/27/2019   Procedure: BRONCHIAL WASHINGS;   Surgeon: Garner Nash, DO;  Location: [place];  Service: Pulmonary;;   BRONCHIAL WASHINGS  03/29/2021   Procedure: BRONCHIAL WASHINGS;  Surgeon: Garner Nash, DO;  Location: [place];  Service: Pulmonary;;   COLONOSCOPY     CRYOTHERAPY  09/27/2019   Procedure: Cydney Ok;  Surgeon: Garner Nash, DO;  Location: [place] [facility];  Service: Pulmonary;;   CRYOTHERAPY  03/29/2021   Procedure: CRYOTHERAPY;  Surgeon: Garner Nash, DO;  Location: [place] [facility];  Service: Pulmonary;;   FIDUCIAL MARKER PLACEMENT  09/27/2019   Procedure: FIDUCIAL MARKER PLACEMENT;  Surgeon: Garner Nash, DO;  Location: [place] [facility];  Service: Pulmonary;;   HEMOSTASIS CONTROL  09/27/2019   Procedure: HEMOSTASIS CONTROL;  Surgeon: Garner Nash, DO;  Location: [place];  Service: Pulmonary;;   HERNIA REPAIR     umbicial- no longer as a "belly button"   IR IMAGING GUIDED PORT INSERTION  05/21/2021   IR US GUIDE VASC ACCESS LEFT  05/21/2021   RIGHT/LEFT HEART CATH AND CORONARY ANGIOGRAPHY N/A 02/09/2020   Procedure: RIGHT/LEFT HEART CATH AND CORONARY ANGIOGRAPHY;  Surgeon: Burnell Blanks, MD;  Location: [place] [facility];  Service: Cardiovascular;  Laterality: N/A;   VIDEO BRONCHOSCOPY WITH ENDOBRONCHIAL NAVIGATION N/A 09/27/2019   Procedure: VIDEO BRONCHOSCOPY WITH ENDOBRONCHIAL NAVIGATION;  Surgeon: Garner Nash, DO;  Location: [place];  Service: Pulmonary;  Laterality: N/A;   VIDEO BRONCHOSCOPY WITH ENDOBRONCHIAL ULTRASOUND N/A 03/29/2021   Procedure: VIDEO BRONCHOSCOPY WITH ENDOBRONCHIAL ULTRASOUND;  Surgeon: Garner Nash, DO;  Location: [place];  Service: Pulmonary;  Laterality: N/A;    Social History   Socioeconomic History   Marital status: Married    Spouse name: Not on file   Number of children: Not on file   Years of education: Not on file   Highest education level: Not on file  Occupational History   Not on file  Tobacco Use   Smoking status:  Former    Packs/day: 2.00    Years: 62.00    Total pack years: 124.00    Types: Cigarettes    Quit date: 04/30/2020    Years since quitting: 1.5    Passive exposure: Past   Smokeless tobacco: Never   Tobacco comments:    Pt currently using nicotine patch. Quit smoking 2022  Vaping Use   Vaping Use: Never used  Substance and Sexual Activity   Alcohol use: Not Currently    Comment: rare   Drug use: Never   Sexual activity: Not Currently  Other Topics Concern   Not on file  Social History Narrative   Not on file   Social Determinants of Health   Financial Resource Strain: Not on file  Food Insecurity: Not on file  Transportation Needs: Not on file  Physical Activity: Not on file  Stress: Not on file  Social Connections: Not on file  Intimate Partner Violence: Not on file     Allergies  Allergen Reactions   Cosela [Trilaciclib]  Other (See Comments)    thrombophlebitis     Outpatient Medications Prior to Visit  Medication Sig Dispense Refill   albuterol (VENTOLIN HFA) 108 (90 Base) MCG/ACT inhaler Inhale 2 puffs into the lungs every 6 (six) hours as needed for wheezing or shortness of breath. 18 g 2   Fluticasone-Umeclidin-Vilant (TRELEGY ELLIPTA) 100-62.5-25 MCG/ACT AEPB Inhale 1 puff into the lungs daily.     AMBULATORY NON FORMULARY MEDICATION Please provide 4 wheel rolling walker with seat for [employer].  Diagnosis: R26.81 Gait instability 1 Device 0   aspirin EC 81 MG tablet Take 1 tablet (81 mg total) by mouth daily. Swallow whole. 90 tablet 3   clobetasol cream (TEMOVATE) 9.32 % Apply 1 application topically daily as needed (Psoriasis).     docusate sodium (COLACE) 100 MG capsule Take 200 mg by mouth daily.     HYDROcodone-acetaminophen (NORCO) 5-325 MG tablet Take 0.5-1 tablets by mouth every 6 (six) hours as needed for moderate pain. (Patient not taking: Reported on 11/07/2021) 20 tablet 0   hydrocortisone 1 % ointment Apply 1 application. topically 2 (two) times  daily. To the rash. Please follow-up with your dermatologist. (Patient not taking: Reported on 11/07/2021) 30 g 0   lidocaine (XYLOCAINE) 5 % ointment Apply 1 Application topically 3 (three) times daily as needed. (Patient not taking: Reported on 11/07/2021) 35.44 g 0   lidocaine-prilocaine (EMLA) cream Apply to the Port-A-Cath site 30-60-minute before chemotherapy. 30 g 1   Multiple Vitamins-Minerals (PRESERVISION AREDS 2 PO) Take 1 tablet by mouth in the morning and at bedtime.     niacinamide 500 MG tablet Take 500 mg by mouth daily with breakfast.     nystatin cream (MYCOSTATIN) Apply 1 Application topically 2 (two) times daily. (Patient not taking: Reported on 11/07/2021) 30 g 1   polyethylene glycol (MIRALAX) 17 g packet Take 17 g by mouth daily as needed for moderate constipation.     prochlorperazine (COMPAZINE) 10 MG tablet Take 1 tablet (10 mg total) by mouth every 6 (six) hours as needed. (Patient taking differently: Take 10 mg by mouth every 6 (six) hours as needed for vomiting or nausea.) 30 tablet 2   rosuvastatin (CRESTOR) 20 MG tablet Take 1 tablet (20 mg total) by mouth daily. 90 tablet 3   tamsulosin (FLOMAX) 0.4 MG CAPS capsule Take 0.4 mg by mouth every evening.     triamcinolone cream (KENALOG) 0.1 % Apply 1 application topically daily as needed (skin irritation.).     vitamin B-12 (CYANOCOBALAMIN) 500 MCG tablet Take 500 mcg by mouth every evening.     No facility-administered medications prior to visit.    Review of Systems  Constitutional:  Positive for weight loss. Negative for chills, fever and malaise/fatigue.  HENT:  Negative for hearing loss, sore throat and tinnitus.   Eyes:  Negative for blurred vision and double vision.  Respiratory:  Positive for shortness of breath. Negative for cough, hemoptysis, sputum production, wheezing and stridor.   Cardiovascular:  Negative for chest pain, palpitations, orthopnea, leg swelling and PND.  Gastrointestinal:  Positive for  abdominal pain. Negative for constipation, diarrhea, heartburn, nausea and vomiting.  Genitourinary:  Negative for dysuria, hematuria and urgency.  Musculoskeletal:  Negative for joint pain and myalgias.  Skin:  Negative for itching and rash.  Neurological:  Negative for dizziness, tingling, weakness and headaches.  Endo/Heme/Allergies:  Negative for environmental allergies. Does not bruise/bleed easily.  Psychiatric/Behavioral:  Negative for depression. The patient is not nervous/anxious and  does not have insomnia.   All other systems reviewed and are negative.    Objective:  Physical Exam Vitals reviewed.  Constitutional:      General: He is not in acute distress.    Appearance: He is well-developed.  HENT:     Head: Normocephalic and atraumatic.  Eyes:     General: No scleral icterus.    Conjunctiva/sclera: Conjunctivae normal.     Pupils: Pupils are equal, round, and reactive to light.  Neck:     Vascular: No JVD.     Trachea: No tracheal deviation.  Cardiovascular:     Rate and Rhythm: Normal rate and regular rhythm.     Heart sounds: Normal heart sounds. No murmur heard. Pulmonary:     Effort: Pulmonary effort is normal. No tachypnea, accessory muscle usage or respiratory distress.     Breath sounds: No stridor. No wheezing, rhonchi or rales.  Abdominal:     General: There is no distension.     Palpations: Abdomen is soft.     Tenderness: There is no abdominal tenderness.  Musculoskeletal:        General: No tenderness.     Cervical back: Neck supple.  Lymphadenopathy:     Cervical: No cervical adenopathy.  Skin:    General: Skin is warm and dry.     Capillary Refill: Capillary refill takes less than 2 seconds.     Findings: No rash.  Neurological:     Mental Status: He is alert and oriented to person, place, and time.  Psychiatric:        Behavior: Behavior normal.      Vitals:   11/07/21 1136  BP: (!) 92/58  Pulse: 78  SpO2: 93%  Weight: 198 lb (89.8  kg)  Height: 6' (1.829 m)   93% on RA BMI Readings from Last 3 Encounters:  11/07/21 26.85 kg/m  10/22/21 26.76 kg/m  10/18/21 26.73 kg/m   Wt Readings from Last 3 Encounters:  11/07/21 198 lb (89.8 kg)  10/22/21 197 lb 4.8 oz (89.5 kg)  10/18/21 197 lb 1.3 oz (89.4 kg)     CBC    Component Value Date/Time   WBC 3.9 (L) 11/05/2021 0935   WBC 8.0 10/02/2021 0437   RBC 3.30 (L) 11/05/2021 0935   HGB 10.6 (L) 11/05/2021 0935   HGB 16.9 02/07/2020 1208   HCT 32.5 (L) 11/05/2021 0935   HCT 49.1 02/07/2020 1208   PLT 171 11/05/2021 0935   PLT 185 02/07/2020 1208   MCV 98.5 11/05/2021 0935   MCV 94 02/07/2020 1208   MCH 32.1 11/05/2021 0935   MCHC 32.6 11/05/2021 0935   RDW 16.1 (H) 11/05/2021 0935   RDW 12.6 02/07/2020 1208   LYMPHSABS 0.7 11/05/2021 0935   MONOABS 0.8 11/05/2021 0935   EOSABS 0.0 11/05/2021 0935   BASOSABS 0.0 11/05/2021 0935     Chest Imaging: CT chest 07/25/2019: Completed at twin [facility]. Images reviewed on disc brought to the office today.  Patient has 30 mm right upper lobe pulmonary spiculated nodule concerning for primary bronchogenic carcinoma. There is also a moderate right-sided pleural effusion with right lower lobe atelectasis and possible right lower lobe endobronchial lesion versus mucous plugging. The patient's images have been independently reviewed by me.    Media Information   Document Information  Photos  Ct from [place] Right upper lobe pulmonary nodule   09/07/2019 14:13  Attached To:  Office Visit on 09/07/19 with June Leap  L, DO  Source Information  Garner Nash, DO  [facility]    The patient's images have been independently reviewed by me.     Pulmonary Functions Testing Results:     No data to display          FeNO: None   Pathology: none  Echocardiogram: none   Heart Catheterization: none     Assessment & Plan:     ICD-10-CM   1. Lung mass  R91.8     2.  Palliative care patient  Z51.5     3. 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 Endobronchial cancer, right ([place])  C34.91       Discussion:  This is an 81 year old gentleman longstanding history of tobacco abuse, 80+ pack year history, found to have a 13 mm right upper lobe pulmonary nodule diagnosed with a squamous cell carcinoma status post radiation treatments.  The this was a stage I and under surveillance imaging found to have new adenopathy within the chest.  Ultimately diagnosed with extensive stage small cell carcinoma 2 years later in 2022.  Patient is now on chemotherapy with Dr. Earlie Server.  Plan: He has repeat CT imaging next week already scheduled with Dr. Earlie Server. Having significant pain in the right side having trouble getting up and down also pain in the back. Will give him some pain medications, new prescription for oxycodone. He states that after the neck CT imaging there going to make some decisions whether not run to continue chemotherapy or not. At some point they understand the may need to consider transition to hospice care. They are hopeful that they have seen some response after the repeat CT imaging next week. Patient is going to follow-up with me as needed in the future. Or happy to help manage symptoms in the future if needed. Also happy to help with outpatient hospice care if they make that decision in the future as well.    Current Outpatient Medications:    albuterol (VENTOLIN HFA) 108 (90 Base) MCG/ACT inhaler, Inhale 2 puffs into the lungs every 6 (six) hours as needed for wheezing or shortness of breath., Disp: 18 g, Rfl: 2   Fluticasone-Umeclidin-Vilant (TRELEGY ELLIPTA) 100-62.5-25 MCG/ACT AEPB, Inhale 1 puff into the lungs daily., Disp: , Rfl:    oxycodone (OXY-IR) 5 MG capsule, Take 1 capsule (5 mg total) by mouth every 6 (six) hours as needed., Disp: 60 capsule, Rfl: 0   AMBULATORY NON FORMULARY MEDICATION, Please provide  4 wheel rolling walker with seat for [employer].  Diagnosis: R26.81 Gait instability, Disp: 1 Device, Rfl: 0   aspirin EC 81 MG tablet, Take 1 tablet (81 mg total) by mouth daily. Swallow whole., Disp: 90 tablet, Rfl: 3   clobetasol cream (TEMOVATE) 1.60 %, Apply 1 application topically daily as needed (Psoriasis)., Disp: , Rfl:    docusate sodium (COLACE) 100 MG capsule, Take 200 mg by mouth daily., Disp: , Rfl:    HYDROcodone-acetaminophen (NORCO) 5-325 MG tablet, Take 0.5-1 tablets by mouth every 6 (six) hours as needed for moderate pain. (Patient not taking: Reported on 11/07/2021), Disp: 20 tablet, Rfl: 0   hydrocortisone 1 % ointment, Apply 1 application. topically 2 (two) times daily. To the rash. Please follow-up with your dermatologist. (Patient not taking: Reported on 11/07/2021), Disp: 30 g, Rfl: 0   lidocaine (XYLOCAINE) 5 % ointment, Apply 1 Application topically 3 (three) times daily as needed. (Patient not taking:  Reported on 11/07/2021), Disp: 35.44 g, Rfl: 0   lidocaine-prilocaine (EMLA) cream, Apply to the Port-A-Cath site 30-60-minute before chemotherapy., Disp: 30 g, Rfl: 1   Multiple Vitamins-Minerals (PRESERVISION AREDS 2 PO), Take 1 tablet by mouth in the morning and at bedtime., Disp: , Rfl:    niacinamide 500 MG tablet, Take 500 mg by mouth daily with breakfast., Disp: , Rfl:    nystatin cream (MYCOSTATIN), Apply 1 Application topically 2 (two) times daily. (Patient not taking: Reported on 11/07/2021), Disp: 30 g, Rfl: 1   polyethylene glycol (MIRALAX) 17 g packet, Take 17 g by mouth daily as needed for moderate constipation., Disp: , Rfl:    prochlorperazine (COMPAZINE) 10 MG tablet, Take 1 tablet (10 mg total) by mouth every 6 (six) hours as needed. (Patient taking differently: Take 10 mg by mouth every 6 (six) hours as needed for vomiting or nausea.), Disp: 30 tablet, Rfl: 2   rosuvastatin (CRESTOR) 20 MG tablet, Take 1 tablet (20 mg total) by mouth daily., Disp: 90 tablet,  Rfl: 3   tamsulosin (FLOMAX) 0.4 MG CAPS capsule, Take 0.4 mg by mouth every evening., Disp: , Rfl:    triamcinolone cream (KENALOG) 0.1 %, Apply 1 application topically daily as needed (skin irritation.)., Disp: , Rfl:    vitamin B-12 (CYANOCOBALAMIN) 500 MCG tablet, Take 500 mcg by mouth every evening., Disp: , Rfl:    Garner Nash, DO [facility] 11/07/2021 12:05 PM

## 2021-11-07 NOTE — Progress Notes (Signed)
PCCM:  Called by [facility] and they do not have capsules. New prescription for tablets oxy 5mg  sent to pharmacy.   [facility] 11/07/2021 7:17 PM

## 2021-11-07 NOTE — Addendum Note (Signed)
Addended by: Garner Nash on: 11/07/2021 07:17 PM   Modules accepted: Orders

## 2021-11-08 ENCOUNTER — Telehealth: Payer: Self-pay | Admitting: Internal Medicine

## 2021-11-08 NOTE — Telephone Encounter (Signed)
Scheduled per 8/11 in basket, message has been left

## 2021-11-12 ENCOUNTER — Other Ambulatory Visit: Payer: Medicare Other

## 2021-11-12 ENCOUNTER — Inpatient Hospital Stay: Payer: Medicare Other

## 2021-11-12 ENCOUNTER — Inpatient Hospital Stay: Payer: Medicare Other | Admitting: Internal Medicine

## 2021-11-13 ENCOUNTER — Encounter (HOSPITAL_COMMUNITY): Payer: Self-pay

## 2021-11-13 ENCOUNTER — Ambulatory Visit (HOSPITAL_COMMUNITY)
Admission: RE | Admit: 2021-11-13 | Discharge: 2021-11-13 | Disposition: A | Discharge status: Home / Self Care | Payer: Medicare Other | Source: Ambulatory Visit | Attending: Internal Medicine | Admitting: Internal Medicine

## 2021-11-13 DIAGNOSIS — L89312 Pressure ulcer of right buttock, stage 2: Secondary | ICD-10-CM

## 2021-11-13 DIAGNOSIS — C3431 Malignant neoplasm of lower lobe, right bronchus or lung: Secondary | ICD-10-CM

## 2021-11-13 DIAGNOSIS — I2510 Atherosclerotic heart disease of native coronary artery without angina pectoris: Secondary | ICD-10-CM

## 2021-11-13 DIAGNOSIS — C3490 Malignant neoplasm of unspecified part of unspecified bronchus or lung: Secondary | ICD-10-CM | POA: Diagnosis present

## 2021-11-13 MED ORDER — SODIUM CHLORIDE (PF) 0.9 % IJ SOLN
INTRAMUSCULAR | Status: AC
  Filled 2021-11-13: qty 50

## 2021-11-13 MED ORDER — IOHEXOL 300 MG/ML  SOLN
100.0000 mL | Freq: Once | INTRAMUSCULAR | Status: AC | PRN
  Administered 2021-11-13: 100 mL via INTRAVENOUS

## 2021-11-14 NOTE — Progress Notes (Unsigned)
[facility] OFFICE PROGRESS NOTE  [name], DO [place]  [address] [place] [address]  DIAGNOSIS:  Extensive stage (T2 a, N2, M1b) small cell lung cancer that was initially diagnosed as synchronous stage Ia non-small cell lung cancer, squamous cell carcinoma involving right upper lobe pulmonary nodule in addition to right lower lobe endobronchial lesion diagnosed in June 2021.  The patient has evidence for disease recurrence and metastasis in December 2022  PRIOR THERAPY:  1) SBRT to the right upper lobe pulmonary nodule as well as the right lower lobe pulmonary nodule under the care of Dr. Sondra Come completed on 11/14/2019. 2) Palliative systemic chemotherapy with carboplatin for an AUC of 5, etoposide 100 mg/m2, and Imfinzi 1500 mg IV every 3 weeks with Cosela.  First dose expected on 04/16/21.  Status post 4 cycles. 3)  Zepzelca (lurbinectedin) 3.2 Mg/M2 every 3 weeks.  First dose July 16, 2021.  Status post 5 cycles. Dose reduced to 2.6 mg/m2 starting from cycle #2 due to pancytopenia with cycle #2.    CURRENT THERAPY:  Hospice   INTERVAL HISTORY: Gerald [name] 81 y.o. male returns to clinic today for a follow-up visit accompanied by his wife, son, and daughter.  The patient is being treated for extensive stage small cell lung cancer.  The patient had recurrent hospitalizations for Pseudomonas pneumonia. He follow-up with pulmonology outpatient.  He is currently on 2 L of supplemental oxygen.  He has also been seeing his PCP for right buttock pain.  There is a wound in this area which was negative for HSV.  Therefore, treatment with Valtrex was discontinued.  He then was given antifungal medication without significant improvement.  He was also referred to the home health wound care. He was given a prescription for hydrocodone which caused confusion.  Therefore Dr. Valeta Harms prescribed oxycodone which has also caused some confusion. His wife is concerned he is going  to fall.  The patient denies any recent falls, traumas, or injuries. He recently was found to have some fractures.   Since last being seen, the patient's been having some progressive fatigue and generalized weakness. He also has lost his appetite and lost about 10 lbs since last seen.  He is having a hard time getting in and out of bed/moving.  The patient sees urology and is on Flomax.  He is up and down a lot with urination at night and they are wondering what can be done for this.  He was checked by his urologist and it was negative for infection.  He denies any fever, chills, or night sweats.  His breathing "seems to be okay" but the patient is on 2 L of supplemental oxygen.  He has a cough which produces creamy/white phlegm.  The patient can start taking Mucinex again.  Denies any chest pain or hemoptysis. The patient recently had a restaging CT scan performed.  The patient is here today for evaluation to review his scan results and for more detailed discussion about his current condition and recommended treatment options.    MEDICAL HISTORY: Past Medical History:  Diagnosis Date   Bradycardia    Bursitis    Carotid artery disease (HCC)    COPD (chronic obstructive pulmonary disease) (HCC)    Coronary artery disease    CTO mid-dist CX (fills from left-to-left collaterals), 99% OM2, 30% ost-dist LM, 30% prox-mid LAD, 30% ost-mid CX, 99% prox RCA (too small for PCI), medical management 02/09/20   Hypercholesterolemia  Ischemic cardiomyopathy    Lung cancer ([facility])    Lung nodule    nscl ca dx'd 08/2019   Psoriasis    Tobacco abuse    Wears glasses    reading    ALLERGIES:  is allergic to cosela [trilaciclib].  MEDICATIONS:  Current Outpatient Medications  Medication Sig Dispense Refill   albuterol (VENTOLIN HFA) 108 (90 Base) MCG/ACT inhaler Inhale 2 puffs into the lungs every 6 (six) hours as needed for wheezing or shortness of breath. 18 g 2   AMBULATORY NON FORMULARY MEDICATION  Please provide 4 wheel rolling walker with seat for [employer].  Diagnosis: R26.81 Gait instability 1 Device 0   aspirin EC 81 MG tablet Take 1 tablet (81 mg total) by mouth daily. Swallow whole. 90 tablet 3   clobetasol cream (TEMOVATE) 6.29 % Apply 1 application topically daily as needed (Psoriasis).     docusate sodium (COLACE) 100 MG capsule Take 200 mg by mouth daily.     Fluticasone-Umeclidin-Vilant (TRELEGY ELLIPTA) 100-62.5-25 MCG/ACT AEPB Inhale 1 puff into the lungs daily.     HYDROcodone-acetaminophen (NORCO) 5-325 MG tablet Take 0.5-1 tablets by mouth every 6 (six) hours as needed for moderate pain. (Patient not taking: Reported on 11/07/2021) 20 tablet 0   hydrocortisone 1 % ointment Apply 1 application. topically 2 (two) times daily. To the rash. Please follow-up with your dermatologist. (Patient not taking: Reported on 11/07/2021) 30 g 0   lidocaine (XYLOCAINE) 5 % ointment Apply 1 Application topically 3 (three) times daily as needed. (Patient not taking: Reported on 11/07/2021) 35.44 g 0   lidocaine-prilocaine (EMLA) cream Apply to the Port-A-Cath site 30-60-minute before chemotherapy. 30 g 1   Multiple Vitamins-Minerals (PRESERVISION AREDS 2 PO) Take 1 tablet by mouth in the morning and at bedtime.     niacinamide 500 MG tablet Take 500 mg by mouth daily with breakfast.     nystatin cream (MYCOSTATIN) Apply 1 Application topically 2 (two) times daily. (Patient not taking: Reported on 11/07/2021) 30 g 1   oxyCODONE (OXY IR/ROXICODONE) 5 MG immediate release tablet Take 1 tablet (5 mg total) by mouth every 4 (four) hours as needed for severe pain. 45 tablet 0   polyethylene glycol (MIRALAX) 17 g packet Take 17 g by mouth daily as needed for moderate constipation.     prochlorperazine (COMPAZINE) 10 MG tablet Take 1 tablet (10 mg total) by mouth every 6 (six) hours as needed. (Patient taking differently: Take 10 mg by mouth every 6 (six) hours as needed for vomiting or nausea.) 30 tablet 2    rosuvastatin (CRESTOR) 20 MG tablet Take 1 tablet (20 mg total) by mouth daily. 90 tablet 3   tamsulosin (FLOMAX) 0.4 MG CAPS capsule Take 0.4 mg by mouth every evening.     triamcinolone cream (KENALOG) 0.1 % Apply 1 application topically daily as needed (skin irritation.).     vitamin B-12 (CYANOCOBALAMIN) 500 MCG tablet Take 500 mcg by mouth every evening.     No current facility-administered medications for this visit.    SURGICAL HISTORY:  Past Surgical History:  Procedure Laterality Date   BRONCHIAL BIOPSY  09/27/2019   Procedure: BRONCHIAL BIOPSIES;  Surgeon: Garner Nash, DO;  Location: [facility];  Service: Pulmonary;;   BRONCHIAL BRUSHINGS  09/27/2019   Procedure: BRONCHIAL BRUSHINGS;  Surgeon: Garner Nash, DO;  Location: [facility];  Service: Pulmonary;;   BRONCHIAL NEEDLE ASPIRATION BIOPSY  09/27/2019   Procedure: BRONCHIAL NEEDLE ASPIRATION BIOPSIES;  Surgeon:  Garner Nash, DO;  Location: [facility];  Service: Pulmonary;;   BRONCHIAL NEEDLE ASPIRATION BIOPSY  03/29/2021   Procedure: BRONCHIAL NEEDLE ASPIRATION BIOPSIES;  Surgeon: Garner Nash, DO;  Location: [facility];  Service: Pulmonary;;   BRONCHIAL WASHINGS  09/27/2019   Procedure: BRONCHIAL WASHINGS;  Surgeon: Garner Nash, DO;  Location: [facility];  Service: Pulmonary;;   BRONCHIAL WASHINGS  03/29/2021   Procedure: BRONCHIAL WASHINGS;  Surgeon: Garner Nash, DO;  Location: [facility];  Service: Pulmonary;;   COLONOSCOPY     CRYOTHERAPY  09/27/2019   Procedure: Cydney Ok;  Surgeon: Garner Nash, DO;  Location: [facility];  Service: Pulmonary;;   CRYOTHERAPY  03/29/2021   Procedure: CRYOTHERAPY;  Surgeon: Garner Nash, DO;  Location: [facility];  Service: Pulmonary;;   FIDUCIAL MARKER PLACEMENT  09/27/2019   Procedure: FIDUCIAL MARKER PLACEMENT;  Surgeon: Garner Nash, DO;  Location: [facility];  Service: Pulmonary;;   HEMOSTASIS CONTROL  09/27/2019    Procedure: HEMOSTASIS CONTROL;  Surgeon: Garner Nash, DO;  Location: [facility];  Service: Pulmonary;;   HERNIA REPAIR     umbicial- no longer as a "belly button"   IR IMAGING GUIDED PORT INSERTION  05/21/2021   IR US GUIDE VASC ACCESS LEFT  05/21/2021   RIGHT/LEFT HEART CATH AND CORONARY ANGIOGRAPHY N/A 02/09/2020   Procedure: RIGHT/LEFT HEART CATH AND CORONARY ANGIOGRAPHY;  Surgeon: Burnell Blanks, MD;  Location: [facility];  Service: Cardiovascular;  Laterality: N/A;   VIDEO BRONCHOSCOPY WITH ENDOBRONCHIAL NAVIGATION N/A 09/27/2019   Procedure: VIDEO BRONCHOSCOPY WITH ENDOBRONCHIAL NAVIGATION;  Surgeon: Garner Nash, DO;  Location: [facility];  Service: Pulmonary;  Laterality: N/A;   VIDEO BRONCHOSCOPY WITH ENDOBRONCHIAL ULTRASOUND N/A 03/29/2021   Procedure: VIDEO BRONCHOSCOPY WITH ENDOBRONCHIAL ULTRASOUND;  Surgeon: Garner Nash, DO;  Location: [facility];  Service: Pulmonary;  Laterality: N/A;    REVIEW OF SYSTEMS:   Review of Systems  Constitutional: Positive for fatigue, weight loss, decreased appetite.  Negative for chills and fever.  HENT: Negative for mouth sores, nosebleeds, sore throat and trouble swallowing.   Eyes: Negative for eye problems and icterus.  Respiratory: Positive for cough and shortness of breath.  Negative for hemoptysis and wheezing.   Cardiovascular: Negative for chest pain and leg swelling.  Gastrointestinal: Negative for abdominal pain, constipation, diarrhea, nausea and vomiting.  Genitourinary: Positive for nocturia.  Negative for bladder incontinence, difficulty urinating, dysuria, frequency and hematuria.   Musculoskeletal: Negative for back pain, gait problem, neck pain and neck stiffness.  Skin: Positive for psoriasis on abdomen. Neurological: Negative for dizziness, extremity weakness, gait problem, headaches, light-headedness and seizures.  Hematological: Negative for adenopathy.  Aside for bruising on upper  extremities. Psychiatric/Behavioral: Negative for confusion, depression and sleep disturbance. The patient is not nervous/anxious.     PHYSICAL EXAMINATION:  Blood pressure (!) 80/61, pulse 90, temperature 98.1 F (36.7 C), temperature source Tympanic, resp. rate 17, height 6' (1.829 m), weight 184 lb (83.5 kg), SpO2 95 %.  ECOG PERFORMANCE STATUS: 3  Physical Exam  Constitutional: Oriented to person, place, and time and well-developed, well-nourished, and in no distress. No distress.  HENT:  Head: Normocephalic and atraumatic.  Mouth/Throat: Oropharynx is clear and moist. No oropharyngeal exudate.  Eyes: Conjunctivae are normal. Right eye exhibits no discharge. Left eye exhibits no discharge. No scleral icterus.  Neck: Normal range of motion. Neck supple.  Cardiovascular: Normal rate, regular rhythm, normal heart sounds and intact distal pulses.  Pulmonary/Chest: Effort normal.  Right breath sounds in the right lung.  No respiratory distress. No wheezes. No rales.  Abdominal: Soft. Bowel sounds are normal. Exhibits no distension and no mass. There is no tenderness.  Musculoskeletal: Normal range of motion. Exhibits no edema.  Lymphadenopathy:    No cervical adenopathy.  Neurological: Alert and oriented to person, place, and time.  Exhibits muscle wasting.  The patient was examined in the wheelchair.  Skin: Skin is warm and dry. Not diaphoretic. No erythema. No pallor.  Positive for bruising on his upper extremities.  Positive for rash on his abdomen secondary to psoriasis. Psychiatric: Mood, memory and judgment normal.  Vitals reviewed.  LABORATORY DATA: Lab Results  Component Value Date   WBC 11.9 (H) 11/19/2021   HGB 10.9 (L) 11/19/2021   HCT 33.2 (L) 11/19/2021   MCV 98.5 11/19/2021   PLT 184 11/19/2021      Chemistry      Component Value Date/Time   NA 136 11/05/2021 0935   NA 139 02/07/2020 1208   K 4.5 11/05/2021 0935   CL 100 11/05/2021 0935   CO2 31 11/05/2021  0935   BUN 12 11/05/2021 0935   BUN 17 02/07/2020 1208   CREATININE 0.94 11/05/2021 0935      Component Value Date/Time   CALCIUM 8.8 (L) 11/05/2021 0935   ALKPHOS 277 (H) 11/05/2021 0935   AST 70 (H) 11/05/2021 0935   ALT 51 (H) 11/05/2021 0935   BILITOT 0.5 11/05/2021 0935       RADIOGRAPHIC STUDIES:  CT Abdomen Pelvis W Contrast  Result Date: 11/15/2021 CLINICAL DATA:  Small-cell lung cancer. Restaging. * Tracking Code: BO * EXAM: CT CHEST, ABDOMEN, AND PELVIS WITH CONTRAST TECHNIQUE: Multidetector CT imaging of the chest, abdomen and pelvis was performed following the standard protocol during bolus administration of intravenous contrast. RADIATION DOSE REDUCTION: This exam was performed according to the departmental dose-optimization program which includes automated exposure control, adjustment of the mA and/or kV according to patient size and/or use of iterative reconstruction technique. CONTRAST:  178mL OMNIPAQUE IOHEXOL 300 MG/ML  SOLN COMPARISON:  None Available. FINDINGS: CT CHEST FINDINGS Cardiovascular: The heart size is normal. No substantial pericardial effusion. Coronary artery calcification is evident. Moderate atherosclerotic calcification is noted in the wall of the thoracic aorta. Mediastinum/Nodes: 4.2 cm right paratracheal mass was 3.5 cm previously (remeasured). 3.9 x 2.8 cm soft tissue lesion along the anterior right mediastinum (29/2) was 3.8 x 2.4 cm previously (remeasured). 19 mm short axis right hilar lymph node was 20 mm previously (remeasured). The esophagus has normal imaging features. There is no axillary lymphadenopathy. 10 mm short axis right subpectoral node on 24/2 is similar to prior. Stable right thyroid nodule. Lungs/Pleura: Persistent volume loss right hemithorax with increasing consolidative opacity towards the right apex (see image 59/4. New 15 mm anterior right lung nodule on 74/4. Infrahilar collapse/consolidation is progressive in the interval. Persistent  rim enhancing right-sided pleural fluid collection is similar. No new suspicious pulmonary nodule or mass in the left lung. Musculoskeletal: No worrisome lytic or sclerotic osseous abnormality. CT ABDOMEN PELVIS FINDINGS Hepatobiliary: The number of metastatic lesions in the liver appears progressive in the interval. 2.2 x 1.9 cm posterior right liver lesion measured previously is 2.7 x 2.5 cm today. 2.6 cm lesion adjacent to the gallbladder fossa on 26/7 was 1.1 cm previously (remeasured). 1.6 x 1.5 cm lesion inferior tip right liver measured previously is 2.2 x 1.8 cm today (88/2). Layering tiny calcified gallstones  evident. No intrahepatic or extrahepatic biliary dilation. Pancreas: No focal mass lesion. No dilatation of the main duct. No intraparenchymal cyst. No peripancreatic edema. Spleen: No splenomegaly. No focal mass lesion. Adrenals/Urinary Tract: No adrenal nodule or mass. No hydronephrosis in the kidneys. No evidence for hydroureter. The urinary bladder appears normal for the degree of distention. Stomach/Bowel: Stomach is unremarkable. No gastric wall thickening. No evidence of outlet obstruction. Duodenum is normally positioned as is the ligament of Treitz. No small bowel wall thickening. No small bowel dilatation. The terminal ileum is normal. The appendix is normal. Diverticuli are seen scattered along the entire length of the colon without CT findings of diverticulitis. Vascular/Lymphatic: There is moderate atherosclerotic calcification of the abdominal aorta without aneurysm. 15 mm short axis aortocaval node measured previously is now 22 mm on image 62/2. 18 mm short axis hepatoduodenal ligament node on 64/2 was 10 mm previously (remeasured). No retroperitoneal lymphadenopathy. No pelvic sidewall lymphadenopathy. Reproductive: The prostate gland and seminal vesicles are unremarkable. Other: No intraperitoneal free fluid. Musculoskeletal: Right groin hernia contains only fat. No worrisome lytic or  sclerotic osseous abnormality. T7 compression fracture is new in the interval. Mild superior endplate compression deformity at L4 is also new since prior. IMPRESSION: 1. Overall generalized trend towards worsening disease. Mediastinal, right hilar, and right subpectoral metastatic disease is stable to mildly progressive. Enlarging hepatic metastases with new hepatic metastases evident and progression of hepatoduodenal ligament metastatic lymphadenopathy. 2. Progression of collapse/consolidative disease in the right lung with multiple new areas of nodular consolidative opacity including 15 mm anterior right lung nodule. 3. New T7 compression fracture with new mild superior endplate compression deformity at L4. 4. Cholelithiasis. 5. Colonic diverticulosis without diverticulitis. 6. Right groin hernia contains only fat. 7. Aortic Atherosclerosis (ICD10-I70.0). Electronically Signed   By: Misty Stanley M.D.   On: 11/15/2021 09:42   CT Chest W Contrast  Result Date: 11/15/2021 CLINICAL DATA:  Small-cell lung cancer. Restaging. * Tracking Code: BO * EXAM: CT CHEST, ABDOMEN, AND PELVIS WITH CONTRAST TECHNIQUE: Multidetector CT imaging of the chest, abdomen and pelvis was performed following the standard protocol during bolus administration of intravenous contrast. RADIATION DOSE REDUCTION: This exam was performed according to the departmental dose-optimization program which includes automated exposure control, adjustment of the mA and/or kV according to patient size and/or use of iterative reconstruction technique. CONTRAST:  119mL OMNIPAQUE IOHEXOL 300 MG/ML  SOLN COMPARISON:  None Available. FINDINGS: CT CHEST FINDINGS Cardiovascular: The heart size is normal. No substantial pericardial effusion. Coronary artery calcification is evident. Moderate atherosclerotic calcification is noted in the wall of the thoracic aorta. Mediastinum/Nodes: 4.2 cm right paratracheal mass was 3.5 cm previously (remeasured). 3.9 x 2.8 cm  soft tissue lesion along the anterior right mediastinum (29/2) was 3.8 x 2.4 cm previously (remeasured). 19 mm short axis right hilar lymph node was 20 mm previously (remeasured). The esophagus has normal imaging features. There is no axillary lymphadenopathy. 10 mm short axis right subpectoral node on 24/2 is similar to prior. Stable right thyroid nodule. Lungs/Pleura: Persistent volume loss right hemithorax with increasing consolidative opacity towards the right apex (see image 59/4. New 15 mm anterior right lung nodule on 74/4. Infrahilar collapse/consolidation is progressive in the interval. Persistent rim enhancing right-sided pleural fluid collection is similar. No new suspicious pulmonary nodule or mass in the left lung. Musculoskeletal: No worrisome lytic or sclerotic osseous abnormality. CT ABDOMEN PELVIS FINDINGS Hepatobiliary: The number of metastatic lesions in the liver appears progressive in the interval. 2.2  x 1.9 cm posterior right liver lesion measured previously is 2.7 x 2.5 cm today. 2.6 cm lesion adjacent to the gallbladder fossa on 26/7 was 1.1 cm previously (remeasured). 1.6 x 1.5 cm lesion inferior tip right liver measured previously is 2.2 x 1.8 cm today (88/2). Layering tiny calcified gallstones evident. No intrahepatic or extrahepatic biliary dilation. Pancreas: No focal mass lesion. No dilatation of the main duct. No intraparenchymal cyst. No peripancreatic edema. Spleen: No splenomegaly. No focal mass lesion. Adrenals/Urinary Tract: No adrenal nodule or mass. No hydronephrosis in the kidneys. No evidence for hydroureter. The urinary bladder appears normal for the degree of distention. Stomach/Bowel: Stomach is unremarkable. No gastric wall thickening. No evidence of outlet obstruction. Duodenum is normally positioned as is the ligament of Treitz. No small bowel wall thickening. No small bowel dilatation. The terminal ileum is normal. The appendix is normal. Diverticuli are seen scattered  along the entire length of the colon without CT findings of diverticulitis. Vascular/Lymphatic: There is moderate atherosclerotic calcification of the abdominal aorta without aneurysm. 15 mm short axis aortocaval node measured previously is now 22 mm on image 62/2. 18 mm short axis hepatoduodenal ligament node on 64/2 was 10 mm previously (remeasured). No retroperitoneal lymphadenopathy. No pelvic sidewall lymphadenopathy. Reproductive: The prostate gland and seminal vesicles are unremarkable. Other: No intraperitoneal free fluid. Musculoskeletal: Right groin hernia contains only fat. No worrisome lytic or sclerotic osseous abnormality. T7 compression fracture is new in the interval. Mild superior endplate compression deformity at L4 is also new since prior. IMPRESSION: 1. Overall generalized trend towards worsening disease. Mediastinal, right hilar, and right subpectoral metastatic disease is stable to mildly progressive. Enlarging hepatic metastases with new hepatic metastases evident and progression of hepatoduodenal ligament metastatic lymphadenopathy. 2. Progression of collapse/consolidative disease in the right lung with multiple new areas of nodular consolidative opacity including 15 mm anterior right lung nodule. 3. New T7 compression fracture with new mild superior endplate compression deformity at L4. 4. Cholelithiasis. 5. Colonic diverticulosis without diverticulitis. 6. Right groin hernia contains only fat. 7. Aortic Atherosclerosis (ICD10-I70.0). Electronically Signed   By: Misty Stanley M.D.   On: 11/15/2021 09:42     ASSESSMENT/PLAN:  This is a very pleasant 81 year old Caucasian male recently diagnosed with    1) extensive stage (T2 a, N2, M1 B) small cell lung cancer diagnosed in January 2022. Presented with new masslike area in the medial aspect of the right upper lobe with apparent invasion of the mediastinum and bulky right paratracheal lymphadenopathy in addition to liver lesions. The  patient underwent systemic chemotherapy with carboplatin for AUC of 5 on day 1, etoposide 100 Mg/M2 on days 1, 2 and 3 with Cosela 240 Mg/M2 before chemotherapy as well as Imfinzi 1500 Mg IV on day 1 every 3 weeks.  Status post 4 cycles. This was discontinued due to evidence for disease progression in the lung and liver.  The patient is currently undergoing second line treatment with Zepzelca (lurbinectedin) 3.2 Mg/M2 every 3 weeks status post 5 cycles.  He was hospitalized following cycle #1 for fever, weakness, neutropenia, and multifocal pneumonia.  Doses of cycle was reduced starting from cycle #2 at 2.6 mg/ metered square.  The patient tolerated cycle #2 and 3 much better.  He was hospitalized again in June 2023 for sepsis and Pseudomonas pneumonia.   The patient recently had a restaging CT scan performed.  Dr. Julien Nordmann personally and independently reviewed the scan and discussed the results with the patient today.  Overall, the trend shows worsening disease with increased mediastinal, right hilar, and subpectoral metastatic disease which is stable to mildly progressed.  The hepatic metastases are enlarging and progression of the hepatoduodenal ligament metastatic adenopathy.  There is also progressive collapse/consolidative disease in the right lung with multiple new areas of nodular consolidative opacity including the 15 mm anterior right lung nodule.  There is also a new T7 compression fracture with new mild superior endplate compression deformity at L4.  Dr. Julien Nordmann reviewed the results with the patient and his wife, daughter, and son today.  Dr. Julien Nordmann discussed overall progressive disease the lack of any good additional options, Dr. Julien Nordmann recommends recommend palliative care/hospice.  Patient and his family are in agreement with this.  I have placed a referral to [facility].   Due to the patient's hypotension, we will arrange for him to receive 1 L fluid over 2 hours today.  The patient's  family states that he is not eating or drinking.  Encouraged them to reach out to his urologist to consider catheter placement as the patient is frequently getting up and down the middle the night due to nocturia and there is concern about falling.  Discussed that the [facility] can adjust his pain medications as needed and help arrange for assistive devices if needed.  The patient was advised to call immediately if he has any concerning symptoms in the interval. The patient voices understanding of current disease status and treatment options and is in agreement with the current care plan. All questions were answered. The patient knows to call the clinic with any problems, questions or concerns. We can certainly see the patient much sooner if necessary  No orders of the defined types were placed in this encounter.     Nickolaus Bordelon L Mccall Lomax, PA-C 11/19/21  ADDENDUM: Hematology/Oncology Attending: I had a face-to-face encounter with the patient today.  I reviewed his record, lab, scan and recommended his care plan.  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  The patient is status post initial systemic chemotherapy with carboplatin, etoposide and Imfinzi discontinued secondary to disease progression.  He was then treated with second line treatment with Zepzelca (lurbinectedin) for 6 cycles.  He has been complaining of increasing fatigue and weakness with the second line treatment with Zepzelca (lurbinectedin) but he completed 6 cycles. He had repeat CT scan of the chest, abdomen and pelvis performed recently.  I personally and independently reviewed the scan images and discussed the result with the patient and his family. 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  The patient also has new T7 compression fracture. I had a lengthy discussion with the patient and his family today about his current condition and treatment options. Unfortunately his performance status and condition has deteriorated significantly and the patient may not be a good candidate for any additional systemic chemotherapy at this point.  I recommended for the patient and his family to consider palliative care and hospice at this point.  They are in agreement with the current plan.  I think his prognosis is very poor and average survival is probably around 6 weeks or less. For the dehydration, I will arrange for the patient to receive 1 L of normal saline in the clinic today. The patient was advised  to call if he has any concerning issues but we will see him on as-needed basis at this point and help with his hospice care. The total time spent in the appointment was 30 minutes. Disclaimer: This note was dictated with voice recognition software. Similar sounding words can inadvertently be transcribed and may be missed upon review. Eilleen Kempf, MD

## 2021-11-15 DIAGNOSIS — I2510 Atherosclerotic heart disease of native coronary artery without angina pectoris: Secondary | ICD-10-CM | POA: Diagnosis not present

## 2021-11-15 DIAGNOSIS — L89312 Pressure ulcer of right buttock, stage 2: Secondary | ICD-10-CM | POA: Diagnosis not present

## 2021-11-15 DIAGNOSIS — C3431 Malignant neoplasm of lower lobe, right bronchus or lung: Secondary | ICD-10-CM | POA: Diagnosis not present

## 2021-11-18 MED FILL — Dexamethasone Sodium Phosphate Inj 100 MG/10ML: INTRAMUSCULAR | Qty: 1 | Status: AC

## 2021-11-19 ENCOUNTER — Inpatient Hospital Stay: Payer: Medicare Other

## 2021-11-19 ENCOUNTER — Other Ambulatory Visit: Payer: Self-pay

## 2021-11-19 ENCOUNTER — Other Ambulatory Visit: Payer: Medicare Other

## 2021-11-19 ENCOUNTER — Inpatient Hospital Stay (HOSPITAL_BASED_OUTPATIENT_CLINIC_OR_DEPARTMENT_OTHER): Payer: Medicare Other | Admitting: Physician Assistant

## 2021-11-19 VITALS — BP 80/61 | HR 90 | Temp 98.1°F | Resp 17 | Ht 72.0 in | Wt 184.0 lb

## 2021-11-19 DIAGNOSIS — I959 Hypotension, unspecified: Secondary | ICD-10-CM

## 2021-11-19 DIAGNOSIS — Z7189 Other specified counseling: Secondary | ICD-10-CM

## 2021-11-19 DIAGNOSIS — C3411 Malignant neoplasm of upper lobe, right bronchus or lung: Secondary | ICD-10-CM

## 2021-11-19 DIAGNOSIS — Z95828 Presence of other vascular implants and grafts: Secondary | ICD-10-CM

## 2021-11-19 DIAGNOSIS — E860 Dehydration: Secondary | ICD-10-CM | POA: Diagnosis not present

## 2021-11-19 LAB — CMP (CANCER CENTER ONLY)
ALT: 92 U/L — ABNORMAL HIGH (ref 0–44)
AST: 175 U/L (ref 15–41)
Albumin: 3.3 g/dL — ABNORMAL LOW (ref 3.5–5.0)
Alkaline Phosphatase: 403 U/L — ABNORMAL HIGH (ref 38–126)
Anion gap: 9 (ref 5–15)
BUN: 17 mg/dL (ref 8–23)
CO2: 30 mmol/L (ref 22–32)
Calcium: 9.5 mg/dL (ref 8.9–10.3)
Chloride: 96 mmol/L — ABNORMAL LOW (ref 98–111)
Creatinine: 0.99 mg/dL (ref 0.61–1.24)
GFR, Estimated: >60 mL/min (ref >60)
Glucose, Bld: 113 mg/dL — ABNORMAL HIGH (ref 70–99)
Potassium: 4.8 mmol/L (ref 3.5–5.1)
Sodium: 135 mmol/L (ref 135–145)
Total Bilirubin: 1.5 mg/dL — ABNORMAL HIGH (ref 0.3–1.2)
Total Protein: 6.2 g/dL — ABNORMAL LOW (ref 6.5–8.1)

## 2021-11-19 LAB — CBC WITH DIFFERENTIAL (CANCER CENTER ONLY)
Abs Immature Granulocytes: 0.18 10*3/uL — ABNORMAL HIGH (ref 0.00–0.07)
Basophils Absolute: 0.1 10*3/uL (ref 0.0–0.1)
Basophils Relative: 0 %
Eosinophils Absolute: 0 10*3/uL (ref 0.0–0.5)
Eosinophils Relative: 0 %
HCT: 33.2 % — ABNORMAL LOW (ref 39.0–52.0)
Hemoglobin: 10.9 g/dL — ABNORMAL LOW (ref 13.0–17.0)
Immature Granulocytes: 2 %
Lymphocytes Relative: 4 %
Lymphs Abs: 0.5 10*3/uL — ABNORMAL LOW (ref 0.7–4.0)
MCH: 32.3 pg (ref 26.0–34.0)
MCHC: 32.8 g/dL (ref 30.0–36.0)
MCV: 98.5 fL (ref 80.0–100.0)
Monocytes Absolute: 1.3 10*3/uL — ABNORMAL HIGH (ref 0.1–1.0)
Monocytes Relative: 11 %
Neutro Abs: 9.8 10*3/uL — ABNORMAL HIGH (ref 1.7–7.7)
Neutrophils Relative %: 83 %
Platelet Count: 184 10*3/uL (ref 150–400)
RBC: 3.37 MIL/uL — ABNORMAL LOW (ref 4.22–5.81)
RDW: 16.6 % — ABNORMAL HIGH (ref 11.5–15.5)
WBC Count: 11.9 10*3/uL — ABNORMAL HIGH (ref 4.0–10.5)
nRBC: 0.2 % (ref 0.0–0.2)

## 2021-11-19 MED ORDER — SODIUM CHLORIDE 0.9% FLUSH
10.0000 mL | Freq: Once | INTRAVENOUS | Status: AC
  Administered 2021-11-19: 10 mL

## 2021-11-19 MED ORDER — SODIUM CHLORIDE 0.9 % IV SOLN: Freq: Once | INTRAVENOUS | Status: AC

## 2021-11-19 MED ORDER — HEPARIN SOD (PORK) LOCK FLUSH 100 UNIT/ML IV SOLN
500.0000 [IU] | Freq: Once | INTRAVENOUS | Status: AC
  Administered 2021-11-19: 500 [IU]

## 2021-11-20 ENCOUNTER — Encounter: Payer: Self-pay | Admitting: General Practice

## 2021-11-20 ENCOUNTER — Other Ambulatory Visit: Payer: Self-pay | Admitting: Physician Assistant

## 2021-11-22 ENCOUNTER — Ambulatory Visit (HOSPITAL_BASED_OUTPATIENT_CLINIC_OR_DEPARTMENT_OTHER): Payer: Medicare Other | Admitting: Cardiology

## 2021-11-26 ENCOUNTER — Ambulatory Visit: Payer: Medicare Other | Admitting: Physician Assistant

## 2021-11-26 ENCOUNTER — Ambulatory Visit: Payer: Medicare Other

## 2021-11-26 ENCOUNTER — Other Ambulatory Visit: Payer: Medicare Other

## 2021-11-29 DEATH — deceased

## 2021-12-03 ENCOUNTER — Other Ambulatory Visit: Payer: Medicare Other

## 2021-12-04 ENCOUNTER — Ambulatory Visit: Payer: Medicare Other

## 2021-12-04 ENCOUNTER — Other Ambulatory Visit: Payer: Medicare Other

## 2021-12-04 ENCOUNTER — Ambulatory Visit: Payer: Medicare Other | Admitting: Internal Medicine

## 2021-12-10 ENCOUNTER — Other Ambulatory Visit: Payer: Medicare Other

## 2021-12-11 ENCOUNTER — Ambulatory Visit: Payer: Medicare Other | Admitting: Internal Medicine

## 2021-12-11 ENCOUNTER — Ambulatory Visit: Payer: Medicare Other

## 2021-12-11 ENCOUNTER — Other Ambulatory Visit: Payer: Medicare Other

## 2022-02-12 ENCOUNTER — Ambulatory Visit: Payer: Medicare Other | Admitting: Family Medicine

## 2022-02-17 ENCOUNTER — Telehealth: Payer: Self-pay | Admitting: Family Medicine

## 2022-02-19 ENCOUNTER — Ambulatory Visit: Payer: Medicare Other | Admitting: Family Medicine

## 2022-02-19 NOTE — Telephone Encounter (Signed)
Fax came in on deceased patient  taken care of by physician

## 2022-03-19 ENCOUNTER — Ambulatory Visit: Payer: Medicare Other | Admitting: Family Medicine

## 2022-05-27 IMAGING — XA IR IMAGING GUIDED PORT INSERTION
1 series · 2 of 2 positions shown · non-contrast
Comparison: none

INDICATION: RIGHT lung cancer

[Series 1: ir fluoro/shunt/fist · 2 of 2 slices shown]
[im 1/2]
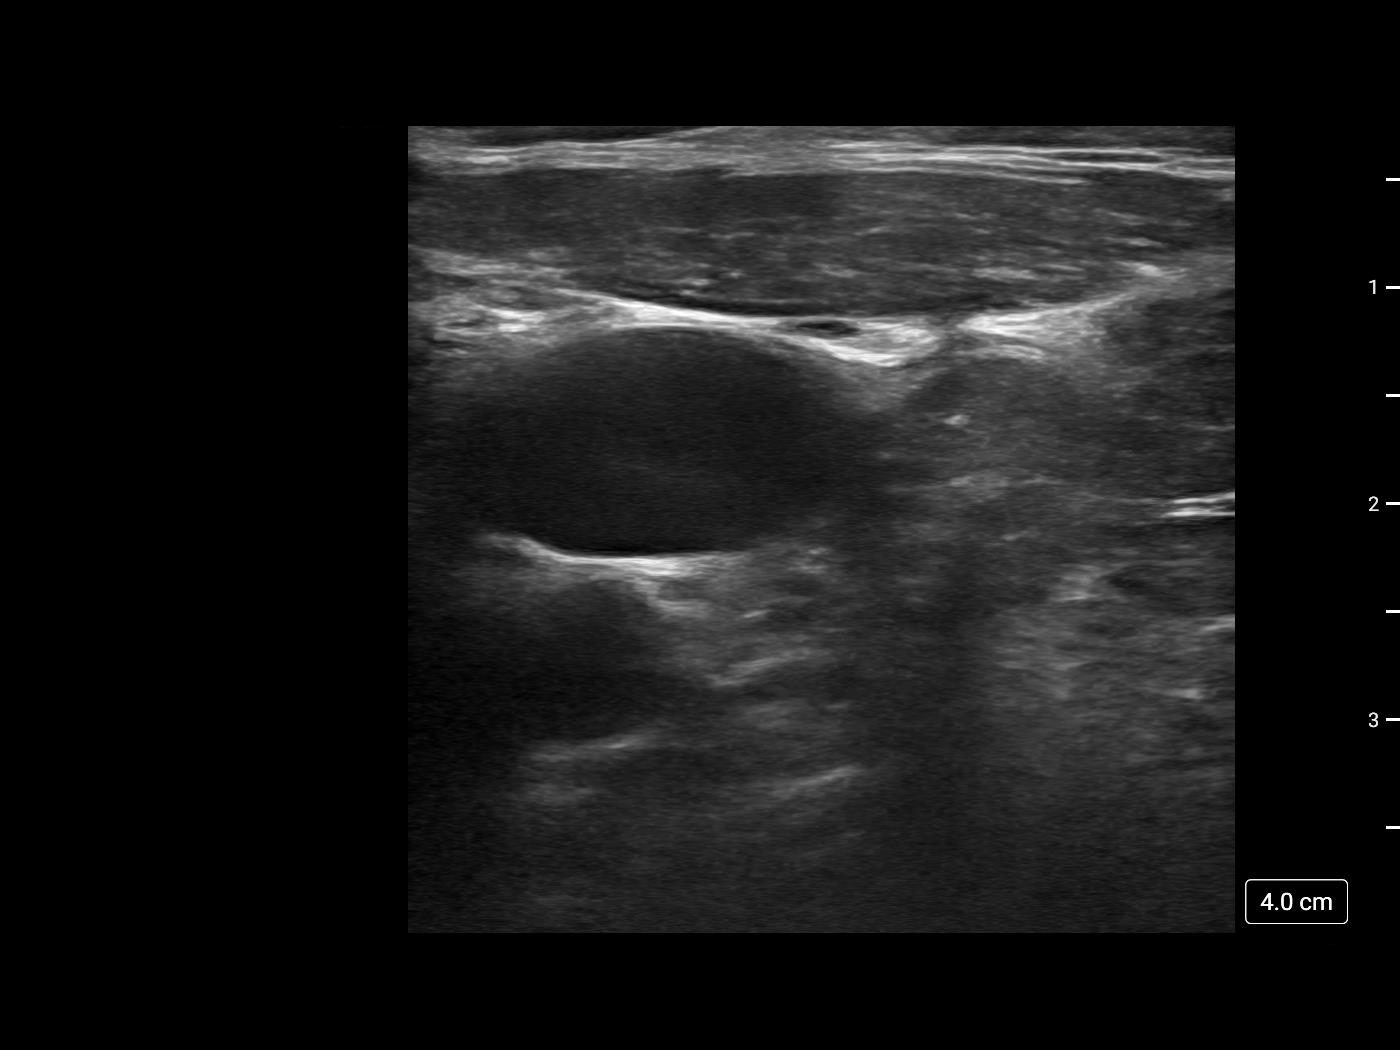
[im 2/2]
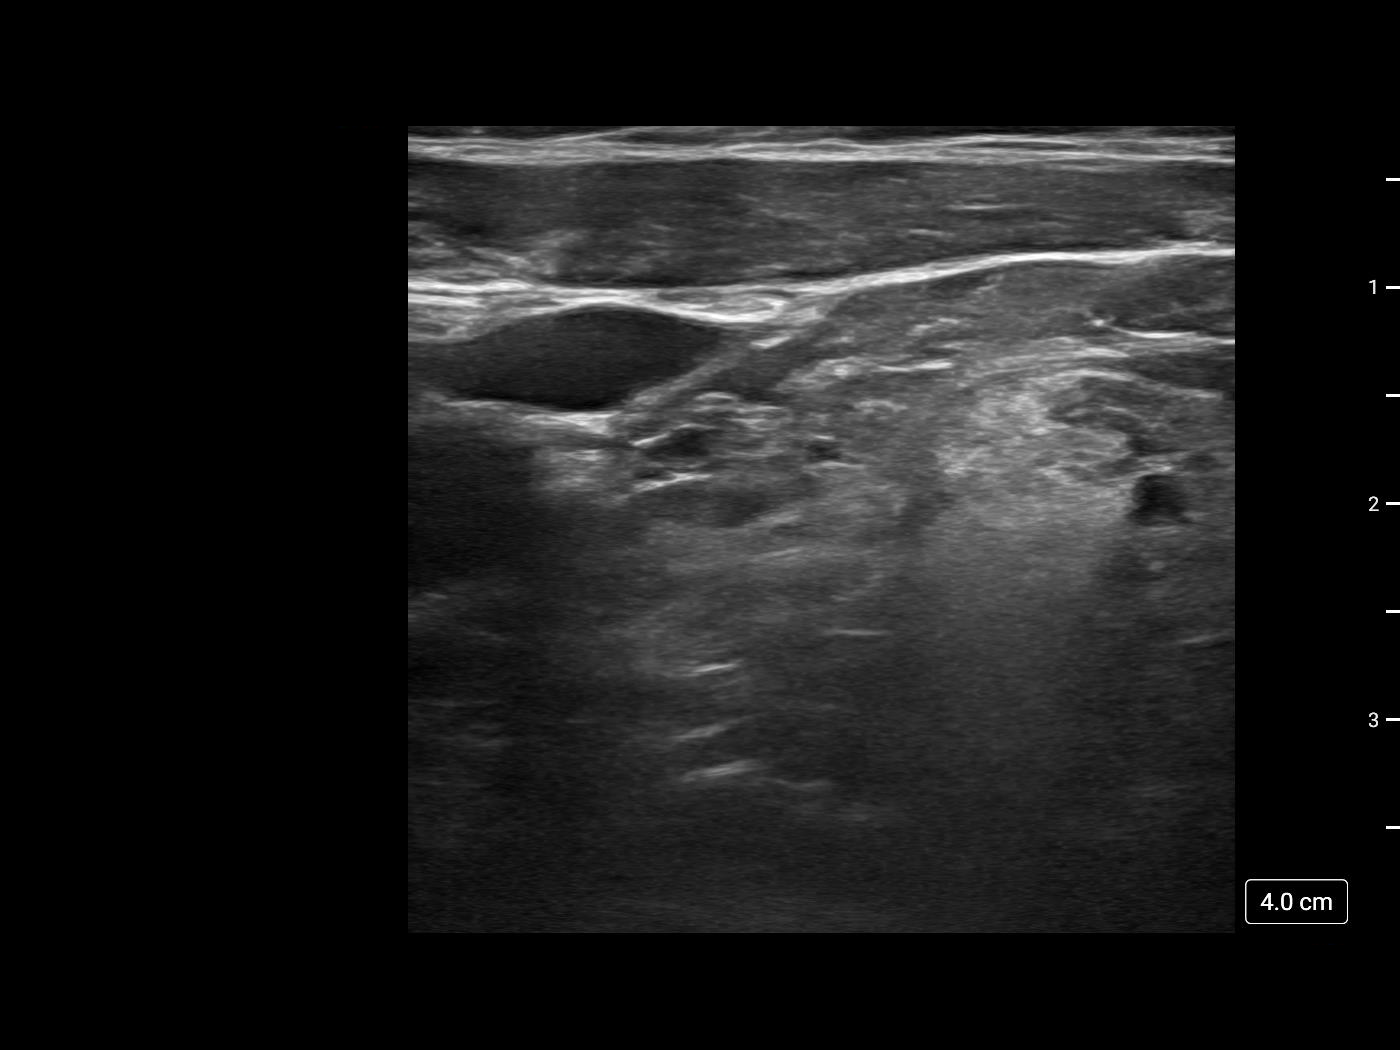

[2 of 2 positions shown; findings below may reference images not displayed]

EXAM:
IMPLANTED PORT A CATH PLACEMENT WITH ULTRASOUND AND FLUOROSCOPIC
GUIDANCE

MEDICATIONS:
None

ANESTHESIA/SEDATION:
Moderate (conscious) sedation was employed during this procedure. A
total of Versed 1 mg and Fentanyl 50 mcg was administered
intravenously.

Moderate Sedation Time: 24 minutes. The patient's level of
consciousness and vital signs were monitored continuously by
radiology nursing throughout the procedure under my direct
supervision.

FLUOROSCOPY TIME:  0 minutes, 30 seconds (1 mGy)

COMPLICATIONS:
None immediate.

PROCEDURE:
The procedure, risks, benefits, and alternatives were explained to
the patient. Questions regarding the procedure were encouraged and
answered. The patient understands and consents to the procedure.

The LEFT neck and chest were prepped with chlorhexidine in a sterile
fashion, and a sterile drape was applied covering the operative
field. Maximum barrier sterile technique with sterile gowns and
gloves were used for the procedure. A timeout was performed prior to
the initiation of the procedure. Local anesthesia was provided with
1% lidocaine with epinephrine.

After creating a small venotomy incision, a micropuncture kit was
utilized to access the internal jugular vein under direct, real-time
ultrasound guidance. Ultrasound image documentation was performed.
The microwire was kinked to measure appropriate catheter length.

A subcutaneous port pocket was then created along the upper chest
wall utilizing a combination of sharp and blunt dissection. The
pocket was irrigated with sterile saline. A single lumen ISP power
injectable port was chosen for placement. The 8 Fr catheter was
tunneled from the port pocket site to the venotomy incision. The
port was placed in the pocket. The external catheter was trimmed to
appropriate length. At the venotomy, an 8 Fr peel-away sheath was
placed over a guidewire under fluoroscopic guidance. The catheter
was then placed through the sheath and the sheath was removed. Final
catheter positioning was confirmed and documented with a
fluoroscopic spot radiograph. The port was accessed with Erlinda Fletes
needle, aspirated and flushed with heparinized saline.

The port pocket incision was closed with interrupted 3-0 Vicryl
suture then Dermabond was applied, including at the venotomy
incision. Dressings were placed. The patient tolerated the procedure
well without immediate post procedural complication.
IMPRESSION: Successful placement of a LEFT internal jugular approach power
injectable Port-A-Cath.

The tip of the catheter is positioned within the proximal RIGHT
atrium. The catheter is ready for immediate use.
